# Patient Record
Sex: Female | Born: 1935 | Race: White | Hispanic: No | Smoking: Never smoker
Health system: Southern US, Community
[De-identification: ages and names within clinical notes are randomized; demographics above are authoritative.]

## PROBLEM LIST (undated history)

## (undated) DIAGNOSIS — I4892 Unspecified atrial flutter: Secondary | ICD-10-CM

## (undated) DIAGNOSIS — I10 Essential (primary) hypertension: Secondary | ICD-10-CM

## (undated) DIAGNOSIS — D649 Anemia, unspecified: Secondary | ICD-10-CM

## (undated) DIAGNOSIS — K219 Gastro-esophageal reflux disease without esophagitis: Secondary | ICD-10-CM

## (undated) DIAGNOSIS — E7800 Pure hypercholesterolemia, unspecified: Secondary | ICD-10-CM

## (undated) DIAGNOSIS — I4891 Unspecified atrial fibrillation: Secondary | ICD-10-CM

## (undated) DIAGNOSIS — N6019 Diffuse cystic mastopathy of unspecified breast: Secondary | ICD-10-CM

## (undated) DIAGNOSIS — Z8619 Personal history of other infectious and parasitic diseases: Secondary | ICD-10-CM

## (undated) HISTORY — DX: Anemia, unspecified: D64.9

## (undated) HISTORY — PX: DILATION AND CURETTAGE OF UTERUS: SHX78

## (undated) HISTORY — DX: Personal history of other infectious and parasitic diseases: Z86.19

## (undated) HISTORY — DX: Pure hypercholesterolemia, unspecified: E78.00

## (undated) HISTORY — PX: CATARACT EXTRACTION: SUR2

## (undated) HISTORY — DX: Essential (primary) hypertension: I10

## (undated) HISTORY — PX: BREAST LUMPECTOMY: SHX2

## (undated) HISTORY — DX: Unspecified atrial flutter: I48.92

## (undated) HISTORY — PX: OTHER SURGICAL HISTORY: SHX169

## (undated) HISTORY — DX: Diffuse cystic mastopathy of unspecified breast: N60.19

---

## 2004-08-06 ENCOUNTER — Ambulatory Visit: Payer: Self-pay | Admitting: Internal Medicine

## 2004-10-23 ENCOUNTER — Ambulatory Visit: Payer: Self-pay

## 2004-10-23 LAB — HM COLONOSCOPY

## 2005-08-24 ENCOUNTER — Ambulatory Visit: Payer: Self-pay | Admitting: Internal Medicine

## 2006-09-06 ENCOUNTER — Ambulatory Visit: Payer: Self-pay | Admitting: Internal Medicine

## 2007-06-01 ENCOUNTER — Ambulatory Visit: Payer: Self-pay | Admitting: Internal Medicine

## 2007-10-03 ENCOUNTER — Ambulatory Visit: Payer: Self-pay | Admitting: Internal Medicine

## 2008-10-15 ENCOUNTER — Ambulatory Visit: Payer: Self-pay | Admitting: Internal Medicine

## 2009-10-22 ENCOUNTER — Ambulatory Visit: Payer: Self-pay | Admitting: Internal Medicine

## 2010-11-09 ENCOUNTER — Ambulatory Visit: Payer: Self-pay | Admitting: Internal Medicine

## 2010-11-09 LAB — HM MAMMOGRAPHY

## 2011-10-20 LAB — HM PAP SMEAR: HM Pap smear: NEGATIVE

## 2011-12-01 ENCOUNTER — Ambulatory Visit: Payer: Self-pay | Admitting: Internal Medicine

## 2012-02-07 LAB — BASIC METABOLIC PANEL
BUN: 21 mg/dL (ref 4–21)
Creatinine: 1 mg/dL (ref <=1.1)
Glucose: 95 mg/dL
Potassium: 4.5 mmol/L (ref 3.4–5.3)

## 2012-02-07 LAB — CBC AND DIFFERENTIAL
HCT: 39 % (ref 36–46)
Hemoglobin: 13.2 g/dL (ref 12.0–16.0)
Platelets: 218 10*3/uL (ref 150–399)
WBC: 3.1 10^3/mL

## 2012-02-07 LAB — LIPID PANEL
Cholesterol: 194 mg/dL (ref 0–200)
HDL: 69 mg/dL (ref 35–70)
LDL Cholesterol: 109 mg/dL
Triglycerides: 83 mg/dL (ref 40–160)

## 2012-10-04 HISTORY — PX: BREAST BIOPSY: SHX20

## 2012-10-25 ENCOUNTER — Encounter: Payer: Self-pay | Admitting: *Deleted

## 2012-10-27 ENCOUNTER — Ambulatory Visit (INDEPENDENT_AMBULATORY_CARE_PROVIDER_SITE_OTHER): Payer: Self-pay | Admitting: Internal Medicine

## 2012-10-27 ENCOUNTER — Encounter: Payer: Self-pay | Admitting: Internal Medicine

## 2012-10-27 ENCOUNTER — Other Ambulatory Visit (HOSPITAL_COMMUNITY)
Admission: RE | Admit: 2012-10-27 | Discharge: 2012-10-27 | Disposition: A | Discharge status: Home / Self Care | Payer: Medicare Other | Source: Ambulatory Visit | Attending: Internal Medicine | Admitting: Internal Medicine

## 2012-10-27 VITALS — BP 142/80 | HR 73 | Temp 97.8°F | Ht 66.5 in | Wt 126.0 lb

## 2012-10-27 DIAGNOSIS — G609 Hereditary and idiopathic neuropathy, unspecified: Secondary | ICD-10-CM

## 2012-10-27 DIAGNOSIS — Z01419 Encounter for gynecological examination (general) (routine) without abnormal findings: Secondary | ICD-10-CM | POA: Insufficient documentation

## 2012-10-27 DIAGNOSIS — E7800 Pure hypercholesterolemia, unspecified: Secondary | ICD-10-CM

## 2012-10-27 DIAGNOSIS — Z0000 Encounter for general adult medical examination without abnormal findings: Secondary | ICD-10-CM

## 2012-10-27 DIAGNOSIS — I10 Essential (primary) hypertension: Secondary | ICD-10-CM

## 2012-10-27 DIAGNOSIS — R5383 Other fatigue: Secondary | ICD-10-CM

## 2012-10-27 DIAGNOSIS — Z1151 Encounter for screening for human papillomavirus (HPV): Secondary | ICD-10-CM | POA: Insufficient documentation

## 2012-10-27 DIAGNOSIS — D72819 Decreased white blood cell count, unspecified: Secondary | ICD-10-CM

## 2012-10-27 DIAGNOSIS — R5381 Other malaise: Secondary | ICD-10-CM

## 2012-10-27 DIAGNOSIS — G629 Polyneuropathy, unspecified: Secondary | ICD-10-CM

## 2012-10-27 DIAGNOSIS — Z139 Encounter for screening, unspecified: Secondary | ICD-10-CM

## 2012-10-27 DIAGNOSIS — Z23 Encounter for immunization: Secondary | ICD-10-CM

## 2012-10-27 MED ORDER — TETANUS-DIPHTH-ACELL PERTUSSIS 5-2.5-18.5 LF-MCG/0.5 IM SUSP
0.5000 mL | Freq: Once | INTRAMUSCULAR | Status: AC
  Administered 2012-10-27: 0.5 mL via INTRAMUSCULAR

## 2012-10-29 ENCOUNTER — Encounter: Payer: Self-pay | Admitting: Internal Medicine

## 2012-10-29 DIAGNOSIS — D72819 Decreased white blood cell count, unspecified: Secondary | ICD-10-CM | POA: Insufficient documentation

## 2012-10-29 DIAGNOSIS — I10 Essential (primary) hypertension: Secondary | ICD-10-CM | POA: Insufficient documentation

## 2012-10-29 DIAGNOSIS — E7800 Pure hypercholesterolemia, unspecified: Secondary | ICD-10-CM | POA: Insufficient documentation

## 2012-10-29 NOTE — Assessment & Plan Note (Signed)
Low cholesterol diet and exercise.  Check lipid panel.   

## 2012-10-29 NOTE — Progress Notes (Signed)
Subjective:    Patient ID: Anita Benjamin, female    DOB: [birth year], 79 y.o.   MRN: [id number]  HPI 79 year old female with past history of hypercholesterolemia, FCD and hypertension who comes in today to follow up on these issues as well as for a complete physical exam.  She state she has been doing relatively well.  No cardiac symptoms with increased activity or exertion.  Breathing stable.  States if she stands for any length of time, she will notice some tingling - right top/lateraly aspect of her foot and ankle.  As soon as she moves, the symptoms resolve.  No weakness.  Does not bother her at any other time.  No injury or trauma.  Stays active.  Bowels stable.   Past Medical History  Diagnosis Date  . Hypercholesterolemia   . Hypertension   . Anemia   . Fibrocystic breast disease   . History of chicken pox     Current Outpatient Prescriptions on File Prior to Visit  Medication Sig Dispense Refill  . aspirin 81 MG tablet Take 81 mg by mouth daily.      . Calcium-Magnesium-Vitamin D (CALCIUM 500 PO) Take by mouth daily.      . irbesartan (AVAPRO) 150 MG tablet Take 1/2 pill daily        Review of Systems Patient denies any headache, lightheadedness or dizziness.  No sinus or allergy symptoms.  No chest pain, tightness or palpitations.  No increased shortness of breath, cough or congestion.  No nausea or vomiting.  No acid reflux.  No abdominal pain or cramping.  No bowel change, such as diarrhea, constipation, BRBPR or melana.  No urine change.  No leg pain.  No back pain.  Describes the tingling in her foot as outlined.  Not constant.       Objective:   Physical Exam Filed Vitals:   10/27/12 1105  BP: 142/80  Pulse: 73  Temp: 97.8 F (76.42 C)   79 year old female in no acute distress.   HEENT:  Nares- clear.  Oropharynx - without lesions. NECK:  Supple.  Nontender.  No audible bruit.  HEART:  Appears to be regular. LUNGS:  No crackles or wheezing audible.  Respirations even  and unlabored.  RADIAL PULSE:  Equal bilaterally.    BREASTS:  No nipple discharge or nipple retraction present.  Could not appreciate any distinct nodules or axillary adenopathy.  ABDOMEN:  Soft, nontender.  Bowel sounds present and normal.  No audible abdominal bruit.  GU:  Normal external genitalia.  Vaginal vault without lesions.  Cervix identified.  Pap performed. Could not appreciate any adnexal masses or tenderness.   RECTAL:  Heme negative.   EXTREMITIES:  No increased edema present.  DP pulses palpable and equal bilaterally.      NEURO:  Minimal decreased sensation right top of foot.   MSK:  No muscle weakness.       Assessment & Plan:  FOOT TINGLING.  Exam and symptoms as outlined.  Check B12.  She desires no further w/up or evaluation.  Follow.  Will notify me if any worsening.   PREVIOUS WEIGHT LOSS.  Weight up a couple of pounds today.  Follow.  124 pounds last visit.   DOCUMENTED HISTORY OF ABDOMINAL BRUIT.  Previous aortic ultrasound revealed no significant abnormality.  No aneurysm.  Follow.    GI.  Colonoscopoy 10/31/04 - two polyps removed.  Hyperplastic.  Recommended follow up colonoscopy 10 years.  IFOB today.  FATIGUE.  Did report some fatigue.  Overall doing well.  Check cbc, met c and tsh.   HEALTH MAINTENANCE.  Physical today.  Mammogram 12/01/11 - BiRADS II.  Schedule follow up mammogram.  Colonoscopy as outlined.  Tdap today.

## 2012-10-29 NOTE — Assessment & Plan Note (Signed)
Blood pressure checks outside of here under good control.  Same med regimen.  Check metabolic panel.

## 2012-10-29 NOTE — Assessment & Plan Note (Signed)
Stable.  ?Check cbc.  ?

## 2012-11-08 ENCOUNTER — Telehealth: Payer: Self-pay | Admitting: Internal Medicine

## 2012-11-08 ENCOUNTER — Other Ambulatory Visit (INDEPENDENT_AMBULATORY_CARE_PROVIDER_SITE_OTHER): Payer: 59

## 2012-11-08 DIAGNOSIS — R5381 Other malaise: Secondary | ICD-10-CM

## 2012-11-08 DIAGNOSIS — G609 Hereditary and idiopathic neuropathy, unspecified: Secondary | ICD-10-CM

## 2012-11-08 DIAGNOSIS — R5383 Other fatigue: Secondary | ICD-10-CM

## 2012-11-08 DIAGNOSIS — D72819 Decreased white blood cell count, unspecified: Secondary | ICD-10-CM

## 2012-11-08 DIAGNOSIS — E7800 Pure hypercholesterolemia, unspecified: Secondary | ICD-10-CM

## 2012-11-08 DIAGNOSIS — G629 Polyneuropathy, unspecified: Secondary | ICD-10-CM

## 2012-11-08 LAB — COMPREHENSIVE METABOLIC PANEL
ALT: 16 U/L (ref 0–35)
AST: 22 U/L (ref 0–37)
Albumin: 3.9 g/dL (ref 3.5–5.2)
Alkaline Phosphatase: 46 U/L (ref 39–117)
BUN: 14 mg/dL (ref 6–23)
CO2: 30 mEq/L (ref 19–32)
Calcium: 9.6 mg/dL (ref 8.4–10.5)
Chloride: 106 mEq/L (ref 96–112)
Creatinine, Ser: 1 mg/dL (ref 0.4–1.2)
GFR: 59.24 mL/min — ABNORMAL LOW (ref >60.00)
Glucose, Bld: 97 mg/dL (ref 70–99)
Potassium: 4.8 mEq/L (ref 3.5–5.1)
Sodium: 142 mEq/L (ref 135–145)
Total Bilirubin: 0.8 mg/dL (ref 0.3–1.2)
Total Protein: 6.7 g/dL (ref 6.0–8.3)

## 2012-11-08 LAB — CBC WITH DIFFERENTIAL/PLATELET
Basophils Absolute: 0 10*3/uL (ref 0.0–0.1)
Basophils Relative: 0.6 % (ref 0.0–3.0)
Eosinophils Absolute: 0.2 10*3/uL (ref 0.0–0.7)
Eosinophils Relative: 4.9 % (ref 0.0–5.0)
HCT: 39.4 % (ref 36.0–46.0)
Hemoglobin: 13.4 g/dL (ref 12.0–15.0)
Lymphocytes Relative: 27.7 % (ref 12.0–46.0)
Lymphs Abs: 1 10*3/uL (ref 0.7–4.0)
MCHC: 34 g/dL (ref 30.0–36.0)
MCV: 92.7 fl (ref 78.0–100.0)
Monocytes Absolute: 0.3 10*3/uL (ref 0.1–1.0)
Monocytes Relative: 8.7 % (ref 3.0–12.0)
Neutro Abs: 2.1 10*3/uL (ref 1.4–7.7)
Neutrophils Relative %: 58.1 % (ref 43.0–77.0)
Platelets: 220 10*3/uL (ref 150.0–400.0)
RBC: 4.25 Mil/uL (ref 3.87–5.11)
RDW: 13.4 % (ref 11.5–14.6)
WBC: 3.6 10*3/uL — ABNORMAL LOW (ref 4.5–10.5)

## 2012-11-08 LAB — TSH: TSH: 2.6 u[IU]/mL (ref 0.35–5.50)

## 2012-11-08 LAB — LIPID PANEL
Cholesterol: 197 mg/dL (ref 0–200)
HDL: 75 mg/dL (ref >39.00)
LDL Cholesterol: 106 mg/dL — ABNORMAL HIGH (ref 0–99)
Total CHOL/HDL Ratio: 3
Triglycerides: 78 mg/dL (ref 0.0–149.0)
VLDL: 15.6 mg/dL (ref 0.0–40.0)

## 2012-11-08 LAB — VITAMIN B12: Vitamin B-12: 652 pg/mL (ref 211–911)

## 2012-11-08 NOTE — Telephone Encounter (Signed)
Notify pt that I can work her in tomorrow at 1:30.  She will need to give a urine sample prior to being seen.  May have to wait a few minutes - being worked in.  If unable to wait until tomorrow - then acute care tonight.

## 2012-11-08 NOTE — Telephone Encounter (Signed)
Patient stated she has been feeling urgency and frequency to void but when she does go, she only voids a small amount. Also burns and has blood in her urine, please call patient back to advise.

## 2012-11-08 NOTE — Telephone Encounter (Signed)
Please advise 

## 2012-11-08 NOTE — Telephone Encounter (Signed)
Patient notified

## 2012-11-09 NOTE — Telephone Encounter (Signed)
Pt called back and is saying that her problem has resolved and she no longer needs to come today at 1:30pm. She does thank you for working her in.

## 2012-11-09 NOTE — Telephone Encounter (Signed)
Patient requested copy of labs, mailed labs to patient home address on file.

## 2012-12-01 ENCOUNTER — Ambulatory Visit: Payer: Self-pay | Admitting: Internal Medicine

## 2012-12-06 ENCOUNTER — Ambulatory Visit: Payer: Self-pay | Admitting: Internal Medicine

## 2012-12-07 ENCOUNTER — Telehealth: Payer: Self-pay | Admitting: Internal Medicine

## 2012-12-07 DIAGNOSIS — R928 Other abnormal and inconclusive findings on diagnostic imaging of breast: Secondary | ICD-10-CM

## 2012-12-07 NOTE — Telephone Encounter (Signed)
Please call Kristi over at [place]. Pt has Category 5 mammo and Selena Batten is needing to speak with you. Best number for [employer] [phone]

## 2012-12-10 NOTE — Telephone Encounter (Signed)
Pt notified of mammo results and need for surgery evaluation.  She is agreeable to see surgery.  Will order referral to Dr Lemar Livings.  Pt comfortable with this plan,

## 2012-12-19 ENCOUNTER — Ambulatory Visit (INDEPENDENT_AMBULATORY_CARE_PROVIDER_SITE_OTHER): Payer: 59 | Admitting: General Surgery

## 2012-12-19 DIAGNOSIS — Z789 Other specified health status: Secondary | ICD-10-CM

## 2012-12-19 DIAGNOSIS — Z87898 Personal history of other specified conditions: Secondary | ICD-10-CM

## 2012-12-19 NOTE — Progress Notes (Signed)
Subjective:     Patient ID: Anita Benjamin, female   DOB: 12/20/1933, 79 y.o.   MRN: [id number]  HPI   Review of Systems     Objective:   Physical Exam     Assessment:  Patient here today for follow up breast biopsy.  Dressing removed, steristrip in place and aware it may come off in one week.  Minimal bruising noted.  Aware to use heating pad for comfort as needed.  Aware of pathology. Follow up as scheduled in April.    Plan:

## 2012-12-29 ENCOUNTER — Encounter: Payer: Self-pay | Admitting: Internal Medicine

## 2013-01-16 ENCOUNTER — Ambulatory Visit: Payer: Self-pay | Admitting: General Surgery

## 2013-01-17 ENCOUNTER — Encounter: Payer: Self-pay | Admitting: General Surgery

## 2013-01-18 ENCOUNTER — Encounter: Payer: Self-pay | Admitting: General Surgery

## 2013-01-18 ENCOUNTER — Ambulatory Visit (INDEPENDENT_AMBULATORY_CARE_PROVIDER_SITE_OTHER): Payer: 59 | Admitting: General Surgery

## 2013-01-18 VITALS — BP 144/78 | HR 74 | Resp 14 | Ht 67.0 in | Wt 127.0 lb

## 2013-01-18 DIAGNOSIS — R928 Other abnormal and inconclusive findings on diagnostic imaging of breast: Secondary | ICD-10-CM

## 2013-01-18 DIAGNOSIS — R920 Mammographic microcalcification found on diagnostic imaging of breast: Secondary | ICD-10-CM

## 2013-01-18 NOTE — Progress Notes (Signed)
Patient ID: Anita Benjamin, female   DOB: Oct 25, 1933, 79 y.o.   MRN: [id number]  Chief Complaint  Patient presents with  . Breast Problem    HPI Anita Benjamin is a 79 y.o. female.  Patient here today for follow up mammogram.  No new breast complaints. The patient underwent an ultrasound-guided biopsy in March 2014 having had a mammogram rated category 5 and a ultrasound suggesting common cords with the mammographic lesion. The area was difficult to identify on ultrasound, and a post biopsy mammogram was recently completed. This shows the biopsy clip approximately 1 cm posterior to the index mass, and the mass itself unchanged. As it was a category 5 lesion and benign result, there is discordance of the patient was contacted by phone regarding the advisability of a stereotactic biopsy. She was accompanied by her husband. HPI  Past Medical History  Diagnosis Date  . Hypercholesterolemia   . Hypertension   . Anemia   . Fibrocystic breast disease   . History of chicken pox     Past Surgical History  Procedure Laterality Date  . Laser repair of torn retina      Family History  Problem Relation Age of Onset  . Heart disease Mother 60    Heart attack  . Cancer Father 8    lung cancer  . Breast cancer Paternal Aunt     x3  . Colon cancer Neg Hx     Social History History  Substance Use Topics  . Smoking status: Never Smoker   . Smokeless tobacco: Never Used  . Alcohol Use: No    Allergies  Allergen Reactions  . Oruvail (Ketoprofen)   . Relafen (Nabumetone)   . Timentin (Ticarcillin-Pot Clavulanate)     Current Outpatient Prescriptions  Medication Sig Dispense Refill  . aspirin 81 MG tablet Take 81 mg by mouth daily.      . Calcium-Magnesium-Vitamin D (CALCIUM 500 PO) Take by mouth daily.      . fish oil-omega-3 fatty acids 1000 MG capsule Take 2 g by mouth daily.      . Flaxseed, Linseed, (FLAX SEEDS PO) Take by mouth 2 (two) times daily.      . irbesartan (AVAPRO) 150 MG  tablet Take 1/2 pill daily      . Multiple Vitamin (MULTIVITAMIN) tablet Take 1 tablet by mouth daily.       No current facility-administered medications for this visit.    Review of Systems Review of Systems  Respiratory: Negative.   Cardiovascular: Negative.     Blood pressure 144/78, pulse 74, resp. rate 14, height 5\' 7"  (1.702 m), weight 127 lb (57.607 kg), last menstrual period 10/27/1989.  Physical Exam Physical Exam  Constitutional: She is oriented to person, place, and time. She appears well-developed and well-nourished.  Cardiovascular: Normal rate and regular rhythm.   Pulmonary/Chest: Effort normal and breath sounds normal. Right breast exhibits no inverted nipple, no mass, no nipple discharge, no skin change and no tenderness. Left breast exhibits no inverted nipple, no mass, no nipple discharge, no skin change and no tenderness.  Lymphadenopathy:    She has no cervical adenopathy.    She has no axillary adenopathy.  Neurological: She is alert and oriented to person, place, and time.  Skin: Skin is dry.    Data Reviewed Recently completed right breast mammogram was reviewed showing the biopsy clip posterior to the area of concern.  Assessment    Abnormal right breast mammogram.     Plan  Indication for repeat biopsy was discussed with the patient and her husband. She is vital to proceed. Is not necessary for her to discontinue her aspirin prior to the procedure.     Patient has been scheduled for a right breast stereotactic biopsy at [facility] for 01-22-13 at 4 pm. She is aware of date, time, and instructions.    Earline Mayotte 01/20/2013, 8:41 AM

## 2013-01-18 NOTE — Patient Instructions (Addendum)
Continue to do self breast exams Call for new questions or concerns The stereotactic procedure was reviewed with the patient. The potential for bleeding, infection and pain was reviewed. At this time, the benefits outweigh the risk, and the patient is amenable to proceed.  Patient has been scheduled for a right breast stereotactic biopsy at [facility] for 01-22-13 at 4 pm. She is aware of date, time, and instructions.

## 2013-01-20 ENCOUNTER — Encounter: Payer: Self-pay | Admitting: General Surgery

## 2013-01-20 DIAGNOSIS — R928 Other abnormal and inconclusive findings on diagnostic imaging of breast: Secondary | ICD-10-CM | POA: Insufficient documentation

## 2013-01-22 ENCOUNTER — Encounter: Payer: Self-pay | Admitting: *Deleted

## 2013-01-22 ENCOUNTER — Ambulatory Visit: Payer: Self-pay | Admitting: General Surgery

## 2013-01-22 DIAGNOSIS — R920 Mammographic microcalcification found on diagnostic imaging of breast: Secondary | ICD-10-CM

## 2013-01-23 LAB — PATHOLOGY REPORT

## 2013-01-24 ENCOUNTER — Encounter: Payer: Self-pay | Admitting: General Surgery

## 2013-01-24 ENCOUNTER — Telehealth: Payer: Self-pay | Admitting: General Surgery

## 2013-01-24 ENCOUNTER — Ambulatory Visit: Payer: Self-pay | Admitting: General Surgery

## 2013-01-24 NOTE — Telephone Encounter (Signed)
The patient reported minimal discomfort since her January 22, 2013 stereotactic biopsy for right breast abnormality.  She was notified of the pathology was entirely benign.  She will return next week for wound evaluation with the nurse.  We'll plan for a followup right breast mammogramexamination 6 months.

## 2013-01-24 NOTE — Progress Notes (Signed)
Quick Note:  The patient has been notified that the repeat right breast biopsy completed January 22, 2013 showed benign, atrophic breast tissue with fibrocystic changes, dense stroma and focal fat necrosis. No evidence of malignancy or atypia. I believe that there is concordance between the mammographic abnormality, post biopsy films and the pathology report.  The patient did not recall any history of trauma, but in the area of the upper outer quadrant of the breast as possible this could have occurred and not been so severe as to warrant specific attention to the injury.  The post biopsy mammogram obtained at that time showed the area in question removed on my review the films.  The patient will follow up with the nurse next week as originally scheduled for wound evaluation and will make arrangements for a repeat right mammogram in 6 months to establish a new baseline. ______

## 2013-01-30 ENCOUNTER — Other Ambulatory Visit: Payer: Self-pay | Admitting: *Deleted

## 2013-01-30 DIAGNOSIS — R920 Mammographic microcalcification found on diagnostic imaging of breast: Secondary | ICD-10-CM

## 2013-01-30 NOTE — Progress Notes (Signed)
The patient has been asked to return to the office in six months for a unilateral right breast diagnostic mammogram.

## 2013-01-31 ENCOUNTER — Ambulatory Visit (INDEPENDENT_AMBULATORY_CARE_PROVIDER_SITE_OTHER): Payer: 59 | Admitting: *Deleted

## 2013-01-31 DIAGNOSIS — R920 Mammographic microcalcification found on diagnostic imaging of breast: Secondary | ICD-10-CM

## 2013-01-31 NOTE — Patient Instructions (Addendum)
The patient is aware that a heating pad may be used for comfort as needed.  Aware of pathology. Follow up as scheduled 6 months.

## 2013-01-31 NOTE — Progress Notes (Signed)
Patient here today for follow up post right breast stero biopsy.  Steristrip in place and aware it may come off in one week.  Moderate bruising noted.  The patient is aware that a heating pad may be used for comfort as needed.  Aware of pathology. Follow up as scheduled.

## 2013-02-13 ENCOUNTER — Other Ambulatory Visit: Payer: Self-pay | Admitting: *Deleted

## 2013-02-13 MED ORDER — IRBESARTAN 150 MG PO TABS: 75.0000 mg | ORAL_TABLET | Freq: Every day | ORAL | Status: DC

## 2013-02-14 ENCOUNTER — Encounter: Payer: Self-pay | Admitting: General Surgery

## 2013-04-24 ENCOUNTER — Encounter: Payer: Self-pay | Admitting: Internal Medicine

## 2013-04-24 ENCOUNTER — Ambulatory Visit (INDEPENDENT_AMBULATORY_CARE_PROVIDER_SITE_OTHER): Payer: 59 | Admitting: Internal Medicine

## 2013-04-24 VITALS — BP 120/70 | HR 68 | Temp 98.5°F | Ht 67.0 in | Wt 126.8 lb

## 2013-04-24 DIAGNOSIS — D72819 Decreased white blood cell count, unspecified: Secondary | ICD-10-CM

## 2013-04-24 DIAGNOSIS — R928 Other abnormal and inconclusive findings on diagnostic imaging of breast: Secondary | ICD-10-CM

## 2013-04-24 DIAGNOSIS — E7800 Pure hypercholesterolemia, unspecified: Secondary | ICD-10-CM

## 2013-04-24 DIAGNOSIS — I10 Essential (primary) hypertension: Secondary | ICD-10-CM

## 2013-04-24 LAB — CBC WITH DIFFERENTIAL/PLATELET
Basophils Absolute: 0 10*3/uL (ref 0.0–0.1)
Basophils Relative: 0.9 % (ref 0.0–3.0)
Eosinophils Absolute: 0.2 10*3/uL (ref 0.0–0.7)
Eosinophils Relative: 3.7 % (ref 0.0–5.0)
HCT: 40.8 % (ref 36.0–46.0)
Hemoglobin: 13.8 g/dL (ref 12.0–15.0)
Lymphocytes Relative: 30.3 % (ref 12.0–46.0)
Lymphs Abs: 1.4 10*3/uL (ref 0.7–4.0)
MCHC: 33.9 g/dL (ref 30.0–36.0)
MCV: 94.3 fl (ref 78.0–100.0)
Monocytes Absolute: 0.4 10*3/uL (ref 0.1–1.0)
Monocytes Relative: 9.6 % (ref 3.0–12.0)
Neutro Abs: 2.5 10*3/uL (ref 1.4–7.7)
Neutrophils Relative %: 55.5 % (ref 43.0–77.0)
Platelets: 237 10*3/uL (ref 150.0–400.0)
RBC: 4.33 Mil/uL (ref 3.87–5.11)
RDW: 13.5 % (ref 11.5–14.6)
WBC: 4.5 10*3/uL (ref 4.5–10.5)

## 2013-04-24 NOTE — Progress Notes (Signed)
  Subjective:    Patient ID: Anita Benjamin, female    DOB: 1933/12/27, 79 y.o.   MRN: [id number]  HPI 79 year old female with past history of hypercholesterolemia, FCD and hypertension who comes in today for a scheduled follow up.  She state she has been doing relatively well.  No cardiac symptoms with increased activity or exertion.  Breathing stable.  Stays active.  No cardiac symptoms with increased activity or exertion.  Bowels stable.   Past Medical History  Diagnosis Date  . Hypercholesterolemia   . Hypertension   . Anemia   . Fibrocystic breast disease   . History of chicken pox     Current Outpatient Prescriptions on File Prior to Visit  Medication Sig Dispense Refill  . aspirin 81 MG tablet Take 81 mg by mouth daily.      . Calcium-Magnesium-Vitamin D (CALCIUM 500 PO) Take by mouth daily.      . fish oil-omega-3 fatty acids 1000 MG capsule Take 2 g by mouth daily.      . Flaxseed, Linseed, (FLAX SEEDS PO) Take by mouth 2 (two) times daily.      . irbesartan (AVAPRO) 150 MG tablet Take 0.5 tablets (75 mg total) by mouth daily.  30 tablet  2  . Multiple Vitamin (MULTIVITAMIN) tablet Take 1 tablet by mouth daily.       No current facility-administered medications on file prior to visit.    Review of Systems Patient denies any headache, lightheadedness or dizziness.  No sinus or allergy symptoms.  No chest pain, tightness or palpitations.  No increased shortness of breath, cough or congestion.  No nausea or vomiting.  No acid reflux.  No abdominal pain or cramping.  No bowel change, such as diarrhea, constipation, BRBPR or melana.  No urine change.      Objective:   Physical Exam  Filed Vitals:   04/24/13 0903  BP: 120/70  Pulse: 68  Temp: 98.5 F (42.20 C)   79 year old female in no acute distress.   HEENT:  Nares- clear.  Oropharynx - without lesions. NECK:  Supple.  Nontender.  No audible bruit.  HEART:  Appears to be regular. LUNGS:  No crackles or wheezing audible.   Respirations even and unlabored.  RADIAL PULSE:  Equal bilaterally.  ABDOMEN:  Soft, nontender.  Bowel sounds present and normal.  No audible abdominal bruit.   EXTREMITIES:  No increased edema present.  DP pulses palpable and equal bilaterally.        Assessment & Plan:  FOOT TINGLING.  Previously reported.  Stable.  Desires no further testing or intervention.    PREVIOUS WEIGHT LOSS.  Weight stable.    DOCUMENTED HISTORY OF ABDOMINAL BRUIT.  Previous aortic ultrasound revealed no significant abnormality.  No aneurysm.  Follow.    GI.  Colonoscopoy 10/31/04 - two polyps removed.  Hyperplastic.  Recommended follow up colonoscopy 10 years.  HEALTH MAINTENANCE.  Physical 10/27/12.  Mammogram 12/01/12 recommended f/u views.  F/u mammogram 12/06/12 - Birads 5.  Referred to Dr Lemar Livings.  S/p biopsy.  Benign.  Recommended f/u in 10/14.  Colonoscopy as outlined.

## 2013-04-26 ENCOUNTER — Telehealth: Payer: Self-pay | Admitting: Internal Medicine

## 2013-04-26 ENCOUNTER — Encounter: Payer: Self-pay | Admitting: Internal Medicine

## 2013-04-26 MED ORDER — IRBESARTAN 150 MG PO TABS: 75.0000 mg | ORAL_TABLET | Freq: Every day | ORAL | Status: DC

## 2013-04-26 NOTE — Assessment & Plan Note (Signed)
Had a category 5 mammogram.  Saw Dr Lemar Livings.  S/p biopsy.  Benign.  Planning to follow up with Dr Lemar Livings in 10/14.

## 2013-04-26 NOTE — Telephone Encounter (Signed)
rx sent in for avapro #90 with one refill

## 2013-04-26 NOTE — Assessment & Plan Note (Signed)
Stable.  ?Check cbc.  ?

## 2013-04-26 NOTE — Assessment & Plan Note (Signed)
Low cholesterol diet and exercise.  Follow lipid panel.   

## 2013-04-26 NOTE — Assessment & Plan Note (Signed)
Blood pressure stable.  Same med regimen.  Follow metabolic panel.   

## 2013-05-07 ENCOUNTER — Ambulatory Visit: Payer: Self-pay | Admitting: Ophthalmology

## 2013-05-14 ENCOUNTER — Ambulatory Visit: Payer: Self-pay | Admitting: Ophthalmology

## 2013-05-18 ENCOUNTER — Encounter: Payer: Self-pay | Admitting: Internal Medicine

## 2013-06-25 ENCOUNTER — Ambulatory Visit: Payer: Self-pay | Admitting: Ophthalmology

## 2013-06-27 ENCOUNTER — Ambulatory Visit (INDEPENDENT_AMBULATORY_CARE_PROVIDER_SITE_OTHER): Payer: Medicare Other | Admitting: *Deleted

## 2013-06-27 DIAGNOSIS — Z23 Encounter for immunization: Secondary | ICD-10-CM

## 2013-07-12 ENCOUNTER — Encounter: Payer: Self-pay | Admitting: General Surgery

## 2013-07-12 ENCOUNTER — Ambulatory Visit: Payer: Self-pay | Admitting: General Surgery

## 2013-07-31 ENCOUNTER — Ambulatory Visit (INDEPENDENT_AMBULATORY_CARE_PROVIDER_SITE_OTHER): Payer: 59 | Admitting: General Surgery

## 2013-07-31 ENCOUNTER — Encounter: Payer: Self-pay | Admitting: General Surgery

## 2013-07-31 VITALS — BP 110/64 | HR 72 | Resp 12 | Ht 67.0 in | Wt 125.0 lb

## 2013-07-31 DIAGNOSIS — R928 Other abnormal and inconclusive findings on diagnostic imaging of breast: Secondary | ICD-10-CM

## 2013-07-31 NOTE — Progress Notes (Signed)
Patient ID: Anita Benjamin, female   DOB: Dec 20, 1933, 79 y.o.   MRN: [id number]  Chief Complaint  Patient presents with  . Follow-up    mammogram    HPI Anita Benjamin is a 79 y.o. female who presents for a breast evaluation. The most recent right breast mammogram was done on 07/12/13 cat1 .  Patient does perform regular self breast checks and gets regular mammograms done.    HPI  Past Medical History  Diagnosis Date  . Hypercholesterolemia   . Hypertension   . Anemia   . Fibrocystic breast disease   . History of chicken pox     Past Surgical History  Procedure Laterality Date  . Laser repair of torn retina      Family History  Problem Relation Age of Onset  . Heart disease Mother 46    Heart attack  . Cancer Father 39    lung cancer  . Breast cancer Paternal Aunt     x3  . Colon cancer Neg Hx     Social History History  Substance Use Topics  . Smoking status: Never Smoker   . Smokeless tobacco: Never Used  . Alcohol Use: No    Allergies  Allergen Reactions  . Oruvail [Ketoprofen]   . Relafen [Nabumetone]   . Timentin [Ticarcillin-Pot Clavulanate]     Current Outpatient Prescriptions  Medication Sig Dispense Refill  . aspirin 81 MG tablet Take 81 mg by mouth daily.      . Calcium-Magnesium-Vitamin D (CALCIUM 500 PO) Take by mouth daily.      . fish oil-omega-3 fatty acids 1000 MG capsule Take 2 g by mouth daily.      . Flaxseed, Linseed, (FLAX SEEDS PO) Take by mouth 2 (two) times daily.      . irbesartan (AVAPRO) 150 MG tablet Take 0.5 tablets (75 mg total) by mouth daily.  90 tablet  1  . Multiple Vitamin (MULTIVITAMIN) tablet Take 1 tablet by mouth daily.       No current facility-administered medications for this visit.    Review of Systems Review of Systems  Constitutional: Negative.   Respiratory: Negative.   Cardiovascular: Negative.     Blood pressure 110/64, pulse 72, resp. rate 12, height 5\' 7"  (1.702 m), weight 125 lb (56.7 kg), last menstrual  period 10/27/1989.  Physical Exam Physical Exam  Constitutional: She is oriented to person, place, and time. She appears well-developed and well-nourished.  Eyes: No scleral icterus.  Cardiovascular: Normal rate, regular rhythm and normal heart sounds.   Pulmonary/Chest: Breath sounds normal. Right breast exhibits no inverted nipple, no mass, no nipple discharge, no skin change and no tenderness. Left breast exhibits no inverted nipple, no mass, no nipple discharge, no skin change and no tenderness. Breasts are asymmetrical (left breast than right).  Lymphadenopathy:    She has no cervical adenopathy.    She has no axillary adenopathy.  Neurological: She is alert and oriented to person, place, and time.  Skin: Skin is warm and dry.    Data Reviewed Right breast mammogram dated 07/04/2013 showed biopsy clip in the area of mammographic concern from her March 2014 biopsy.  The right breast biopsy dated December 12, 2012 showed only fibroadipose tissue with benign atrophic epithelium and sclerosing adenosis. No evidence of atypia or malignancy.   Assessment    Benign breast exam.     Plan    The patient will resume annual screening mammograms in spring 2015 with Dale Rossmoyne, M.D.  Earline Mayotte 07/31/2013, 8:37 PM

## 2013-07-31 NOTE — Patient Instructions (Addendum)
Patient to return as needed. 

## 2013-08-01 ENCOUNTER — Telehealth: Payer: Self-pay | Admitting: *Deleted

## 2013-08-01 NOTE — Telephone Encounter (Signed)
Pt received a bill for the Tdap inj & another injection that was given in January. She states that [facility] Medicare said that it should have been billed under her Rx plan. I notified the patient that only her pharmacy can bill for injections under her Rx plan. I asked if she checked her insurance prior to getting the Tdap & she states that she did not. Just an [place]

## 2013-08-01 NOTE — Telephone Encounter (Signed)
Any suggestions to help this pt - Anita Benjamin and I were not sure what to do.

## 2013-08-23 ENCOUNTER — Telehealth: Payer: Self-pay | Admitting: Internal Medicine

## 2013-08-23 NOTE — Telephone Encounter (Signed)
I spoke with patient and she is aware that if she pays for this out of pocket to send her insurance company that covers her RX along with the EOB that they should reimburse payment.

## 2013-08-23 NOTE — Telephone Encounter (Signed)
This is in reference to phone note on 08/01/13 that was closed. I sent an email to our billing team and they state the patient needs to do the following: I will call and advise patient.  From: Melvenia Beam [[email or web address]]  Sent: Tuesday, August 07, 2013 2:14 PM To: Cydney Ok Subject: RE: bill  IN this case the patient has an RX plan to cover certain immunizations/ drugs.  Typically the patient is to pay out of pocket for these services, then they are to supply the RX coverage with the EOB and statement( or the proper material to the RX Coverage) and the RX coverage will reimburse the patient.

## 2013-10-30 ENCOUNTER — Ambulatory Visit (INDEPENDENT_AMBULATORY_CARE_PROVIDER_SITE_OTHER): Payer: 59 | Admitting: Internal Medicine

## 2013-10-30 ENCOUNTER — Encounter: Payer: Self-pay | Admitting: Internal Medicine

## 2013-10-30 VITALS — BP 122/80 | HR 69 | Temp 97.9°F | Ht 67.0 in | Wt 126.5 lb

## 2013-10-30 DIAGNOSIS — D72819 Decreased white blood cell count, unspecified: Secondary | ICD-10-CM

## 2013-10-30 DIAGNOSIS — Z1211 Encounter for screening for malignant neoplasm of colon: Secondary | ICD-10-CM

## 2013-10-30 DIAGNOSIS — I10 Essential (primary) hypertension: Secondary | ICD-10-CM

## 2013-10-30 DIAGNOSIS — E7800 Pure hypercholesterolemia, unspecified: Secondary | ICD-10-CM

## 2013-10-30 DIAGNOSIS — R928 Other abnormal and inconclusive findings on diagnostic imaging of breast: Secondary | ICD-10-CM

## 2013-10-30 DIAGNOSIS — Z1239 Encounter for other screening for malignant neoplasm of breast: Secondary | ICD-10-CM

## 2013-10-30 NOTE — Progress Notes (Signed)
Pre-visit discussion using our clinic review tool. No additional management support is needed unless otherwise documented below in the visit note.  

## 2013-10-31 ENCOUNTER — Encounter: Payer: Self-pay | Admitting: Emergency Medicine

## 2013-11-03 ENCOUNTER — Encounter: Payer: Self-pay | Admitting: Internal Medicine

## 2013-11-03 NOTE — Assessment & Plan Note (Signed)
Stable.  ?Check cbc.  ?

## 2013-11-03 NOTE — Assessment & Plan Note (Signed)
Blood pressure stable.  Same med regimen.  Follow metabolic panel.   

## 2013-11-03 NOTE — Assessment & Plan Note (Signed)
Low cholesterol diet and exercise.  Follow lipid panel.   

## 2013-11-03 NOTE — Progress Notes (Signed)
Subjective:    Patient ID: Anita Benjamin, female    DOB: 07/18/1934, 80 y.o.   MRN: [id number]  HPI 80 year old female with past history of hypercholesterolemia, FCD and hypertension who comes in today to follow up on these issues as well as for a complete physical exam.   She state she has been doing relatively well.  No cardiac symptoms with increased activity or exertion.  Breathing stable.  Stays active.  No cardiac symptoms with increased activity or exertion.  Bowels stable.  Overall feels good.     Past Medical History  Diagnosis Date  . Hypercholesterolemia   . Hypertension   . Anemia   . Fibrocystic breast disease   . History of chicken pox     Current Outpatient Prescriptions on File Prior to Visit  Medication Sig Dispense Refill  . aspirin 81 MG tablet Take 81 mg by mouth daily.      . Calcium-Magnesium-Vitamin D (CALCIUM 500 PO) Take by mouth daily.      . fish oil-omega-3 fatty acids 1000 MG capsule Take 2 g by mouth daily.      . Flaxseed, Linseed, (FLAX SEEDS PO) Take by mouth 2 (two) times daily.      . irbesartan (AVAPRO) 150 MG tablet Take 0.5 tablets (75 mg total) by mouth daily.  90 tablet  1  . Multiple Vitamin (MULTIVITAMIN) tablet Take 1 tablet by mouth daily.       No current facility-administered medications on file prior to visit.    Review of Systems Patient denies any headache, lightheadedness or dizziness.  No sinus or allergy symptoms.  No chest pain, tightness or palpitations.  No increased shortness of breath, cough or congestion.  No nausea or vomiting.  No acid reflux.  No abdominal pain or cramping.  No bowel change, such as diarrhea, constipation, BRBPR or melana.  No urine change.  Overall feels good.       Objective:   Physical Exam  Filed Vitals:   10/30/13 1055  BP: 122/80  Pulse: 69  Temp: 97.9 F (35.13 C)   80 year old female in no acute distress.   HEENT:  Nares- clear.  Oropharynx - without lesions. NECK:  Supple.  Nontender.  No  audible bruit.  HEART:  Appears to be regular. LUNGS:  No crackles or wheezing audible.  Respirations even and unlabored.  RADIAL PULSE:  Equal bilaterally.    BREASTS:  No nipple discharge or nipple retraction present.  Could not appreciate any distinct nodules or axillary adenopathy.  ABDOMEN:  Soft, nontender.  Bowel sounds present and normal.  No audible abdominal bruit.  GU:  Not performed.    EXTREMITIES:  No increased edema present.  DP pulses palpable and equal bilaterally.          Assessment & Plan:  PREVIOUS WEIGHT LOSS.  Weight stable.    DOCUMENTED HISTORY OF ABDOMINAL BRUIT.  Previous aortic ultrasound revealed no significant abnormality.  No aneurysm.  Follow.    GI.  Colonoscopoy 10/31/04 - two polyps removed.  Hyperplastic.  Recommended follow up colonoscopy 10 years.  HEALTH MAINTENANCE.  Physical today.  Pap 10/27/12 negative with negative HPV.  Mammogram 12/01/12 recommended f/u views.  F/u mammogram 12/06/12 - Birads 5.  Referred to Dr Bary Castilla.  S/p biopsy.  Benign.  Recommended f/u in 10/14.  Follow up right breast mammo - Birads I.  Recommended f/u mammogram 2/15.  Schedule. Colonoscopy as outlined.   IFOB given.

## 2013-11-03 NOTE — Assessment & Plan Note (Signed)
Had a category 5 mammogram.  Saw Dr Bary Castilla.  S/p biopsy.  Benign.  Had follow up with Dr Bary Castilla in 10/14.  Right mammogram - Birads I.  Schedule bilateral mammogram in 2/15.

## 2013-11-06 ENCOUNTER — Other Ambulatory Visit (INDEPENDENT_AMBULATORY_CARE_PROVIDER_SITE_OTHER): Payer: Medicare Other

## 2013-11-06 ENCOUNTER — Telehealth: Payer: Self-pay | Admitting: *Deleted

## 2013-11-06 DIAGNOSIS — I10 Essential (primary) hypertension: Secondary | ICD-10-CM

## 2013-11-06 DIAGNOSIS — E7800 Pure hypercholesterolemia, unspecified: Secondary | ICD-10-CM

## 2013-11-06 DIAGNOSIS — D72819 Decreased white blood cell count, unspecified: Secondary | ICD-10-CM

## 2013-11-06 LAB — CBC WITH DIFFERENTIAL/PLATELET
Basophils Absolute: 0 10*3/uL (ref 0.0–0.1)
Basophils Relative: 0.8 % (ref 0.0–3.0)
Eosinophils Absolute: 0.1 10*3/uL (ref 0.0–0.7)
Eosinophils Relative: 2.9 % (ref 0.0–5.0)
HCT: 41.1 % (ref 36.0–46.0)
Hemoglobin: 13.6 g/dL (ref 12.0–15.0)
Lymphocytes Relative: 26.6 % (ref 12.0–46.0)
Lymphs Abs: 1 10*3/uL (ref 0.7–4.0)
MCHC: 33 g/dL (ref 30.0–36.0)
MCV: 95.9 fl (ref 78.0–100.0)
Monocytes Absolute: 0.3 10*3/uL (ref 0.1–1.0)
Monocytes Relative: 7 % (ref 3.0–12.0)
Neutro Abs: 2.4 10*3/uL (ref 1.4–7.7)
Neutrophils Relative %: 62.7 % (ref 43.0–77.0)
Platelets: 230 10*3/uL (ref 150.0–400.0)
RBC: 4.28 Mil/uL (ref 3.87–5.11)
RDW: 13.8 % (ref 11.5–14.6)
WBC: 3.9 10*3/uL — ABNORMAL LOW (ref 4.5–10.5)

## 2013-11-06 LAB — COMPREHENSIVE METABOLIC PANEL
ALT: 18 U/L (ref 0–35)
AST: 20 U/L (ref 0–37)
Albumin: 3.9 g/dL (ref 3.5–5.2)
Alkaline Phosphatase: 52 U/L (ref 39–117)
BUN: 15 mg/dL (ref 6–23)
CO2: 27 mEq/L (ref 19–32)
Calcium: 9.4 mg/dL (ref 8.4–10.5)
Chloride: 107 mEq/L (ref 96–112)
Creatinine, Ser: 0.9 mg/dL (ref 0.4–1.2)
GFR: 67.89 mL/min (ref >60.00)
Glucose, Bld: 90 mg/dL (ref 70–99)
Potassium: 4.1 mEq/L (ref 3.5–5.1)
Sodium: 141 mEq/L (ref 135–145)
Total Bilirubin: 0.8 mg/dL (ref 0.3–1.2)
Total Protein: 6.4 g/dL (ref 6.0–8.3)

## 2013-11-06 LAB — LIPID PANEL
Cholesterol: 211 mg/dL — ABNORMAL HIGH (ref 0–200)
HDL: 70.8 mg/dL (ref >39.00)
Total CHOL/HDL Ratio: 3
Triglycerides: 92 mg/dL (ref 0.0–149.0)
VLDL: 18.4 mg/dL (ref 0.0–40.0)

## 2013-11-06 LAB — TSH: TSH: 2.44 u[IU]/mL (ref 0.35–5.50)

## 2013-11-06 LAB — LDL CHOLESTEROL, DIRECT: Direct LDL: 117.7 mg/dL

## 2013-11-06 NOTE — Telephone Encounter (Signed)
Patient would like a copy of her lab results when they come back.

## 2013-11-07 ENCOUNTER — Encounter: Payer: Self-pay | Admitting: *Deleted

## 2013-11-07 NOTE — Telephone Encounter (Signed)
Labs mailed

## 2013-12-04 ENCOUNTER — Ambulatory Visit: Payer: Self-pay | Admitting: Internal Medicine

## 2013-12-04 LAB — HM MAMMOGRAPHY

## 2013-12-05 ENCOUNTER — Encounter: Payer: Self-pay | Admitting: Internal Medicine

## 2013-12-11 ENCOUNTER — Telehealth: Payer: Self-pay | Admitting: General Surgery

## 2013-12-11 NOTE — Telephone Encounter (Signed)
Yes, she should.

## 2013-12-11 NOTE — Telephone Encounter (Signed)
PATIENT CALLED STATED SHE HAD HER MAMMO(ORDERED BY DR Mullica [place] SCOTT/ANNUAL)ON 12-04-13 @ [facility] .THEY HAVE SCH'D HER FOR ADDED VIEWS (RT) ON 12-18-13.SHE WOULD LIKE TO KNOW IF SHE SHOULD HAVE THE ADDED VIEWS?

## 2013-12-18 ENCOUNTER — Ambulatory Visit: Payer: Self-pay | Admitting: Internal Medicine

## 2013-12-18 LAB — HM MAMMOGRAPHY

## 2013-12-19 ENCOUNTER — Encounter: Payer: Self-pay | Admitting: Internal Medicine

## 2013-12-27 ENCOUNTER — Other Ambulatory Visit (INDEPENDENT_AMBULATORY_CARE_PROVIDER_SITE_OTHER): Payer: 59

## 2013-12-27 DIAGNOSIS — Z1211 Encounter for screening for malignant neoplasm of colon: Secondary | ICD-10-CM

## 2013-12-27 LAB — FECAL OCCULT BLOOD, IMMUNOCHEMICAL: Fecal Occult Bld: NEGATIVE

## 2013-12-28 ENCOUNTER — Encounter: Payer: Self-pay | Admitting: *Deleted

## 2014-01-07 ENCOUNTER — Encounter: Payer: Self-pay | Admitting: Internal Medicine

## 2014-01-07 DIAGNOSIS — R928 Other abnormal and inconclusive findings on diagnostic imaging of breast: Secondary | ICD-10-CM

## 2014-03-04 ENCOUNTER — Encounter: Payer: Self-pay | Admitting: *Deleted

## 2014-03-04 ENCOUNTER — Encounter: Payer: Self-pay | Admitting: Internal Medicine

## 2014-03-04 ENCOUNTER — Ambulatory Visit (INDEPENDENT_AMBULATORY_CARE_PROVIDER_SITE_OTHER): Payer: Medicare Other | Admitting: Internal Medicine

## 2014-03-04 VITALS — BP 110/70 | HR 59 | Temp 98.5°F | Ht 67.0 in | Wt 125.8 lb

## 2014-03-04 DIAGNOSIS — D72819 Decreased white blood cell count, unspecified: Secondary | ICD-10-CM

## 2014-03-04 DIAGNOSIS — R432 Parageusia: Secondary | ICD-10-CM

## 2014-03-04 DIAGNOSIS — I10 Essential (primary) hypertension: Secondary | ICD-10-CM

## 2014-03-04 DIAGNOSIS — R928 Other abnormal and inconclusive findings on diagnostic imaging of breast: Secondary | ICD-10-CM

## 2014-03-04 DIAGNOSIS — Z86010 Personal history of colonic polyps: Secondary | ICD-10-CM

## 2014-03-04 DIAGNOSIS — R439 Unspecified disturbances of smell and taste: Secondary | ICD-10-CM

## 2014-03-04 DIAGNOSIS — E7800 Pure hypercholesterolemia, unspecified: Secondary | ICD-10-CM

## 2014-03-04 LAB — BASIC METABOLIC PANEL
BUN: 18 mg/dL (ref 6–23)
CO2: 30 mEq/L (ref 19–32)
Calcium: 10.1 mg/dL (ref 8.4–10.5)
Chloride: 104 mEq/L (ref 96–112)
Creatinine, Ser: 0.9 mg/dL (ref 0.4–1.2)
GFR: 61.21 mL/min (ref >60.00)
Glucose, Bld: 91 mg/dL (ref 70–99)
Potassium: 4.8 mEq/L (ref 3.5–5.1)
Sodium: 140 mEq/L (ref 135–145)

## 2014-03-04 MED ORDER — IRBESARTAN 150 MG PO TABS: 75.0000 mg | ORAL_TABLET | Freq: Every day | ORAL | Status: DC

## 2014-03-04 NOTE — Progress Notes (Signed)
Pre visit review using our clinic review tool, if applicable. No additional management support is needed unless otherwise documented below in the visit note. 

## 2014-03-09 ENCOUNTER — Encounter: Payer: Self-pay | Admitting: Internal Medicine

## 2014-03-09 DIAGNOSIS — R432 Parageusia: Secondary | ICD-10-CM | POA: Insufficient documentation

## 2014-03-09 DIAGNOSIS — Z86010 Personal history of colonic polyps: Secondary | ICD-10-CM | POA: Insufficient documentation

## 2014-03-09 NOTE — Assessment & Plan Note (Signed)
No sinus issues.  Wants salty foods.  Declines any further testing, evaluation or ENT referral.

## 2014-03-09 NOTE — Progress Notes (Signed)
  Subjective:    Patient ID: Anita Benjamin, female    DOB: [birth year], 80 y.o.   MRN: [id number]  HPI 80 year old female with past history of hypercholesterolemia, FCD and hypertension who comes in today for a scheduled follow up.  She state she has been doing relatively well.  No cardiac symptoms with increased activity or exertion.  Breathing stable.  Stays active.   Bowels stable.  Overall feels good.  She has noticed some decreased taste sensation.  Wants more salty foods.  No sinus issues.  No headache.  No dizziness.  No reflux.     Past Medical History  Diagnosis Date  . Hypercholesterolemia   . Hypertension   . Anemia   . Fibrocystic breast disease   . History of chicken pox     Current Outpatient Prescriptions on File Prior to Visit  Medication Sig Dispense Refill  . aspirin 81 MG tablet Take 81 mg by mouth daily.      . Calcium-Magnesium-Vitamin D (CALCIUM 500 PO) Take by mouth daily.      . fish oil-omega-3 fatty acids 1000 MG capsule Take 2 g by mouth daily.      . Flaxseed, Linseed, (FLAX SEEDS PO) Take by mouth 2 (two) times daily.      . Multiple Vitamin (MULTIVITAMIN) tablet Take 1 tablet by mouth daily.       No current facility-administered medications on file prior to visit.    Review of Systems Patient denies any headache, lightheadedness or dizziness.  No sinus or allergy symptoms.  No chest pain, tightness or palpitations.  No increased shortness of breath, cough or congestion.  No nausea or vomiting.  No acid reflux.  No abdominal pain or cramping.  No bowel change, such as diarrhea, constipation, BRBPR or melana.  No urine change.  Overall feels good.  Some change in taste as outlined.       Objective:   Physical Exam  Filed Vitals:   03/04/14 0929  BP: 110/70  Pulse: 59  Temp: 98.5 F (36.9 C)   Blood pressure recheck:  66/32  80 year old female in no acute distress.   HEENT:  Nares- clear.  Oropharynx - without lesions. NECK:  Supple.  Nontender.   No audible bruit.  HEART:  Appears to be regular. LUNGS:  No crackles or wheezing audible.  Respirations even and unlabored.  RADIAL PULSE:  Equal bilaterally.   ABDOMEN:  Soft, nontender.  Bowel sounds present and normal.  No audible abdominal bruit.    EXTREMITIES:  No increased edema present.  DP pulses palpable and equal bilaterally.          Assessment & Plan:  PREVIOUS WEIGHT LOSS.  Weight stable.  Follow.    DOCUMENTED HISTORY OF ABDOMINAL BRUIT.  Previous aortic ultrasound revealed no significant abnormality.  No aneurysm.  Follow.    GI.  Colonoscopoy 10/31/04 - two polyps removed.  Hyperplastic.  Recommended follow up colonoscopy 10 years.  HEALTH MAINTENANCE.  Physical 10/30/13.  Pap 10/27/12 negative with negative HPV.  Mammogram 12/01/12 recommended f/u views.  F/u mammogram 12/06/12 - Birads 5.  Referred to Dr Bary Castilla.  S/p biopsy.  Benign.  Recommended f/u in 10/14.  Follow up right breast mammo - Birads I.  Recommended f/u mammogram 2/15.  Mammogram 12/04/13 recommended f/u views.   Mammogram 12/18/13 (f/u views) - Birads II.  Colonoscopy as outlined.   IFOB 12/27/13 - negative.

## 2014-03-09 NOTE — Assessment & Plan Note (Signed)
Mammogram results as outlined.

## 2014-03-09 NOTE — Assessment & Plan Note (Signed)
Stable.  Follow cbc.  

## 2014-03-09 NOTE — Assessment & Plan Note (Signed)
Low cholesterol diet and exercise.  Follow lipid panel.   

## 2014-03-09 NOTE — Assessment & Plan Note (Signed)
Blood pressure stable.  Same med regimen.  Follow metabolic panel.

## 2014-03-09 NOTE — Assessment & Plan Note (Signed)
Colonoscopy 1/06 - hyperplastic polyps.  Recommended f/u in 10 years.

## 2014-07-04 ENCOUNTER — Encounter: Payer: Self-pay | Admitting: Internal Medicine

## 2014-07-04 ENCOUNTER — Ambulatory Visit (INDEPENDENT_AMBULATORY_CARE_PROVIDER_SITE_OTHER): Payer: Medicare Other | Admitting: Internal Medicine

## 2014-07-04 VITALS — BP 120/70 | HR 72 | Temp 98.4°F | Ht 67.0 in | Wt 124.2 lb

## 2014-07-04 DIAGNOSIS — D72819 Decreased white blood cell count, unspecified: Secondary | ICD-10-CM

## 2014-07-04 DIAGNOSIS — I10 Essential (primary) hypertension: Secondary | ICD-10-CM

## 2014-07-04 DIAGNOSIS — E7800 Pure hypercholesterolemia, unspecified: Secondary | ICD-10-CM

## 2014-07-04 DIAGNOSIS — R928 Other abnormal and inconclusive findings on diagnostic imaging of breast: Secondary | ICD-10-CM

## 2014-07-04 DIAGNOSIS — Z86010 Personal history of colonic polyps: Secondary | ICD-10-CM

## 2014-07-04 DIAGNOSIS — Z23 Encounter for immunization: Secondary | ICD-10-CM

## 2014-07-04 LAB — COMPREHENSIVE METABOLIC PANEL
ALT: 18 U/L (ref 0–35)
AST: 20 U/L (ref 0–37)
Albumin: 4.3 g/dL (ref 3.5–5.2)
Alkaline Phosphatase: 55 U/L (ref 39–117)
BUN: 17 mg/dL (ref 6–23)
CO2: 31 mEq/L (ref 19–32)
Calcium: 10.2 mg/dL (ref 8.4–10.5)
Chloride: 103 mEq/L (ref 96–112)
Creatinine, Ser: 1 mg/dL (ref 0.4–1.2)
GFR: 59.69 mL/min — ABNORMAL LOW (ref >60.00)
Glucose, Bld: 83 mg/dL (ref 70–99)
Potassium: 5.2 mEq/L — ABNORMAL HIGH (ref 3.5–5.1)
Sodium: 140 mEq/L (ref 135–145)
Total Bilirubin: 0.6 mg/dL (ref 0.2–1.2)
Total Protein: 7.4 g/dL (ref 6.0–8.3)

## 2014-07-04 LAB — CBC WITH DIFFERENTIAL/PLATELET
Basophils Absolute: 0 10*3/uL (ref 0.0–0.1)
Basophils Relative: 0.7 % (ref 0.0–3.0)
Eosinophils Absolute: 0.1 10*3/uL (ref 0.0–0.7)
Eosinophils Relative: 2.1 % (ref 0.0–5.0)
HCT: 41.8 % (ref 36.0–46.0)
Hemoglobin: 14.2 g/dL (ref 12.0–15.0)
Lymphocytes Relative: 25.9 % (ref 12.0–46.0)
Lymphs Abs: 1.2 10*3/uL (ref 0.7–4.0)
MCHC: 34 g/dL (ref 30.0–36.0)
MCV: 94.1 fl (ref 78.0–100.0)
Monocytes Absolute: 0.5 10*3/uL (ref 0.1–1.0)
Monocytes Relative: 11.5 % (ref 3.0–12.0)
Neutro Abs: 2.7 10*3/uL (ref 1.4–7.7)
Neutrophils Relative %: 59.8 % (ref 43.0–77.0)
Platelets: 258 10*3/uL (ref 150.0–400.0)
RBC: 4.44 Mil/uL (ref 3.87–5.11)
RDW: 13.5 % (ref 11.5–15.5)
WBC: 4.6 10*3/uL (ref 4.0–10.5)

## 2014-07-04 NOTE — Progress Notes (Signed)
Subjective:    Patient ID: Anita Benjamin, female    DOB: 1934/05/21, 80 y.o.   MRN: [id number]  HPI 80 year old female with past history of hypercholesterolemia, FCD and hypertension who comes in today for a scheduled follow up.  She state she has been doing relatively well.  Has been under increased stress recently.  Her husband has been diagnosed with lung cancer.  He has mood changes and attitude changes.  Gets angry with her very easily.  There is a nurse coming out to their home.  This is helping.  Has good support from her children.  We discussed this at length today.  She does not feel she needs any further intervention at this time.  She is eating.  Sleeping varies with his issues.  No cardiac symptoms with increased activity or exertion.  Breathing stable.  Stays active.   Bowels stable.     Past Medical History  Diagnosis Date  . Hypercholesterolemia   . Hypertension   . Anemia   . Fibrocystic breast disease   . History of chicken pox     Current Outpatient Prescriptions on File Prior to Visit  Medication Sig Dispense Refill  . aspirin 81 MG tablet Take 81 mg by mouth daily.      . Calcium-Magnesium-Vitamin D (CALCIUM 500 PO) Take by mouth daily.      . fish oil-omega-3 fatty acids 1000 MG capsule Take 1 g by mouth daily.       . Flaxseed, Linseed, (FLAX SEEDS PO) Take by mouth daily.       . irbesartan (AVAPRO) 150 MG tablet Take 0.5 tablets (75 mg total) by mouth daily.  90 tablet  1  . Multiple Vitamin (MULTIVITAMIN) tablet Take 1 tablet by mouth daily.       No current facility-administered medications on file prior to visit.    Review of Systems Patient denies any headache, lightheadedness or dizziness.  No sinus or allergy symptoms.  No chest pain, tightness or palpitations.  No increased shortness of breath, cough or congestion.  No nausea or vomiting.  No acid reflux.  No abdominal pain or cramping.  No bowel change, such as diarrhea, constipation, BRBPR or melana.  No  urine change.  Overall feels good physically.  Increased stress as outlined.  Blood pressure averaging 110-120/70s.      Objective:   Physical Exam  Filed Vitals:   07/04/14 0901  BP: 120/70  Pulse: 72  Temp: 98.4 F (36.9 C)   Blood pressure recheck:  24/80  80 year old female in no acute distress.   HEENT:  Nares- clear.  Oropharynx - without lesions. NECK:  Supple.  Nontender.  No audible bruit.  HEART:  Appears to be regular. LUNGS:  No crackles or wheezing audible.  Respirations even and unlabored.  RADIAL PULSE:  Equal bilaterally.   ABDOMEN:  Soft, nontender.  Bowel sounds present and normal.  No audible abdominal bruit.    EXTREMITIES:  No increased edema present.  DP pulses palpable and equal bilaterally.          Assessment & Plan:  PREVIOUS WEIGHT LOSS.  Weight stable.  Follow.    DOCUMENTED HISTORY OF ABDOMINAL BRUIT.  Previous aortic ultrasound revealed no significant abnormality.  No aneurysm.  Follow.    GI.  Colonoscopoy 10/31/04 - two polyps removed.  Hyperplastic.  Recommended follow up colonoscopy 10 years.  HEALTH MAINTENANCE.  Physical 10/30/13.  Pap 10/27/12 negative with negative HPV.  Mammogram  12/01/12 recommended f/u views.  F/u mammogram 12/06/12 - Birads 5.  Referred to Dr Bary Castilla.  S/p biopsy.  Benign.  Recommended f/u in 10/14.  Follow up right breast mammo - Birads I.  Recommended f/u mammogram 2/15.  Mammogram 12/04/13 recommended f/u views.   Mammogram 12/18/13 (f/u views) - Birads II.  Recommended f/u mammogram in one year.  Colonoscopy as outlined.   IFOB 12/27/13 - negative.

## 2014-07-04 NOTE — Progress Notes (Signed)
Pre visit review using our clinic review tool, if applicable. No additional management support is needed unless otherwise documented below in the visit note. 

## 2014-07-05 ENCOUNTER — Other Ambulatory Visit: Payer: Self-pay | Admitting: Internal Medicine

## 2014-07-05 DIAGNOSIS — E875 Hyperkalemia: Secondary | ICD-10-CM

## 2014-07-05 NOTE — Progress Notes (Signed)
Order placed for f/u potassium check.  

## 2014-07-07 ENCOUNTER — Encounter: Payer: Self-pay | Admitting: Internal Medicine

## 2014-07-07 NOTE — Assessment & Plan Note (Signed)
Blood pressure stable.  Same med regimen.  Follow metabolic panel.

## 2014-07-07 NOTE — Assessment & Plan Note (Signed)
Mammogram results as outlined.  Due f/u mammogram 12/2014.

## 2014-07-07 NOTE — Assessment & Plan Note (Signed)
Low cholesterol diet and exercise.  Follow lipid panel.   

## 2014-07-07 NOTE — Assessment & Plan Note (Signed)
Stable.  Follow cbc.  

## 2014-07-07 NOTE — Assessment & Plan Note (Signed)
Colonoscopy 1/06 - hyperplastic polyps.  Recommended f/u in 10 years.

## 2014-07-12 ENCOUNTER — Other Ambulatory Visit (INDEPENDENT_AMBULATORY_CARE_PROVIDER_SITE_OTHER): Payer: Medicare Other

## 2014-07-12 ENCOUNTER — Telehealth: Payer: Self-pay | Admitting: *Deleted

## 2014-07-12 DIAGNOSIS — E875 Hyperkalemia: Secondary | ICD-10-CM

## 2014-07-12 LAB — POTASSIUM: Potassium: 5.3 mEq/L — ABNORMAL HIGH (ref 3.5–5.1)

## 2014-07-12 NOTE — Telephone Encounter (Signed)
The patient called back and has appointment information.

## 2014-07-12 NOTE — Telephone Encounter (Signed)
Please call pt and inform her that with the increased heart rate and low blood pressure, needs to be seen.  If they are over at the cancer center, I recommend acute care or ER - today.

## 2014-07-12 NOTE — Telephone Encounter (Signed)
Pt came in for labs and wanted you to know that yesterday morning pt felt light headed and heart was racing, she told her BP 87/65 and pulse was 153 and that the same thing happen after dinner BP 92/60 pulse 154, asking pt if she wanted an appt to be seen she no that she had to take her husband to the cancer center, but would like a call [phone](home) or [phone](cell)

## 2014-07-12 NOTE — Telephone Encounter (Signed)
Appt scheduled

## 2014-07-12 NOTE — Telephone Encounter (Signed)
Left message for pt to return my call, notified of appt time. Requested call back to confirm she can make that appt date/time

## 2014-07-12 NOTE — Telephone Encounter (Signed)
Pt declines to be seen today. States the episode is short and has only occurred once prior to yesterday, a couple weeks ago. States she feels fine today. Advised that she needs to be seen in ED or UC when the episode is occurring for evaluation. Notified pt I would make Dr. Nicki Reaper aware.

## 2014-07-12 NOTE — Telephone Encounter (Signed)
See if she can come in at 1:30 on 07/16/14 for me to see her.  Block 30 min spot

## 2014-07-16 ENCOUNTER — Encounter: Payer: Self-pay | Admitting: Internal Medicine

## 2014-07-16 ENCOUNTER — Ambulatory Visit: Payer: Self-pay | Admitting: Internal Medicine

## 2014-07-16 ENCOUNTER — Ambulatory Visit (INDEPENDENT_AMBULATORY_CARE_PROVIDER_SITE_OTHER): Payer: Medicare Other | Admitting: Internal Medicine

## 2014-07-16 VITALS — BP 110/70 | HR 71 | Temp 98.2°F | Ht 67.0 in | Wt 125.2 lb

## 2014-07-16 DIAGNOSIS — E875 Hyperkalemia: Secondary | ICD-10-CM

## 2014-07-16 DIAGNOSIS — I959 Hypotension, unspecified: Secondary | ICD-10-CM

## 2014-07-16 DIAGNOSIS — I10 Essential (primary) hypertension: Secondary | ICD-10-CM

## 2014-07-16 DIAGNOSIS — E7800 Pure hypercholesterolemia, unspecified: Secondary | ICD-10-CM

## 2014-07-16 DIAGNOSIS — R000 Tachycardia, unspecified: Secondary | ICD-10-CM

## 2014-07-16 LAB — POTASSIUM: Potassium: 3.9 mmol/L (ref 3.5–5.1)

## 2014-07-16 LAB — TSH: Thyroid Stimulating Horm: 2.5 u[IU]/mL

## 2014-07-16 NOTE — Progress Notes (Signed)
Pre visit review using our clinic review tool, if applicable. No additional management support is needed unless otherwise documented below in the visit note. 

## 2014-07-17 ENCOUNTER — Telehealth: Payer: Self-pay | Admitting: Internal Medicine

## 2014-07-17 ENCOUNTER — Encounter: Payer: Self-pay | Admitting: Internal Medicine

## 2014-07-17 DIAGNOSIS — E875 Hyperkalemia: Secondary | ICD-10-CM | POA: Insufficient documentation

## 2014-07-17 DIAGNOSIS — R000 Tachycardia, unspecified: Secondary | ICD-10-CM | POA: Insufficient documentation

## 2014-07-17 NOTE — Progress Notes (Signed)
Subjective:    Patient ID: Anita Benjamin, female    DOB: [birth year], 80 y.o.   MRN: [id number]  HPI 80 year old female with past history of hypercholesterolemia, FCD and hypertension who comes in today as a work in with concerns regarding increased heart rate and low blood pressure.  She has had previuos intermittent episodes of increased heart rate.  States that five days ago, she had been cleaning and leaning over/moving things.  States she noticed increased heart rate.  Blood pressure decreased with increased heart rate.  (blood pressure 75/57 pulse 158).  States she felt like she was going to pass out.  The episode lasted for 1.5 hours.  That evening had similar episode which lasted approximately two hours.  As heart rate improve, blood pressure returned to her baseline.  No episodes since last week.  States in September, she had a similar episode that lasted 20-30 minutes.  She stays active.  Does notice this occurs more when she is more active.  She state she has been doing relatively well otherwise.   Has been under increased stress recently.  Her husband has been diagnosed with lung cancer.  He has mood changes and attitude changes.  Gets angry with her very easily.  She is eating.  Breathing stable.  Stays active.   Bowels stable.     Past Medical History  Diagnosis Date  . Hypercholesterolemia   . Hypertension   . Anemia   . Fibrocystic breast disease   . History of chicken pox     Current Outpatient Prescriptions on File Prior to Visit  Medication Sig Dispense Refill  . aspirin 81 MG tablet Take 81 mg by mouth daily.      . Calcium-Magnesium-Vitamin D (CALCIUM 500 PO) Take by mouth daily.      . fish oil-omega-3 fatty acids 1000 MG capsule Take 1 g by mouth daily.       . Flaxseed, Linseed, (FLAX SEEDS PO) Take by mouth daily.       . irbesartan (AVAPRO) 150 MG tablet Take 0.5 tablets (75 mg total) by mouth daily.  90 tablet  1  . Multiple Vitamin (MULTIVITAMIN) tablet Take 1 tablet  by mouth daily.       No current facility-administered medications on file prior to visit.    Review of Systems Patient denies any headache, lightheadedness or dizziness. Did feel like she was gong to pass out with the above episodes.   No sinus or allergy symptoms.  No chest pain or tightness.  Increased heart rate as outlined.  Intermittent episodes.  No increased shortness of breath, cough or congestion.  No nausea or vomiting.  No acid reflux.  No abdominal pain or cramping.  No bowel change, such as diarrhea.  Overall feels good physically.  Increased stress as outlined.  Blood pressure normally averaging 110-120/70s.      Objective:   Physical Exam  Filed Vitals:   07/16/14 1332  BP: 110/70  Pulse: 71  Temp: 98.2 F (36.8 C)   Blood pressure recheck:  48/-3740  80 year old female in no acute distress.   HEENT:  Nares- clear.  Oropharynx - without lesions. NECK:  Supple.  Nontender.  No audible bruit.  HEART:  Appears to be regular. LUNGS:  No crackles or wheezing audible.  Respirations even and unlabored.  RADIAL PULSE:  Equal bilaterally.   ABDOMEN:  Soft, nontender.  Bowel sounds present and normal.  No audible abdominal bruit.  EXTREMITIES:  No increased edema present.  DP pulses palpable and equal bilaterally.          Assessment & Plan:  DOCUMENTED HISTORY OF ABDOMINAL BRUIT.  Previous aortic ultrasound revealed no significant abnormality.  No aneurysm.  Follow.    HEALTH MAINTENANCE.  Physical 10/30/13.  Pap 10/27/12 negative with negative HPV.  Mammogram 12/01/12 recommended f/u views.  F/u mammogram 12/06/12 - Birads 5.  Referred to Dr Bary Castilla.  S/p biopsy.  Benign.  Recommended f/u in 10/14.  Follow up right breast mammo - Birads I.  Recommended f/u mammogram 2/15.  Mammogram 12/04/13 recommended f/u views.   Mammogram 12/18/13 (f/u views) - Birads II.  Recommended f/u mammogram in one year.  Colonoscopy as outlined.   IFOB 12/27/13 - negative.      Problem List Items  Addressed This Visit   Hypercholesterolemia     Low cholesterol diet and exercise.  Follow lipid panel.      Hyperkalemia     Potassium slightly elevated.  Recheck today.  Gave her information on foods with increased potasium.      Hypertension     Blood pressure as outlined.  Has been under reasonable control.  Always higher in the office.  Continue avapro as she is doing.  Follow pressure.  Hold on medication changes until cardiology reviews.      Tachycardia - Primary     Recent episodes of tachycardia associated with hypotension.  EKG today revealed SR with TWI in aVR, aVL and v2.  No acute ischemic changes.  Discussed further cardiac w/up.  She was in agreement.  Will refer to cardiology for further evaluation and treatment.  Hold on medication changes.  May need echo, stress testing and possible holter monitor.  Await cardiology recommendations.      Relevant Orders      Ambulatory referral to Cardiology    Other Visit Diagnoses   Increased heart rate        Relevant Orders       EKG 12-Lead (Completed)    Hypotension, unspecified hypotension type        Relevant Orders       Ambulatory referral to Cardiology

## 2014-07-17 NOTE — Telephone Encounter (Signed)
Please call and notify pt her potassium is wnl.  She went to hospital to have drawn stat.

## 2014-07-17 NOTE — Assessment & Plan Note (Signed)
Blood pressure as outlined.  Has been under reasonable control.  Always higher in the office.  Continue avapro as she is doing.  Follow pressure.  Hold on medication changes until cardiology reviews.

## 2014-07-17 NOTE — Assessment & Plan Note (Signed)
Recent episodes of tachycardia associated with hypotension.  EKG today revealed SR with TWI in aVR, aVL and v2.  No acute ischemic changes.  Discussed further cardiac w/up.  She was in agreement.  Will refer to cardiology for further evaluation and treatment.  Hold on medication changes.  May need echo, stress testing and possible holter monitor.  Await cardiology recommendations.

## 2014-07-17 NOTE — Assessment & Plan Note (Signed)
Low cholesterol diet and exercise.  Follow lipid panel.   

## 2014-07-17 NOTE — Assessment & Plan Note (Addendum)
Potassium slightly elevated.  Recheck today.  Gave her information on foods with increased potasium.

## 2014-07-18 NOTE — Telephone Encounter (Signed)
Called and advised patient of results

## 2014-07-31 ENCOUNTER — Encounter: Payer: Self-pay | Admitting: Cardiovascular Disease

## 2014-07-31 ENCOUNTER — Ambulatory Visit (INDEPENDENT_AMBULATORY_CARE_PROVIDER_SITE_OTHER): Payer: Medicare Other | Admitting: Cardiovascular Disease

## 2014-07-31 ENCOUNTER — Encounter: Payer: Self-pay | Admitting: Internal Medicine

## 2014-07-31 VITALS — BP 120/70 | HR 78 | Ht 67.0 in | Wt 126.5 lb

## 2014-07-31 DIAGNOSIS — I10 Essential (primary) hypertension: Secondary | ICD-10-CM

## 2014-07-31 DIAGNOSIS — E7800 Pure hypercholesterolemia, unspecified: Secondary | ICD-10-CM

## 2014-07-31 DIAGNOSIS — R000 Tachycardia, unspecified: Secondary | ICD-10-CM

## 2014-07-31 MED ORDER — PROPRANOLOL HCL 20 MG PO TABS: 20.0000 mg | ORAL_TABLET | Freq: Three times a day (TID) | ORAL | Status: DC | PRN

## 2014-07-31 MED ORDER — DILTIAZEM HCL 30 MG PO TABS: 30.0000 mg | ORAL_TABLET | Freq: Three times a day (TID) | ORAL | Status: DC | PRN

## 2014-07-31 NOTE — Assessment & Plan Note (Signed)
Blood pressure is well controlled on today's visit. No changes made to the medications. 

## 2014-07-31 NOTE — Assessment & Plan Note (Signed)
Recommended diet, exercise, consider oatmeal, red yeast rice for her lipids

## 2014-07-31 NOTE — Patient Instructions (Addendum)
Your next appointment will be scheduled in our new office located at :  [place] [place] [facility]  [address], [place],  [address]   You are have a heart arrhythmia We will order a heart monitor for up to 30 days  Please take diltiazem and propranolol as needed for tachycardia  Look for Red Yeast Rice for cholesterol  Please call us if you have new issues that need to be addressed before your next appt.  Your physician wants you to follow-up in: 5 to 6 weeks

## 2014-07-31 NOTE — Assessment & Plan Note (Addendum)
Records were reviewed. Long discussion concerning her symptoms over the past several years . Symptoms concerning for paroxysmal arrhythmia. Unable to exclude atrial fibrillation versus SVT. Heart rate was very elevated, less likely atrial tachycardia. She has become more symptomatic this year. We will order a 30 day event monitor an effort to identify her arrhythmia. We have given her a prescription for propranolol 20 mg when necessary and diltiazem 30 mg when necessary for prolonged arrhythmia.  Further management based on the event monitor results. We will try to obtain her prior echocardiogram and stress test. EKG and clinical exam is essentially benign

## 2014-07-31 NOTE — Progress Notes (Signed)
Patient ID: Anita Benjamin, female    DOB: Feb 22, 1934, 80 y.o.   MRN: [id number]  HPI Comments: Anita Benjamin is a very pleasant 80 year old woman, patient of Dr. Nicki Reaper, who presents for evaluation of tachycardia, hypotension. She reports several year history, possibly even up to 10 years of tachyarrhythmia.  She reports that symptoms were previously very rare, now becoming more frequent. She describes acute onset of tachycardia, sometimes associated with dizziness. Heart rate up to 150 beats per minute or higher. Symptoms will terminate relatively acutely.  Heart rate monitored by blood pressure cuff and pulse oximetry at home. She has these as she is measuring her husband's numbers. She had an episode last night, 2 episodes last week. One episode lasted for 1-1/2 hours. She lay down until symptoms resolved. She denies having previous Holter or event monitor. She feels that she has had less than 10 episodes this year, probably less than 5 episodes on other years  She is concerned that stress may be contributing to her symptoms. She has broken sleep, husband has lung cancer, underlying COPD. She is taking care of him  In terms of her family history, father had lung cancer, possible heart issues, died at age 52. Mother had a stroke, died at 87  In terms of her social history, she is not a smoker with no smoking history, lives at home with her husband. Is retired  Total cholesterol 211  EKG shows normal sinus rhythm with rate 79 beats a minute, no significant ST or T wave changes   Outpatient Encounter Prescriptions as of 07/31/2014  Medication Sig  . Ascorbic Acid (VITAMIN C) 500 MG CAPS Take 500 mg by mouth daily.  Marland Kitchen aspirin 81 MG tablet Take 81 mg by mouth daily.  . Calcium-Magnesium-Vitamin D (CALCIUM 500 PO) Take by mouth daily.  . fish oil-omega-3 fatty acids 1000 MG capsule Take 1 g by mouth daily.   . Flaxseed, Linseed, (FLAX SEEDS PO) Take by mouth daily.   . irbesartan (AVAPRO) 150  MG tablet Take 0.5 tablets (75 mg total) by mouth daily.  . Multiple Vitamin (MULTIVITAMIN) tablet Take 1 tablet by mouth daily.  . Selenium (SELENICAPS-200 PO) Take 200 mg by mouth daily.  Marland Kitchen diltiazem (CARDIZEM) 30 MG tablet Take 1 tablet (30 mg total) by mouth 3 (three) times daily as needed.  . propranolol (INDERAL) 20 MG tablet Take 1 tablet (20 mg total) by mouth 3 (three) times daily as needed.     Review of Systems  Constitutional: Negative.   HENT: Negative.   Eyes: Negative.   Respiratory: Negative.   Cardiovascular: Negative.   Gastrointestinal: Negative.   Endocrine: Negative.   Musculoskeletal: Negative.   Skin: Negative.   Allergic/Immunologic: Negative.   Neurological: Negative.   Hematological: Negative.   Psychiatric/Behavioral: Negative.   All other systems reviewed and are negative.   BP 120/70  Pulse 78  Ht 5\' 7"  (1.702 m)  Wt 126 lb 8 oz (57.38 kg)  BMI 19.81 kg/m2  LMP 10/27/1989  Physical Exam  Nursing note and vitals reviewed. Constitutional: She is oriented to person, place, and time. She appears well-developed and well-nourished.  HENT:  Head: Normocephalic.  Nose: Nose normal.  Mouth/Throat: Oropharynx is clear and moist.  Eyes: Conjunctivae are normal. Pupils are equal, round, and reactive to light.  Neck: Normal range of motion. Neck supple. No JVD present.  Cardiovascular: Normal rate, regular rhythm, S1 normal, S2 normal, normal heart sounds and intact distal pulses.  Exam reveals  no gallop and no friction rub.   No murmur heard. Pulmonary/Chest: Effort normal and breath sounds normal. No respiratory distress. She has no wheezes. She has no rales. She exhibits no tenderness.  Abdominal: Soft. Bowel sounds are normal. She exhibits no distension. There is no tenderness.  Musculoskeletal: Normal range of motion. She exhibits no edema and no tenderness.  Lymphadenopathy:    She has no cervical adenopathy.  Neurological: She is alert and  oriented to person, place, and time. Coordination normal.  Skin: Skin is warm and dry. No rash noted. No erythema.  Psychiatric: She has a normal mood and affect. Her behavior is normal. Judgment and thought content normal.    Assessment and Plan

## 2014-09-04 ENCOUNTER — Ambulatory Visit: Payer: Medicare Other | Admitting: Cardiovascular Disease

## 2014-09-12 ENCOUNTER — Ambulatory Visit: Payer: Medicare Other | Admitting: Cardiovascular Disease

## 2014-09-12 ENCOUNTER — Telehealth: Payer: Self-pay | Admitting: Cardiovascular Disease

## 2014-09-12 NOTE — Telephone Encounter (Signed)
Recent 30 day monitor showing run of SVT lasting for several hours on 08/03/2014, rate up to 170 beats per minute. Also episode of sinus tachycardia with rate up to 130 bpm on 08/18/2014  As she has a long history of tachycardia, symptoms consistent with SVT, Recommend we hold her irbesartan Start diltiazem CD 120 g daily Needs follow-up in the clinic after she has been on the new medication for several weeks

## 2014-09-13 MED ORDER — DILTIAZEM HCL ER COATED BEADS 120 MG PO CP24: 120.0000 mg | ORAL_CAPSULE | Freq: Every day | ORAL | Status: DC

## 2014-09-13 NOTE — Telephone Encounter (Signed)
Spoke w/ pt.  Advised her of Dr. Donivan Scull recommendation. She verbalizes understanding and is agreeable to plan.  Pt reports recent stressors, as her husband was placed in Hospice care while she was wearing the monitor and he passed away this past 08-Jan-2023.  Pt is tearful but states that she has a strong support network and her family has been with her.  Asked her to call back if we can be of any assistance or if she has any questions.

## 2014-09-13 NOTE — Addendum Note (Signed)
Addended by: Dede Query R on: 09/13/2014 08:26 AM   Modules accepted: Orders, Medications

## 2014-09-19 ENCOUNTER — Other Ambulatory Visit: Payer: Self-pay

## 2014-09-19 ENCOUNTER — Encounter (INDEPENDENT_AMBULATORY_CARE_PROVIDER_SITE_OTHER): Payer: Medicare Other

## 2014-09-19 DIAGNOSIS — R000 Tachycardia, unspecified: Secondary | ICD-10-CM

## 2014-10-01 NOTE — Telephone Encounter (Signed)
This encounter was created in error - please disregard.

## 2014-10-08 ENCOUNTER — Ambulatory Visit: Payer: Medicare Other | Admitting: Cardiovascular Disease

## 2014-10-09 ENCOUNTER — Ambulatory Visit (INDEPENDENT_AMBULATORY_CARE_PROVIDER_SITE_OTHER): Payer: Medicare Other | Admitting: Nurse Practitioner

## 2014-10-09 ENCOUNTER — Encounter: Payer: Self-pay | Admitting: Nurse Practitioner

## 2014-10-09 VITALS — BP 155/70 | HR 70 | Temp 98.1°F | Resp 12 | Ht 67.0 in | Wt 124.8 lb

## 2014-10-09 DIAGNOSIS — N390 Urinary tract infection, site not specified: Secondary | ICD-10-CM

## 2014-10-09 DIAGNOSIS — R319 Hematuria, unspecified: Secondary | ICD-10-CM

## 2014-10-09 DIAGNOSIS — Z139 Encounter for screening, unspecified: Secondary | ICD-10-CM

## 2014-10-09 DIAGNOSIS — Z1389 Encounter for screening for other disorder: Secondary | ICD-10-CM

## 2014-10-09 LAB — POCT URINALYSIS DIPSTICK
Bilirubin, UA: NEGATIVE
Blood, UA: NEGATIVE
Glucose, UA: NEGATIVE
Ketones, UA: NEGATIVE
Leukocytes, UA: NEGATIVE
Nitrite, UA: NEGATIVE
Protein, UA: NEGATIVE
Spec Grav, UA: <=1.005
Urobilinogen, UA: 0.2
pH, UA: 5.5

## 2014-10-09 NOTE — Patient Instructions (Signed)
Follow up as needed.  We will contact you with the rest of your results.  Call us if you need [place] in the mean time and continue to follow up with Dr. Rockey Situ on Monday.

## 2014-10-09 NOTE — Progress Notes (Signed)
Subjective:    Patient ID: Anita Benjamin, female    DOB: Mar 26, 1934, 81 y.o.   MRN: [id number]  HPI  Anita Benjamin is a 81 yo female with a CC of hematuria x 1 day.   1) On 10/07/13 patient reports dysuria, frequency, hematuria on tissue and in toilet with low back pain equal on both sides. Patient reports she drank a lot of water and ate cranberries then had relief. She reports no symptoms or hematuria today. This is the 2nd episode of 1 day symptoms in a year. She lost her husband and there is a lot of stress in her life. She seems to feel this may be stress related, but states she does not drink enough fluids. She is not sleeping well and also complains of a "jittery stomach".   Review of Systems  Constitutional: Negative for fever, chills, diaphoresis, appetite change and fatigue.  Gastrointestinal: Negative for nausea, vomiting and diarrhea.  Genitourinary: Positive for dysuria, frequency, hematuria and flank pain.  Skin: Negative for rash.   Past Medical History  Diagnosis Date  . Hypercholesterolemia   . Hypertension   . Anemia   . Fibrocystic breast disease   . History of chicken pox     History   Social History  . Marital Status: Married    Spouse Name: N/A    Number of Children: 2  . Years of Education: N/A   Occupational History  . Not on file.   Social History Main Topics  . Smoking status: Never Smoker   . Smokeless tobacco: Never Used  . Alcohol Use: No  . Drug Use: No  . Sexual Activity: Not on file   Other Topics Concern  . Not on file   Social History Narrative    Past Surgical History  Procedure Laterality Date  . Laser repair of torn retina      Family History  Problem Relation Age of Onset  . Heart disease Mother 2    Heart attack  . Cancer Father 68    lung cancer  . Breast cancer Paternal Aunt     x3  . Colon cancer Neg Hx     Allergies  Allergen Reactions  . Oruvail [Ketoprofen]   . Relafen [Nabumetone]   . Timentin  [Ticarcillin-Pot Clavulanate]     Current Outpatient Prescriptions on File Prior to Visit  Medication Sig Dispense Refill  . Ascorbic Acid (VITAMIN C) 500 MG CAPS Take 500 mg by mouth daily.    Marland Kitchen aspirin 81 MG tablet Take 81 mg by mouth daily.    . Calcium-Magnesium-Vitamin D (CALCIUM 500 PO) Take by mouth daily.    Marland Kitchen diltiazem (CARDIZEM CD) 120 MG 24 hr capsule Take 1 capsule (120 mg total) by mouth daily. 30 capsule 6  . diltiazem (CARDIZEM) 30 MG tablet Take 1 tablet (30 mg total) by mouth 3 (three) times daily as needed. 90 tablet 3  . fish oil-omega-3 fatty acids 1000 MG capsule Take 1 g by mouth daily.     . Flaxseed, Linseed, (FLAX SEEDS PO) Take by mouth daily.     . Multiple Vitamin (MULTIVITAMIN) tablet Take 1 tablet by mouth daily.    . propranolol (INDERAL) 20 MG tablet Take 1 tablet (20 mg total) by mouth 3 (three) times daily as needed. 90 tablet 1  . Selenium (SELENICAPS-200 PO) Take 200 mg by mouth daily.     No current facility-administered medications on file prior to visit.  Objective:   Physical Exam  Constitutional: She is oriented to person, place, and time. She appears well-developed and well-nourished. No distress.  HENT:  Head: Normocephalic and atraumatic.  Cardiovascular: Normal rate and regular rhythm.   Pulmonary/Chest: Effort normal and breath sounds normal.  Abdominal: Soft. Bowel sounds are normal. She exhibits no distension and no mass. There is no tenderness. There is no rebound and no guarding.  Neurological: She is alert and oriented to person, place, and time.  Skin: Skin is warm and dry. No rash noted. She is not diaphoretic.  Psychiatric: She has a normal mood and affect. Her behavior is normal. Judgment and thought content normal.   BP 155/70 mmHg  Pulse 70  Temp(Src) 98.1 F (36.7 C) (Oral)  Resp 12  Ht 5\' 7"  (1.702 m)  Wt 124 lb 12.8 oz (56.609 kg)  BMI 19.54 kg/m2  SpO2 96%  LMP 10/27/1989     Assessment & Plan:

## 2014-10-09 NOTE — Progress Notes (Signed)
Pre visit review using our clinic review tool, if applicable. No additional management support is needed unless otherwise documented below in the visit note. 

## 2014-10-10 ENCOUNTER — Other Ambulatory Visit: Payer: Self-pay | Admitting: Nurse Practitioner

## 2014-10-10 LAB — URINALYSIS, ROUTINE W REFLEX MICROSCOPIC
Bilirubin Urine: NEGATIVE
Hgb urine dipstick: NEGATIVE
Ketones, ur: NEGATIVE
Nitrite: NEGATIVE
Specific Gravity, Urine: 1.01 (ref 1.000–1.030)
Total Protein, Urine: NEGATIVE
Urine Glucose: NEGATIVE
Urobilinogen, UA: 0.2 (ref 0.0–1.0)
pH: 6.5 (ref 5.0–8.0)

## 2014-10-10 MED ORDER — SULFAMETHOXAZOLE-TRIMETHOPRIM 800-160 MG PO TABS: 1.0000 | ORAL_TABLET | Freq: Two times a day (BID) | ORAL | Status: DC

## 2014-10-11 DIAGNOSIS — N390 Urinary tract infection, site not specified: Secondary | ICD-10-CM | POA: Insufficient documentation

## 2014-10-11 NOTE — Assessment & Plan Note (Signed)
Stable. Hematuria x 1 day. POCT urine ordered with negative results. Microscopy came back with elevated WBC and Leukocytes. Probable infection. Bactrim DS twice daily x 3 days called into pharmacy. Pt notified by LPN. Will FU prn worsening/failure to improve/ recurrent hematuria episode.

## 2014-10-14 ENCOUNTER — Encounter: Payer: Self-pay | Admitting: Cardiovascular Disease

## 2014-10-14 ENCOUNTER — Ambulatory Visit (INDEPENDENT_AMBULATORY_CARE_PROVIDER_SITE_OTHER): Payer: Medicare Other | Admitting: Cardiovascular Disease

## 2014-10-14 VITALS — BP 138/80 | HR 80 | Ht 67.0 in | Wt 124.2 lb

## 2014-10-14 DIAGNOSIS — F4329 Adjustment disorder with other symptoms: Secondary | ICD-10-CM

## 2014-10-14 DIAGNOSIS — I10 Essential (primary) hypertension: Secondary | ICD-10-CM

## 2014-10-14 DIAGNOSIS — E7800 Pure hypercholesterolemia, unspecified: Secondary | ICD-10-CM

## 2014-10-14 DIAGNOSIS — R000 Tachycardia, unspecified: Secondary | ICD-10-CM

## 2014-10-14 DIAGNOSIS — F4320 Adjustment disorder, unspecified: Secondary | ICD-10-CM | POA: Insufficient documentation

## 2014-10-14 DIAGNOSIS — N390 Urinary tract infection, site not specified: Secondary | ICD-10-CM

## 2014-10-14 MED ORDER — METOPROLOL SUCCINATE ER 50 MG PO TB24: 50.0000 mg | ORAL_TABLET | Freq: Every day | ORAL | Status: DC

## 2014-10-14 NOTE — Assessment & Plan Note (Signed)
Symptoms consistent with SVT with rate 150 bpm, unable to exclude atrial tachycardia Symptoms are better on diltiazem but continues to have breakthrough. We will add metolazone succinate 50 mg in the evening. Diltiazem in the morning.

## 2014-10-14 NOTE — Patient Instructions (Signed)
Please take the diltiazem 120 mg once a a day in the morning  Please start metoprolol one pill with dinner  Take the diltiazem 30 mg or the propranolol 20 mg as needed for heart rate more than 100 beats per minute  Please call us if you have new issues that need to be addressed before your next appt.  Your physician wants you to follow-up in: 4 months.  You will receive a reminder letter in the mail two months in advance. If you don't receive a letter, please call our office to schedule the follow-up appointment.

## 2014-10-14 NOTE — Assessment & Plan Note (Signed)
Suggested she continue on her diltiazem 120 mg every morning. We will add metoprolol succinate 50 mg daily for tachycardia

## 2014-10-14 NOTE — Progress Notes (Signed)
Patient ID: Anita Benjamin, female    DOB: 1934/07/13, 81 y.o.   MRN: [id number]  HPI Comments: Anita Benjamin is a very pleasant 81 year old woman, patient of Dr. Nicki Reaper, who presents for evaluation of tachycardia, SVT episodes, possible atrial tachycardia, hypotension. She reports several year history, possibly even up to 10 years of tachyarrhythmia.  In follow-up today, she reports that she lost her husband in December 2015. Having sleepless nights, jittery inside She is uncertain if she is taking her medications correctly. She continues to take diltiazem 120 mg every morning. She's not been taking diltiazem 30 when necessary or propranolol when necessary She is been tracking her blood pressure and heart rates on a regular basis. There has been a significant improvement in her tachycardia on the diltiazem but she continues to have breakthrough tachycardia. Recent urinary tract infection has caused more palpitations. She has finished a course of Bactrim  EKG shows normal sinus rhythm with rate 80 bpm, no significant ST or T-wave changes  In terms of her family history, father had lung cancer, possible heart issues, died at age 89. Mother had a stroke, died at 19  In terms of her social history, she is not a smoker with no smoking history, lives at home with her husband. Is retired Total cholesterol 211  Allergies  Allergen Reactions  . Oruvail [Ketoprofen]   . Relafen [Nabumetone]   . Timentin [Ticarcillin-Pot Clavulanate]     Outpatient Encounter Prescriptions as of 10/14/2014  Medication Sig  . Ascorbic Acid (VITAMIN C) 500 MG CAPS Take 500 mg by mouth daily.  Marland Kitchen aspirin 81 MG tablet Take 81 mg by mouth daily.  . Calcium-Magnesium-Vitamin D (CALCIUM 500 PO) Take by mouth daily.  Marland Kitchen diltiazem (CARDIZEM CD) 120 MG 24 hr capsule Take 1 capsule (120 mg total) by mouth daily.  Marland Kitchen diltiazem (CARDIZEM) 30 MG tablet Take 1 tablet (30 mg total) by mouth 3 (three) times daily as needed.  . fish  oil-omega-3 fatty acids 1000 MG capsule Take 1 g by mouth daily.   . Flaxseed, Linseed, (FLAX SEEDS PO) Take by mouth daily.   . Multiple Vitamin (MULTIVITAMIN) tablet Take 1 tablet by mouth daily.  . propranolol (INDERAL) 20 MG tablet Take 1 tablet (20 mg total) by mouth 3 (three) times daily as needed.  . Selenium (SELENICAPS-200 PO) Take 200 mg by mouth daily.  Marland Kitchen sulfamethoxazole-trimethoprim (BACTRIM DS,SEPTRA DS) 800-160 MG per tablet Take 1 tablet by mouth 2 (two) times daily.  . metoprolol succinate (TOPROL-XL) 50 MG 24 hr tablet Take 1 tablet (50 mg total) by mouth daily. Take with or immediately following a meal.    Past Medical History  Diagnosis Date  . Hypercholesterolemia   . Hypertension   . Anemia   . Fibrocystic breast disease   . History of chicken pox     Past Surgical History  Procedure Laterality Date  . Laser repair of torn retina      Social History  reports that she has never smoked. She has never used smokeless tobacco. She reports that she does not drink alcohol or use illicit drugs.  Family History family history includes Breast cancer in her paternal aunt; Cancer (age of onset: 43) in her father; Heart disease (age of onset: 14) in her mother. There is no history of Colon cancer.  Review of Systems  Constitutional: Negative.   Eyes: Negative.   Respiratory: Negative.   Cardiovascular: Positive for palpitations.       Tachycardia  Gastrointestinal: Negative.   Musculoskeletal: Negative.   Skin: Negative.   Neurological: Negative.   Hematological: Negative.   Psychiatric/Behavioral: Negative.   All other systems reviewed and are negative.   BP 138/80 mmHg  Pulse 80  Ht 5\' 7"  (1.702 m)  Wt 124 lb 4 oz (56.359 kg)  BMI 19.46 kg/m2  LMP 10/27/1989  Physical Exam

## 2014-10-14 NOTE — Assessment & Plan Note (Signed)
She is not sleeping well after the loss of her husband in December 2015. Recommended she start a regular exercise program, talk with Dr. Nicki Reaper if symptoms persist as she might need medications for sleep.

## 2014-10-14 NOTE — Assessment & Plan Note (Signed)
Symptoms better after being treated with Bactrim. UTI likely the cause of her recent tachycardia breakthrough

## 2014-10-14 NOTE — Assessment & Plan Note (Signed)
Recommended  red yeast rice for her lipids

## 2014-11-05 ENCOUNTER — Ambulatory Visit (INDEPENDENT_AMBULATORY_CARE_PROVIDER_SITE_OTHER): Payer: Medicare Other | Admitting: Internal Medicine

## 2014-11-05 ENCOUNTER — Encounter: Payer: Self-pay | Admitting: Internal Medicine

## 2014-11-05 VITALS — BP 140/80 | HR 80 | Temp 98.3°F | Ht 67.0 in | Wt 125.2 lb

## 2014-11-05 DIAGNOSIS — Z658 Other specified problems related to psychosocial circumstances: Secondary | ICD-10-CM

## 2014-11-05 DIAGNOSIS — F439 Reaction to severe stress, unspecified: Secondary | ICD-10-CM

## 2014-11-05 DIAGNOSIS — R928 Other abnormal and inconclusive findings on diagnostic imaging of breast: Secondary | ICD-10-CM

## 2014-11-05 DIAGNOSIS — D72819 Decreased white blood cell count, unspecified: Secondary | ICD-10-CM

## 2014-11-05 DIAGNOSIS — G479 Sleep disorder, unspecified: Secondary | ICD-10-CM

## 2014-11-05 DIAGNOSIS — I10 Essential (primary) hypertension: Secondary | ICD-10-CM

## 2014-11-05 DIAGNOSIS — Z1239 Encounter for other screening for malignant neoplasm of breast: Secondary | ICD-10-CM

## 2014-11-05 DIAGNOSIS — E7800 Pure hypercholesterolemia, unspecified: Secondary | ICD-10-CM

## 2014-11-05 DIAGNOSIS — Z860100 Personal history of colon polyps, unspecified: Secondary | ICD-10-CM

## 2014-11-05 DIAGNOSIS — Z0000 Encounter for general adult medical examination without abnormal findings: Secondary | ICD-10-CM

## 2014-11-05 DIAGNOSIS — R000 Tachycardia, unspecified: Secondary | ICD-10-CM

## 2014-11-05 MED ORDER — TRAZODONE HCL 50 MG PO TABS: 25.0000 mg | ORAL_TABLET | Freq: Every evening | ORAL | Status: DC | PRN

## 2014-11-05 NOTE — Progress Notes (Signed)
Pre visit review using our clinic review tool, if applicable. No additional management support is needed unless otherwise documented below in the visit note. 

## 2014-11-05 NOTE — Progress Notes (Signed)
Patient ID: Kaylene Dawn, female   DOB: 09/21/1934, 81 y.o.   MRN: [id number]   Subjective:    Patient ID: [name], female    DOB: [birth year], 81 y.o.   MRN: [id number]  HPI  Patient here for her physical exam.  Has a history of hpertension and hypercholesterolemia.  Her husband passed away in Oct 10, 2023.  Increased stress related to this.  Not sleeping well.  Needs to relax.  Discussed her sleep and treatment recommendations.  Saw Dr Rockey Situ recently.  Taking metoprolol and cardizem.  No increased heart rate now.  Previously on bactrim.  Had increased heart rate related to this.  Dr Rockey Situ discussed with her regarding starting red yeast rice.  Has not started yet.  Breathing stable.     Past Medical History  Diagnosis Date  . Hypercholesterolemia   . Hypertension   . Anemia   . Fibrocystic breast disease   . History of chicken pox     Current Outpatient Prescriptions on File Prior to Visit  Medication Sig Dispense Refill  . Ascorbic Acid (VITAMIN C) 500 MG CAPS Take 500 mg by mouth daily.    Marland Kitchen aspirin 81 MG tablet Take 81 mg by mouth daily.    . Calcium-Magnesium-Vitamin D (CALCIUM 500 PO) Take by mouth daily.    Marland Kitchen diltiazem (CARDIZEM CD) 120 MG 24 hr capsule Take 1 capsule (120 mg total) by mouth daily. 30 capsule 6  . fish oil-omega-3 fatty acids 1000 MG capsule Take 1 g by mouth daily.     . Flaxseed, Linseed, (FLAX SEEDS PO) Take by mouth daily.     . metoprolol succinate (TOPROL-XL) 50 MG 24 hr tablet Take 1 tablet (50 mg total) by mouth daily. Take with or immediately following a meal. 30 tablet 11  . Multiple Vitamin (MULTIVITAMIN) tablet Take 1 tablet by mouth daily.    . Selenium (SELENICAPS-200 PO) Take 200 mg by mouth daily.    Marland Kitchen diltiazem (CARDIZEM) 30 MG tablet Take 1 tablet (30 mg total) by mouth 3 (three) times daily as needed. (Patient not taking: Reported on 11/05/2014) 90 tablet 3  . propranolol (INDERAL) 20 MG tablet Take 1 tablet (20 mg total) by mouth 3 (three) times  daily as needed. (Patient not taking: Reported on 11/05/2014) 90 tablet 1   No current facility-administered medications on file prior to visit.    Review of Systems  Constitutional: Positive for fatigue (increased fatigue.  not sleeping well.  ). Negative for unexpected weight change.  HENT: Negative for congestion and sinus pressure.   Respiratory: Negative for cough, chest tightness and shortness of breath.   Cardiovascular: Negative for chest pain, palpitations and leg swelling.  Gastrointestinal: Negative for nausea, vomiting, abdominal pain, diarrhea and constipation.  Genitourinary: Negative for dysuria and frequency.  Skin: Negative for color change and rash.  Neurological: Negative for dizziness and light-headedness.  Psychiatric/Behavioral: Positive for sleep disturbance. The patient is nervous/anxious (increased stress and anxiety related to her husband's death.  ).        Objective:    Physical Exam  Constitutional: She is oriented to person, place, and time. She appears well-developed and well-nourished.  HENT:  Nose: Nose normal.  Mouth/Throat: Oropharynx is clear and moist.  Eyes: Right eye exhibits no discharge. Left eye exhibits no discharge. No scleral icterus.  Neck: Neck supple. No thyromegaly present.  Cardiovascular: Normal rate and regular rhythm.   Pulmonary/Chest: Breath sounds normal. No accessory muscle usage. No tachypnea. No respiratory distress.  She has no decreased breath sounds. She has no wheezes. She has no rhonchi. Right breast exhibits no inverted nipple, no mass, no nipple discharge and no tenderness (no axillary adenopathy). Left breast exhibits no inverted nipple, no mass, no nipple discharge and no tenderness (no axilarry adenopathy).  Abdominal: Soft. Bowel sounds are normal. There is no tenderness.  Musculoskeletal: She exhibits no edema or tenderness.  Lymphadenopathy:    She has no cervical adenopathy.  Neurological: She is alert and oriented  to person, place, and time.  Skin: Skin is warm. No rash noted.  Psychiatric: She has a normal mood and affect. Her behavior is normal.    BP 140/80 mmHg  Pulse 80  Temp(Src) 98.3 F (36.8 C) (Oral)  Ht 5\' 7"  (1.702 m)  Wt 125 lb 4 oz (56.813 kg)  BMI 19.61 kg/m2  SpO2 97%  LMP 10/27/1989 Wt Readings from Last 3 Encounters:  11/05/14 125 lb 4 oz (56.813 kg)  10/14/14 124 lb 4 oz (56.359 kg)  10/09/14 124 lb 12.8 oz (56.609 kg)     Lab Results  Component Value Date   WBC 4.6 07/04/2014   HGB 14.2 07/04/2014   HCT 41.8 07/04/2014   PLT 258.0 07/04/2014   GLUCOSE 83 07/04/2014   CHOL 211* 11/06/2013   TRIG 92.0 11/06/2013   HDL 70.80 11/06/2013   LDLDIRECT 117.7 11/06/2013   LDLCALC 106* 11/08/2012   ALT 18 07/04/2014   AST 20 07/04/2014   NA 140 07/04/2014   K 5.3* 07/12/2014   CL 103 07/04/2014   CREATININE 1.0 07/04/2014   BUN 17 07/04/2014   CO2 31 07/04/2014   TSH 2.44 11/06/2013       Assessment & Plan:   Problem List Items Addressed This Visit    Abnormal mammogram    Mammogram 12/04/13 recommended f/u views in right breast.  F/u right mammogram - birads II.  Recommended f/u mammogram in one year.  Schedule.        Difficulty sleeping    Discussed at length with her today.  Discussed treatment options.  Start trazodone as directed.  Follow closely.        Health care maintenance    Physical today.       History of colonic polyps    Last colonoscopy 1/06 - hyperplastic polyps.  Recommended f/u in 10 years.        Hypercholesterolemia    Low cholesterol diet and exercise.  Follow lipid panel.  Dr Rockey Situ discussed with her regarding starting red yeast rice.  Follow.       Relevant Orders   Lipid panel   Hypertension    Blood pressure has been doing well.  Borderline today.  Increased stress.  Same medication regimen.  Follow pressures.       Relevant Orders   Comprehensive metabolic panel   Leukopenia    Last cbc - white count wnl.        Stress - Primary    Increased stress with her husband's recent passing.  Treat her sleep issues with trazodone.  Follow closely.        Relevant Orders   TSH   Tachycardia    Saw Dr Rockey Situ.  On metoprolol and diltiazem.  Stable.  Better off bactrim.        Other Visit Diagnoses    Breast cancer screening        Relevant Orders    MM DIGITAL SCREENING BILATERAL      I  spent 25 minutes with the patient and more than 50% of the time was spent in consultation regarding the above.     Einar Pheasant, MD

## 2014-11-08 ENCOUNTER — Encounter: Payer: Self-pay | Admitting: Internal Medicine

## 2014-11-08 DIAGNOSIS — G479 Sleep disorder, unspecified: Secondary | ICD-10-CM | POA: Insufficient documentation

## 2014-11-08 DIAGNOSIS — Z0000 Encounter for general adult medical examination without abnormal findings: Secondary | ICD-10-CM | POA: Insufficient documentation

## 2014-11-08 DIAGNOSIS — F439 Reaction to severe stress, unspecified: Secondary | ICD-10-CM | POA: Insufficient documentation

## 2014-11-08 NOTE — Assessment & Plan Note (Signed)
Saw Dr Rockey Situ.  On metoprolol and diltiazem.  Stable.  Better off bactrim.

## 2014-11-08 NOTE — Assessment & Plan Note (Signed)
Last colonoscopy 1/06 - hyperplastic polyps.  Recommended f/u in 10 years.

## 2014-11-08 NOTE — Assessment & Plan Note (Signed)
Blood pressure has been doing well.  Borderline today.  Increased stress.  Same medication regimen.  Follow pressures.

## 2014-11-08 NOTE — Assessment & Plan Note (Signed)
Physical today.   

## 2014-11-08 NOTE — Assessment & Plan Note (Signed)
Increased stress with her husband's recent passing.  Treat her sleep issues with trazodone.  Follow closely.

## 2014-11-08 NOTE — Assessment & Plan Note (Signed)
Low cholesterol diet and exercise.  Follow lipid panel.  Dr Rockey Situ discussed with her regarding starting red yeast rice.  Follow.

## 2014-11-08 NOTE — Assessment & Plan Note (Signed)
Last cbc - white count wnl.

## 2014-11-08 NOTE — Assessment & Plan Note (Signed)
Mammogram 12/04/13 recommended f/u views in right breast.  F/u right mammogram - birads II.  Recommended f/u mammogram in one year.  Schedule.

## 2014-11-08 NOTE — Assessment & Plan Note (Signed)
Discussed at length with her today.  Discussed treatment options.  Start trazodone as directed.  Follow closely.

## 2014-11-15 ENCOUNTER — Telehealth: Payer: Self-pay | Admitting: *Deleted

## 2014-11-15 ENCOUNTER — Ambulatory Visit (INDEPENDENT_AMBULATORY_CARE_PROVIDER_SITE_OTHER): Payer: Medicare Other | Admitting: Cardiovascular Disease

## 2014-11-15 ENCOUNTER — Encounter: Payer: Self-pay | Admitting: Cardiovascular Disease

## 2014-11-15 VITALS — BP 122/84 | HR 134 | Ht 67.0 in | Wt 124.5 lb

## 2014-11-15 DIAGNOSIS — I4892 Unspecified atrial flutter: Secondary | ICD-10-CM | POA: Insufficient documentation

## 2014-11-15 DIAGNOSIS — I483 Typical atrial flutter: Secondary | ICD-10-CM

## 2014-11-15 DIAGNOSIS — R000 Tachycardia, unspecified: Secondary | ICD-10-CM

## 2014-11-15 DIAGNOSIS — I10 Essential (primary) hypertension: Secondary | ICD-10-CM

## 2014-11-15 MED ORDER — AMIODARONE HCL 200 MG PO TABS: 200.0000 mg | ORAL_TABLET | Freq: Two times a day (BID) | ORAL | Status: DC

## 2014-11-15 NOTE — Patient Instructions (Addendum)
Tonight Take amiodarone 2 pills   Tomorrow/Sunday/Monday Check your heart rate Take amiodarone 2 pills in the Am and PM Diltiazem 1 pill three times a day Eliquis 5 mg twice a day Metoprolol one pill  in the evening  Hold the avapro  New scripts: Amiodarone  Please call us if you have new issues that need to be addressed before your next appt.  Your physician wants you to follow-up in: 1 week  Please call on Monday to let us know how you are feeling

## 2014-11-15 NOTE — Telephone Encounter (Signed)
Seen in clinic today, medications adjusted Appears to be in atrial flutter

## 2014-11-15 NOTE — Telephone Encounter (Signed)
Spoke w/ pt.  She reports that her HR is jumping while we are on the phone, from 120 to 162 bpm. Advised pt to come to clinic for EKG, as she is most likely in afib. Asked her to bring all of her meds w/ her.  She is appreciative and will be here shortly.

## 2014-11-15 NOTE — Assessment & Plan Note (Signed)
Medication changes as above 

## 2014-11-15 NOTE — Telephone Encounter (Signed)
Pt called stating that Diltiazem HCl Coated Beads is giving pt really bad reactions.  She stopped taking it, last time was wendsday Talked to dr.scotts office and they told her it was okay to go back on Irbesartan.  Been taking about 2 days, and is feeling a lot better.  Now heart rate went up, 159/ did not right it down.   He gave her two medications as back up, to take as emergency three times daily as needed, told patient if heart rate goes more than 100 to take it. Can she take it with the change she did.   Please advise.

## 2014-11-15 NOTE — Telephone Encounter (Signed)
[place] Patient Name: Anita Benjamin DOB: 1934-09-23 Initial Comment Caller states her heart dr put her on a BP medicine and is having side effects and stopped taking it. Caller states she decided on her on to restart an old prescription and her heart rate and BP elevated. Caller states her heart dr gave her a prescription for a medicine in case the medicine that he prescribed raised her heart rate. Caller wants to know if it is safe to take with her old BP medicine. Caller states she also has a call in to her heart dr for the same question Nurse Assessment Nurse: Markus Daft, RN, Sherre Poot Date/Time Eilene Ghazi Time): 11/15/2014 1:03:37 PM Confirm and document reason for call. If symptomatic, describe symptoms. ---Caller states her cardiologist put her on Diltiazem ER 120 mg QD and is having side effects - shakiness, insides trembling - and stopped taking it 11/13/14. She took another script written by PCP, Irbesartan 150 mg 1/2 QD, taken on Thursday and Friday, and again today around 8 am. HR was up to 170 bpm this AM. At 12:24 BP 117/92, HR 156. She was advised cardiologist to take Propranolol 20 mg take one tab by mouth TID PRN HR over 100. She is still really shaky. She has not taken it yet because she doesn't know if safe to take with Irbesartan. At 1:15 pm BP 140/83, HR 151. RN advised that there is a significant reaction of " irbesartan + propranolol --- irbesartan and propranolol both increase serum potassium. Potential for interaction, monitor." (per [employer] Drug Interaction checker). RN advised would need to speak with cardiologist. Triaged. Has the patient traveled out of the country within the last 30 days? ---Not Applicable Does the patient require triage? ---Yes Related visit to physician within the last 2 weeks? ---Yes Does the PT have any chronic conditions? (i.e. diabetes, asthma,  etc.) ---Yes List chronic conditions. ---Tachycardia Guidelines Guideline Title Affirmed Question Affirmed Notes Heart Rate and Heartbeat Questions [1] Heart beating very rapidly (e.g., > 140 / minute) AND [2] present now (Exception: during exercise) Final Disposition User Go to ED Now Markus Daft, RN, Sherre Poot Comments Caller refused to go to ED immediatly, stating that she should hear back from cardiologist at any time. RN advised that she call the doctor again d/t elevated HR. Caller verb. understanding.

## 2014-11-15 NOTE — Telephone Encounter (Signed)
Copy also forwarded to Dr. Rockey Situ & [facility]

## 2014-11-15 NOTE — Assessment & Plan Note (Signed)
Symptoms started this morning. Likely long history of flutter. We have recommended she start eliquis 5 mill grams twice a day for stroke prevention. Recommended that she start diltiazem 30 mg 3 times a day. Perhaps she will feel better on a non-extended release medication Suggested she stay on her metoprolol succinate 50 mg in the evenings We'll start amiodarone 400 mg twice a day for 5 days then down to 200 mg twice a day We'll hold the Avapro Stop the aspirin

## 2014-11-15 NOTE — Assessment & Plan Note (Signed)
She did not seem to remember having prior episodes of tachycardia on today's visit. Some confusion. Possible medication confusion. Possibly having paroxysmal atrial flutter

## 2014-11-15 NOTE — Progress Notes (Signed)
Patient ID: Anita Benjamin, female    DOB: December 26, 1933, 81 y.o.   MRN: [id number]  HPI Comments: Anita Benjamin is a very pleasant 81 year old woman, patient of Dr. Nicki Reaper, with a history of tachycardia, SVT episodes, possible atrial tachycardia, hypotension. She reports several 81 year history, possibly even up to 10 years of tachyarrhythmia. She presents today for follow-up of her tachycardia  We received a phone call from her stating that her heart rate this morning increased up to 150 bpm. Blood pressure was stable. we suggested that she come into the office. On follow-up today, EKG suggested atrial flutter with ventricular rate 130 bpm She reports that earlier in the week, she stopped diltiazem 120 mg daily as this had side effects, made her feel jittery, quivering inside Unclear she has been taking her metoprolol, it would seem that she has not been doing this either but it is unclear. She restarted Avapro as she was trying to make up for her diltiazem that she had stopped. Heart rate yesterday was in the 70s, heart rate today 130 up to 150s. Today she does not feel well, yesterday felt well She reports that when she was taking diltiazem CD she did not have any further episodes of tachycardia  EKG on today's visit shows atrial flutter with ventricular rate 133 bpm, nonspecific ST abnormality  Other past medical history  husband in December 2015. Having sleepless nights, jittery inside urinary tract infection  caused more palpitations. She has finished a course of Bactrim   In terms of her family history, father had lung cancer, possible heart issues, died at age 71. Mother had a stroke, died at 23  In terms of her social history, she is not a smoker with no smoking history, lives at home with her husband. Is retired Total cholesterol 211  Allergies  Allergen Reactions  . Oruvail [Ketoprofen]   . Relafen [Nabumetone]   . Timentin [Ticarcillin-Pot Clavulanate]     Outpatient Encounter  Prescriptions as of 11/15/2014  Medication Sig  . amiodarone (PACERONE) 200 MG tablet Take 1 tablet (200 mg total) by mouth 2 (two) times daily.  Marland Kitchen apixaban (ELIQUIS) 5 MG TABS tablet Take 1 tablet (5 mg total) by mouth 2 (two) times daily.  . Ascorbic Acid (VITAMIN C) 500 MG CAPS Take 500 mg by mouth daily.  . Calcium-Magnesium-Vitamin D (CALCIUM 500 PO) Take by mouth daily.  Marland Kitchen diltiazem (CARDIZEM) 30 MG tablet Take 1 tablet (30 mg total) by mouth 3 (three) times daily.  . fish oil-omega-3 fatty acids 1000 MG capsule Take 1 g by mouth daily.   . Flaxseed, Linseed, (FLAX SEEDS PO) Take by mouth daily.   . metoprolol succinate (TOPROL-XL) 50 MG 24 hr tablet Take 1 tablet (50 mg total) by mouth daily. Take with or immediately following a meal.  . Multiple Vitamin (MULTIVITAMIN) tablet Take 1 tablet by mouth daily.  . Selenium (SELENICAPS-200 PO) Take 200 mg by mouth daily.  . traZODone (DESYREL) 50 MG tablet Take 0.5-1 tablets (25-50 mg total) by mouth at bedtime as needed for sleep.  . [DISCONTINUED] aspirin 81 MG tablet Take 81 mg by mouth daily.  . [DISCONTINUED] diltiazem (CARDIZEM CD) 120 MG 24 hr capsule Take 1 capsule (120 mg total) by mouth daily.  . [DISCONTINUED] diltiazem (CARDIZEM) 30 MG tablet Take 1 tablet (30 mg total) by mouth 3 (three) times daily as needed. (Patient not taking: Reported on 11/05/2014)  . [DISCONTINUED] propranolol (INDERAL) 20 MG tablet Take 1 tablet (20 mg  total) by mouth 3 (three) times daily as needed. (Patient not taking: Reported on 11/05/2014)    Past Medical History  Diagnosis Date  . Hypercholesterolemia   . Hypertension   . Anemia   . Fibrocystic breast disease   . History of chicken pox     Past Surgical History  Procedure Laterality Date  . Laser repair of torn retina      Social History  reports that she has never smoked. She has never used smokeless tobacco. She reports that she does not drink alcohol or use illicit drugs.  Family  History family history includes Breast cancer in her paternal aunt; Cancer (age of onset: 32) in her father; Heart disease (age of onset: 39) in her mother. There is no history of Colon cancer.  Review of Systems  Constitutional: Negative.   Respiratory: Negative.   Cardiovascular: Positive for palpitations.       Tachycardia  Gastrointestinal: Negative.   Musculoskeletal: Negative.   Skin: Negative.   Neurological: Negative.   Hematological: Negative.   Psychiatric/Behavioral: Negative.   All other systems reviewed and are negative.   BP 122/84 mmHg  Pulse 134  Ht 5\' 7"  (1.702 m)  Wt 124 lb 8 oz (56.473 kg)  BMI 19.49 kg/m2  LMP 10/27/1989  Physical Exam  Constitutional: She is oriented to person, place, and time. She appears well-developed and well-nourished.  HENT:  Head: Normocephalic.  Nose: Nose normal.  Mouth/Throat: Oropharynx is clear and moist.  Eyes: Conjunctivae are normal. Pupils are equal, round, and reactive to light.  Neck: Normal range of motion. Neck supple. No JVD present.  Cardiovascular: Regular rhythm, S1 normal, S2 normal, normal heart sounds and intact distal pulses.  Tachycardia present.  Exam reveals no gallop and no friction rub.   No murmur heard. Pulmonary/Chest: Effort normal and breath sounds normal. No respiratory distress. She has no wheezes. She has no rales. She exhibits no tenderness.  Abdominal: Soft. Bowel sounds are normal. She exhibits no distension. There is no tenderness.  Musculoskeletal: Normal range of motion. She exhibits no edema or tenderness.  Lymphadenopathy:    She has no cervical adenopathy.  Neurological: She is alert and oriented to person, place, and time. Coordination normal.  Skin: Skin is warm and dry. No rash noted. No erythema.  Psychiatric: She has a normal mood and affect. Her behavior is normal. Judgment and thought content normal.    Assessment and Plan  Nursing note and vitals reviewed.

## 2014-11-16 NOTE — Telephone Encounter (Signed)
Noted  

## 2014-11-19 ENCOUNTER — Telehealth: Payer: Self-pay | Admitting: *Deleted

## 2014-11-19 NOTE — Telephone Encounter (Signed)
Spoke w/ Dr. Rockey Situ who recommends that pt stop diltiazem, but continue amiodarone 200 mg BID and her metoprolol.  Spoke w/ pt and advised her of this.  Pt verbalizes understanding and will call back if she is not feeling better by tomorrow.

## 2014-11-19 NOTE — Telephone Encounter (Signed)
Pt called stating that bp is going up and that the new medication that she was put in, has given her some really "odd" reactions.  BP was taken early this morning, 173/101 hr 74  She has not completely stopped taken the meds but today she's waiting on taking them all.  Diltiazem 30 mg is the only one she's taken today.   The reactions she's having is nausea, shivers, and diarrhea.  Please advise.

## 2014-11-19 NOTE — Telephone Encounter (Signed)
Spoke w/ pt.  She reports a possible reaction to amiodarone that was started on Fri 2/12: nausea, diarrhea, nervousness, SOB and insomnia. Reports BP 173/90 HR 73 today. Pt asks for something to be done today, requests not be sent to ED and repeats "Please tell him to help me".

## 2014-11-21 ENCOUNTER — Telehealth: Payer: Self-pay | Admitting: Internal Medicine

## 2014-11-21 NOTE — Telephone Encounter (Signed)
Pt is request to have an appt to discuss heart medication. Pt states that she is very nervous, decreased appitete, nausea, and dirrhea. Pt stated that she has spoken with cardiologist and he was taken off of one of her heart medications. Pt also complains of blood pressure issues.  Please advise/msn

## 2014-11-21 NOTE — Telephone Encounter (Signed)
Let her know that I am going to be out of the office and will return next Thursday.  (I can see her on Thursday 11/27/14 - work her in through lunch).  Just have her come in at 12:15.  If acute symptoms, will need to be seen earlier.  Thanks

## 2014-11-21 NOTE — Telephone Encounter (Signed)
Spoke with pt, advised of MDs message and scheduled appoint.

## 2014-11-28 ENCOUNTER — Ambulatory Visit (INDEPENDENT_AMBULATORY_CARE_PROVIDER_SITE_OTHER): Payer: Medicare Other | Admitting: Internal Medicine

## 2014-11-28 ENCOUNTER — Encounter: Payer: Self-pay | Admitting: Internal Medicine

## 2014-11-28 ENCOUNTER — Other Ambulatory Visit: Payer: Self-pay | Admitting: Cardiovascular Disease

## 2014-11-28 VITALS — BP 181/79 | HR 58 | Temp 97.9°F | Ht 67.0 in | Wt 124.2 lb

## 2014-11-28 DIAGNOSIS — E7800 Pure hypercholesterolemia, unspecified: Secondary | ICD-10-CM

## 2014-11-28 DIAGNOSIS — I483 Typical atrial flutter: Secondary | ICD-10-CM

## 2014-11-28 DIAGNOSIS — G479 Sleep disorder, unspecified: Secondary | ICD-10-CM

## 2014-11-28 DIAGNOSIS — R0602 Shortness of breath: Secondary | ICD-10-CM

## 2014-11-28 DIAGNOSIS — R0789 Other chest pain: Secondary | ICD-10-CM

## 2014-11-28 DIAGNOSIS — R000 Tachycardia, unspecified: Secondary | ICD-10-CM

## 2014-11-28 DIAGNOSIS — F439 Reaction to severe stress, unspecified: Secondary | ICD-10-CM

## 2014-11-28 DIAGNOSIS — E875 Hyperkalemia: Secondary | ICD-10-CM

## 2014-11-28 DIAGNOSIS — R109 Unspecified abdominal pain: Secondary | ICD-10-CM

## 2014-11-28 DIAGNOSIS — I10 Essential (primary) hypertension: Secondary | ICD-10-CM

## 2014-11-28 DIAGNOSIS — Z658 Other specified problems related to psychosocial circumstances: Secondary | ICD-10-CM | POA: Diagnosis not present

## 2014-11-28 LAB — COMPREHENSIVE METABOLIC PANEL
ALT: 81 U/L — ABNORMAL HIGH (ref 0–35)
AST: 34 U/L (ref 0–37)
Albumin: 4.4 g/dL (ref 3.5–5.2)
Alkaline Phosphatase: 69 U/L (ref 39–117)
BUN: 19 mg/dL (ref 6–23)
CO2: 31 mEq/L (ref 19–32)
Calcium: 10.1 mg/dL (ref 8.4–10.5)
Chloride: 103 mEq/L (ref 96–112)
Creatinine, Ser: 1.03 mg/dL (ref 0.40–1.20)
GFR: 54.98 mL/min — ABNORMAL LOW (ref >60.00)
Glucose, Bld: 116 mg/dL — ABNORMAL HIGH (ref 70–99)
Potassium: 5 mEq/L (ref 3.5–5.1)
Sodium: 139 mEq/L (ref 135–145)
Total Bilirubin: 0.5 mg/dL (ref 0.2–1.2)
Total Protein: 6.9 g/dL (ref 6.0–8.3)

## 2014-11-28 LAB — TSH: TSH: 2.9 u[IU]/mL (ref 0.35–4.50)

## 2014-11-28 NOTE — Patient Instructions (Signed)
Decrease the amiodarone to 200mg  once a day  Decrease metoprolol to 1/2 tablet per day.   Restart avapro as directed.    Remain off the diltiazem.

## 2014-11-28 NOTE — Progress Notes (Signed)
Patient ID: Anita Benjamin, female   DOB: Aug 08, 1934, 81 y.o.   MRN: [id number]   Subjective:    Patient ID: Anita Benjamin, female    DOB: Feb 28, 1934, 81 y.o.   MRN: [id number]  HPI  Patient here as a work in with concerns regarding nausea, nervousness, decreased appetite and generally not feeling well.  Saw Dr Rockey Situ on 11/15/14 as a work in for increased heart rate.  Was found to be in aflutter with heart rate of 130s.  She was started on eloquis, diltiazem changed to 30mg  tid and amiodarone started.  She is also on metoprolol 50mg  in the evening.  States that after she takes the medication, she feels sick on her stomach.  Relates the symptoms to the amiodarone.  States after she takes, her stomach rolls.  Forces herself to eat.  Reports her heart rate is averaging 51-55 in the am.  Feels like she is on too much medication.  Blood pressure has been elevated.  Blood pressure averaging 160-170s/90-100.  She reports increased indigestion.  Some sob with exertion.  Not taking eloquis secondary to side effects that she read about on the medication pamphlet.  Feels fatigued.  Increased stress.  Still trying to cope with her husband's death.  Agrees some of her symptoms may be related to the increased anxiety and stress.     Past Medical History  Diagnosis Date  . Hypercholesterolemia   . Hypertension   . Anemia   . Fibrocystic breast disease   . History of chicken pox     Current Outpatient Prescriptions on File Prior to Visit  Medication Sig Dispense Refill  . amiodarone (PACERONE) 200 MG tablet Take 1 tablet (200 mg total) by mouth 2 (two) times daily. 70 tablet 3  . apixaban (ELIQUIS) 5 MG TABS tablet Take 1 tablet (5 mg total) by mouth 2 (two) times daily. 60 tablet   . Ascorbic Acid (VITAMIN C) 500 MG CAPS Take 500 mg by mouth daily.    . Calcium-Magnesium-Vitamin D (CALCIUM 500 PO) Take by mouth daily.    Marland Kitchen diltiazem (CARDIZEM) 30 MG tablet Take 1 tablet (30 mg total) by mouth 3 (three) times daily.  90 tablet 3  . fish oil-omega-3 fatty acids 1000 MG capsule Take 1 g by mouth daily.     . Flaxseed, Linseed, (FLAX SEEDS PO) Take by mouth daily.     . metoprolol succinate (TOPROL-XL) 50 MG 24 hr tablet Take 1 tablet (50 mg total) by mouth daily. Take with or immediately following a meal. 30 tablet 11  . Multiple Vitamin (MULTIVITAMIN) tablet Take 1 tablet by mouth daily.    . Selenium (SELENICAPS-200 PO) Take 200 mg by mouth daily.    . traZODone (DESYREL) 50 MG tablet Take 0.5-1 tablets (25-50 mg total) by mouth at bedtime as needed for sleep. 30 tablet 1   No current facility-administered medications on file prior to visit.    Review of Systems  Constitutional: Positive for appetite change (decreased appetite) and fatigue. Negative for unexpected weight change.  HENT: Negative for congestion and sinus pressure.   Respiratory: Positive for shortness of breath (reports increased sob with exertion.  ). Negative for cough and chest tightness.   Cardiovascular: Negative for chest pain and leg swelling.       Previous increased heart rate.  This has resolved since starting the amiodarone.  Discussed at length with her today.    Gastrointestinal: Positive for nausea and abdominal pain (some abdominal discomfort.  ).  Negative for vomiting and diarrhea.  Genitourinary: Negative for dysuria and difficulty urinating.  Neurological: Negative for dizziness, light-headedness and headaches.  Psychiatric/Behavioral:       Increased stress and anxiety.  Trying to cope with her husband's death.         Objective:     Blood pressure recheck:  168/92  Physical Exam  Constitutional: She appears well-developed and well-nourished. No distress.  HENT:  Nose: Nose normal.  Mouth/Throat: Oropharynx is clear and moist.  Neck: Neck supple. No thyromegaly present.  Cardiovascular: Normal rate and regular rhythm.   Pulmonary/Chest: Breath sounds normal. No respiratory distress. She has no wheezes.    Abdominal: Soft. Bowel sounds are normal. There is no tenderness.  Musculoskeletal: She exhibits no edema or tenderness.  Lymphadenopathy:    She has no cervical adenopathy.  Skin: No rash noted. No erythema.    BP 181/79 mmHg  Pulse 58  Temp(Src) 97.9 F (36.6 C) (Oral)  Ht 5\' 7"  (1.702 m)  Wt 124 lb 4 oz (56.359 kg)  BMI 19.46 kg/m2  SpO2 96%  LMP 10/27/1989 Wt Readings from Last 3 Encounters:  11/28/14 124 lb 4 oz (56.359 kg)  11/15/14 124 lb 8 oz (56.473 kg)  11/05/14 125 lb 4 oz (56.813 kg)     Lab Results  Component Value Date   WBC 4.6 07/04/2014   HGB 14.2 07/04/2014   HCT 41.8 07/04/2014   PLT 258.0 07/04/2014   GLUCOSE 116* 11/28/2014   CHOL 211* 11/06/2013   TRIG 92.0 11/06/2013   HDL 70.80 11/06/2013   LDLDIRECT 117.7 11/06/2013   LDLCALC 106* 11/08/2012   ALT 81* 11/28/2014   AST 34 11/28/2014   NA 139 11/28/2014   K 5.0 11/28/2014   CL 103 11/28/2014   CREATININE 1.03 11/28/2014   BUN 19 11/28/2014   CO2 31 11/28/2014   TSH 2.90 11/28/2014       Assessment & Plan:   Problem List Items Addressed This Visit    Abdominal discomfort    Indigestion and abdominal discomfort as outlined.  Some decreased appetite.  Is eating.  No weight loss.  Start zantac.  Check liver panel, etc.  Adjust medications as necessary.  Follow.  Further w/up pending results and response to the adjustments.        Atrial flutter - Primary    In SR now.  On amiodarone.  Heart rate low.  Decrease amiodarone to 1 tablet per day.  Decrease metoprolol to 1/2 tablet q day.  Remain off diltiazem.  D/w Dr Rockey Situ.  Will schedule earlier appt.  Check ECHO.  EKG today revealed SR with no acute ischemic changes.        Difficulty sleeping    See above.  Stop trazodone.  Start zoloft as directed.  Follow.        Hypercholesterolemia    Low cholesterol diet and exercise.        Hyperkalemia    Recheck metabolic panel today.        Hypertension    Blood pressure elevated.   Heart rate low now.  Does not feel good.  Will change amiodarone to one q day.  Decrease metoprolol to 1/2 tablet q day.  Restart avapro as directed.  Follow pressures and heart rate closely.  Adjust medications if needed.        SOB (shortness of breath)    Reports sob with exertion.  EKG obtained and revealed SR (ventricular rate 50).  Discussed with  Dr Rockey Situ.  Will schedule an earlier appt.  Check ECHO.  Adjust the amiodarone and metoprolol as directed.  The bradycardia may be contributing to the sob, but need further cardiac evaluation as outlined.        Stress    Increased stress.  Not sleeping well.  Trazodone does not seem to be helping.  She has not been taking regularly.  Will stop the trazodone.  Start zoloft 50mg  1/2 tablet q day.  Follow closely.       Tachycardia    On amidarone now.  Off diltiazem.  On metoprolol.  Will decrease amiodarone to one tablet q day and metoprolol 1/2 tablet q day.  Follow pressures and heart rate.  Check echo.         Other Visit Diagnoses    Other chest pain        Relevant Orders    EKG 12-Lead (Completed)      I spent 25 minutes with the patient and more than 50% of the time was spent in consultation regarding the above.     Einar Pheasant, MD

## 2014-11-28 NOTE — Progress Notes (Signed)
Pre visit review using our clinic review tool, if applicable. No additional management support is needed unless otherwise documented below in the visit note. 

## 2014-11-29 ENCOUNTER — Other Ambulatory Visit: Payer: Self-pay | Admitting: Internal Medicine

## 2014-11-29 MED ORDER — SERTRALINE HCL 50 MG PO TABS: ORAL_TABLET | ORAL | Status: DC

## 2014-11-29 NOTE — Progress Notes (Signed)
Sent in rx for zoloft.

## 2014-12-01 ENCOUNTER — Encounter: Payer: Self-pay | Admitting: Internal Medicine

## 2014-12-01 ENCOUNTER — Other Ambulatory Visit: Payer: Self-pay | Admitting: Internal Medicine

## 2014-12-01 DIAGNOSIS — R110 Nausea: Secondary | ICD-10-CM

## 2014-12-01 DIAGNOSIS — R109 Unspecified abdominal pain: Secondary | ICD-10-CM | POA: Insufficient documentation

## 2014-12-01 DIAGNOSIS — R0602 Shortness of breath: Secondary | ICD-10-CM | POA: Insufficient documentation

## 2014-12-01 DIAGNOSIS — R7989 Other specified abnormal findings of blood chemistry: Secondary | ICD-10-CM

## 2014-12-01 DIAGNOSIS — R945 Abnormal results of liver function studies: Secondary | ICD-10-CM

## 2014-12-01 NOTE — Assessment & Plan Note (Signed)
Recheck metabolic panel today.   

## 2014-12-01 NOTE — Assessment & Plan Note (Signed)
Indigestion and abdominal discomfort as outlined.  Some decreased appetite.  Is eating.  No weight loss.  Start zantac.  Check liver panel, etc.  Adjust medications as necessary.  Follow.  Further w/up pending results and response to the adjustments.

## 2014-12-01 NOTE — Assessment & Plan Note (Signed)
Blood pressure elevated.  Heart rate low now.  Does not feel good.  Will change amiodarone to one q day.  Decrease metoprolol to 1/2 tablet q day.  Restart avapro as directed.  Follow pressures and heart rate closely.  Adjust medications if needed.

## 2014-12-01 NOTE — Assessment & Plan Note (Signed)
On amidarone now.  Off diltiazem.  On metoprolol.  Will decrease amiodarone to one tablet q day and metoprolol 1/2 tablet q day.  Follow pressures and heart rate.  Check echo.

## 2014-12-01 NOTE — Assessment & Plan Note (Signed)
Increased stress.  Not sleeping well.  Trazodone does not seem to be helping.  She has not been taking regularly.  Will stop the trazodone.  Start zoloft 50mg  1/2 tablet q day.  Follow closely.

## 2014-12-01 NOTE — Assessment & Plan Note (Signed)
In SR now.  On amiodarone.  Heart rate low.  Decrease amiodarone to 1 tablet per day.  Decrease metoprolol to 1/2 tablet q day.  Remain off diltiazem.  D/w Dr Rockey Situ.  Will schedule earlier appt.  Check ECHO.  EKG today revealed SR with no acute ischemic changes.

## 2014-12-01 NOTE — Assessment & Plan Note (Signed)
Reports sob with exertion.  EKG obtained and revealed SR (ventricular rate 50).  Discussed with Dr Rockey Situ.  Will schedule an earlier appt.  Check ECHO.  Adjust the amiodarone and metoprolol as directed.  The bradycardia may be contributing to the sob, but need further cardiac evaluation as outlined.

## 2014-12-01 NOTE — Assessment & Plan Note (Signed)
Low cholesterol diet and exercise.

## 2014-12-01 NOTE — Assessment & Plan Note (Signed)
See above.  Stop trazodone.  Start zoloft as directed.  Follow.

## 2014-12-01 NOTE — Progress Notes (Signed)
Order placed for abdominal ultrasound.   

## 2014-12-02 ENCOUNTER — Other Ambulatory Visit: Payer: Medicare Other

## 2014-12-04 ENCOUNTER — Encounter: Payer: Self-pay | Admitting: Internal Medicine

## 2014-12-04 ENCOUNTER — Ambulatory Visit: Payer: Self-pay | Admitting: Internal Medicine

## 2014-12-04 ENCOUNTER — Ambulatory Visit (INDEPENDENT_AMBULATORY_CARE_PROVIDER_SITE_OTHER): Payer: Medicare Other | Admitting: Internal Medicine

## 2014-12-04 VITALS — BP 179/77 | HR 60 | Temp 98.1°F | Ht 67.0 in | Wt 122.0 lb

## 2014-12-04 DIAGNOSIS — G479 Sleep disorder, unspecified: Secondary | ICD-10-CM

## 2014-12-04 DIAGNOSIS — R945 Abnormal results of liver function studies: Secondary | ICD-10-CM

## 2014-12-04 DIAGNOSIS — F439 Reaction to severe stress, unspecified: Secondary | ICD-10-CM

## 2014-12-04 DIAGNOSIS — Z658 Other specified problems related to psychosocial circumstances: Secondary | ICD-10-CM

## 2014-12-04 DIAGNOSIS — I483 Typical atrial flutter: Secondary | ICD-10-CM

## 2014-12-04 DIAGNOSIS — I10 Essential (primary) hypertension: Secondary | ICD-10-CM

## 2014-12-04 DIAGNOSIS — R0602 Shortness of breath: Secondary | ICD-10-CM

## 2014-12-04 DIAGNOSIS — R7989 Other specified abnormal findings of blood chemistry: Secondary | ICD-10-CM

## 2014-12-04 LAB — HEPATIC FUNCTION PANEL
ALT: 30 U/L (ref 0–35)
AST: 20 U/L (ref 0–37)
Albumin: 4.5 g/dL (ref 3.5–5.2)
Alkaline Phosphatase: 58 U/L (ref 39–117)
Bilirubin, Direct: 0.1 mg/dL (ref 0.0–0.3)
Total Bilirubin: 0.8 mg/dL (ref 0.2–1.2)
Total Protein: 7.3 g/dL (ref 6.0–8.3)

## 2014-12-04 NOTE — Progress Notes (Signed)
Pre visit review using our clinic review tool, if applicable. No additional management support is needed unless otherwise documented below in the visit note. 

## 2014-12-04 NOTE — Progress Notes (Signed)
Patient ID: Anita Benjamin, female   DOB: [birth year], 81 y.o.   MRN: [id number]   Subjective:    Patient ID: Anita Benjamin, female    DOB: [birth year], 81 y.o.   MRN: [id number]  HPI  Patient here for a scheduled follow up.  I saw her last week.  She just did not feel well.  Was having sob and some chest discomfort.  See last note for details.  Was noted to be bradycardic.  Adjusted medications.  She does report her sob is better.  No chest tightness.  States she is not getting winded walking to the mailbox.  Still feels anxious.  Jittery feeling.  Still coping with the recent death of her husband.  Started on zoloft last week.  Stopped trazodone.  Sleeping has been better the last few nights.  Increased nausea.  Decreased appetite.  She is eating.  No vomiting.  Hard stool today.  No bowel movement yesterday.     Past Medical History  Diagnosis Date  . Hypercholesterolemia   . Hypertension   . Anemia   . Fibrocystic breast disease   . History of chicken pox     Current Outpatient Prescriptions on File Prior to Visit  Medication Sig Dispense Refill  . amiodarone (PACERONE) 200 MG tablet Take 1 tablet (200 mg total) by mouth 2 (two) times daily. 70 tablet 3  . Ascorbic Acid (VITAMIN C) 500 MG CAPS Take 500 mg by mouth daily.    . Calcium-Magnesium-Vitamin D (CALCIUM 500 PO) Take by mouth daily.    . fish oil-omega-3 fatty acids 1000 MG capsule Take 1 g by mouth daily.     . Flaxseed, Linseed, (FLAX SEEDS PO) Take by mouth daily.     . metoprolol succinate (TOPROL-XL) 50 MG 24 hr tablet Take 1 tablet (50 mg total) by mouth daily. Take with or immediately following a meal. 30 tablet 11  . Multiple Vitamin (MULTIVITAMIN) tablet Take 1 tablet by mouth daily.    . Selenium (SELENICAPS-200 PO) Take 200 mg by mouth daily.    . sertraline (ZOLOFT) 50 MG tablet 1/2 tablet q day x 1 week and then 1 tablet q day 30 tablet 2   No current facility-administered medications on file prior to visit.    Review  of Systems  Constitutional: Positive for appetite change (decreased appetite.  she is eating.  ). Negative for fever.  HENT: Negative for congestion and sinus pressure.   Respiratory: Negative for cough, chest tightness and shortness of breath (breathing better.  ).   Cardiovascular: Negative for chest pain, palpitations and leg swelling.  Gastrointestinal: Positive for nausea and constipation. Negative for vomiting and diarrhea.       Decreased appetite.  Recent abnormal liver function test.  Genitourinary: Negative for dysuria and difficulty urinating.  Musculoskeletal: Negative for back pain and joint swelling.  Skin: Negative for color change and rash.  Neurological: Negative for dizziness, light-headedness and headaches.  Psychiatric/Behavioral:       Increased anxiety.  Trying to cope with the recent death of her husband.         Objective:     Blood pressure recheck prior to leaving 158-160/78-82  Physical Exam  HENT:  Nose: Nose normal.  Mouth/Throat: Oropharynx is clear and moist.  Neck: Neck supple. No thyromegaly present.  Cardiovascular: Normal rate and regular rhythm.   Pulmonary/Chest: Breath sounds normal. No respiratory distress. She has no wheezes.  Abdominal: Soft. Bowel sounds are normal. There is no  tenderness.  Musculoskeletal: She exhibits no edema or tenderness.  Lymphadenopathy:    She has no cervical adenopathy.  Skin: No rash noted. No erythema.    BP 179/77 mmHg  Pulse 60  Temp(Src) 98.1 F (36.7 C) (Oral)  Ht 5\' 7"  (1.702 m)  Wt 122 lb (55.339 kg)  BMI 19.10 kg/m2  SpO2 98%  LMP 10/27/1989 Wt Readings from Last 3 Encounters:  12/04/14 122 lb (55.339 kg)  11/28/14 124 lb 4 oz (56.359 kg)  11/15/14 124 lb 8 oz (56.473 kg)     Lab Results  Component Value Date   WBC 4.6 07/04/2014   HGB 14.2 07/04/2014   HCT 41.8 07/04/2014   PLT 258.0 07/04/2014   GLUCOSE 116* 11/28/2014   CHOL 211* 11/06/2013   TRIG 92.0 11/06/2013   HDL 70.80  11/06/2013   LDLDIRECT 117.7 11/06/2013   LDLCALC 106* 11/08/2012   ALT 30 12/04/2014   AST 20 12/04/2014   NA 139 11/28/2014   K 5.0 11/28/2014   CL 103 11/28/2014   CREATININE 1.03 11/28/2014   BUN 19 11/28/2014   CO2 31 11/28/2014   TSH 2.90 11/28/2014       Assessment & Plan:   Problem List Items Addressed This Visit    Abnormal liver function test    Recheck liver panel today.  Had her abdominal ultrasound today.  Obtain results.  Continue eating and staying hydrated.  Follow.        Atrial flutter    Appears to be in SR.  No increased heart rate or palpitations now.  On amiodarone.  Taking one q day.  Heart rate better (68 today on my check).  Keep f/u appt with Dr Rockey Situ.  Keep appt for ECHO.      Relevant Medications   aspirin 81 MG tablet   Difficulty sleeping    Off trazodone.  Sleeping better.  Follow.        Hypertension    Blood pressure still elevated.  Increase avapro to one whole tablet q day.  Continue 1/2 of 50mg  metoprolol.  Follow pressures.  Has f/u planned with Dr Rockey Situ.       Relevant Medications   aspirin 81 MG tablet   SOB (shortness of breath)    Better since adjusting her medications.  Heart rate better today.  Will continue amiodarone daily and metoprolol 1/2 tablet q day.  Keep f/u with Dr Rockey Situ.        Stress    Increased stress as outlined.  I feel this is aggravating her ongoing symptoms.  Just started zololft.  Continue as planned.  Remain off trazodone.  Sleeping better.         Other Visit Diagnoses    Abnormal liver function tests    -  Primary    Relevant Orders    Hepatic function panel (Completed)      I spent 25 minutes with the patient and more than 50% of the time was spent in consultation regarding the above.     Einar Pheasant, MD

## 2014-12-04 NOTE — Patient Instructions (Signed)
Increase avapro to one whole tablet per day.    Continue the other medications as you are doing.

## 2014-12-05 ENCOUNTER — Encounter: Payer: Self-pay | Admitting: Internal Medicine

## 2014-12-05 DIAGNOSIS — R7989 Other specified abnormal findings of blood chemistry: Secondary | ICD-10-CM | POA: Insufficient documentation

## 2014-12-05 DIAGNOSIS — R945 Abnormal results of liver function studies: Secondary | ICD-10-CM | POA: Insufficient documentation

## 2014-12-05 NOTE — Assessment & Plan Note (Signed)
Off trazodone.  Sleeping better.  Follow.

## 2014-12-05 NOTE — Assessment & Plan Note (Signed)
Recheck liver panel today.  Had her abdominal ultrasound today.  Obtain results.  Continue eating and staying hydrated.  Follow.

## 2014-12-05 NOTE — Assessment & Plan Note (Signed)
Increased stress as outlined.  I feel this is aggravating her ongoing symptoms.  Just started zololft.  Continue as planned.  Remain off trazodone.  Sleeping better.

## 2014-12-05 NOTE — Assessment & Plan Note (Signed)
Better since adjusting her medications.  Heart rate better today.  Will continue amiodarone daily and metoprolol 1/2 tablet q day.  Keep f/u with Dr Rockey Situ.

## 2014-12-05 NOTE — Assessment & Plan Note (Signed)
Appears to be in SR.  No increased heart rate or palpitations now.  On amiodarone.  Taking one q day.  Heart rate better (68 today on my check).  Keep f/u appt with Dr Rockey Situ.  Keep appt for ECHO.

## 2014-12-05 NOTE — Assessment & Plan Note (Signed)
Blood pressure still elevated.  Increase avapro to one whole tablet q day.  Continue 1/2 of 50mg  metoprolol.  Follow pressures.  Has f/u planned with Dr Rockey Situ.

## 2014-12-09 ENCOUNTER — Ambulatory Visit (INDEPENDENT_AMBULATORY_CARE_PROVIDER_SITE_OTHER): Payer: Medicare Other | Admitting: Cardiovascular Disease

## 2014-12-09 ENCOUNTER — Encounter: Payer: Self-pay | Admitting: Cardiovascular Disease

## 2014-12-09 VITALS — BP 160/72 | HR 58 | Ht 67.0 in | Wt 122.5 lb

## 2014-12-09 DIAGNOSIS — Z658 Other specified problems related to psychosocial circumstances: Secondary | ICD-10-CM

## 2014-12-09 DIAGNOSIS — I4891 Unspecified atrial fibrillation: Secondary | ICD-10-CM

## 2014-12-09 DIAGNOSIS — F439 Reaction to severe stress, unspecified: Secondary | ICD-10-CM

## 2014-12-09 DIAGNOSIS — R110 Nausea: Secondary | ICD-10-CM

## 2014-12-09 DIAGNOSIS — I483 Typical atrial flutter: Secondary | ICD-10-CM

## 2014-12-09 DIAGNOSIS — I10 Essential (primary) hypertension: Secondary | ICD-10-CM

## 2014-12-09 NOTE — Assessment & Plan Note (Signed)
Blood pressure is elevated today. We did offer to increase her Avapro up to 300 mg daily. Suggested she monitor her blood pressure at home. We'll hold off on additional medications given her relative intolerance to many pills secondary to vague symptoms such as feeling jittery, nausea, general malaise.

## 2014-12-09 NOTE — Assessment & Plan Note (Signed)
Recently started on Zantac for symptoms over the past week. She is uncertain if this is helping. We have given her a prescription for Zofran given her chronic nausea.  we will decrease the amiodarone down to 100 mg daily though uncertain if this is contributing

## 2014-12-09 NOTE — Assessment & Plan Note (Signed)
No further atrial flutter but she does report some nausea which has been persistent. Etiology is unclear. Unable to exclude amiodarone as a cause of her symptoms though less likely. She could try amiodarone 100 mg daily. If she has recurrent atrial flutter, could go back to 200 mg daily

## 2014-12-09 NOTE — Progress Notes (Signed)
Patient ID: Anita Benjamin, female    DOB: 27-Jul-1934, 81 y.o.   MRN: [id number]  HPI Comments: Anita Benjamin is a very pleasant 81 year old woman, patient of Dr. Nicki Reaper, with a history of tachycardia, SVT episodes, atrial flutter, hypotension. She reports several year history, possibly even up to 10 years of tachyarrhythmia. She presents today for follow-up of her tachycardia  In follow-up, she has been taking metoprolol succinate 25 mg in the evening, amiodarone 200 mg daily with Avapro 150 mg daily. Blood pressure continues to run mildly high She is troubled by chronic nausea. Recently started on Zantac for the past week 150 mg daily. She is uncertain if this is helping her nausea. She wonders if going up on her sertraline from half pill to whole pill made her nausea worse. Symptoms of nausea are better with food but returned later  On prior office visit she had atrial flutter with ventricular rate 130 bpm. She is not had any recurrent symptoms as she feels very jittery when she is in atrial flutter  EKG on today's visit shows normal sinus rhythm with rate 58 bpm, no significant ST or T-wave changes  Other past medical history  husband in December 2015. Having sleepless nights, jittery inside urinary tract infection  caused more palpitations. She has finished a course of Bactrim  In terms of her family history, father had lung cancer, possible heart issues, died at age 68. Mother had a stroke, died at 35  In terms of her social history, she is not a smoker with no smoking history, lives at home with her husband. Is retired Total cholesterol 211  Allergies  Allergen Reactions  . Oruvail [Ketoprofen]   . Relafen [Nabumetone]   . Timentin [Ticarcillin-Pot Clavulanate]     Outpatient Encounter Prescriptions as of 12/09/2014  Medication Sig  . amiodarone (PACERONE) 200 MG tablet Take 1 tablet (200 mg total) by mouth 2 (two) times daily.  . Ascorbic Acid (VITAMIN C) 500 MG CAPS Take 500 mg by  mouth daily.  Marland Kitchen aspirin 81 MG tablet Take 81 mg by mouth daily.  . Calcium-Magnesium-Vitamin D (CALCIUM 500 PO) Take by mouth daily.  . fish oil-omega-3 fatty acids 1000 MG capsule Take 1 g by mouth daily.   . Flaxseed, Linseed, (FLAX SEEDS PO) Take by mouth daily.   . irbesartan (AVAPRO) 150 MG tablet Take 150 mg by mouth daily.  . metoprolol succinate (TOPROL-XL) 50 MG 24 hr tablet Take 1 tablet (50 mg total) by mouth daily. Take with or immediately following a meal. (Patient taking differently: Take 25 mg by mouth daily. Take with or immediately following a meal.)  . Multiple Vitamin (MULTIVITAMIN) tablet Take 1 tablet by mouth daily.  . ranitidine (ZANTAC) 150 MG capsule Take 150 mg by mouth 2 (two) times daily.  . Selenium (SELENICAPS-200 PO) Take 200 mg by mouth daily.  . sertraline (ZOLOFT) 50 MG tablet 1/2 tablet q day x 1 week and then 1 tablet q day    Past Medical History  Diagnosis Date  . Hypercholesterolemia   . Hypertension   . Anemia   . Fibrocystic breast disease   . History of chicken pox     Past Surgical History  Procedure Laterality Date  . Laser repair of torn retina      Social History  reports that she has never smoked. She has never used smokeless tobacco. She reports that she does not drink alcohol or use illicit drugs.  Family History family history  includes Breast cancer in her paternal aunt; Cancer (age of onset: 40) in her father; Heart disease (age of onset: 60) in her mother. There is no history of Colon cancer.  Review of Systems  Constitutional: Negative.   Respiratory: Negative.   Cardiovascular: Negative.   Gastrointestinal: Positive for nausea.  Musculoskeletal: Negative.   Skin: Negative.   Neurological: Negative.   Hematological: Negative.   Psychiatric/Behavioral: Negative.   All other systems reviewed and are negative.   BP 160/72 mmHg  Pulse 58  Ht 5\' 7"  (1.702 m)  Wt 122 lb 8 oz (55.566 kg)  BMI 19.18 kg/m2  LMP  10/27/1989  Physical Exam  Constitutional: She is oriented to person, place, and time. She appears well-developed and well-nourished.  HENT:  Head: Normocephalic.  Nose: Nose normal.  Mouth/Throat: Oropharynx is clear and moist.  Eyes: Conjunctivae are normal. Pupils are equal, round, and reactive to light.  Neck: Normal range of motion. Neck supple. No JVD present.  Cardiovascular: Regular rhythm, S1 normal, S2 normal, normal heart sounds and intact distal pulses.  Tachycardia present.  Exam reveals no gallop and no friction rub.   No murmur heard. Pulmonary/Chest: Effort normal and breath sounds normal. No respiratory distress. She has no wheezes. She has no rales. She exhibits no tenderness.  Abdominal: Soft. Bowel sounds are normal. She exhibits no distension. There is no tenderness.  Musculoskeletal: Normal range of motion. She exhibits no edema or tenderness.  Lymphadenopathy:    She has no cervical adenopathy.  Neurological: She is alert and oriented to person, place, and time. Coordination normal.  Skin: Skin is warm and dry. No rash noted. No erythema.  Psychiatric: She has a normal mood and affect. Her behavior is normal. Judgment and thought content normal.    Assessment and Plan  Nursing note and vitals reviewed.

## 2014-12-09 NOTE — Assessment & Plan Note (Signed)
She is taking sertraline. She wonders if nausea is now worse on the full pill compared to the half pill.

## 2014-12-09 NOTE — Patient Instructions (Signed)
You are doing well.  If nausea persists,  Consider cutting the amiodarone in 1/2 daily  For tachycardia, take an extra amiodarone  Please call us if you have new issues that need to be addressed before your next appt.  Your physician wants you to follow-up in: 1 month.

## 2014-12-16 ENCOUNTER — Other Ambulatory Visit (INDEPENDENT_AMBULATORY_CARE_PROVIDER_SITE_OTHER): Payer: Medicare Other

## 2014-12-16 ENCOUNTER — Other Ambulatory Visit: Payer: Self-pay

## 2014-12-16 DIAGNOSIS — R0602 Shortness of breath: Secondary | ICD-10-CM

## 2014-12-16 DIAGNOSIS — I483 Typical atrial flutter: Secondary | ICD-10-CM

## 2014-12-16 DIAGNOSIS — R000 Tachycardia, unspecified: Secondary | ICD-10-CM | POA: Diagnosis not present

## 2014-12-17 ENCOUNTER — Encounter: Payer: Self-pay | Admitting: *Deleted

## 2014-12-17 ENCOUNTER — Ambulatory Visit: Payer: Medicare Other | Admitting: Cardiovascular Disease

## 2014-12-24 ENCOUNTER — Other Ambulatory Visit: Payer: Self-pay

## 2014-12-24 MED ORDER — IRBESARTAN 150 MG PO TABS: 150.0000 mg | ORAL_TABLET | Freq: Every day | ORAL | Status: DC

## 2014-12-24 NOTE — Telephone Encounter (Signed)
Rx sent to pharmacy by escript  

## 2014-12-24 NOTE — Telephone Encounter (Signed)
The patient came in and stated she needs a refill of her generic Avapro 150mg   [facility]  Pt callback - [phone]

## 2014-12-27 ENCOUNTER — Ambulatory Visit: Payer: Self-pay | Admitting: Internal Medicine

## 2014-12-31 ENCOUNTER — Encounter: Payer: Self-pay | Admitting: Internal Medicine

## 2015-01-02 ENCOUNTER — Encounter: Payer: Self-pay | Admitting: Internal Medicine

## 2015-01-02 ENCOUNTER — Ambulatory Visit: Admit: 2015-01-02 | Disposition: A | Discharge status: Home / Self Care | Payer: Self-pay | Admitting: Internal Medicine

## 2015-01-02 LAB — HM MAMMOGRAPHY

## 2015-01-07 ENCOUNTER — Encounter: Payer: Self-pay | Admitting: Internal Medicine

## 2015-01-08 ENCOUNTER — Encounter: Payer: Self-pay | Admitting: Internal Medicine

## 2015-01-08 ENCOUNTER — Ambulatory Visit (INDEPENDENT_AMBULATORY_CARE_PROVIDER_SITE_OTHER): Payer: Medicare Other | Admitting: Internal Medicine

## 2015-01-08 VITALS — BP 137/71 | HR 71 | Temp 98.2°F | Ht 67.0 in | Wt 123.5 lb

## 2015-01-08 DIAGNOSIS — R928 Other abnormal and inconclusive findings on diagnostic imaging of breast: Secondary | ICD-10-CM

## 2015-01-08 DIAGNOSIS — R7989 Other specified abnormal findings of blood chemistry: Secondary | ICD-10-CM

## 2015-01-08 DIAGNOSIS — Z658 Other specified problems related to psychosocial circumstances: Secondary | ICD-10-CM

## 2015-01-08 DIAGNOSIS — I483 Typical atrial flutter: Secondary | ICD-10-CM

## 2015-01-08 DIAGNOSIS — G479 Sleep disorder, unspecified: Secondary | ICD-10-CM

## 2015-01-08 DIAGNOSIS — F439 Reaction to severe stress, unspecified: Secondary | ICD-10-CM

## 2015-01-08 DIAGNOSIS — R945 Abnormal results of liver function studies: Secondary | ICD-10-CM

## 2015-01-08 DIAGNOSIS — I10 Essential (primary) hypertension: Secondary | ICD-10-CM

## 2015-01-08 NOTE — Progress Notes (Signed)
Pre visit review using our clinic review tool, if applicable. No additional management support is needed unless otherwise documented below in the visit note. 

## 2015-01-08 NOTE — Progress Notes (Signed)
Patient ID: Anita Benjamin, female   DOB: Nov 25, 1933, 81 y.o.   MRN: [id number]   Subjective:    Patient ID: Anita Benjamin, female    DOB: Jun 03, 1934, 81 y.o.   MRN: [id number]  HPI  Patient here for a scheduled follow up.  States she is feeling better.  Not as anxious.  Tolerating the zoloft.  Blood pressure is better.  Taking avapro 150mg  q day.  Heart rate is better.  Breathing stable.  Eating better.  Appetite better.  No nausea or vomiting.  No bowel change.     Past Medical History  Diagnosis Date  . Hypercholesterolemia   . Hypertension   . Anemia   . Fibrocystic breast disease   . History of chicken pox     Current Outpatient Prescriptions on File Prior to Visit  Medication Sig Dispense Refill  . amiodarone (PACERONE) 200 MG tablet Take 1 tablet (200 mg total) by mouth 2 (two) times daily. 70 tablet 3  . Ascorbic Acid (VITAMIN C) 500 MG CAPS Take 500 mg by mouth daily.    Marland Kitchen aspirin 81 MG tablet Take 81 mg by mouth daily.    . Calcium-Magnesium-Vitamin D (CALCIUM 500 PO) Take by mouth daily.    . fish oil-omega-3 fatty acids 1000 MG capsule Take 1 g by mouth daily.     . Flaxseed, Linseed, (FLAX SEEDS PO) Take by mouth daily.     . irbesartan (AVAPRO) 150 MG tablet Take 1 tablet (150 mg total) by mouth daily. 30 tablet 5  . Multiple Vitamin (MULTIVITAMIN) tablet Take 1 tablet by mouth daily.    . ranitidine (ZANTAC) 150 MG capsule Take 150 mg by mouth 2 (two) times daily.    . Selenium (SELENICAPS-200 PO) Take 200 mg by mouth daily.    . sertraline (ZOLOFT) 50 MG tablet 1/2 tablet q day x 1 week and then 1 tablet q day 30 tablet 2  . metoprolol succinate (TOPROL-XL) 50 MG 24 hr tablet Take 1 tablet (50 mg total) by mouth daily. Take with or immediately following a meal. (Patient not taking: Reported on 01/08/2015) 30 tablet 11   No current facility-administered medications on file prior to visit.    Review of Systems  Constitutional: Positive for appetite change (increased appetite.   ). Negative for unexpected weight change.  HENT: Negative for congestion and sinus pressure.   Respiratory: Negative for cough, chest tightness and shortness of breath.   Cardiovascular: Negative for chest pain, palpitations and leg swelling.  Gastrointestinal: Negative for nausea, vomiting, abdominal pain and diarrhea.  Neurological: Negative for dizziness, light-headedness and headaches.  Psychiatric/Behavioral:       Doing better with her anxiety.  Sleeping better.  Taking zoloft.         Objective:    Physical Exam  Constitutional: No distress.  HENT:  Nose: Nose normal.  Mouth/Throat: Oropharynx is clear and moist.  Neck: Neck supple. No thyromegaly present.  Cardiovascular: Normal rate and regular rhythm.   Pulmonary/Chest: Breath sounds normal. No respiratory distress. She has no wheezes.  Abdominal: Soft. Bowel sounds are normal. There is no tenderness.  Musculoskeletal: She exhibits no edema or tenderness.  Lymphadenopathy:    She has no cervical adenopathy.  Skin: No rash noted. No erythema.  Psychiatric: She has a normal mood and affect. Her behavior is normal.    BP 137/71 mmHg  Pulse 71  Temp(Src) 98.2 F (36.8 C) (Oral)  Ht 5\' 7"  (1.702 m)  Wt 123 lb  8 oz (56.019 kg)  BMI 19.34 kg/m2  SpO2 96%  LMP 10/27/1989 Wt Readings from Last 3 Encounters:  01/08/15 123 lb 8 oz (56.019 kg)  12/09/14 122 lb 8 oz (55.566 kg)  12/04/14 122 lb (55.339 kg)     Lab Results  Component Value Date   WBC 4.6 07/04/2014   HGB 14.2 07/04/2014   HCT 41.8 07/04/2014   PLT 258.0 07/04/2014   GLUCOSE 116* 11/28/2014   CHOL 211* 11/06/2013   TRIG 92.0 11/06/2013   HDL 70.80 11/06/2013   LDLDIRECT 117.7 11/06/2013   LDLCALC 106* 11/08/2012   ALT 30 12/04/2014   AST 20 12/04/2014   NA 139 11/28/2014   K 5.0 11/28/2014   CL 103 11/28/2014   CREATININE 1.03 11/28/2014   BUN 19 11/28/2014   CO2 31 11/28/2014   TSH 2.90 11/28/2014       Assessment & Plan:   Problem  List Items Addressed This Visit    Abnormal liver function test    Recheck liver panel - wnl.        Relevant Orders   Hepatic function panel   Abnormal mammogram    Recent screening mammogram recommended f/u views.  F/u left breast mammogram 01/02/15 - [facility] I.  Recommended yearly mammogram.       Atrial flutter    Doing better.  Continue to f/u with cardiology.       Difficulty sleeping    Sleeping better.  Continue zoloft.  Follow.       Hypertension    Blood pressure doing better.  Continue current medication regimen.        Relevant Orders   Lipid panel   Basic metabolic panel   Stress - Primary    Doing better.  On zoloft.  Follow closely.            Anita Pheasant, MD

## 2015-01-12 ENCOUNTER — Encounter: Payer: Self-pay | Admitting: Internal Medicine

## 2015-01-12 NOTE — Assessment & Plan Note (Signed)
Blood pressure doing better.  Continue current medication regimen.

## 2015-01-12 NOTE — Assessment & Plan Note (Signed)
Recent screening mammogram recommended f/u views.  F/u left breast mammogram 01/02/15 - Birads I.  Recommended yearly mammogram.

## 2015-01-12 NOTE — Assessment & Plan Note (Signed)
Sleeping better.  Continue zoloft.  Follow.

## 2015-01-12 NOTE — Assessment & Plan Note (Signed)
Doing better.  On zoloft.  Follow closely.

## 2015-01-12 NOTE — Assessment & Plan Note (Signed)
Recheck liver panel - wnl.

## 2015-01-12 NOTE — Assessment & Plan Note (Signed)
Doing better.  Continue to f/u with cardiology.

## 2015-01-24 NOTE — Op Note (Signed)
PATIENT NAME:  Anita Benjamin, Anita Benjamin MR#:  [id number] DATE OF BIRTH:  Nov 09, 1933  DATE OF PROCEDURE:  05/14/2013  PREOPERATIVE DIAGNOSIS: Cataract, right eye.   POSTOPERATIVE DIAGNOSIS: Cataract, right eye.  PROCEDURE PERFORMED: Extracapsular cataract extraction using phacoemulsification with placement of Alcon [id number], 22.0-diopter posterior chamber lens, serial number [id number].   SURGEON: Loura Back. Yarnell Arvidson, M.D.   ANESTHESIA: 4% lidocaine and 0.75% Marcaine, a 50-50 mixture with 10 units/mL of HyoMax added, given as a peribulbar.   ANESTHESIOLOGIST: Dr. Boston Service.   COMPLICATIONS: None.   ESTIMATED BLOOD LOSS: Less than 1 mL.   DESCRIPTION OF PROCEDURE: The patient was brought to the operating room and given a peribulbar block.  The patient was then prepped and draped in the usual fashion.  The vertical rectus muscles were imbricated using 5-0 silk sutures.  These sutures were then clamped to the sterile drapes as bridle sutures.  A limbal peritomy was performed extending two clock hours and hemostasis was obtained with cautery.  A partial thickness scleral groove was made at the surgical limbus and dissected anteriorly in a lamellar dissection using an Alcon crescent knife.  The anterior chamber was entered superonasally with a Superblade and through the lamellar dissection with a 2.6 mm keratome.  DisCoVisc was used to replace the aqueous and a continuous tear capsulorrhexis was carried out.  Hydrodissection and hydrodelineation were carried out with balanced salt and a 27 gauge canula.  The nucleus was rotated to confirm the effectiveness of the hydrodissection.  Phacoemulsification was carried out using a divide-and-conquer technique.  Total ultrasound time was 1 minute and 5.7 seconds with an average power of 19.9%. CDE 24.62.  Irrigation/aspiration was used to remove the residual cortex.  DisCoVisc was used to inflate the capsule and the internal incision was enlarged to 3 mm with the  crescent knife.  The intraocular lens was folded and inserted into the capsular bag using the AcrySert delivery system.  Irrigation/aspiration was used to remove the residual DisCoVisc.  Miostat was injected into the anterior chamber through the paracentesis track to inflate the anterior chamber and induce miosis.  A tenth of a mL of cefuroxime containing 1 mg of the drug was injected via the paracentesis track. The wound was checked for leaks and none were found. The conjunctiva was closed with cautery and the bridle sutures were removed.  Two drops of 0.3% Vigamox were placed on the eye.   An eye shield was placed on the eye.  The patient was discharged to the recovery room in good condition.  ___________________________ Loura Back Asra Gambrel, MD sad:aw D: 05/14/2013 13:57:54 ET T: 05/14/2013 14:32:00 ET JOB#: [id number]  cc: Remo Lipps A. Dartanian Knaggs, MD, <Dictator> Martie Lee MD ELECTRONICALLY SIGNED 05/21/2013 13:52

## 2015-01-24 NOTE — Op Note (Signed)
PATIENT NAME:  Anita Benjamin, Anita Benjamin MR#:  [id number] DATE OF BIRTH:  [birth year]  DATE OF PROCEDURE:  06/25/2013  PREOPERATIVE DIAGNOSIS:  Cataract, left eye.    POSTOPERATIVE DIAGNOSIS:  Cataract, left eye.  PROCEDURE PERFORMED:  Extracapsular cataract extraction using phacoemulsification with placement of an Alcon SN6CWS 21.5-diopter posterior chamber lens, serial #[id number].  SURGEON:  Loura Back. Akia Desroches, MD  ASSISTANT:  None.  ANESTHESIA:  4% lidocaine and 0.75% Marcaine in a 50/50 mixture with 10 units/mL of Hylenex added, given as a peribulbar.   ANESTHESIOLOGIST:  Dr. Benjamine Mola.  COMPLICATIONS:  None.  ESTIMATED BLOOD LOSS:  Less than 1 ml.  DESCRIPTION OF PROCEDURE:  The patient was brought to the operating room and given a peribulbar block.  The patient was then prepped and draped in the usual fashion.  The vertical rectus muscles were imbricated using 5-0 silk sutures.  These sutures were then clamped to the sterile drapes as bridle sutures.  A limbal peritomy was performed extending two clock hours and hemostasis was obtained with cautery.  A partial thickness scleral groove was made at the surgical limbus and dissected anteriorly in a lamellar dissection using an Alcon crescent knife.  The anterior chamber was entered supero-temporally with a Superblade and through the lamellar dissection with a 2.6 mm keratome.  DisCoVisc was used to replace the aqueous and a continuous tear capsulorrhexis was carried out.  Hydrodissection and hydrodelineation were carried out with balanced salt and a 27 gauge canula.  The nucleus was rotated to confirm the effectiveness of the hydrodissection.  Phacoemulsification was carried out using a divide-and-conquer technique.  Total ultrasound time was 1 minute and 13 seconds with an average power of 18.4 percent and CDE of 25.08.  Irrigation/aspiration was used to remove the residual cortex.  DisCoVisc was used to inflate the capsule and the internal incision  was enlarged to 3 mm with the crescent knife.  The intraocular lens was folded and inserted into the capsular bag using the AcrySert delivery system.  Irrigation/aspiration was used to remove the residual DisCoVisc.  Miostat was injected into the anterior chamber through the paracentesis track to inflate the anterior chamber and induce miosis.  A tenth of a milliliter of cefuroxime was placed into the anterior chamber via the paracentesis tract maintaining 1 mg of the drug. The wound was checked for leaks and none were found. The conjunctiva was closed with cautery and the bridle sutures were removed.  Two drops of 0.3% Vigamox were placed on the eye.   An eye shield was placed on the eye.  The patient was discharged to the recovery room in good condition.  ____________________________ Loura Back Santiaga Butzin, MD sad:sb D: 06/25/2013 13:15:44 ET T: 06/25/2013 14:13:39 ET JOB#: [id number]  cc: Remo Lipps A. Yandel Zeiner, MD, <Dictator> Martie Lee MD ELECTRONICALLY SIGNED 07/02/2013 13:20

## 2015-01-27 ENCOUNTER — Other Ambulatory Visit: Payer: Self-pay

## 2015-01-27 MED ORDER — SERTRALINE HCL 50 MG PO TABS: ORAL_TABLET | ORAL | Status: DC

## 2015-02-07 ENCOUNTER — Encounter: Payer: Self-pay | Admitting: Cardiovascular Disease

## 2015-02-07 ENCOUNTER — Ambulatory Visit (INDEPENDENT_AMBULATORY_CARE_PROVIDER_SITE_OTHER): Payer: Medicare Other | Admitting: Cardiovascular Disease

## 2015-02-07 VITALS — BP 142/70 | HR 62 | Ht 68.0 in | Wt 124.0 lb

## 2015-02-07 DIAGNOSIS — I10 Essential (primary) hypertension: Secondary | ICD-10-CM | POA: Diagnosis not present

## 2015-02-07 DIAGNOSIS — E7800 Pure hypercholesterolemia, unspecified: Secondary | ICD-10-CM

## 2015-02-07 DIAGNOSIS — R110 Nausea: Secondary | ICD-10-CM

## 2015-02-07 DIAGNOSIS — I483 Typical atrial flutter: Secondary | ICD-10-CM | POA: Diagnosis not present

## 2015-02-07 DIAGNOSIS — I4891 Unspecified atrial fibrillation: Secondary | ICD-10-CM

## 2015-02-07 NOTE — Assessment & Plan Note (Signed)
Currently doing well off the amiodarone and metoprolol We have recommended if she has recurrent arrhythmia, she could take metoprolol and amiodarone as needed If she starts to have frequent episodes, may need to restart her medications

## 2015-02-07 NOTE — Assessment & Plan Note (Signed)
Blood pressure is well controlled on today's visit. No changes made to the medications. 

## 2015-02-07 NOTE — Assessment & Plan Note (Signed)
Nausea has resolved on today's visit. Unclear if this was by stopping the amiodarone and metoprolol. Will use the medications as needed

## 2015-02-07 NOTE — Patient Instructions (Signed)
You are doing well. No medication changes were made.  If you have severe tachycardia that does not resolve, Take one metoprolol pill and take 2 of the amiodarone pills Lay on the bed and wait for it to go back to normal OK to call the office  Please call us if you have new issues that need to be addressed before your next appt.  Your physician wants you to follow-up in: 6 months.  You will receive a reminder letter in the mail two months in advance. If you don't receive a letter, please call our office to schedule the follow-up appointment.

## 2015-02-07 NOTE — Progress Notes (Signed)
Patient ID: Anita Benjamin, female    DOB: [birth year], 81 y.o.   MRN: [id number]  HPI Comments: Anita Benjamin is a very pleasant 81 year old woman, patient of Dr. Nicki Reaper, with a history of tachycardia, SVT episodes, atrial flutter, hypotension. She reports several year history, possibly even up to 10 years of tachyarrhythmia. She presents today for follow-up of her tachycardia  In follow-up today, she reports that she is taking amiodarone and metoprolol only as needed She denies any significant tachycardia She had one episode last Sunday when she did not eat, was rushing, went to church and then had a funeral, ate only a cracker. She felt shaky, had brief period of tachycardia. ate food after the service Other than that she's had no issues Trying to eat more to put on weight She reports nausea has resolved  EKG on today's visit shows normal sinus rhythm with rate 62 bpm, APCs in a bigeminal pattern  Other past medical history On prior office visit she had atrial flutter with ventricular rate 130 bpm. She is not had any recurrent symptoms as she feels very jittery when she is in atrial flutter  In terms of her family history, father had lung cancer, possible heart issues, died at age 9. Mother had a stroke, died at 22  In terms of her social history, she is not a smoker with no smoking history, lives at home with her husband. Is retired Total cholesterol 211  Allergies  Allergen Reactions  . Oruvail [Ketoprofen]   . Relafen [Nabumetone]   . Timentin [Ticarcillin-Pot Clavulanate]     Outpatient Encounter Prescriptions as of 02/07/2015  Medication Sig  . amiodarone (PACERONE) 200 MG tablet Take 1 tablet (200 mg total) by mouth 2 (two) times daily. (Patient taking differently: Take 200 mg by mouth 2 (two) times daily as needed. )  . Ascorbic Acid (VITAMIN C) 500 MG CAPS Take 500 mg by mouth daily.  Marland Kitchen aspirin 81 MG tablet Take 81 mg by mouth daily.  . Calcium-Magnesium-Vitamin D (CALCIUM 500  PO) Take by mouth daily.  . fish oil-omega-3 fatty acids 1000 MG capsule Take 1 g by mouth daily.   . Flaxseed, Linseed, (FLAX SEEDS PO) Take by mouth daily.   . irbesartan (AVAPRO) 150 MG tablet Take 1 tablet (150 mg total) by mouth daily.  . metoprolol succinate (TOPROL-XL) 50 MG 24 hr tablet Take 1 tablet (50 mg total) by mouth daily. Take with or immediately following a meal. (Patient taking differently: Take 50 mg by mouth daily as needed. Take with or immediately following a meal.)  . Multiple Vitamin (MULTIVITAMIN) tablet Take 1 tablet by mouth daily.  . ranitidine (ZANTAC) 150 MG capsule Take 150 mg by mouth 2 (two) times daily.  . Selenium (SELENICAPS-200 PO) Take 200 mg by mouth daily.  . sertraline (ZOLOFT) 50 MG tablet 1/2 tablet q day x 1 week and then 1 tablet q day   No facility-administered encounter medications on file as of 02/07/2015.    Past Medical History  Diagnosis Date  . Hypercholesterolemia   . Hypertension   . Anemia   . Fibrocystic breast disease   . History of chicken pox     Past Surgical History  Procedure Laterality Date  . Laser repair of torn retina      Social History  reports that she has never smoked. She has never used smokeless tobacco. She reports that she does not drink alcohol or use illicit drugs.  Family History family history  includes Breast cancer in her paternal aunt; Cancer (age of onset: 26) in her father; Heart disease (age of onset: 49) in her mother. There is no history of Colon cancer.  Review of Systems  Constitutional: Negative.   Respiratory: Negative.   Cardiovascular: Positive for palpitations.  Gastrointestinal: Positive for nausea.  Musculoskeletal: Negative.   Skin: Negative.   Neurological: Negative.   Hematological: Negative.   Psychiatric/Behavioral: Negative.   All other systems reviewed and are negative.   BP 142/70 mmHg  Pulse 62  Ht 5\' 8"  (1.727 m)  Wt 124 lb (56.246 kg)  BMI 18.86 kg/m2  LMP  10/27/1989  Physical Exam  Constitutional: She is oriented to person, place, and time. She appears well-developed and well-nourished.  HENT:  Head: Normocephalic.  Nose: Nose normal.  Mouth/Throat: Oropharynx is clear and moist.  Eyes: Conjunctivae are normal. Pupils are equal, round, and reactive to light.  Neck: Normal range of motion. Neck supple. No JVD present.  Cardiovascular: Regular rhythm, S1 normal, S2 normal, normal heart sounds and intact distal pulses.  Tachycardia present.  Exam reveals no gallop and no friction rub.   No murmur heard. Pulmonary/Chest: Effort normal and breath sounds normal. No respiratory distress. She has no wheezes. She has no rales. She exhibits no tenderness.  Abdominal: Soft. Bowel sounds are normal. She exhibits no distension. There is no tenderness.  Musculoskeletal: Normal range of motion. She exhibits no edema or tenderness.  Lymphadenopathy:    She has no cervical adenopathy.  Neurological: She is alert and oriented to person, place, and time. Coordination normal.  Skin: Skin is warm and dry. No rash noted. No erythema.  Psychiatric: She has a normal mood and affect. Her behavior is normal. Judgment and thought content normal.    Assessment and Plan  Nursing note and vitals reviewed.

## 2015-02-07 NOTE — Assessment & Plan Note (Signed)
Currently not on a statin. No known coronary artery disease

## 2015-02-12 ENCOUNTER — Ambulatory Visit: Payer: Medicare Other | Admitting: Cardiovascular Disease

## 2015-02-27 ENCOUNTER — Other Ambulatory Visit: Payer: Self-pay | Admitting: *Deleted

## 2015-02-27 MED ORDER — SERTRALINE HCL 50 MG PO TABS: 50.0000 mg | ORAL_TABLET | Freq: Every day | ORAL | Status: DC

## 2015-03-17 ENCOUNTER — Other Ambulatory Visit (INDEPENDENT_AMBULATORY_CARE_PROVIDER_SITE_OTHER): Payer: Medicare Other

## 2015-03-17 DIAGNOSIS — I10 Essential (primary) hypertension: Secondary | ICD-10-CM

## 2015-03-17 DIAGNOSIS — R7989 Other specified abnormal findings of blood chemistry: Secondary | ICD-10-CM

## 2015-03-17 DIAGNOSIS — R945 Abnormal results of liver function studies: Secondary | ICD-10-CM

## 2015-03-17 LAB — LIPID PANEL
Cholesterol: 196 mg/dL (ref 0–200)
HDL: 69.5 mg/dL (ref >39.00)
LDL Cholesterol: 109 mg/dL — ABNORMAL HIGH (ref 0–99)
NonHDL: 126.5
Total CHOL/HDL Ratio: 3
Triglycerides: 86 mg/dL (ref 0.0–149.0)
VLDL: 17.2 mg/dL (ref 0.0–40.0)

## 2015-03-17 LAB — HEPATIC FUNCTION PANEL
ALT: 19 U/L (ref 0–35)
AST: 20 U/L (ref 0–37)
Albumin: 4.1 g/dL (ref 3.5–5.2)
Alkaline Phosphatase: 61 U/L (ref 39–117)
Bilirubin, Direct: 0.1 mg/dL (ref 0.0–0.3)
Total Bilirubin: 0.7 mg/dL (ref 0.2–1.2)
Total Protein: 6.5 g/dL (ref 6.0–8.3)

## 2015-03-17 LAB — BASIC METABOLIC PANEL
BUN: 17 mg/dL (ref 6–23)
CO2: 30 mEq/L (ref 19–32)
Calcium: 9.4 mg/dL (ref 8.4–10.5)
Chloride: 105 mEq/L (ref 96–112)
Creatinine, Ser: 0.95 mg/dL (ref 0.40–1.20)
GFR: 60.31 mL/min (ref >60.00)
Glucose, Bld: 93 mg/dL (ref 70–99)
Potassium: 4.4 mEq/L (ref 3.5–5.1)
Sodium: 139 mEq/L (ref 135–145)

## 2015-03-19 ENCOUNTER — Encounter: Payer: Self-pay | Admitting: Internal Medicine

## 2015-03-19 ENCOUNTER — Ambulatory Visit
Admission: RE | Admit: 2015-03-19 | Discharge: 2015-03-19 | Disposition: A | Discharge status: Home / Self Care | Payer: Medicare Other | Source: Ambulatory Visit | Attending: Internal Medicine | Admitting: Internal Medicine

## 2015-03-19 ENCOUNTER — Ambulatory Visit (INDEPENDENT_AMBULATORY_CARE_PROVIDER_SITE_OTHER): Payer: Medicare Other | Admitting: Internal Medicine

## 2015-03-19 VITALS — BP 160/76 | HR 59 | Temp 98.1°F | Resp 18 | Ht 68.0 in | Wt 123.2 lb

## 2015-03-19 DIAGNOSIS — S59202A Unspecified physeal fracture of lower end of radius, left arm, initial encounter for closed fracture: Secondary | ICD-10-CM | POA: Diagnosis not present

## 2015-03-19 DIAGNOSIS — F4329 Adjustment disorder with other symptoms: Secondary | ICD-10-CM

## 2015-03-19 DIAGNOSIS — Z658 Other specified problems related to psychosocial circumstances: Secondary | ICD-10-CM

## 2015-03-19 DIAGNOSIS — Z860100 Personal history of colon polyps, unspecified: Secondary | ICD-10-CM

## 2015-03-19 DIAGNOSIS — G479 Sleep disorder, unspecified: Secondary | ICD-10-CM

## 2015-03-19 DIAGNOSIS — F439 Reaction to severe stress, unspecified: Secondary | ICD-10-CM

## 2015-03-19 DIAGNOSIS — W19XXXA Unspecified fall, initial encounter: Secondary | ICD-10-CM | POA: Insufficient documentation

## 2015-03-19 DIAGNOSIS — R55 Syncope and collapse: Secondary | ICD-10-CM

## 2015-03-19 DIAGNOSIS — I10 Essential (primary) hypertension: Secondary | ICD-10-CM

## 2015-03-19 DIAGNOSIS — M25532 Pain in left wrist: Secondary | ICD-10-CM

## 2015-03-19 DIAGNOSIS — E7800 Pure hypercholesterolemia, unspecified: Secondary | ICD-10-CM

## 2015-03-19 DIAGNOSIS — R928 Other abnormal and inconclusive findings on diagnostic imaging of breast: Secondary | ICD-10-CM

## 2015-03-19 DIAGNOSIS — Z0000 Encounter for general adult medical examination without abnormal findings: Secondary | ICD-10-CM

## 2015-03-19 DIAGNOSIS — I483 Typical atrial flutter: Secondary | ICD-10-CM | POA: Diagnosis not present

## 2015-03-19 NOTE — Progress Notes (Signed)
Pre visit review using our clinic review tool, if applicable. No additional management support is needed unless otherwise documented below in the visit note. 

## 2015-03-19 NOTE — Progress Notes (Signed)
Patient ID: Anita Benjamin, female   DOB: 1934/05/01, 81 y.o.   MRN: [id number]   Subjective:    Patient ID: Anita Benjamin, female    DOB: 1934/09/10, 81 y.o.   MRN: [id number]  HPI  Patient here for a scheduled follow up.  She is seeing Dr Rockey Situ for her atrial flutter.  Off amiodarone and metoprolol.  Feels better off.  On mother's day, she went to a movie.  As she was walking out in the dark, she tripped and fell.  Fell forward.  Hit her left hand.  Had pain in her left wrist and left arm.  Some bruising.  Taking tylenol.  After she returned home, ate and went to the bathroom.  She found herself on the floor after this.  Not sure what happened.  No chest pain or tightness.  No increased sob.  Overall heart rate better.  No other syncope or near syncope.  Stays active.  No sob.  If she gets up too fast will notice some light headedness.  No headache.  Has been sleeping ok.     Past Medical History  Diagnosis Date  . Hypercholesterolemia   . Hypertension   . Anemia   . Fibrocystic breast disease   . History of chicken pox     Current Outpatient Prescriptions on File Prior to Visit  Medication Sig Dispense Refill  . amiodarone (PACERONE) 200 MG tablet Take 1 tablet (200 mg total) by mouth 2 (two) times daily. (Patient taking differently: Take 200 mg by mouth 2 (two) times daily as needed. ) 70 tablet 3  . Ascorbic Acid (VITAMIN C) 500 MG CAPS Take 500 mg by mouth daily.    Marland Kitchen aspirin 81 MG tablet Take 81 mg by mouth daily.    . Calcium-Magnesium-Vitamin D (CALCIUM 500 PO) Take by mouth daily.    . fish oil-omega-3 fatty acids 1000 MG capsule Take 1 g by mouth daily.     . Flaxseed, Linseed, (FLAX SEEDS PO) Take by mouth daily.     . irbesartan (AVAPRO) 150 MG tablet Take 1 tablet (150 mg total) by mouth daily. 30 tablet 5  . metoprolol succinate (TOPROL-XL) 50 MG 24 hr tablet Take 1 tablet (50 mg total) by mouth daily. Take with or immediately following a meal. (Patient taking differently: Take 50  mg by mouth daily as needed. Take with or immediately following a meal.) 30 tablet 11  . Multiple Vitamin (MULTIVITAMIN) tablet Take 1 tablet by mouth daily.    . ranitidine (ZANTAC) 150 MG capsule Take 150 mg by mouth 2 (two) times daily.    . Selenium (SELENICAPS-200 PO) Take 200 mg by mouth daily.    . sertraline (ZOLOFT) 50 MG tablet Take 1 tablet (50 mg total) by mouth daily. 30 tablet 2   No current facility-administered medications on file prior to visit.    Review of Systems  Constitutional: Negative for appetite change and unexpected weight change.  HENT: Negative for congestion and sinus pressure.   Respiratory: Negative for cough, chest tightness and shortness of breath.   Cardiovascular: Negative for chest pain and leg swelling.  Gastrointestinal: Negative for nausea, vomiting, abdominal pain and diarrhea.  Musculoskeletal:       Increased pain left wrist s/p fall.    Neurological: Positive for dizziness and light-headedness. Negative for headaches.  Psychiatric/Behavioral: Negative for dysphoric mood and agitation.       Objective:     Blood pressure recheck:  120/70  Physical Exam  Constitutional: She appears well-developed and well-nourished. No distress.  HENT:  Nose: Nose normal.  Mouth/Throat: Oropharynx is clear and moist.  Neck: Neck supple. No thyromegaly present.  Cardiovascular: Normal rate and regular rhythm.   Pulmonary/Chest: Breath sounds normal. No respiratory distress. She has no wheezes.  Abdominal: Soft. Bowel sounds are normal. There is no tenderness.  Musculoskeletal: She exhibits no edema.  Increased tenderness to palpation over the left wrist.  Increased pain with flexion of left wrist.    Lymphadenopathy:    She has no cervical adenopathy.  Skin: No rash noted. No erythema.  Psychiatric: She has a normal mood and affect. Her behavior is normal.    BP 160/76 mmHg  Pulse 59  Temp(Src) 98.1 F (36.7 C) (Oral)  Resp 18  Ht 5\' 8"  (1.727  m)  Wt 123 lb 4 oz (55.906 kg)  BMI 18.74 kg/m2  SpO2 99%  LMP 10/27/1989 Wt Readings from Last 3 Encounters:  03/19/15 123 lb 4 oz (55.906 kg)  02/07/15 124 lb (56.246 kg)  01/08/15 123 lb 8 oz (56.019 kg)     Lab Results  Component Value Date   WBC 4.6 07/04/2014   HGB 14.2 07/04/2014   HCT 41.8 07/04/2014   PLT 258.0 07/04/2014   GLUCOSE 93 03/17/2015   CHOL 196 03/17/2015   TRIG 86.0 03/17/2015   HDL 69.50 03/17/2015   LDLDIRECT 117.7 11/06/2013   LDLCALC 109* 03/17/2015   ALT 19 03/17/2015   AST 20 03/17/2015   NA 139 03/17/2015   K 4.4 03/17/2015   CL 105 03/17/2015   CREATININE 0.95 03/17/2015   BUN 17 03/17/2015   CO2 30 03/17/2015   TSH 2.90 11/28/2014       Assessment & Plan:   Problem List Items Addressed This Visit    Abnormal mammogram    Mammogram 01/02/15 - ok.        Adjustment disorder    On zoloft.        Atrial flutter    Followed by Dr Rockey Situ.   Of amiodarone and metoprolol.        Difficulty sleeping    Sleeping better.  Follow.        Health care maintenance    PAP 10/27/12 - negative with negative HPV.  Mammogram 01/02/15 - ok.        History of colonic polyps    Colonoscopy 10/2004.  Recommended f/u colonoscopy in 10 years.       Hypercholesterolemia    Low cholesterol diet.  Follow lipid panel.       Hypertension    Blood pressure on recheck improved.  Has been doing well.  Same medication regimen.        Left wrist pain - Primary    Pain s/p fall.  Check xray.  Ace bandage.        Relevant Orders   DG Wrist Complete Left (Completed)   Stress    Increased stress.  On zoloft.  Appears to be doing better.  Follow.        Syncope    She felt was related to the pain.  Discussed further w/up for etiology.  She declines.  Wants to monitor.  Will notify me or be reevaluated if any problems.          I spent 25 minutes with the patient and more than 50% of the time was spent in consultation regarding the above.      Einar Pheasant, MD

## 2015-03-20 ENCOUNTER — Other Ambulatory Visit: Payer: Self-pay | Admitting: Internal Medicine

## 2015-03-20 DIAGNOSIS — S62109A Fracture of unspecified carpal bone, unspecified wrist, initial encounter for closed fracture: Secondary | ICD-10-CM

## 2015-03-20 NOTE — Progress Notes (Signed)
Order placed for referral to ortho

## 2015-03-25 ENCOUNTER — Encounter: Payer: Self-pay | Admitting: Internal Medicine

## 2015-03-25 DIAGNOSIS — M25532 Pain in left wrist: Secondary | ICD-10-CM | POA: Insufficient documentation

## 2015-03-25 DIAGNOSIS — R55 Syncope and collapse: Secondary | ICD-10-CM | POA: Insufficient documentation

## 2015-03-25 NOTE — Assessment & Plan Note (Signed)
Mammogram 01/02/15 - ok.

## 2015-03-25 NOTE — Assessment & Plan Note (Signed)
Blood pressure on recheck improved.  Has been doing well.  Same medication regimen.

## 2015-03-25 NOTE — Assessment & Plan Note (Signed)
On zoloft

## 2015-03-25 NOTE — Assessment & Plan Note (Signed)
Low cholesterol diet.  Follow lipid panel.    

## 2015-03-25 NOTE — Assessment & Plan Note (Signed)
Sleeping better.  Follow.  

## 2015-03-25 NOTE — Assessment & Plan Note (Signed)
Colonoscopy 10/2004.  Recommended f/u colonoscopy in 10 years.

## 2015-03-25 NOTE — Assessment & Plan Note (Signed)
Followed by Dr Rockey Situ.   Of amiodarone and metoprolol.

## 2015-03-25 NOTE — Assessment & Plan Note (Signed)
PAP 10/27/12 - negative with negative HPV.  Mammogram 01/02/15 - ok.

## 2015-03-25 NOTE — Assessment & Plan Note (Signed)
Pain s/p fall.  Check xray.  Ace bandage.

## 2015-03-25 NOTE — Assessment & Plan Note (Signed)
Increased stress.  On zoloft.  Appears to be doing better.  Follow.

## 2015-03-25 NOTE — Assessment & Plan Note (Signed)
She felt was related to the pain.  Discussed further w/up for etiology.  She declines.  Wants to monitor.  Will notify me or be reevaluated if any problems.

## 2015-03-31 ENCOUNTER — Other Ambulatory Visit: Payer: Self-pay | Admitting: *Deleted

## 2015-05-22 ENCOUNTER — Other Ambulatory Visit: Payer: Self-pay

## 2015-05-22 ENCOUNTER — Other Ambulatory Visit: Payer: Self-pay | Admitting: Surgical

## 2015-05-22 MED ORDER — SERTRALINE HCL 50 MG PO TABS: 50.0000 mg | ORAL_TABLET | Freq: Every day | ORAL | Status: DC

## 2015-05-22 MED ORDER — IRBESARTAN 150 MG PO TABS: 150.0000 mg | ORAL_TABLET | Freq: Every day | ORAL | Status: DC

## 2015-05-22 NOTE — Telephone Encounter (Signed)
Patient walked into office for RX refill of Sertraline. I phoned in RX to [facility] in [place]

## 2015-06-05 ENCOUNTER — Ambulatory Visit
Admission: RE | Admit: 2015-06-05 | Discharge: 2015-06-05 | Disposition: A | Discharge status: Home / Self Care | Payer: Medicare Other | Source: Ambulatory Visit | Attending: Internal Medicine | Admitting: Internal Medicine

## 2015-06-05 ENCOUNTER — Encounter: Payer: Self-pay | Admitting: Internal Medicine

## 2015-06-05 ENCOUNTER — Ambulatory Visit (INDEPENDENT_AMBULATORY_CARE_PROVIDER_SITE_OTHER): Payer: Medicare Other | Admitting: Internal Medicine

## 2015-06-05 VITALS — BP 118/70 | HR 56 | Temp 98.2°F | Ht 68.0 in | Wt 125.1 lb

## 2015-06-05 DIAGNOSIS — Z658 Other specified problems related to psychosocial circumstances: Secondary | ICD-10-CM

## 2015-06-05 DIAGNOSIS — M47892 Other spondylosis, cervical region: Secondary | ICD-10-CM | POA: Insufficient documentation

## 2015-06-05 DIAGNOSIS — I483 Typical atrial flutter: Secondary | ICD-10-CM

## 2015-06-05 DIAGNOSIS — E7800 Pure hypercholesterolemia, unspecified: Secondary | ICD-10-CM

## 2015-06-05 DIAGNOSIS — I10 Essential (primary) hypertension: Secondary | ICD-10-CM | POA: Diagnosis not present

## 2015-06-05 DIAGNOSIS — F4329 Adjustment disorder with other symptoms: Secondary | ICD-10-CM | POA: Diagnosis not present

## 2015-06-05 DIAGNOSIS — G479 Sleep disorder, unspecified: Secondary | ICD-10-CM

## 2015-06-05 DIAGNOSIS — M4312 Spondylolisthesis, cervical region: Secondary | ICD-10-CM | POA: Insufficient documentation

## 2015-06-05 DIAGNOSIS — Z23 Encounter for immunization: Secondary | ICD-10-CM | POA: Diagnosis not present

## 2015-06-05 DIAGNOSIS — M542 Cervicalgia: Secondary | ICD-10-CM | POA: Insufficient documentation

## 2015-06-05 DIAGNOSIS — D72819 Decreased white blood cell count, unspecified: Secondary | ICD-10-CM

## 2015-06-05 DIAGNOSIS — F439 Reaction to severe stress, unspecified: Secondary | ICD-10-CM

## 2015-06-05 NOTE — Patient Instructions (Signed)
Decrease zoloft to 1/2 tablet per day 

## 2015-06-05 NOTE — Progress Notes (Signed)
Patient ID: Anita Benjamin, female   DOB: 07/14/1934, 81 y.o.   MRN: [id number]   Subjective:    Patient ID: Anita Benjamin, female    DOB: 27-Apr-1934, 81 y.o.   MRN: [id number]  HPI  Patient here for a scheduled follow up.  She has been having problems with increased pain in her neck and shoulders.  Head will feel numb at times.  Some decreased ambition.  Sleeps a lot.  Was questioning if the zoloft is contributing to these symptoms.  She feels better on the zoloft - more level.  Denies depression.  Tries to stay active.  No cardiac symptoms with increased activity or exertion.  No sob.  No acid reflux reported.  No abdominal pain or cramping.  Bowels stable.     Past Medical History  Diagnosis Date  . Hypercholesterolemia   . Hypertension   . Anemia   . Fibrocystic breast disease   . History of chicken pox    Past Surgical History  Procedure Laterality Date  . Laser repair of torn retina     Family History  Problem Relation Age of Onset  . Heart disease Mother 21    Heart attack  . Cancer Father 28    lung cancer  . Breast cancer Paternal Aunt     x3  . Colon cancer Neg Hx    Social History   Social History  . Marital Status: Widowed    Spouse Name: N/A  . Number of Children: 2  . Years of Education: N/A   Social History Main Topics  . Smoking status: Never Smoker   . Smokeless tobacco: Never Used  . Alcohol Use: No  . Drug Use: No  . Sexual Activity: Not Asked   Other Topics Concern  . None   Social History Narrative    Outpatient Encounter Prescriptions as of 06/05/2015  Medication Sig  . amiodarone (PACERONE) 200 MG tablet Take 1 tablet (200 mg total) by mouth 2 (two) times daily. (Patient taking differently: Take 200 mg by mouth 2 (two) times daily as needed. )  . Ascorbic Acid (VITAMIN C) 500 MG CAPS Take 500 mg by mouth daily.  Marland Kitchen aspirin 81 MG tablet Take 81 mg by mouth daily.  . Calcium-Magnesium-Vitamin D (CALCIUM 500 PO) Take by mouth daily.  . fish  oil-omega-3 fatty acids 1000 MG capsule Take 1 g by mouth daily.   . Flaxseed, Linseed, (FLAX SEEDS PO) Take by mouth daily.   . irbesartan (AVAPRO) 150 MG tablet Take 1 tablet (150 mg total) by mouth daily.  . metoprolol succinate (TOPROL-XL) 50 MG 24 hr tablet Take 1 tablet (50 mg total) by mouth daily. Take with or immediately following a meal. (Patient taking differently: Take 50 mg by mouth daily as needed. Take with or immediately following a meal.)  . Multiple Vitamin (MULTIVITAMIN) tablet Take 1 tablet by mouth daily.  . ranitidine (ZANTAC) 150 MG capsule Take 150 mg by mouth 2 (two) times daily.  . Selenium (SELENICAPS-200 PO) Take 200 mg by mouth daily.  . sertraline (ZOLOFT) 50 MG tablet Take 1 tablet (50 mg total) by mouth daily.   No facility-administered encounter medications on file as of 06/05/2015.    Review of Systems  Constitutional: Positive for fatigue. Negative for appetite change and unexpected weight change.  HENT: Negative for congestion and sinus pressure.   Respiratory: Negative for cough, chest tightness and shortness of breath.   Cardiovascular: Negative for chest pain, palpitations and leg  swelling.  Gastrointestinal: Negative for nausea, vomiting, abdominal pain and diarrhea.  Genitourinary: Negative for dysuria and difficulty urinating.  Musculoskeletal: Positive for neck pain. Negative for joint swelling.  Skin: Negative for color change and rash.  Neurological: Negative for dizziness, light-headedness and headaches.  Hematological: Negative for adenopathy. Does not bruise/bleed easily.  Psychiatric/Behavioral: Negative for dysphoric mood and agitation.       Objective:    Physical Exam  Constitutional: She appears well-developed and well-nourished. No distress.  HENT:  Nose: Nose normal.  Mouth/Throat: Oropharynx is clear and moist.  Eyes: Conjunctivae are normal. Right eye exhibits no discharge. Left eye exhibits no discharge.  Neck: Neck supple. No  thyromegaly present.  Cardiovascular: Normal rate and regular rhythm.   Pulmonary/Chest: Breath sounds normal. No respiratory distress. She has no wheezes.  Abdominal: Soft. Bowel sounds are normal. There is no tenderness.  Musculoskeletal: She exhibits no edema or tenderness.  Increased discomfort with rotation of her head - left to right.  Pulling sensation - posterior/lateral neck.    Lymphadenopathy:    She has no cervical adenopathy.  Skin: No rash noted. No erythema.  Psychiatric: She has a normal mood and affect. Her behavior is normal.    BP 118/70 mmHg  Pulse 56  Temp(Src) 98.2 F (36.8 C) (Oral)  Ht 5\' 8"  (1.727 m)  Wt 125 lb 2 oz (56.756 kg)  BMI 19.03 kg/m2  SpO2 97%  LMP 10/27/1989 Wt Readings from Last 3 Encounters:  06/05/15 125 lb 2 oz (56.756 kg)  03/19/15 123 lb 4 oz (55.906 kg)  02/07/15 124 lb (56.246 kg)     Lab Results  Component Value Date   WBC 4.6 07/04/2014   HGB 14.2 07/04/2014   HCT 41.8 07/04/2014   PLT 258.0 07/04/2014   GLUCOSE 93 03/17/2015   CHOL 196 03/17/2015   TRIG 86.0 03/17/2015   HDL 69.50 03/17/2015   LDLDIRECT 117.7 11/06/2013   LDLCALC 109* 03/17/2015   ALT 19 03/17/2015   AST 20 03/17/2015   NA 139 03/17/2015   K 4.4 03/17/2015   CL 105 03/17/2015   CREATININE 0.95 03/17/2015   BUN 17 03/17/2015   CO2 30 03/17/2015   TSH 2.90 11/28/2014       Assessment & Plan:   Problem List Items Addressed This Visit    Adjustment disorder    Has been on zoloft.  Does feel like things are more level.  She feels fatigued and sleeps a lot.  Decreased ambition.  Was asking if zoloft could be contributing.  Will decrease zoloft dose to 1/2 (50mg ) q day.  Follow.  Get her back in soon to reassess.        Atrial flutter    Followed by Dr Rockey Situ.  Off amiodarone and metoprolol.        Difficulty sleeping    Not an issue now.  Follow.       Hypercholesterolemia    Follow lipid panel and liver function tests.       Hypertension     Blood pressure under good control.  Continue same medication regimen.  Follow pressures.  Follow metabolic panel.         Leukopenia    Follow cbc.       Neck pain    Neck pain as outlined.  Check c-spine xray.  Further w/up pending results.        Relevant Orders   DG Cervical Spine Complete (Completed)   Stress  Increased stress previously.  Feels she is doing better.  On zoloft.  Will decrease the dose.  Follow.         Other Visit Diagnoses    Encounter for immunization    -  Primary        Einar Pheasant, MD

## 2015-06-05 NOTE — Progress Notes (Signed)
Pre-visit discussion using our clinic review tool. No additional management support is needed unless otherwise documented below in the visit note.  

## 2015-06-09 ENCOUNTER — Encounter: Payer: Self-pay | Admitting: Internal Medicine

## 2015-06-09 DIAGNOSIS — M542 Cervicalgia: Secondary | ICD-10-CM | POA: Insufficient documentation

## 2015-06-09 NOTE — Assessment & Plan Note (Signed)
Follow lipid panel and liver function tests.   

## 2015-06-09 NOTE — Assessment & Plan Note (Signed)
Has been on zoloft.  Does feel like things are more level.  She feels fatigued and sleeps a lot.  Decreased ambition.  Was asking if zoloft could be contributing.  Will decrease zoloft dose to 1/2 (50mg ) q day.  Follow.  Get her back in soon to reassess.

## 2015-06-09 NOTE — Assessment & Plan Note (Signed)
Blood pressure under good control.  Continue same medication regimen.  Follow pressures.  Follow metabolic panel.   

## 2015-06-09 NOTE — Assessment & Plan Note (Signed)
Not an issue now.  Follow.    

## 2015-06-09 NOTE — Assessment & Plan Note (Signed)
Increased stress previously.  Feels she is doing better.  On zoloft.  Will decrease the dose.  Follow.

## 2015-06-09 NOTE — Assessment & Plan Note (Signed)
Follow cbc.  

## 2015-06-09 NOTE — Assessment & Plan Note (Signed)
Neck pain as outlined.  Check c-spine xray.  Further w/up pending results.

## 2015-06-09 NOTE — Assessment & Plan Note (Signed)
Followed by Dr Rockey Situ.  Off amiodarone and metoprolol.

## 2015-07-21 ENCOUNTER — Encounter: Payer: Self-pay | Admitting: Internal Medicine

## 2015-07-21 ENCOUNTER — Ambulatory Visit (INDEPENDENT_AMBULATORY_CARE_PROVIDER_SITE_OTHER): Payer: Medicare Other | Admitting: Internal Medicine

## 2015-07-21 VITALS — BP 110/70 | HR 57 | Temp 98.0°F | Resp 18 | Ht 68.0 in | Wt 125.0 lb

## 2015-07-21 DIAGNOSIS — E7800 Pure hypercholesterolemia, unspecified: Secondary | ICD-10-CM

## 2015-07-21 DIAGNOSIS — I10 Essential (primary) hypertension: Secondary | ICD-10-CM

## 2015-07-21 DIAGNOSIS — R945 Abnormal results of liver function studies: Secondary | ICD-10-CM

## 2015-07-21 DIAGNOSIS — R7989 Other specified abnormal findings of blood chemistry: Secondary | ICD-10-CM | POA: Diagnosis not present

## 2015-07-21 DIAGNOSIS — I483 Typical atrial flutter: Secondary | ICD-10-CM | POA: Diagnosis not present

## 2015-07-21 DIAGNOSIS — R928 Other abnormal and inconclusive findings on diagnostic imaging of breast: Secondary | ICD-10-CM

## 2015-07-21 DIAGNOSIS — M25552 Pain in left hip: Secondary | ICD-10-CM | POA: Diagnosis not present

## 2015-07-21 DIAGNOSIS — F4329 Adjustment disorder with other symptoms: Secondary | ICD-10-CM

## 2015-07-21 DIAGNOSIS — D72819 Decreased white blood cell count, unspecified: Secondary | ICD-10-CM

## 2015-07-21 DIAGNOSIS — M542 Cervicalgia: Secondary | ICD-10-CM

## 2015-07-21 MED ORDER — SERTRALINE HCL 50 MG PO TABS: ORAL_TABLET | ORAL | Status: DC

## 2015-07-21 NOTE — Assessment & Plan Note (Signed)
c-spine xray as outlined.  Pain improved.  Desires no further intervention.  Follow.

## 2015-07-21 NOTE — Assessment & Plan Note (Signed)
Follow cbc.  

## 2015-07-21 NOTE — Assessment & Plan Note (Signed)
Per note, mammogram 01/02/15 - no evidence of malignancy suspected.  Obtain official copy of report.

## 2015-07-21 NOTE — Assessment & Plan Note (Signed)
Followed by Dr Rockey Situ.  Not taking the amiodarone or metoprolol regularly.  Has if needed.  Is doing well.

## 2015-07-21 NOTE — Assessment & Plan Note (Signed)
F/u liver panel wnl.  Recheck with next labs to confirm remaining normal.

## 2015-07-21 NOTE — Progress Notes (Signed)
Pre-visit discussion using our clinic review tool. No additional management support is needed unless otherwise documented below in the visit note.  

## 2015-07-21 NOTE — Progress Notes (Signed)
Patient ID: Anita Benjamin, female   DOB: 01-21-1934, 81 y.o.   MRN: [id number]   Subjective:    Patient ID: Anita Benjamin, female    DOB: [birth year], 81 y.o.   MRN: [id number]  HPI  Patient with past history of atrial flutter, hypertension, adjustment disorder and hypercholesterolemia who comes in today to follow up on these issues.  She is doing better.  Feels better.  The 1/2 (50mg ) zoloft is working better.  Not sluggish.  Eating better.  Appetite better.  No chest pain or tightness.  No sob.  No acid reflux.  Bowels stable.  She does report some persistent discomfort in her left hip.  Only bothers her when pressure is applied to this area.  Notices in bed when lies on her left side.  No pain with walking.  Some pain with palpation over the left lateral hip.  Neck is better.     Past Medical History  Diagnosis Date  . Hypercholesterolemia   . Hypertension   . Anemia   . Fibrocystic breast disease   . History of chicken pox    Past Surgical History  Procedure Laterality Date  . Laser repair of torn retina     Family History  Problem Relation Age of Onset  . Heart disease Mother 45    Heart attack  . Cancer Father 93    lung cancer  . Breast cancer Paternal Aunt     x3  . Colon cancer Neg Hx    Social History   Social History  . Marital Status: Widowed    Spouse Name: N/A  . Number of Children: 2  . Years of Education: N/A   Social History Main Topics  . Smoking status: Never Smoker   . Smokeless tobacco: Never Used  . Alcohol Use: No  . Drug Use: No  . Sexual Activity: Not Asked   Other Topics Concern  . None   Social History Narrative    Outpatient Encounter Prescriptions as of 07/21/2015  Medication Sig  . amiodarone (PACERONE) 200 MG tablet Take 1 tablet (200 mg total) by mouth 2 (two) times daily. (Patient taking differently: Take 200 mg by mouth 2 (two) times daily as needed. )  . Ascorbic Acid (VITAMIN C) 500 MG CAPS Take 500 mg by mouth daily.  Marland Kitchen aspirin 81  MG tablet Take 81 mg by mouth daily.  . Calcium-Magnesium-Vitamin D (CALCIUM 500 PO) Take by mouth daily.  . fish oil-omega-3 fatty acids 1000 MG capsule Take 1 g by mouth daily.   . Flaxseed, Linseed, (FLAX SEEDS PO) Take by mouth daily.   . irbesartan (AVAPRO) 150 MG tablet Take 1 tablet (150 mg total) by mouth daily.  . metoprolol succinate (TOPROL-XL) 50 MG 24 hr tablet Take 1 tablet (50 mg total) by mouth daily. Take with or immediately following a meal. (Patient taking differently: Take 50 mg by mouth daily as needed. Take with or immediately following a meal.)  . Multiple Vitamin (MULTIVITAMIN) tablet Take 1 tablet by mouth daily.  . ranitidine (ZANTAC) 150 MG capsule Take 150 mg by mouth 2 (two) times daily.  . Selenium (SELENICAPS-200 PO) Take 200 mg by mouth daily.  . sertraline (ZOLOFT) 50 MG tablet Take 1/2 tablet q day  . [DISCONTINUED] sertraline (ZOLOFT) 50 MG tablet Take 1 tablet (50 mg total) by mouth daily.   No facility-administered encounter medications on file as of 07/21/2015.    Review of Systems  Constitutional: Negative for unexpected weight change.  Appetite has improved.    HENT: Negative for congestion and sinus pressure.   Respiratory: Negative for cough, chest tightness and shortness of breath.   Cardiovascular: Negative for chest pain, palpitations and leg swelling.  Gastrointestinal: Negative for nausea, vomiting, abdominal pain and diarrhea.  Genitourinary: Negative for dysuria and difficulty urinating.  Musculoskeletal: Positive for neck pain (better.  desires no further intervention at this time. ).  Skin: Negative for color change and rash.  Neurological: Negative for dizziness, light-headedness and headaches.  Psychiatric/Behavioral: Negative for dysphoric mood and agitation.       Stress is better.        Objective:     Blood pressure rechecked by me prior to leaving:  134/78  Physical Exam  Constitutional: She appears well-developed and  well-nourished. No distress.  HENT:  Nose: Nose normal.  Mouth/Throat: Oropharynx is clear and moist.  Eyes: Conjunctivae are normal. Right eye exhibits no discharge. Left eye exhibits no discharge.  Neck: Neck supple. No thyromegaly present.  Cardiovascular: Normal rate and regular rhythm.   Pulmonary/Chest: Breath sounds normal. No respiratory distress. She has no wheezes.  Abdominal: Soft. Bowel sounds are normal. There is no tenderness.  Musculoskeletal: She exhibits no edema or tenderness.  Increased discomfort to palpation over the left lateral hip.  Indention in the skin in the is area with minimal tissue fullness to palpation.  No increased erythema.    Lymphadenopathy:    She has no cervical adenopathy.  Skin: No rash noted. No erythema.  Psychiatric: She has a normal mood and affect. Her behavior is normal.    BP 110/70 mmHg  Pulse 57  Temp(Src) 98 F (36.7 C) (Oral)  Resp 18  Ht 5\' 8"  (1.727 m)  Wt 125 lb (56.7 kg)  BMI 19.01 kg/m2  SpO2 98%  LMP 10/27/1989 Wt Readings from Last 3 Encounters:  07/21/15 125 lb (56.7 kg)  06/05/15 125 lb 2 oz (56.756 kg)  03/19/15 123 lb 4 oz (55.906 kg)     Lab Results  Component Value Date   WBC 4.6 07/04/2014   HGB 14.2 07/04/2014   HCT 41.8 07/04/2014   PLT 258.0 07/04/2014   GLUCOSE 93 03/17/2015   CHOL 196 03/17/2015   TRIG 86.0 03/17/2015   HDL 69.50 03/17/2015   LDLDIRECT 117.7 11/06/2013   LDLCALC 109* 03/17/2015   ALT 19 03/17/2015   AST 20 03/17/2015   NA 139 03/17/2015   K 4.4 03/17/2015   CL 105 03/17/2015   CREATININE 0.95 03/17/2015   BUN 17 03/17/2015   CO2 30 03/17/2015   TSH 2.90 11/28/2014    Dg Cervical Spine Complete  06/05/2015  CLINICAL DATA:  Chronic cervicalgia with radicular symptoms bilaterally EXAM: CERVICAL SPINE  4+ VIEWS COMPARISON:  None. FINDINGS: Frontal, lateral, open-mouth odontoid, and bilateral oblique views were obtained. There is no fracture. There is minimal anterolisthesis of  C4 on C5, felt to be due to underlying spondylosis. There is no other spondylolisthesis. Prevertebral soft tissues and predental space regions are normal. There is slight disc space narrowing at C4-5 and C6-7. There are anterior osteophytes at C4, C5, and C6. There is moderate exit foraminal narrowing at C4-5 bilaterally. There is milder exit foraminal narrowing at C5-6 bilaterally. No erosive change. IMPRESSION: Osteoarthritic change, most notably at C4-5. Slight spondylolisthesis at C4-5 is felt to be due to underlying spondylosis. No other spondylolisthesis. No fracture. Electronically Signed   By: Lowella Grip III M.D.   On: 06/05/2015 15:34  Assessment & Plan:   Problem List Items Addressed This Visit    Abnormal liver function test    F/u liver panel wnl.  Recheck with next labs to confirm remaining normal.       Relevant Orders   Hepatic function panel   Abnormal mammogram    Per note, mammogram 01/02/15 - no evidence of malignancy suspected.  Obtain official copy of report.        Adjustment disorder    Doing well on 1/2 (50mg ) zoloft.  Follow.  Continue same dose.        Atrial flutter ([place])    Followed by Dr Rockey Situ.  Not taking the amiodarone or metoprolol regularly.  Has if needed.  Is doing well.       Hypercholesterolemia    Low cholesterol diet and exercise.  Schedule fasting labs.       Relevant Orders   Lipid panel   Hypertension    Blood pressure under good control.  Continue same medication regimen.  Follow pressures.  Follow metabolic panel.        Relevant Orders   CBC with Differential/Platelet   Basic metabolic panel   Left hip pain - Primary    Discomfort as outlined.  Exam as outlined.  Indention on skin over the palpable area of tenderness with minimal soft tissue fullness noted on exam.  Discussed further w/up.  She agrees for ortho referral.  Wants to hold on xray until ortho evaluates.        Relevant Orders   Ambulatory referral to  Orthopedic Surgery   Leukopenia    Follow cbc.       Neck pain    c-spine xray as outlined.  Pain improved.  Desires no further intervention.  Follow.           Einar Pheasant, MD

## 2015-07-21 NOTE — Assessment & Plan Note (Signed)
Low cholesterol diet and exercise.  Schedule fasting labs.

## 2015-07-21 NOTE — Assessment & Plan Note (Signed)
Doing well on 1/2 (50mg ) zoloft.  Follow.  Continue same dose.

## 2015-07-21 NOTE — Assessment & Plan Note (Signed)
Discomfort as outlined.  Exam as outlined.  Indention on skin over the palpable area of tenderness with minimal soft tissue fullness noted on exam.  Discussed further w/up.  She agrees for ortho referral.  Wants to hold on xray until ortho evaluates.

## 2015-07-21 NOTE — Assessment & Plan Note (Signed)
Blood pressure under good control.  Continue same medication regimen.  Follow pressures.  Follow metabolic panel.   

## 2015-07-23 ENCOUNTER — Other Ambulatory Visit: Payer: Self-pay

## 2015-08-08 ENCOUNTER — Other Ambulatory Visit (INDEPENDENT_AMBULATORY_CARE_PROVIDER_SITE_OTHER): Payer: Medicare Other

## 2015-08-08 DIAGNOSIS — I10 Essential (primary) hypertension: Secondary | ICD-10-CM | POA: Diagnosis not present

## 2015-08-08 DIAGNOSIS — E7800 Pure hypercholesterolemia, unspecified: Secondary | ICD-10-CM

## 2015-08-08 DIAGNOSIS — R945 Abnormal results of liver function studies: Secondary | ICD-10-CM

## 2015-08-08 DIAGNOSIS — R7989 Other specified abnormal findings of blood chemistry: Secondary | ICD-10-CM | POA: Diagnosis not present

## 2015-08-08 LAB — LIPID PANEL
Cholesterol: 206 mg/dL — ABNORMAL HIGH (ref 0–200)
HDL: 67.6 mg/dL (ref >39.00)
LDL Cholesterol: 120 mg/dL — ABNORMAL HIGH (ref 0–99)
NonHDL: 138.15
Total CHOL/HDL Ratio: 3
Triglycerides: 89 mg/dL (ref 0.0–149.0)
VLDL: 17.8 mg/dL (ref 0.0–40.0)

## 2015-08-08 LAB — CBC WITH DIFFERENTIAL/PLATELET
Basophils Absolute: 0 10*3/uL (ref 0.0–0.1)
Basophils Relative: 0.9 % (ref 0.0–3.0)
Eosinophils Absolute: 0.1 10*3/uL (ref 0.0–0.7)
Eosinophils Relative: 3.4 % (ref 0.0–5.0)
HCT: 42 % (ref 36.0–46.0)
Hemoglobin: 14 g/dL (ref 12.0–15.0)
Lymphocytes Relative: 29.5 % (ref 12.0–46.0)
Lymphs Abs: 1.2 10*3/uL (ref 0.7–4.0)
MCHC: 33.3 g/dL (ref 30.0–36.0)
MCV: 94.6 fl (ref 78.0–100.0)
Monocytes Absolute: 0.3 10*3/uL (ref 0.1–1.0)
Monocytes Relative: 8.2 % (ref 3.0–12.0)
Neutro Abs: 2.3 10*3/uL (ref 1.4–7.7)
Neutrophils Relative %: 58 % (ref 43.0–77.0)
Platelets: 275 10*3/uL (ref 150.0–400.0)
RBC: 4.44 Mil/uL (ref 3.87–5.11)
RDW: 13.8 % (ref 11.5–15.5)
WBC: 4 10*3/uL (ref 4.0–10.5)

## 2015-08-08 LAB — BASIC METABOLIC PANEL
BUN: 22 mg/dL (ref 6–23)
CO2: 30 mEq/L (ref 19–32)
Calcium: 9.8 mg/dL (ref 8.4–10.5)
Chloride: 103 mEq/L (ref 96–112)
Creatinine, Ser: 0.97 mg/dL (ref 0.40–1.20)
GFR: 58.82 mL/min — ABNORMAL LOW (ref >60.00)
Glucose, Bld: 90 mg/dL (ref 70–99)
Potassium: 4.5 mEq/L (ref 3.5–5.1)
Sodium: 140 mEq/L (ref 135–145)

## 2015-08-08 LAB — HEPATIC FUNCTION PANEL
ALT: 18 U/L (ref 0–35)
AST: 22 U/L (ref 0–37)
Albumin: 4 g/dL (ref 3.5–5.2)
Alkaline Phosphatase: 59 U/L (ref 39–117)
Bilirubin, Direct: 0.1 mg/dL (ref 0.0–0.3)
Total Bilirubin: 0.6 mg/dL (ref 0.2–1.2)
Total Protein: 6.7 g/dL (ref 6.0–8.3)

## 2015-08-11 ENCOUNTER — Encounter: Payer: Self-pay | Admitting: *Deleted

## 2015-09-09 ENCOUNTER — Ambulatory Visit (INDEPENDENT_AMBULATORY_CARE_PROVIDER_SITE_OTHER): Payer: Medicare Other | Admitting: Nurse Practitioner

## 2015-09-09 ENCOUNTER — Encounter: Payer: Self-pay | Admitting: Nurse Practitioner

## 2015-09-09 VITALS — BP 120/60 | HR 68 | Wt 126.8 lb

## 2015-09-09 DIAGNOSIS — R300 Dysuria: Secondary | ICD-10-CM | POA: Diagnosis not present

## 2015-09-09 LAB — POCT URINALYSIS DIPSTICK
Bilirubin, UA: NEGATIVE
Glucose, UA: NEGATIVE
Ketones, UA: NEGATIVE
Nitrite, UA: NEGATIVE
Protein, UA: NEGATIVE
Spec Grav, UA: 1.01
Urobilinogen, UA: 0.2
pH, UA: 7

## 2015-09-09 MED ORDER — SULFAMETHOXAZOLE-TRIMETHOPRIM 800-160 MG PO TABS
1.0000 | ORAL_TABLET | Freq: Two times a day (BID) | ORAL | Status: DC
Start: 2015-09-09 — End: 2015-10-08

## 2015-09-09 NOTE — Progress Notes (Signed)
Patient ID: Anita Benjamin, female    DOB: [birth year]  Age: 81 y.o. MRN: [id number]  CC: Urinary Tract Infection   HPI Anita Benjamin presents for UTI symptoms x 5 days.   1) Hematuria x 3 days. This improved then worsened again  Intermittent  Urgency, frequcncy, lower abdominal pain  No treatment to date  History Anita Benjamin has a past medical history of Hypercholesterolemia; Hypertension; Anemia; Fibrocystic breast disease; and History of chicken pox.   She has past surgical history that includes Laser repair of torn retina.   Her family history includes Breast cancer in her paternal aunt; Cancer (age of onset: 90) in her father; Heart disease (age of onset: 64) in her mother. There is no history of Colon cancer.She reports that she has never smoked. She has never used smokeless tobacco. She reports that she does not drink alcohol or use illicit drugs.  Outpatient Prescriptions Prior to Visit  Medication Sig Dispense Refill  . amiodarone (PACERONE) 200 MG tablet Take 1 tablet (200 mg total) by mouth 2 (two) times daily. (Patient taking differently: Take 200 mg by mouth 2 (two) times daily as needed. ) 70 tablet 3  . Ascorbic Acid (VITAMIN C) 500 MG CAPS Take 500 mg by mouth daily.    Marland Kitchen aspirin 81 MG tablet Take 81 mg by mouth daily.    . Calcium-Magnesium-Vitamin D (CALCIUM 500 PO) Take by mouth daily.    . fish oil-omega-3 fatty acids 1000 MG capsule Take 1 g by mouth daily.     . Flaxseed, Linseed, (FLAX SEEDS PO) Take by mouth daily.     . irbesartan (AVAPRO) 150 MG tablet Take 1 tablet (150 mg total) by mouth daily. 30 tablet 5  . metoprolol succinate (TOPROL-XL) 50 MG 24 hr tablet Take 1 tablet (50 mg total) by mouth daily. Take with or immediately following a meal. (Patient taking differently: Take 50 mg by mouth daily as needed. Take with or immediately following a meal.) 30 tablet 11  . Multiple Vitamin (MULTIVITAMIN) tablet Take 1 tablet by mouth daily.    . ranitidine (ZANTAC) 150 MG  capsule Take 150 mg by mouth 2 (two) times daily.    . Selenium (SELENICAPS-200 PO) Take 200 mg by mouth daily.    . sertraline (ZOLOFT) 50 MG tablet Take 1/2 tablet q day 30 tablet 0   No facility-administered medications prior to visit.    ROS Review of Systems  Constitutional: Negative for fever, chills, diaphoresis and fatigue.  Gastrointestinal: Positive for abdominal pain.       Lower  Genitourinary: Positive for dysuria, urgency, frequency and hematuria. Negative for flank pain, decreased urine volume, difficulty urinating and pelvic pain.  Skin: Negative for rash.    Objective:  BP 120/60 mmHg  Pulse 68  Wt 126 lb 12.8 oz (57.516 kg)  SpO2 97%  LMP 10/27/1989  Physical Exam  Constitutional: She is oriented to person, place, and time. She appears well-developed and well-nourished. No distress.  HENT:  Head: Normocephalic and atraumatic.  Right Ear: External ear normal.  Left Ear: External ear normal.  Abdominal: There is no CVA tenderness.  Neurological: She is alert and oriented to person, place, and time.  Skin: Skin is warm and dry. No rash noted. She is not diaphoretic.  Psychiatric: She has a normal mood and affect. Her behavior is normal. Judgment and thought content normal.   Assessment & Plan:   Anita Benjamin was seen today for urinary tract infection.  Diagnoses and all  orders for this visit:  Dysuria -     POCT Urinalysis Dipstick -     Urine culture  Other orders -     sulfamethoxazole-trimethoprim (BACTRIM DS,SEPTRA DS) 800-160 MG tablet; Take 1 tablet by mouth 2 (two) times daily.   I am having Anita Benjamin start on sulfamethoxazole-trimethoprim. I am also having her maintain her Calcium-Magnesium-Vitamin D (CALCIUM 500 PO), fish oil-omega-3 fatty acids, (Flaxseed, Linseed, (FLAX SEEDS PO)), multivitamin, Vitamin C, Selenium (SELENICAPS-200 PO), metoprolol succinate, amiodarone, aspirin, ranitidine, irbesartan, and sertraline.  Meds ordered this encounter   Medications  . sulfamethoxazole-trimethoprim (BACTRIM DS,SEPTRA DS) 800-160 MG tablet    Sig: Take 1 tablet by mouth 2 (two) times daily.    Dispense:  10 tablet    Refill:  0    Order Specific Question:  Supervising Provider    Answer:  Crecencio Mc [[id number]]     Follow-up: Return if symptoms worsen or fail to improve.

## 2015-09-09 NOTE — Patient Instructions (Signed)
The antibiotic will be bactrim DS because Cirpo interacts with your heart medication  Please take a probiotic ( Align, Floraque or Culturelle) while you are on the antibiotic to prevent a serious antibiotic associated diarrhea  Called clostirudium dificile colitis and a vaginal yeast infection.

## 2015-09-10 ENCOUNTER — Other Ambulatory Visit: Payer: Self-pay

## 2015-09-10 ENCOUNTER — Telehealth: Payer: Self-pay

## 2015-09-10 ENCOUNTER — Telehealth: Payer: Self-pay | Admitting: Internal Medicine

## 2015-09-10 NOTE — Telephone Encounter (Signed)
See previous note.  Already addressed.

## 2015-09-10 NOTE — Telephone Encounter (Signed)
[name] to pt.  She is feeling better today.  Wants to wait for culture results.  Will call if problems.

## 2015-09-10 NOTE — Telephone Encounter (Signed)
Have tried to call pt multiple times to discuss changing medication and to clarify her medications.  Left message.

## 2015-09-10 NOTE — Telephone Encounter (Signed)
SORRY THE MEDICINE IS AVAPRO

## 2015-09-10 NOTE — Telephone Encounter (Signed)
[facility] called wanting to let you know Bactrim that was called in a interaction with Bactrim. Is this ok with you?

## 2015-09-10 NOTE — Telephone Encounter (Signed)
Pt returned Dr Nicki Reaper call. Pt states she is now home. Thank you!

## 2015-09-11 NOTE — Telephone Encounter (Signed)
Thank you :)

## 2015-09-12 ENCOUNTER — Other Ambulatory Visit: Payer: Self-pay | Admitting: *Deleted

## 2015-09-12 ENCOUNTER — Other Ambulatory Visit: Payer: Self-pay | Admitting: Internal Medicine

## 2015-09-12 LAB — URINE CULTURE: Colony Count: >=100000

## 2015-09-12 MED ORDER — NITROFURANTOIN MONOHYD MACRO 100 MG PO CAPS: 100.0000 mg | ORAL_CAPSULE | Freq: Two times a day (BID) | ORAL | Status: DC

## 2015-09-12 NOTE — Progress Notes (Signed)
Orders placed for macrobid.

## 2015-09-12 NOTE — Telephone Encounter (Signed)
Opened in error

## 2015-09-17 ENCOUNTER — Other Ambulatory Visit: Payer: Self-pay | Admitting: Internal Medicine

## 2015-09-17 DIAGNOSIS — R300 Dysuria: Secondary | ICD-10-CM | POA: Insufficient documentation

## 2015-09-17 NOTE — Telephone Encounter (Signed)
Please advise 

## 2015-09-17 NOTE — Assessment & Plan Note (Signed)
POCT urine with symptoms probable for UTI. Will treat with Bactrim DS and obtain urine culture. Advised probiotics and good water intake. Will follow after culture

## 2015-09-18 ENCOUNTER — Other Ambulatory Visit: Payer: Self-pay | Admitting: Internal Medicine

## 2015-09-18 MED ORDER — SERTRALINE HCL 50 MG PO TABS: ORAL_TABLET | ORAL | Status: DC

## 2015-09-18 NOTE — Telephone Encounter (Signed)
ok'd zoloft #30 with one refill.

## 2015-09-19 ENCOUNTER — Other Ambulatory Visit: Payer: Self-pay

## 2015-09-19 MED ORDER — SERTRALINE HCL 50 MG PO TABS: 50.0000 mg | ORAL_TABLET | Freq: Every day | ORAL | Status: DC

## 2015-09-24 ENCOUNTER — Other Ambulatory Visit: Payer: Self-pay | Admitting: Internal Medicine

## 2015-09-24 NOTE — Telephone Encounter (Signed)
Please advise refill? 

## 2015-09-25 NOTE — Telephone Encounter (Signed)
This is an antibiotic.  She should not need a refill.  If problems, will need to be reevaluated.

## 2015-10-08 ENCOUNTER — Encounter: Payer: Self-pay | Admitting: Cardiovascular Disease

## 2015-10-08 ENCOUNTER — Ambulatory Visit (INDEPENDENT_AMBULATORY_CARE_PROVIDER_SITE_OTHER): Payer: Medicare Other | Admitting: Cardiovascular Disease

## 2015-10-08 VITALS — BP 120/68 | HR 56 | Ht 67.0 in | Wt 128.5 lb

## 2015-10-08 DIAGNOSIS — I483 Typical atrial flutter: Secondary | ICD-10-CM | POA: Diagnosis not present

## 2015-10-08 DIAGNOSIS — E7800 Pure hypercholesterolemia, unspecified: Secondary | ICD-10-CM | POA: Diagnosis not present

## 2015-10-08 DIAGNOSIS — R55 Syncope and collapse: Secondary | ICD-10-CM

## 2015-10-08 DIAGNOSIS — I10 Essential (primary) hypertension: Secondary | ICD-10-CM | POA: Diagnosis not present

## 2015-10-08 NOTE — Assessment & Plan Note (Signed)
We did spend some time talking about her cholesterol. It is not particularly bad, in fact probably better than average. Suggested she research CT coronary calcium score This is a study she could do as a screening test if she would like

## 2015-10-08 NOTE — Progress Notes (Signed)
Patient ID: Anita Benjamin, female    DOB: [birth year], 82 y.o.   MRN: [id number]  HPI Comments: Anita Benjamin is a very pleasant 82 year old woman, patient of Dr. Nicki Reaper, with a history of tachycardia, SVT episodes, atrial flutter, hypotension. She reports several year history, possibly even up to 10 years of tachyarrhythmia. She presents today for follow-up of her tachycardia  In follow-up today, she denies any frequent episodes of arrhythmia, possibly one episode several months ago lasting for one hour She did not have to take any medications she reports that she is taking amiodarone and metoprolol only as needed  She reports her weight has been stable Denies any shortness of breath or chest pain on exertion On discussion concerning her family history Mother died at age 3, found down, etiology unclear  EKG on today's visit shows normal sinus rhythm with rate 56 bpm, no significant ST or T-wave changes  Other past medical history On prior office visit she had atrial flutter with ventricular rate 130 bpm. She is not had any recurrent symptoms as she feels very jittery when she is in atrial flutter  In terms of her family history, father had lung cancer, possible heart issues, died at age 48. Mother had a stroke, died at 58  In terms of her social history, she is not a smoker with no smoking history, lives at home with her husband. Is retired Total cholesterol 211  Allergies  Allergen Reactions  . Oruvail [Ketoprofen]   . Relafen [Nabumetone]   . Timentin [Ticarcillin-Pot Clavulanate]     Outpatient Encounter Prescriptions as of 10/08/2015  Medication Sig  . amiodarone (PACERONE) 200 MG tablet Take 1 tablet (200 mg total) by mouth 2 (two) times daily. (Patient taking differently: Take 200 mg by mouth 2 (two) times daily as needed. )  . Ascorbic Acid (VITAMIN C) 500 MG CAPS Take 500 mg by mouth daily.  Marland Kitchen aspirin 81 MG tablet Take 81 mg by mouth daily.  . Calcium-Magnesium-Vitamin D  (CALCIUM 500 PO) Take by mouth daily.  . fish oil-omega-3 fatty acids 1000 MG capsule Take 1 g by mouth daily.   . Flaxseed, Linseed, (FLAX SEEDS PO) Take by mouth daily.   . irbesartan (AVAPRO) 150 MG tablet Take 1 tablet (150 mg total) by mouth daily.  . metoprolol succinate (TOPROL-XL) 50 MG 24 hr tablet Take 1 tablet (50 mg total) by mouth daily. Take with or immediately following a meal. (Patient taking differently: Take 50 mg by mouth daily as needed. Take with or immediately following a meal.)  . Multiple Vitamin (MULTIVITAMIN) tablet Take 1 tablet by mouth daily.  . Probiotic Product (PROBIOTIC PO) Take by mouth daily.  . ranitidine (ZANTAC) 150 MG capsule Take 150 mg by mouth 2 (two) times daily.  . Selenium (SELENICAPS-200 PO) Take 200 mg by mouth daily.  . sertraline (ZOLOFT) 50 MG tablet Take 1 tablet (50 mg total) by mouth daily. (Patient taking differently: Take 25 mg by mouth daily. )  . [DISCONTINUED] nitrofurantoin, macrocrystal-monohydrate, (MACROBID) 100 MG capsule Take 1 capsule (100 mg total) by mouth 2 (two) times daily. (Patient not taking: Reported on 10/08/2015)  . [DISCONTINUED] sulfamethoxazole-trimethoprim (BACTRIM DS,SEPTRA DS) 800-160 MG tablet Take 1 tablet by mouth 2 (two) times daily. (Patient not taking: Reported on 10/08/2015)   No facility-administered encounter medications on file as of 10/08/2015.    Past Medical History  Diagnosis Date  . Hypercholesterolemia   . Hypertension   . Anemia   . Fibrocystic  breast disease   . History of chicken pox     Past Surgical History  Procedure Laterality Date  . Laser repair of torn retina      Social History  reports that she has never smoked. She has never used smokeless tobacco. She reports that she does not drink alcohol or use illicit drugs.  Family History family history includes Breast cancer in her paternal aunt; Cancer (age of onset: 96) in her father; Heart disease (age of onset: 32) in her mother. There  is no history of Colon cancer.  Review of Systems  Constitutional: Negative.   Respiratory: Negative.   Cardiovascular: Negative.   Musculoskeletal: Negative.   Skin: Negative.   Neurological: Negative.   Hematological: Negative.   Psychiatric/Behavioral: Negative.   All other systems reviewed and are negative.   BP 120/68 mmHg  Pulse 56  Ht 5\' 7"  (1.702 m)  Wt 128 lb 8 oz (58.287 kg)  BMI 20.12 kg/m2  LMP 10/27/1989  Physical Exam  Constitutional: She is oriented to person, place, and time. She appears well-developed and well-nourished.  HENT:  Head: Normocephalic.  Nose: Nose normal.  Mouth/Throat: Oropharynx is clear and moist.  Eyes: Conjunctivae are normal. Pupils are equal, round, and reactive to light.  Neck: Normal range of motion. Neck supple. No JVD present.  Cardiovascular: Regular rhythm, S1 normal, S2 normal, normal heart sounds and intact distal pulses.  Tachycardia present.  Exam reveals no gallop and no friction rub.   No murmur heard. Pulmonary/Chest: Effort normal and breath sounds normal. No respiratory distress. She has no wheezes. She has no rales. She exhibits no tenderness.  Abdominal: Soft. Bowel sounds are normal. She exhibits no distension. There is no tenderness.  Musculoskeletal: Normal range of motion. She exhibits no edema or tenderness.  Lymphadenopathy:    She has no cervical adenopathy.  Neurological: She is alert and oriented to person, place, and time. Coordination normal.  Skin: Skin is warm and dry. No rash noted. No erythema.  Psychiatric: She has a normal mood and affect. Her behavior is normal. Judgment and thought content normal.    Assessment and Plan  Nursing note and vitals reviewed.

## 2015-10-08 NOTE — Assessment & Plan Note (Signed)
No near syncope or syncope episodes. Essentially normal echocardiogram March 2016 Having very rare episodes of arrhythmia

## 2015-10-08 NOTE — Patient Instructions (Addendum)
You are doing well. No medication changes were made.  Research CT coronary calcium score, on google image This can be used as a screening test for coronary artery disease Cost is $150.  Please call us if you have new issues that need to be addressed before your next appt.  Your physician wants you to follow-up in: 12 months.  You will receive a reminder letter in the mail two months in advance. If you don't receive a letter, please call our office to schedule the follow-up appointment.

## 2015-10-08 NOTE — Assessment & Plan Note (Signed)
Very rare episodes of arrhythmia, seems to be almost one per year Last episode lasted 1 hour, resolved without medication

## 2015-10-08 NOTE — Assessment & Plan Note (Signed)
Blood pressure is well controlled on today's visit. No changes made to the medications. 

## 2015-10-21 ENCOUNTER — Ambulatory Visit (INDEPENDENT_AMBULATORY_CARE_PROVIDER_SITE_OTHER): Payer: Medicare Other | Admitting: Internal Medicine

## 2015-10-21 ENCOUNTER — Encounter: Payer: Self-pay | Admitting: Internal Medicine

## 2015-10-21 VITALS — BP 120/70 | HR 60 | Temp 98.2°F | Resp 18 | Ht 66.0 in | Wt 129.5 lb

## 2015-10-21 DIAGNOSIS — E7800 Pure hypercholesterolemia, unspecified: Secondary | ICD-10-CM

## 2015-10-21 DIAGNOSIS — I483 Typical atrial flutter: Secondary | ICD-10-CM

## 2015-10-21 DIAGNOSIS — I10 Essential (primary) hypertension: Secondary | ICD-10-CM | POA: Diagnosis not present

## 2015-10-21 DIAGNOSIS — D72819 Decreased white blood cell count, unspecified: Secondary | ICD-10-CM

## 2015-10-21 DIAGNOSIS — Z86010 Personal history of colonic polyps: Secondary | ICD-10-CM

## 2015-10-21 DIAGNOSIS — R928 Other abnormal and inconclusive findings on diagnostic imaging of breast: Secondary | ICD-10-CM

## 2015-10-21 DIAGNOSIS — Z658 Other specified problems related to psychosocial circumstances: Secondary | ICD-10-CM

## 2015-10-21 DIAGNOSIS — Z1239 Encounter for other screening for malignant neoplasm of breast: Secondary | ICD-10-CM | POA: Diagnosis not present

## 2015-10-21 DIAGNOSIS — Z0000 Encounter for general adult medical examination without abnormal findings: Secondary | ICD-10-CM

## 2015-10-21 DIAGNOSIS — R143 Flatulence: Secondary | ICD-10-CM

## 2015-10-21 DIAGNOSIS — IMO0001 Reserved for inherently not codable concepts without codable children: Secondary | ICD-10-CM

## 2015-10-21 DIAGNOSIS — Z23 Encounter for immunization: Secondary | ICD-10-CM

## 2015-10-21 DIAGNOSIS — F439 Reaction to severe stress, unspecified: Secondary | ICD-10-CM

## 2015-10-21 NOTE — Progress Notes (Signed)
Pre-visit discussion using our clinic review tool. No additional management support is needed unless otherwise documented below in the visit note.  

## 2015-10-21 NOTE — Patient Instructions (Signed)
benefiber daily

## 2015-10-21 NOTE — Progress Notes (Signed)
Patient ID: Anita Benjamin, female   DOB: Aug 16, 1934, 82 y.o.   MRN: [id number]   Subjective:    Patient ID: Anita Benjamin, female    DOB: 07/22/1934, 82 y.o.   MRN: [id number]  HPI  Patient with past history of hypercholesterolemia, atrial flutter and hypertension.  She comes in today to follow up on these issues as well as for a complete physical exam.  She reports increased gas.  Having regular bowel movements.  No chest pain or tightness.  No sob.  No acid reflux.  No increased heart rate or palpitations.  Feels she is handling stress well.     Past Medical History  Diagnosis Date  . Hypercholesterolemia   . Hypertension   . Anemia   . Fibrocystic breast disease   . History of chicken pox    Past Surgical History  Procedure Laterality Date  . Laser repair of torn retina     Family History  Problem Relation Age of Onset  . Heart disease Mother 48    Heart attack  . Cancer Father 17    lung cancer  . Breast cancer Paternal Aunt     x3  . Colon cancer Neg Hx    Social History   Social History  . Marital Status: Widowed    Spouse Name: N/A  . Number of Children: 2  . Years of Education: N/A   Social History Main Topics  . Smoking status: Never Smoker   . Smokeless tobacco: Never Used  . Alcohol Use: No  . Drug Use: No  . Sexual Activity: Not Asked   Other Topics Concern  . None   Social History Narrative    Outpatient Encounter Prescriptions as of 10/21/2015  Medication Sig  . amiodarone (PACERONE) 200 MG tablet Take 1 tablet (200 mg total) by mouth 2 (two) times daily. (Patient taking differently: Take 200 mg by mouth 2 (two) times daily as needed. )  . Ascorbic Acid (VITAMIN C) 500 MG CAPS Take 500 mg by mouth daily.  Marland Kitchen aspirin 81 MG tablet Take 81 mg by mouth daily.  . Calcium-Magnesium-Vitamin D (CALCIUM 500 PO) Take by mouth daily.  . fish oil-omega-3 fatty acids 1000 MG capsule Take 1 g by mouth daily.   . Flaxseed, Linseed, (FLAX SEEDS PO) Take by mouth daily.    . irbesartan (AVAPRO) 150 MG tablet Take 1 tablet (150 mg total) by mouth daily.  . metoprolol succinate (TOPROL-XL) 50 MG 24 hr tablet Take 1 tablet (50 mg total) by mouth daily. Take with or immediately following a meal. (Patient taking differently: Take 50 mg by mouth daily as needed. Take with or immediately following a meal.)  . Multiple Vitamin (MULTIVITAMIN) tablet Take 1 tablet by mouth daily.  . Probiotic Product (PROBIOTIC PO) Take by mouth daily.  . ranitidine (ZANTAC) 150 MG capsule Take 150 mg by mouth 2 (two) times daily.  . Selenium (SELENICAPS-200 PO) Take 200 mg by mouth daily.  . sertraline (ZOLOFT) 50 MG tablet Take 1 tablet (50 mg total) by mouth daily. (Patient taking differently: Take 25 mg by mouth daily. )   No facility-administered encounter medications on file as of 10/21/2015.    Review of Systems  Constitutional: Negative for appetite change and unexpected weight change.  HENT: Negative for congestion and sinus pressure.   Eyes: Negative for pain and visual disturbance.  Respiratory: Negative for cough, chest tightness and shortness of breath.   Cardiovascular: Negative for chest pain, palpitations and leg  swelling.  Gastrointestinal: Negative for nausea, vomiting, abdominal pain and diarrhea.  Genitourinary: Negative for dysuria and difficulty urinating.  Musculoskeletal: Negative for back pain and joint swelling.  Skin: Negative for color change and rash.  Neurological: Negative for dizziness, light-headedness and headaches.  Hematological: Negative for adenopathy. Does not bruise/bleed easily.  Psychiatric/Behavioral: Negative for dysphoric mood and agitation.       Objective:    Physical Exam  Constitutional: She is oriented to person, place, and time. She appears well-developed and well-nourished. No distress.  HENT:  Nose: Nose normal.  Mouth/Throat: Oropharynx is clear and moist.  Eyes: Right eye exhibits no discharge. Left eye exhibits no  discharge. No scleral icterus.  Neck: Neck supple. No thyromegaly present.  Cardiovascular: Normal rate and regular rhythm.   Pulmonary/Chest: Breath sounds normal. No accessory muscle usage. No tachypnea. No respiratory distress. She has no decreased breath sounds. She has no wheezes. She has no rhonchi. Right breast exhibits no inverted nipple, no mass, no nipple discharge and no tenderness (no axillary adenopathy). Left breast exhibits no inverted nipple, no mass, no nipple discharge and no tenderness (no axilarry adenopathy).  Abdominal: Soft. Bowel sounds are normal. There is no tenderness.  Musculoskeletal: She exhibits no edema or tenderness.  Lymphadenopathy:    She has no cervical adenopathy.  Neurological: She is alert and oriented to person, place, and time.  Skin: Skin is warm. No rash noted. No erythema.  Psychiatric: She has a normal mood and affect. Her behavior is normal.    BP 120/70 mmHg  Pulse 60  Temp(Src) 98.2 F (36.8 C) (Oral)  Resp 18  Ht 5\' 6"  (1.676 m)  Wt 129 lb 8 oz (58.741 kg)  BMI 20.91 kg/m2  SpO2 97%  LMP 10/27/1989 Wt Readings from Last 3 Encounters:  10/21/15 129 lb 8 oz (58.741 kg)  10/08/15 128 lb 8 oz (58.287 kg)  09/09/15 126 lb 12.8 oz (57.516 kg)     Lab Results  Component Value Date   WBC 4.0 08/08/2015   HGB 14.0 08/08/2015   HCT 42.0 08/08/2015   PLT 275.0 08/08/2015   GLUCOSE 90 08/08/2015   CHOL 206* 08/08/2015   TRIG 89.0 08/08/2015   HDL 67.60 08/08/2015   LDLDIRECT 117.7 11/06/2013   LDLCALC 120* 08/08/2015   ALT 18 08/08/2015   AST 22 08/08/2015   NA 140 08/08/2015   K 4.5 08/08/2015   CL 103 08/08/2015   CREATININE 0.97 08/08/2015   BUN 22 08/08/2015   CO2 30 08/08/2015   TSH 2.90 11/28/2014    Dg Cervical Spine Complete  06/05/2015  CLINICAL DATA:  Chronic cervicalgia with radicular symptoms bilaterally EXAM: CERVICAL SPINE  4+ VIEWS COMPARISON:  None. FINDINGS: Frontal, lateral, open-mouth odontoid, and bilateral  oblique views were obtained. There is no fracture. There is minimal anterolisthesis of C4 on C5, felt to be due to underlying spondylosis. There is no other spondylolisthesis. Prevertebral soft tissues and predental space regions are normal. There is slight disc space narrowing at C4-5 and C6-7. There are anterior osteophytes at C4, C5, and C6. There is moderate exit foraminal narrowing at C4-5 bilaterally. There is milder exit foraminal narrowing at C5-6 bilaterally. No erosive change. IMPRESSION: Osteoarthritic change, most notably at C4-5. Slight spondylolisthesis at C4-5 is felt to be due to underlying spondylosis. No other spondylolisthesis. No fracture. Electronically Signed   By: Lowella Grip III M.D.   On: 06/05/2015 15:34       Assessment & Plan:  Problem List Items Addressed This Visit    Abnormal mammogram    Per note, mammogram 01/02/15 - no evidence of malignancy suspected.        Atrial flutter [facility])    Sees cardiology.  Has metoprolol to take prn.  Doing well.  Follow.        Relevant Orders   TSH   Gas    Increased gas.  Probiotic.  Overdue colonoscopy.  Refer to GI.        Relevant Orders   Ambulatory referral to Gastroenterology   Health care maintenance    Physical today 10/21/15.  PAP 10/27/12 negative with negative HPV.  Mammogram scheduled.  Last documented 01/02/15 - ok.  Last colonoscopy 2006.  Due f/u colonoscopy.        History of colonic polyps    History of colonic polyps.  Last colonoscopy 2006.  Recommended f/u colonoscopy in 10 years.  Overdue.  Refer to GI.        Relevant Orders   Ambulatory referral to Gastroenterology   Hypercholesterolemia    Low cholesterol diet and exercise.  LDL 120.  Hold on medication.  Follow.       Relevant Orders   Lipid panel   Hepatic function panel   Hypertension - Primary    Blood pressure under good control.  Continue same medication regimen.  Follow pressures.  Follow metabolic panel.        Relevant  Orders   Basic metabolic panel   Leukopenia    Follow cbc.       Stress    Doing better.  On zoloft.  Follow.         Other Visit Diagnoses    Breast cancer screening        Relevant Orders    MM DIGITAL SCREENING BILATERAL    Need for prophylactic vaccination against Streptococcus pneumoniae (pneumococcus)        Relevant Orders    Pneumococcal conjugate vaccine 13-valent (Completed)        Einar Pheasant, MD

## 2015-10-27 ENCOUNTER — Encounter: Payer: Self-pay | Admitting: Internal Medicine

## 2015-10-27 DIAGNOSIS — IMO0001 Reserved for inherently not codable concepts without codable children: Secondary | ICD-10-CM | POA: Insufficient documentation

## 2015-10-27 NOTE — Assessment & Plan Note (Signed)
Increased gas.  Probiotic.  Overdue colonoscopy.  Refer to GI.

## 2015-10-27 NOTE — Assessment & Plan Note (Signed)
Low cholesterol diet and exercise.  LDL 120.  Hold on medication.  Follow.

## 2015-10-27 NOTE — Assessment & Plan Note (Signed)
Follow cbc.  

## 2015-10-27 NOTE — Assessment & Plan Note (Signed)
Sees cardiology.  Has metoprolol to take prn.  Doing well.  Follow.

## 2015-10-27 NOTE — Assessment & Plan Note (Signed)
Per note, mammogram 01/02/15 - no evidence of malignancy suspected.

## 2015-10-27 NOTE — Assessment & Plan Note (Signed)
Doing better.  On zoloft.  Follow.  

## 2015-10-27 NOTE — Assessment & Plan Note (Signed)
Physical today 10/21/15.  PAP 10/27/12 negative with negative HPV.  Mammogram scheduled.  Last documented 01/02/15 - ok.  Last colonoscopy 2006.  Due f/u colonoscopy.

## 2015-10-27 NOTE — Assessment & Plan Note (Signed)
Blood pressure under good control.  Continue same medication regimen.  Follow pressures.  Follow metabolic panel.   

## 2015-10-27 NOTE — Assessment & Plan Note (Signed)
History of colonic polyps.  Last colonoscopy 2006.  Recommended f/u colonoscopy in 10 years.  Overdue.  Refer to GI.

## 2015-11-19 ENCOUNTER — Other Ambulatory Visit: Payer: Self-pay | Admitting: Internal Medicine

## 2015-12-16 ENCOUNTER — Other Ambulatory Visit: Payer: Self-pay | Admitting: Internal Medicine

## 2016-01-05 ENCOUNTER — Other Ambulatory Visit: Payer: Self-pay | Admitting: Internal Medicine

## 2016-01-05 ENCOUNTER — Ambulatory Visit
Admission: RE | Admit: 2016-01-05 | Discharge: 2016-01-05 | Disposition: A | Discharge status: Home / Self Care | Payer: Medicare Other | Source: Ambulatory Visit | Attending: Internal Medicine | Admitting: Internal Medicine

## 2016-01-05 DIAGNOSIS — Z1239 Encounter for other screening for malignant neoplasm of breast: Secondary | ICD-10-CM

## 2016-01-05 DIAGNOSIS — Z1231 Encounter for screening mammogram for malignant neoplasm of breast: Secondary | ICD-10-CM | POA: Insufficient documentation

## 2016-01-13 ENCOUNTER — Other Ambulatory Visit (INDEPENDENT_AMBULATORY_CARE_PROVIDER_SITE_OTHER): Payer: Medicare Other

## 2016-01-13 DIAGNOSIS — I483 Typical atrial flutter: Secondary | ICD-10-CM | POA: Diagnosis not present

## 2016-01-13 DIAGNOSIS — E7800 Pure hypercholesterolemia, unspecified: Secondary | ICD-10-CM

## 2016-01-13 DIAGNOSIS — I10 Essential (primary) hypertension: Secondary | ICD-10-CM

## 2016-01-13 LAB — BASIC METABOLIC PANEL
BUN: 17 mg/dL (ref 6–23)
CO2: 29 mEq/L (ref 19–32)
Calcium: 9.6 mg/dL (ref 8.4–10.5)
Chloride: 105 mEq/L (ref 96–112)
Creatinine, Ser: 0.89 mg/dL (ref 0.40–1.20)
GFR: 64.89 mL/min (ref >60.00)
Glucose, Bld: 88 mg/dL (ref 70–99)
Potassium: 4.6 mEq/L (ref 3.5–5.1)
Sodium: 144 mEq/L (ref 135–145)

## 2016-01-13 LAB — TSH: TSH: 2.33 u[IU]/mL (ref 0.35–4.50)

## 2016-01-13 LAB — LIPID PANEL
Cholesterol: 192 mg/dL (ref 0–200)
HDL: 71.8 mg/dL (ref >39.00)
LDL Cholesterol: 102 mg/dL — ABNORMAL HIGH (ref 0–99)
NonHDL: 120.18
Total CHOL/HDL Ratio: 3
Triglycerides: 93 mg/dL (ref 0.0–149.0)
VLDL: 18.6 mg/dL (ref 0.0–40.0)

## 2016-01-13 LAB — HEPATIC FUNCTION PANEL
ALT: 15 U/L (ref 0–35)
AST: 19 U/L (ref 0–37)
Albumin: 4.2 g/dL (ref 3.5–5.2)
Alkaline Phosphatase: 63 U/L (ref 39–117)
Bilirubin, Direct: 0.1 mg/dL (ref 0.0–0.3)
Total Bilirubin: 0.6 mg/dL (ref 0.2–1.2)
Total Protein: 6.5 g/dL (ref 6.0–8.3)

## 2016-01-16 ENCOUNTER — Other Ambulatory Visit: Payer: Medicare Other

## 2016-01-20 ENCOUNTER — Ambulatory Visit (INDEPENDENT_AMBULATORY_CARE_PROVIDER_SITE_OTHER): Payer: Medicare Other | Admitting: Internal Medicine

## 2016-01-20 ENCOUNTER — Encounter: Payer: Self-pay | Admitting: Internal Medicine

## 2016-01-20 VITALS — BP 140/60 | HR 53 | Temp 98.0°F | Resp 17 | Ht 66.0 in | Wt 128.5 lb

## 2016-01-20 DIAGNOSIS — I10 Essential (primary) hypertension: Secondary | ICD-10-CM | POA: Diagnosis not present

## 2016-01-20 DIAGNOSIS — E7800 Pure hypercholesterolemia, unspecified: Secondary | ICD-10-CM | POA: Diagnosis not present

## 2016-01-20 DIAGNOSIS — F439 Reaction to severe stress, unspecified: Secondary | ICD-10-CM

## 2016-01-20 DIAGNOSIS — Z860100 Personal history of colon polyps, unspecified: Secondary | ICD-10-CM

## 2016-01-20 DIAGNOSIS — I483 Typical atrial flutter: Secondary | ICD-10-CM | POA: Diagnosis not present

## 2016-01-20 DIAGNOSIS — Z0000 Encounter for general adult medical examination without abnormal findings: Secondary | ICD-10-CM

## 2016-01-20 DIAGNOSIS — M542 Cervicalgia: Secondary | ICD-10-CM | POA: Diagnosis not present

## 2016-01-20 MED ORDER — TIZANIDINE HCL 2 MG PO CAPS: 2.0000 mg | ORAL_CAPSULE | Freq: Every evening | ORAL | Status: DC | PRN

## 2016-01-20 NOTE — Progress Notes (Signed)
Pre-visit discussion using our clinic review tool. No additional management support is needed unless otherwise documented below in the visit note.  

## 2016-01-20 NOTE — Progress Notes (Addendum)
Patient ID: Anita Benjamin, female   DOB: 1934/01/16, 82 y.o.   MRN: [id number]   Subjective:    Patient ID: Anita Benjamin, female    DOB: August 22, 1934, 82 y.o.   MRN: [id number]  HPI  Patient here for her physical exam.  She reports some increased neck pain.  Aggravated with carrying in groceries. When looks left and right - worsens the pain/pulling sensation.  Discussed xray.  Discussed mri.  She declines. No chest pain.  No sob.  Eating well.  No nausea or vomiting.  No abdominal pain or cramping.  Bowels stable.  Overall feels she is doing well.     Past Medical History  Diagnosis Date  . Hypercholesterolemia   . Hypertension   . Anemia   . Fibrocystic breast disease   . History of chicken pox    Past Surgical History  Procedure Laterality Date  . Laser repair of torn retina    . Breast biopsy Right 2014    NEG   Family History  Problem Relation Age of Onset  . Heart disease Mother 45    Heart attack  . Cancer Father 41    lung cancer  . Breast cancer Paternal Aunt     x3  . Colon cancer Neg Hx    Social History   Social History  . Marital Status: Widowed    Spouse Name: N/A  . Number of Children: 2  . Years of Education: N/A   Social History Main Topics  . Smoking status: Never Smoker   . Smokeless tobacco: Never Used  . Alcohol Use: No  . Drug Use: No  . Sexual Activity: Not Asked   Other Topics Concern  . None   Social History Narrative    Outpatient Encounter Prescriptions as of 01/20/2016  Medication Sig  . amiodarone (PACERONE) 200 MG tablet Take 1 tablet (200 mg total) by mouth 2 (two) times daily. (Patient taking differently: Take 200 mg by mouth 2 (two) times daily as needed. )  . Ascorbic Acid (VITAMIN C) 500 MG CAPS Take 500 mg by mouth daily.  Marland Kitchen aspirin 81 MG tablet Take 81 mg by mouth daily.  . Calcium-Magnesium-Vitamin D (CALCIUM 500 PO) Take by mouth daily.  . fish oil-omega-3 fatty acids 1000 MG capsule Take 1 g by mouth daily.   . Flaxseed,  Linseed, (FLAX SEEDS PO) Take by mouth daily.   . irbesartan (AVAPRO) 150 MG tablet TAKE 1 TABLET BY MOUTH ONCE DAILY.  . metoprolol succinate (TOPROL-XL) 50 MG 24 hr tablet Take 1 tablet (50 mg total) by mouth daily. Take with or immediately following a meal. (Patient taking differently: Take 50 mg by mouth daily as needed. Take with or immediately following a meal.)  . Multiple Vitamin (MULTIVITAMIN) tablet Take 1 tablet by mouth daily.  . Probiotic Product (PROBIOTIC PO) Take by mouth daily.  . ranitidine (ZANTAC) 150 MG capsule Take 150 mg by mouth 2 (two) times daily.  . Selenium (SELENICAPS-200 PO) Take 200 mg by mouth daily.  . sertraline (ZOLOFT) 50 MG tablet TAKE 1 TABLET BY MOUTH ONCE DAILY.  . tizanidine (ZANAFLEX) 2 MG capsule Take 1 capsule (2 mg total) by mouth at bedtime as needed for muscle spasms.   No facility-administered encounter medications on file as of 01/20/2016.    Review of Systems  Constitutional: Negative for appetite change and unexpected weight change.  HENT: Negative for congestion and sinus pressure.   Eyes: Negative for pain and visual disturbance.  Respiratory: Negative for cough, chest tightness and shortness of breath.   Cardiovascular: Negative for chest pain, palpitations and leg swelling.  Gastrointestinal: Negative for nausea, vomiting, abdominal pain and diarrhea.  Genitourinary: Negative for dysuria and difficulty urinating.  Musculoskeletal: Negative for joint swelling and neck pain.  Skin: Negative for color change and rash.  Neurological: Negative for dizziness, light-headedness and headaches.  Hematological: Negative for adenopathy. Does not bruise/bleed easily.  Psychiatric/Behavioral: Negative for dysphoric mood and agitation.       Objective:    Physical Exam  Constitutional: She is oriented to person, place, and time. She appears well-developed and well-nourished. No distress.  HENT:  Nose: Nose normal.  Mouth/Throat: Oropharynx is  clear and moist.  Eyes: Right eye exhibits no discharge. Left eye exhibits no discharge. No scleral icterus.  Neck: Neck supple. No thyromegaly present.  Cardiovascular: Normal rate and regular rhythm.   Pulmonary/Chest: Breath sounds normal. No accessory muscle usage. No tachypnea. No respiratory distress. She has no decreased breath sounds. She has no wheezes. She has no rhonchi. Right breast exhibits no inverted nipple, no mass, no nipple discharge and no tenderness (no axillary adenopathy). Left breast exhibits no inverted nipple, no mass, no nipple discharge and no tenderness (no axilarry adenopathy).  Abdominal: Soft. Bowel sounds are normal. There is no tenderness.  Musculoskeletal: She exhibits no edema or tenderness.  Increased discomfort with looking right to left - neck.    Lymphadenopathy:    She has no cervical adenopathy.  Neurological: She is alert and oriented to person, place, and time.  Skin: Skin is warm. No rash noted. No erythema.  Psychiatric: She has a normal mood and affect. Her behavior is normal.    BP 140/60 mmHg  Pulse 53  Temp(Src) 98 F (36.7 C) (Oral)  Resp 17  Ht 5\' 6"  (1.676 m)  Wt 128 lb 8 oz (58.287 kg)  BMI 20.75 kg/m2  SpO2 95%  LMP 10/27/1989 Wt Readings from Last 3 Encounters:  01/20/16 128 lb 8 oz (58.287 kg)  10/21/15 129 lb 8 oz (58.741 kg)  10/08/15 128 lb 8 oz (58.287 kg)     Lab Results  Component Value Date   WBC 4.0 08/08/2015   HGB 14.0 08/08/2015   HCT 42.0 08/08/2015   PLT 275.0 08/08/2015   GLUCOSE 88 01/13/2016   CHOL 192 01/13/2016   TRIG 93.0 01/13/2016   HDL 71.80 01/13/2016   LDLDIRECT 117.7 11/06/2013   LDLCALC 102* 01/13/2016   ALT 15 01/13/2016   AST 19 01/13/2016   NA 144 01/13/2016   K 4.6 01/13/2016   CL 105 01/13/2016   CREATININE 0.89 01/13/2016   BUN 17 01/13/2016   CO2 29 01/13/2016   TSH 2.33 01/13/2016    Mm Screening Breast Tomo Bilateral  01/05/2016  CLINICAL DATA:  Screening. EXAM: 2D DIGITAL  SCREENING BILATERAL MAMMOGRAM WITH CAD AND ADJUNCT TOMO COMPARISON:  Previous exam(s). ACR Breast Density Category b: There are scattered areas of fibroglandular density. FINDINGS: There are no findings suspicious for malignancy. Images were processed with CAD. IMPRESSION: No mammographic evidence of malignancy. A result letter of this screening mammogram will be mailed directly to the patient. RECOMMENDATION: Screening mammogram in one year. (Code:SM-B-01Y) BI-RADS CATEGORY  1: Negative. Electronically Signed   By: Lillia Mountain M.D.   On: 01/05/2016 12:39       Assessment & Plan:   Problem List Items Addressed This Visit    Atrial flutter [facility])    Sees cardiology.  No recent problems.  Follow.       Health care maintenance    Physical today.  PAP 10/27/12 negative with negative HPv.  Mammogram 01/05/16 - Birads I.  2006 colonoscopy.  See above.       History of colonic polyps    Last colonoscopy 2006.  Was referred to GI.  Needs colonoscopy.       Hypercholesterolemia    Low cholesterol diet and exercise.  Follow lipid panel.       Relevant Orders   Hepatic function panel   Lipid panel   Hypertension    Blood pressure as outlined.  Same medication regimen.  Follow pressures. Follow metabolic panel.       Relevant Orders   Basic metabolic panel   Neck pain    Had c-spine xray.  See report.  Exam and symptoms as outlined.  Discussed further w/up.  She declines.  Follow.  zanaflex as directed.  Follow.       Stress    On zoloft.  Doing better.  Follow.         Other Visit Diagnoses    Routine general medical examination at a health care facility    -  Primary       Einar Pheasant, MD

## 2016-01-26 ENCOUNTER — Encounter: Payer: Self-pay | Admitting: Internal Medicine

## 2016-01-26 NOTE — Assessment & Plan Note (Signed)
Last colonoscopy 2006.  Was referred to GI.  Needs colonoscopy.

## 2016-01-26 NOTE — Assessment & Plan Note (Signed)
On zoloft.  Doing better.  Follow.   

## 2016-01-26 NOTE — Assessment & Plan Note (Signed)
Low cholesterol diet and exercise.  Follow lipid panel.   

## 2016-01-26 NOTE — Assessment & Plan Note (Signed)
Physical today.  PAP 10/27/12 negative with negative HPv.  Mammogram 01/05/16 - Birads I.  2006 colonoscopy.  See above.

## 2016-01-26 NOTE — Assessment & Plan Note (Signed)
Had c-spine xray.  See report.  Exam and symptoms as outlined.  Discussed further w/up.  She declines.  Follow.  zanaflex as directed.  Follow.

## 2016-01-26 NOTE — Assessment & Plan Note (Signed)
Blood pressure as outlined.  Same medication regimen.  Follow pressures.  Follow metabolic panel.  

## 2016-01-26 NOTE — Assessment & Plan Note (Signed)
Sees cardiology.  No recent problems.  Follow.

## 2016-03-15 ENCOUNTER — Telehealth: Payer: Self-pay | Admitting: Internal Medicine

## 2016-03-15 NOTE — Telephone Encounter (Signed)
Pt lvm stating that she received a bill for $265 from [place]. She spoke to her insurance and they told her that she received two physicals this year so they will not pay. The first one was coded on 10-24-22 and the second one was on 4/18. She states that she spoke to someone in our office on 5/16 and was told that they would look into it to get it fixed but she received the bill again and it was not adjusted.

## 2016-04-14 NOTE — Addendum Note (Signed)
Addended by: Alisa Graff on: 04/14/2016 06:59 PM   Modules accepted: Miquel Dunn

## 2016-04-14 NOTE — Telephone Encounter (Signed)
The patient was seen for a follow up on 4.18.17. She had a physical on 1.17.17. Can you make the necessary changes to her record it is reading as a CPE.

## 2016-04-14 NOTE — Telephone Encounter (Signed)
I am not sure if you can help with this, but this pt is calling because she was charged for two physicals.  It appears she was scheduled for two.  I would like to change the charge on the 01/2016 to a f/u visit.  Can you help with this?

## 2016-05-15 ENCOUNTER — Other Ambulatory Visit: Payer: Self-pay | Admitting: Internal Medicine

## 2016-05-24 ENCOUNTER — Other Ambulatory Visit (INDEPENDENT_AMBULATORY_CARE_PROVIDER_SITE_OTHER): Payer: Medicare Other

## 2016-05-24 DIAGNOSIS — I10 Essential (primary) hypertension: Secondary | ICD-10-CM | POA: Diagnosis not present

## 2016-05-24 DIAGNOSIS — E7800 Pure hypercholesterolemia, unspecified: Secondary | ICD-10-CM

## 2016-05-24 LAB — LIPID PANEL
Cholesterol: 206 mg/dL — ABNORMAL HIGH (ref 0–200)
HDL: 73 mg/dL (ref >39.00)
LDL Cholesterol: 112 mg/dL — ABNORMAL HIGH (ref 0–99)
NonHDL: 133.28
Total CHOL/HDL Ratio: 3
Triglycerides: 105 mg/dL (ref 0.0–149.0)
VLDL: 21 mg/dL (ref 0.0–40.0)

## 2016-05-24 LAB — BASIC METABOLIC PANEL
BUN: 16 mg/dL (ref 6–23)
CO2: 29 mEq/L (ref 19–32)
Calcium: 9.1 mg/dL (ref 8.4–10.5)
Chloride: 104 mEq/L (ref 96–112)
Creatinine, Ser: 0.92 mg/dL (ref 0.40–1.20)
GFR: 62.39 mL/min (ref >60.00)
Glucose, Bld: 93 mg/dL (ref 70–99)
Potassium: 4.3 mEq/L (ref 3.5–5.1)
Sodium: 141 mEq/L (ref 135–145)

## 2016-05-24 LAB — HEPATIC FUNCTION PANEL
ALT: 15 U/L (ref 0–35)
AST: 20 U/L (ref 0–37)
Albumin: 4.2 g/dL (ref 3.5–5.2)
Alkaline Phosphatase: 60 U/L (ref 39–117)
Bilirubin, Direct: 0.1 mg/dL (ref 0.0–0.3)
Total Bilirubin: 0.6 mg/dL (ref 0.2–1.2)
Total Protein: 6.7 g/dL (ref 6.0–8.3)

## 2016-05-27 ENCOUNTER — Ambulatory Visit (INDEPENDENT_AMBULATORY_CARE_PROVIDER_SITE_OTHER): Payer: Medicare Other | Admitting: Internal Medicine

## 2016-05-27 ENCOUNTER — Encounter: Payer: Self-pay | Admitting: Internal Medicine

## 2016-05-27 VITALS — BP 110/70 | HR 68 | Temp 98.2°F | Resp 18 | Ht 66.0 in | Wt 130.4 lb

## 2016-05-27 DIAGNOSIS — Z658 Other specified problems related to psychosocial circumstances: Secondary | ICD-10-CM | POA: Diagnosis not present

## 2016-05-27 DIAGNOSIS — I10 Essential (primary) hypertension: Secondary | ICD-10-CM | POA: Diagnosis not present

## 2016-05-27 DIAGNOSIS — N898 Other specified noninflammatory disorders of vagina: Secondary | ICD-10-CM | POA: Diagnosis not present

## 2016-05-27 DIAGNOSIS — F439 Reaction to severe stress, unspecified: Secondary | ICD-10-CM

## 2016-05-27 DIAGNOSIS — E7800 Pure hypercholesterolemia, unspecified: Secondary | ICD-10-CM

## 2016-05-27 DIAGNOSIS — I483 Typical atrial flutter: Secondary | ICD-10-CM

## 2016-05-27 DIAGNOSIS — Z23 Encounter for immunization: Secondary | ICD-10-CM | POA: Diagnosis not present

## 2016-05-27 DIAGNOSIS — Z86010 Personal history of colonic polyps: Secondary | ICD-10-CM

## 2016-05-27 MED ORDER — NYSTATIN 100000 UNIT/GM EX CREA: 1.0000 "application " | TOPICAL_CREAM | Freq: Two times a day (BID) | CUTANEOUS | 0 refills | Status: DC

## 2016-05-27 NOTE — Assessment & Plan Note (Signed)
LDL improved some from previous check.  Follow lipid panel.  Hold on medication.

## 2016-05-27 NOTE — Assessment & Plan Note (Signed)
Followed by cardiology.  Stable.  Occasional "flip".  Follow.

## 2016-05-27 NOTE — Assessment & Plan Note (Signed)
Doing well.  Feels better.  Follow.  

## 2016-05-27 NOTE — Progress Notes (Signed)
Patient ID: Anita Benjamin, female   DOB: 06/30/1934, 82 y.o.   MRN: [id number]   Subjective:    Patient ID: Anita Benjamin, female    DOB: [birth year], 82 y.o.   MRN: [id number]  HPI  Patient here for a scheduled follow up.  States she feels better than she has in a long time.  Occasionally will notice a little "flip" in her heart, but overall feels better.  This does not happen often.  No chest pain.  Breathing stable.  No nausea or vomiting.  Bowels stable. She does report some peri rectal and peri vaginal burning and irritation.  No intravaginal symptoms.     Past Medical History:  Diagnosis Date  . Anemia   . Fibrocystic breast disease   . History of chicken pox   . Hypercholesterolemia   . Hypertension    Past Surgical History:  Procedure Laterality Date  . BREAST BIOPSY Right 2014   NEG  . Laser repair of torn retina     Family History  Problem Relation Age of Onset  . Heart disease Mother 59    Heart attack  . Cancer Father 67    lung cancer  . Breast cancer Paternal Aunt     x3  . Colon cancer Neg Hx    Social History   Social History  . Marital status: Widowed    Spouse name: N/A  . Number of children: 2  . Years of education: N/A   Social History Main Topics  . Smoking status: Never Smoker  . Smokeless tobacco: Never Used  . Alcohol use No  . Drug use: No  . Sexual activity: Not Asked   Other Topics Concern  . None   Social History Narrative  . None    Outpatient Encounter Prescriptions as of 05/27/2016  Medication Sig  . amiodarone (PACERONE) 200 MG tablet Take 1 tablet (200 mg total) by mouth 2 (two) times daily. (Patient taking differently: Take 200 mg by mouth 2 (two) times daily as needed. )  . Ascorbic Acid (VITAMIN C) 500 MG CAPS Take 500 mg by mouth daily.  Marland Kitchen aspirin 81 MG tablet Take 81 mg by mouth daily.  . Calcium-Magnesium-Vitamin D (CALCIUM 500 PO) Take by mouth daily.  . fish oil-omega-3 fatty acids 1000 MG capsule Take 1 g by mouth daily.     . Flaxseed, Linseed, (FLAX SEEDS PO) Take by mouth daily.   . irbesartan (AVAPRO) 150 MG tablet TAKE 1 TABLET BY MOUTH ONCE DAILY.  . metoprolol succinate (TOPROL-XL) 50 MG 24 hr tablet Take 1 tablet (50 mg total) by mouth daily. Take with or immediately following a meal. (Patient taking differently: Take 50 mg by mouth daily as needed. Take with or immediately following a meal.)  . Multiple Vitamin (MULTIVITAMIN) tablet Take 1 tablet by mouth daily.  . Probiotic Product (PROBIOTIC PO) Take by mouth daily.  . ranitidine (ZANTAC) 150 MG capsule Take 150 mg by mouth 2 (two) times daily.  . Selenium (SELENICAPS-200 PO) Take 200 mg by mouth daily.  . sertraline (ZOLOFT) 50 MG tablet TAKE 1 TABLET BY MOUTH ONCE DAILY.  . tizanidine (ZANAFLEX) 2 MG capsule Take 1 capsule (2 mg total) by mouth at bedtime as needed for muscle spasms.  Marland Kitchen nystatin cream (MYCOSTATIN) Apply 1 application topically 2 (two) times daily.   No facility-administered encounter medications on file as of 05/27/2016.     Review of Systems  Constitutional: Negative for appetite change and unexpected weight change.  HENT: Negative for congestion and sinus pressure.   Respiratory: Negative for cough, chest tightness and shortness of breath.   Cardiovascular: Negative for chest pain and leg swelling.       Occasional "flip" of her heart.   Gastrointestinal: Negative for abdominal pain, diarrhea, nausea and vomiting.  Genitourinary: Negative for difficulty urinating.       Peri vaginal and peri rectal irritation and burning.  States is better today.  Declines exam.   Musculoskeletal: Negative for back pain and joint swelling.  Skin: Negative for color change and rash.  Neurological: Negative for dizziness, light-headedness and headaches.  Psychiatric/Behavioral: Negative for agitation and dysphoric mood.       Objective:    Physical Exam  Constitutional: She appears well-developed and well-nourished. No distress.  HENT:   Nose: Nose normal.  Mouth/Throat: Oropharynx is clear and moist.  Neck: Neck supple. No thyromegaly present.  Cardiovascular: Normal rate and regular rhythm.   Pulmonary/Chest: Breath sounds normal. No respiratory distress. She has no wheezes.  Abdominal: Soft. Bowel sounds are normal. There is no tenderness.  Musculoskeletal: She exhibits no edema or tenderness.  Lymphadenopathy:    She has no cervical adenopathy.  Skin: No rash noted. No erythema.  Psychiatric: She has a normal mood and affect. Her behavior is normal.    BP 110/70   Pulse 68   Temp 98.2 F (36.8 C) (Oral)   Resp 18   Ht 5\' 6"  (1.676 m)   Wt 130 lb 6 oz (59.1 kg)   LMP 10/27/1989   SpO2 98%   BMI 21.04 kg/m  Wt Readings from Last 3 Encounters:  05/27/16 130 lb 6 oz (59.1 kg)  01/20/16 128 lb 8 oz (58.3 kg)  10/21/15 129 lb 8 oz (58.7 kg)     Lab Results  Component Value Date   WBC 4.0 08/08/2015   HGB 14.0 08/08/2015   HCT 42.0 08/08/2015   PLT 275.0 08/08/2015   GLUCOSE 93 05/24/2016   CHOL 206 (H) 05/24/2016   TRIG 105.0 05/24/2016   HDL 73.00 05/24/2016   LDLDIRECT 117.7 11/06/2013   LDLCALC 112 (H) 05/24/2016   ALT 15 05/24/2016   AST 20 05/24/2016   NA 141 05/24/2016   K 4.3 05/24/2016   CL 104 05/24/2016   CREATININE 0.92 05/24/2016   BUN 16 05/24/2016   CO2 29 05/24/2016   TSH 2.33 01/13/2016    Mm Screening Breast Tomo Bilateral  Result Date: 01/05/2016 CLINICAL DATA:  Screening. EXAM: 2D DIGITAL SCREENING BILATERAL MAMMOGRAM WITH CAD AND ADJUNCT TOMO COMPARISON:  Previous exam(s). ACR Breast Density Category b: There are scattered areas of fibroglandular density. FINDINGS: There are no findings suspicious for malignancy. Images were processed with CAD. IMPRESSION: No mammographic evidence of malignancy. A result letter of this screening mammogram will be mailed directly to the patient. RECOMMENDATION: Screening mammogram in one year. (Code:SM-B-01Y) BI-RADS CATEGORY  1: Negative.  Electronically Signed   By: Lillia Mountain M.D.   On: 01/05/2016 12:39       Assessment & Plan:   Problem List Items Addressed This Visit    Atrial flutter ([place])    Followed by cardiology.  Stable.  Occasional "flip".  Follow.        History of colonic polyps    Last colonoscopy 2006.  Saw GI.  See Dr Marton Redwood note.  Had cologuard.  Negative.  Follow.       Hypercholesterolemia    LDL improved some from previous check.  Follow lipid panel.  Hold on medication.        Hypertension    Her blood pressures have been averaging [id number] systolic readings.  Same medication regimen.  Follow.       Stress    Doing well.  Feels better.  Follow.        Other Visit Diagnoses    Vaginal irritation    -  Primary   peri vaginal and peri rectal irritation.  she declined exam.  better today.  nystatin cream if needed.  notify me if persistent.    Encounter for immunization       Relevant Orders   Flu Vaccine QUAD 36+ mos IM (Completed)       Einar Pheasant, MD

## 2016-05-27 NOTE — Assessment & Plan Note (Signed)
Last colonoscopy 2006.  Saw GI.  See Dr Marton Redwood note.  Had cologuard.  Negative.  Follow.

## 2016-05-27 NOTE — Assessment & Plan Note (Signed)
Her blood pressures have been averaging A999333 systolic readings.  Same medication regimen.  Follow.

## 2016-05-27 NOTE — Progress Notes (Signed)
Pre-visit discussion using our clinic review tool. No additional management support is needed unless otherwise documented below in the visit note.  

## 2016-08-13 ENCOUNTER — Other Ambulatory Visit: Payer: Self-pay | Admitting: Internal Medicine

## 2016-09-23 ENCOUNTER — Telehealth: Payer: Self-pay

## 2016-09-23 DIAGNOSIS — I10 Essential (primary) hypertension: Secondary | ICD-10-CM

## 2016-09-23 DIAGNOSIS — E7800 Pure hypercholesterolemia, unspecified: Secondary | ICD-10-CM

## 2016-09-23 DIAGNOSIS — D72819 Decreased white blood cell count, unspecified: Secondary | ICD-10-CM

## 2016-09-23 NOTE — Telephone Encounter (Signed)
Order placed for labs.

## 2016-09-23 NOTE — Telephone Encounter (Signed)
Pt coming for fasting labs 09/24/16. Please place future orders. Thank you.

## 2016-09-24 ENCOUNTER — Other Ambulatory Visit (INDEPENDENT_AMBULATORY_CARE_PROVIDER_SITE_OTHER): Payer: Medicare Other

## 2016-09-24 DIAGNOSIS — D72819 Decreased white blood cell count, unspecified: Secondary | ICD-10-CM | POA: Diagnosis not present

## 2016-09-24 DIAGNOSIS — E7800 Pure hypercholesterolemia, unspecified: Secondary | ICD-10-CM

## 2016-09-24 DIAGNOSIS — I10 Essential (primary) hypertension: Secondary | ICD-10-CM | POA: Diagnosis not present

## 2016-09-24 LAB — CBC WITH DIFFERENTIAL/PLATELET
Basophils Absolute: 0 10*3/uL (ref 0.0–0.1)
Basophils Relative: 0.9 % (ref 0.0–3.0)
Eosinophils Absolute: 0.2 10*3/uL (ref 0.0–0.7)
Eosinophils Relative: 5.2 % — ABNORMAL HIGH (ref 0.0–5.0)
HCT: 37.9 % (ref 36.0–46.0)
Hemoglobin: 13 g/dL (ref 12.0–15.0)
Lymphocytes Relative: 25.9 % (ref 12.0–46.0)
Lymphs Abs: 0.9 10*3/uL (ref 0.7–4.0)
MCHC: 34.4 g/dL (ref 30.0–36.0)
MCV: 92.3 fl (ref 78.0–100.0)
Monocytes Absolute: 0.3 10*3/uL (ref 0.1–1.0)
Monocytes Relative: 9.2 % (ref 3.0–12.0)
Neutro Abs: 2.1 10*3/uL (ref 1.4–7.7)
Neutrophils Relative %: 58.8 % (ref 43.0–77.0)
Platelets: 246 10*3/uL (ref 150.0–400.0)
RBC: 4.11 Mil/uL (ref 3.87–5.11)
RDW: 13.5 % (ref 11.5–15.5)
WBC: 3.6 10*3/uL — ABNORMAL LOW (ref 4.0–10.5)

## 2016-09-24 LAB — COMPREHENSIVE METABOLIC PANEL
ALT: 15 U/L (ref 0–35)
AST: 17 U/L (ref 0–37)
Albumin: 3.9 g/dL (ref 3.5–5.2)
Alkaline Phosphatase: 58 U/L (ref 39–117)
BUN: 18 mg/dL (ref 6–23)
CO2: 32 mEq/L (ref 19–32)
Calcium: 9.4 mg/dL (ref 8.4–10.5)
Chloride: 107 mEq/L (ref 96–112)
Creatinine, Ser: 0.87 mg/dL (ref 0.40–1.20)
GFR: 66.49 mL/min (ref >60.00)
Glucose, Bld: 97 mg/dL (ref 70–99)
Potassium: 4.9 mEq/L (ref 3.5–5.1)
Sodium: 142 mEq/L (ref 135–145)
Total Bilirubin: 0.5 mg/dL (ref 0.2–1.2)
Total Protein: 6.5 g/dL (ref 6.0–8.3)

## 2016-09-24 LAB — LIPID PANEL
Cholesterol: 173 mg/dL (ref 0–200)
HDL: 61.4 mg/dL (ref >39.00)
LDL Cholesterol: 94 mg/dL (ref 0–99)
NonHDL: 111.36
Total CHOL/HDL Ratio: 3
Triglycerides: 88 mg/dL (ref 0.0–149.0)
VLDL: 17.6 mg/dL (ref 0.0–40.0)

## 2016-10-01 ENCOUNTER — Ambulatory Visit (INDEPENDENT_AMBULATORY_CARE_PROVIDER_SITE_OTHER): Payer: Medicare Other | Admitting: Internal Medicine

## 2016-10-01 ENCOUNTER — Encounter: Payer: Self-pay | Admitting: Internal Medicine

## 2016-10-01 ENCOUNTER — Ambulatory Visit (INDEPENDENT_AMBULATORY_CARE_PROVIDER_SITE_OTHER): Payer: Medicare Other

## 2016-10-01 ENCOUNTER — Other Ambulatory Visit: Payer: Self-pay | Admitting: Cardiovascular Disease

## 2016-10-01 VITALS — BP 116/74 | HR 117 | Temp 97.8°F | Ht 66.0 in | Wt 132.0 lb

## 2016-10-01 DIAGNOSIS — R0602 Shortness of breath: Secondary | ICD-10-CM | POA: Diagnosis not present

## 2016-10-01 DIAGNOSIS — R05 Cough: Secondary | ICD-10-CM

## 2016-10-01 DIAGNOSIS — F439 Reaction to severe stress, unspecified: Secondary | ICD-10-CM

## 2016-10-01 DIAGNOSIS — D72819 Decreased white blood cell count, unspecified: Secondary | ICD-10-CM

## 2016-10-01 DIAGNOSIS — R000 Tachycardia, unspecified: Secondary | ICD-10-CM

## 2016-10-01 DIAGNOSIS — I483 Typical atrial flutter: Secondary | ICD-10-CM

## 2016-10-01 DIAGNOSIS — I10 Essential (primary) hypertension: Secondary | ICD-10-CM

## 2016-10-01 DIAGNOSIS — R059 Cough, unspecified: Secondary | ICD-10-CM

## 2016-10-01 DIAGNOSIS — E7800 Pure hypercholesterolemia, unspecified: Secondary | ICD-10-CM

## 2016-10-01 NOTE — Progress Notes (Signed)
Pre visit review using our clinic review tool, if applicable. No additional management support is needed unless otherwise documented below in the visit note. 

## 2016-10-01 NOTE — Patient Instructions (Addendum)
Restart amiodarone and take it twice a day  Restart toprol xl 50mg  - take one per day.    Start the blood thinner - eliquis 5mg  - twice a day  Stop irbesartan (avapro).    Follow up appointment with Dr Rockey Situ - 10/06/15 - 10:20.

## 2016-10-01 NOTE — Progress Notes (Signed)
Patient ID: Anita Benjamin, female   DOB: 1934/03/03, 82 y.o.   MRN: [id number]   Subjective:    Patient ID: Anita Benjamin, female    DOB: 11/13/1933, 82 y.o.   MRN: [id number]  HPI  Patient here for a scheduled follow up.  She has a history of tachycardia, SVT and atrial flutter.  Has seen cardiology previously.  Not taking amiodarone or toprol regularly.  Has been doing relatively well.  Has noticed recently some worsening cough.  Described as a dry cough - tickle.  Worsened over the last month.  Some increased heart rate.  Episode this am.  Did not take toprol.  No significant increased sob.  Some occasional acid reflux.  No abdominal pain or cramping.  Bowels stable.  Some worsening sob with exertion.  Noticed more recently.  Eating and drinking.  Bowels stable.     Past Medical History:  Diagnosis Date  . Anemia   . Fibrocystic breast disease   . History of chicken pox   . Hypercholesterolemia   . Hypertension    Past Surgical History:  Procedure Laterality Date  . BREAST BIOPSY Right 2014   NEG  . Laser repair of torn retina     Family History  Problem Relation Age of Onset  . Heart disease Mother 37    Heart attack  . Cancer Father 2    lung cancer  . Breast cancer Paternal Aunt     x3  . Colon cancer Neg Hx    Social History   Social History  . Marital status: Widowed    Spouse name: N/A  . Number of children: 2  . Years of education: N/A   Social History Main Topics  . Smoking status: Never Smoker  . Smokeless tobacco: Never Used  . Alcohol use No  . Drug use: No  . Sexual activity: Not Asked   Other Topics Concern  . None   Social History Narrative  . None    Outpatient Encounter Prescriptions as of 10/01/2016  Medication Sig  . Ascorbic Acid (VITAMIN C) 500 MG CAPS Take 500 mg by mouth daily.  Marland Kitchen aspirin 81 MG tablet Take 81 mg by mouth daily.  . Calcium-Magnesium-Vitamin D (CALCIUM 500 PO) Take by mouth daily.  . fish oil-omega-3 fatty acids 1000 MG  capsule Take 1 g by mouth daily.   . Flaxseed, Linseed, (FLAX SEEDS PO) Take by mouth daily.   . irbesartan (AVAPRO) 150 MG tablet TAKE 1 TABLET BY MOUTH ONCE DAILY  . Multiple Vitamin (MULTIVITAMIN) tablet Take 1 tablet by mouth daily.  Marland Kitchen nystatin cream (MYCOSTATIN) Apply 1 application topically 2 (two) times daily.  . Probiotic Product (PROBIOTIC PO) Take by mouth daily.  . ranitidine (ZANTAC) 150 MG capsule Take 150 mg by mouth 2 (two) times daily.  . Selenium (SELENICAPS-200 PO) Take 200 mg by mouth daily.  . sertraline (ZOLOFT) 50 MG tablet TAKE 1 TABLET BY MOUTH ONCE DAILY.  . [DISCONTINUED] amiodarone (PACERONE) 200 MG tablet Take 1 tablet (200 mg total) by mouth 2 (two) times daily. (Patient taking differently: Take 200 mg by mouth 2 (two) times daily as needed. )  . [DISCONTINUED] metoprolol succinate (TOPROL-XL) 50 MG 24 hr tablet Take 1 tablet (50 mg total) by mouth daily. Take with or immediately following a meal. (Patient taking differently: Take 50 mg by mouth daily as needed. Take with or immediately following a meal.)  . [DISCONTINUED] tizanidine (ZANAFLEX) 2 MG capsule Take 1 capsule (2  mg total) by mouth at bedtime as needed for muscle spasms.   No facility-administered encounter medications on file as of 10/01/2016.     Review of Systems  Constitutional: Negative for appetite change and unexpected weight change.  HENT: Negative for congestion and sinus pressure.   Respiratory: Positive for cough and shortness of breath. Negative for chest tightness.        Some sob with exertion.   Cardiovascular: Negative for chest pain and palpitations.  Gastrointestinal: Negative for abdominal pain, diarrhea, nausea and vomiting.  Genitourinary: Negative for difficulty urinating and dysuria.  Musculoskeletal: Negative for back pain and joint swelling.  Skin: Negative for color change and rash.  Neurological: Negative for dizziness, light-headedness and headaches.    Psychiatric/Behavioral: Negative for agitation and dysphoric mood.       Objective:    Physical Exam  Constitutional: She appears well-developed and well-nourished. No distress.  HENT:  Nose: Nose normal.  Mouth/Throat: Oropharynx is clear and moist.  Neck: Neck supple. No thyromegaly present.  Cardiovascular: Regular rhythm.   Rate - 120-130.    Pulmonary/Chest: Breath sounds normal. No respiratory distress. She has no wheezes.  Abdominal: Soft. Bowel sounds are normal. There is no tenderness.  Musculoskeletal: She exhibits no edema or tenderness.  Lymphadenopathy:    She has no cervical adenopathy.  Skin: No rash noted. No erythema.  Psychiatric: She has a normal mood and affect. Her behavior is normal.    BP 116/74   Pulse (!) 117   Temp 97.8 F (36.6 C) (Oral)   Ht 5\' 6"  (1.676 m)   Wt 132 lb (59.9 kg)   LMP 10/27/1989   SpO2 97%   BMI 21.31 kg/m  Wt Readings from Last 3 Encounters:  10/01/16 132 lb (59.9 kg)  05/27/16 130 lb 6 oz (59.1 kg)  01/20/16 128 lb 8 oz (58.3 kg)     Lab Results  Component Value Date   WBC 3.6 (L) 09/24/2016   HGB 13.0 09/24/2016   HCT 37.9 09/24/2016   PLT 246.0 09/24/2016   GLUCOSE 97 09/24/2016   CHOL 173 09/24/2016   TRIG 88.0 09/24/2016   HDL 61.40 09/24/2016   LDLDIRECT 117.7 11/06/2013   LDLCALC 94 09/24/2016   ALT 15 09/24/2016   AST 17 09/24/2016   NA 142 09/24/2016   K 4.9 09/24/2016   CL 107 09/24/2016   CREATININE 0.87 09/24/2016   BUN 18 09/24/2016   CO2 32 09/24/2016   TSH 2.33 01/13/2016    Mm Screening Breast Tomo Bilateral  Result Date: 01/05/2016 CLINICAL DATA:  Screening. EXAM: 2D DIGITAL SCREENING BILATERAL MAMMOGRAM WITH CAD AND ADJUNCT TOMO COMPARISON:  Previous exam(s). ACR Breast Density Category b: There are scattered areas of fibroglandular density. FINDINGS: There are no findings suspicious for malignancy. Images were processed with CAD. IMPRESSION: No mammographic evidence of malignancy. A  result letter of this screening mammogram will be mailed directly to the patient. RECOMMENDATION: Screening mammogram in one year. (Code:SM-B-01Y) BI-RADS CATEGORY  1: Negative. Electronically Signed   By: Lillia Mountain M.D.   On: 01/05/2016 12:39       Assessment & Plan:   Problem List Items Addressed This Visit    Atrial flutter ([place])    Followed by cardiology.  Increased heart rate this am.  EKG as outlined.  Restart amiodarone and toprol as outlined.  F/u with cardiology 10/05/16.        Cough    Persistent.  Increased recently.  Question if aggravated  by underlying heart issues.  Check cxr.  Increase zantac to bid.  Follow.        Relevant Orders   DG Chest 2 View (Completed)   Hypercholesterolemia    Follow lipid panell.  Follow.   Lab Results  Component Value Date   CHOL 173 09/24/2016   HDL 61.40 09/24/2016   LDLCALC 94 09/24/2016   LDLDIRECT 117.7 11/06/2013   TRIG 88.0 09/24/2016   CHOLHDL 3 09/24/2016        Hypertension    Has been under good control.  Since adding toprol, will hold avapro for now.  May need to add back.  Follow.        Leukopenia    Follow cbc.  Has been relatively stable.        SOB (shortness of breath)    Some sob with exertion.  Treat her atrial flutter as outlined.  Planned f/u with cardiology as outlined.  With the persistent cough, will also check cxr.        Stress    Has been doing relatively well.  Follow.        Tachycardia - Primary    Increased heart rate as outlined.  Noted on exam as well.  Some increased sob with exertion.  EKG - question of atrial flutter with ventricular rate approximately 130.  Discussed with cardiology.  Restart amiodarone bid.  Also start toprol daily.  Have her hold avapro.  Start eliquis.  Samples to be provided by cardiology.  F/u with cardiology 10/05/16.  Any change or problems over the weekend, she is to be evaluated immediately.        Relevant Orders   EKG 12-Lead (Completed)       Einar Pheasant, MD

## 2016-10-04 ENCOUNTER — Encounter: Payer: Self-pay | Admitting: Internal Medicine

## 2016-10-04 NOTE — Assessment & Plan Note (Signed)
Has been under good control.  Since adding toprol, will hold avapro for now.  May need to add back.  Follow.

## 2016-10-04 NOTE — Assessment & Plan Note (Signed)
Persistent.  Increased recently.  Question if aggravated by underlying heart issues.  Check cxr.  Increase zantac to bid.  Follow.

## 2016-10-04 NOTE — Assessment & Plan Note (Addendum)
Follow lipid panell.  Follow.   Lab Results  Component Value Date   CHOL 173 09/24/2016   HDL 61.40 09/24/2016   LDLCALC 94 09/24/2016   LDLDIRECT 117.7 11/06/2013   TRIG 88.0 09/24/2016   CHOLHDL 3 09/24/2016

## 2016-10-04 NOTE — Assessment & Plan Note (Signed)
Followed by cardiology.  Increased heart rate this am.  EKG as outlined.  Restart amiodarone and toprol as outlined.  F/u with cardiology 10/05/16.

## 2016-10-04 NOTE — Assessment & Plan Note (Signed)
Follow cbc.  Has been relatively stable.

## 2016-10-04 NOTE — Assessment & Plan Note (Signed)
Increased heart rate as outlined.  Noted on exam as well.  Some increased sob with exertion.  EKG - question of atrial flutter with ventricular rate approximately 130.  Discussed with cardiology.  Restart amiodarone bid.  Also start toprol daily.  Have her hold avapro.  Start eliquis.  Samples to be provided by cardiology.  F/u with cardiology 10/05/16.  Any change or problems over the weekend, she is to be evaluated immediately.

## 2016-10-04 NOTE — Assessment & Plan Note (Signed)
Has been doing relatively well.  Follow.

## 2016-10-04 NOTE — Assessment & Plan Note (Signed)
Some sob with exertion.  Treat her atrial flutter as outlined.  Planned f/u with cardiology as outlined.  With the persistent cough, will also check cxr.

## 2016-10-05 ENCOUNTER — Encounter: Payer: Self-pay | Admitting: Cardiovascular Disease

## 2016-10-05 ENCOUNTER — Ambulatory Visit (INDEPENDENT_AMBULATORY_CARE_PROVIDER_SITE_OTHER): Payer: Medicare Other | Admitting: Cardiovascular Disease

## 2016-10-05 VITALS — BP 136/70 | HR 49 | Ht 66.0 in | Wt 134.5 lb

## 2016-10-05 DIAGNOSIS — E7800 Pure hypercholesterolemia, unspecified: Secondary | ICD-10-CM | POA: Diagnosis not present

## 2016-10-05 DIAGNOSIS — I10 Essential (primary) hypertension: Secondary | ICD-10-CM

## 2016-10-05 DIAGNOSIS — Z7189 Other specified counseling: Secondary | ICD-10-CM | POA: Insufficient documentation

## 2016-10-05 DIAGNOSIS — R0602 Shortness of breath: Secondary | ICD-10-CM

## 2016-10-05 DIAGNOSIS — I483 Typical atrial flutter: Secondary | ICD-10-CM | POA: Diagnosis not present

## 2016-10-05 MED ORDER — APIXABAN 5 MG PO TABS: 5.0000 mg | ORAL_TABLET | Freq: Two times a day (BID) | ORAL | 6 refills | Status: DC

## 2016-10-05 MED ORDER — METOPROLOL SUCCINATE ER 25 MG PO TB24: 25.0000 mg | ORAL_TABLET | Freq: Every day | ORAL | 3 refills | Status: DC

## 2016-10-05 MED ORDER — AMIODARONE HCL 200 MG PO TABS: 200.0000 mg | ORAL_TABLET | Freq: Every day | ORAL | 3 refills | Status: DC

## 2016-10-05 NOTE — Progress Notes (Signed)
Cardiology Office Note  Date:  10/05/2016   ID:  Anita Benjamin, DOB Oct 10, 1933, MRN [id number]  PCP:  Anita Pheasant, MD   Chief Complaint  Patient presents with  . other    1 yr f/u no complaints today. Meds reviewed verbally with pt.    HPI:  Anita Benjamin is a very pleasant 83 year old woman, patient of Dr. Nicki Benjamin, with a history of tachycardia, SVT episodes, atrial flutter, hypotension. She reports several year history, possibly even up to 10 years of tachyarrhythmia. She presents today for follow-up of her tachycardia  We received a phone call several days ago from primary care, Dr. Nicki Benjamin that her heart rate was elevated greater than 100. Clear change compared to prior EKGs consistent with atrial flutter Symptoms had started that day, on a Friday We recommended she start amiodarone, metoprolol, eliquis.  She reports converting back to normal rhythm that evening, felt less jittery  She presents today on the amiodarone 200 mg twice a day, metoprolol 50 mg sustained release, eliquis 5mg  twice a day Reports feeling well other than some fatigue Previously was only taking metoprolol and amiodarone as needed She reports her weight has been stable Denies any shortness of breath or chest pain on exertion  Mother died at age 90, found down, etiology unclear  EKG on today's visit shows normal sinus rhythm with rate 49 bpm, no significant ST or T-wave changes  Other past medical history On prior office visit she had atrial flutter with ventricular rate 130 bpm. She is not had any recurrent symptoms as she feels very jittery when she is in atrial flutter  In terms of her family history, father had lung cancer, possible heart issues, died at age 72. Mother had a stroke, died at 74  In terms of her social history, she is not a smoker with no smoking history, lives at home with her husband. Is retired Total cholesterol 211  PMH:   has a past medical history of Anemia; Fibrocystic breast  disease; History of chicken pox; Hypercholesterolemia; and Hypertension.  PSH:    Past Surgical History:  Procedure Laterality Date  . BREAST BIOPSY Right 2014   NEG  . Laser repair of torn retina      Current Outpatient Prescriptions  Medication Sig Dispense Refill  . amiodarone (PACERONE) 200 MG tablet Take 1 tablet (200 mg total) by mouth daily. 90 tablet 3  . Ascorbic Acid (VITAMIN C) 500 MG CAPS Take 500 mg by mouth daily.    . Calcium-Magnesium-Vitamin D (CALCIUM 500 PO) Take by mouth daily.    . fish oil-omega-3 fatty acids 1000 MG capsule Take 1 g by mouth daily.     . Flaxseed, Linseed, (FLAX SEEDS PO) Take by mouth daily.     . irbesartan (AVAPRO) 150 MG tablet TAKE 1 TABLET BY MOUTH ONCE DAILY 30 tablet 3  . metoprolol succinate (TOPROL-XL) 25 MG 24 hr tablet Take 1 tablet (25 mg total) by mouth daily. Take with or immediately following a meal. 90 tablet 3  . Multiple Vitamin (MULTIVITAMIN) tablet Take 1 tablet by mouth daily.    Marland Kitchen nystatin cream (MYCOSTATIN) Apply 1 application topically 2 (two) times daily. 30 g 0  . ranitidine (ZANTAC) 150 MG capsule Take 150 mg by mouth 2 (two) times daily.    . Selenium (SELENICAPS-200 PO) Take 200 mg by mouth daily.    . sertraline (ZOLOFT) 50 MG tablet TAKE 1 TABLET BY MOUTH ONCE DAILY. 30 tablet 2  . apixaban (  ELIQUIS) 5 MG TABS tablet Take 1 tablet (5 mg total) by mouth 2 (two) times daily. 60 tablet 6   No current facility-administered medications for this visit.      Allergies:   Oruvail [ketoprofen]; Relafen [nabumetone]; and Timentin [ticarcillin-pot clavulanate]   Social History:  The patient  reports that she has never smoked. She has never used smokeless tobacco. She reports that she does not drink alcohol or use drugs.   Family History:   family history includes Breast cancer in her paternal aunt; Cancer (age of onset: 17) in her father; Heart disease (age of onset: 58) in her mother.    Review of Systems: Review of  Systems  Constitutional: Positive for malaise/fatigue.  Respiratory: Negative.   Cardiovascular: Negative.   Gastrointestinal: Negative.   Musculoskeletal: Negative.   Neurological: Negative.   Psychiatric/Behavioral: Negative.   All other systems reviewed and are negative.    PHYSICAL EXAM: VS:  BP 136/70 (BP Location: Left Arm, Patient Position: Sitting, Cuff Size: Normal)   Pulse (!) 49   Ht 5\' 6"  (1.676 m)   Wt 134 lb 8 oz (61 kg)   LMP 10/27/1989   BMI 21.71 kg/m  , BMI Body mass index is 21.71 kg/m. GEN: Well nourished, well developed, in no acute distress  HEENT: normal  Neck: no JVD, carotid bruits, or masses Cardiac: RRR; no murmurs, rubs, or gallops,no edema  Respiratory:  clear to auscultation bilaterally, normal work of breathing GI: soft, nontender, nondistended, + BS MS: no deformity or atrophy  Skin: warm and dry, no rash Neuro:  Strength and sensation are intact Psych: euthymic mood, full affect  Recent Labs: 01/13/2016: TSH 2.33 09/24/2016: ALT 15; BUN 18; Creatinine, Ser 0.87; Hemoglobin 13.0; Platelets 246.0; Potassium 4.9; Sodium 142    Lipid Panel Lab Results  Component Value Date   CHOL 173 09/24/2016   HDL 61.40 09/24/2016   LDLCALC 94 09/24/2016   TRIG 88.0 09/24/2016      Wt Readings from Last 3 Encounters:  10/05/16 134 lb 8 oz (61 kg)  10/01/16 132 lb (59.9 kg)  05/27/16 130 lb 6 oz (59.1 kg)       ASSESSMENT AND PLAN:  Typical atrial flutter ([facility]) - Plan: EKG 12-Lead She has converted back to normal sinus rhythm likely on Friday evening Now with bradycardia in the setting of taking amiodarone, metoprolol Recommended she decrease amiodarone down to 200 mg daily, decrease metoprolol succinate down to 25 mg daily Suggested she monitor her heart rate We did talk about ablation if she has recurrent symptoms  Essential hypertension We'll decrease metoprolol as above, Avapro on hold for now May need to restart half dose Avapro if  blood pressure runs high She has follow-up with Dr. Nicki Benjamin in several weeks' time  Hypercholesterolemia Currently not on a statin Cholesterol not particularly elevated  SOB (shortness of breath) Sometimes has shortness of breath, jittery feeling when she is in atrial flutter  Encounter for anticoagulation She will stay on anticoagulation given paroxysmal arrhythmia, CHADS VASC at least 4   Total encounter time more than 25 minutes  Greater than 50% was spent in counseling and coordination of care with the patient   Disposition:   F/U  6 months   Orders Placed This Encounter  Procedures  . EKG 12-Lead     Signed, Esmond Plants, M.D., Ph.D. 10/05/2016  Nellie, Laura

## 2016-10-05 NOTE — Patient Instructions (Addendum)
Medication Instructions:   Please take a 1/2 pill of the metoprolol daily (25 mg) Decrease the amiodarone down to one a day Continue on eliquis twice a day  Labwork:  No new labs needed  Testing/Procedures:  No further testing at this time   I recommend watching educational videos on topics of interest to you at:       [email or web address]  Enter code: HEARTCARE    Follow-Up: It was a pleasure seeing you in the office today. Please call us if you have new issues that need to be addressed before your next appt.  [phone]  Your physician wants you to follow-up in: 2 months.   If you need a refill on your cardiac medications before your next appointment, please call your pharmacy.

## 2016-10-07 ENCOUNTER — Ambulatory Visit: Payer: Medicare Other | Admitting: Cardiovascular Disease

## 2016-11-03 ENCOUNTER — Encounter: Payer: Self-pay | Admitting: Internal Medicine

## 2016-11-03 ENCOUNTER — Ambulatory Visit (INDEPENDENT_AMBULATORY_CARE_PROVIDER_SITE_OTHER): Payer: Medicare Other | Admitting: Internal Medicine

## 2016-11-03 DIAGNOSIS — I10 Essential (primary) hypertension: Secondary | ICD-10-CM

## 2016-11-03 DIAGNOSIS — E7800 Pure hypercholesterolemia, unspecified: Secondary | ICD-10-CM

## 2016-11-03 DIAGNOSIS — R7989 Other specified abnormal findings of blood chemistry: Secondary | ICD-10-CM

## 2016-11-03 DIAGNOSIS — R945 Abnormal results of liver function studies: Secondary | ICD-10-CM

## 2016-11-03 DIAGNOSIS — I483 Typical atrial flutter: Secondary | ICD-10-CM

## 2016-11-03 MED ORDER — METOPROLOL SUCCINATE ER 25 MG PO TB24: ORAL_TABLET | ORAL | 2 refills | Status: DC

## 2016-11-03 NOTE — Progress Notes (Signed)
Patient ID: Anita Benjamin, female   DOB: November 15, 1933, 83 y.o.   MRN: [id number]   Subjective:    Patient ID: Anita Benjamin, female    DOB: 1933-12-29, 83 y.o.   MRN: [id number]  HPI  Patient here for a scheduled follow up.  Recently had problems with atrial flutter.  She is on amiodarone and metoprolol.  Her metoprolol was decreased to 25mg  q day when she saw Dr Rockey Situ.  States her heart rate is still in the 40s.  Some fatigue.  No chest pain.  Back in SR. On eliquis.  Eating.  No nausea or vomiting.  Bowels stable.  No sob.     Past Medical History:  Diagnosis Date  . Anemia   . Fibrocystic breast disease   . History of chicken pox   . Hypercholesterolemia   . Hypertension    Past Surgical History:  Procedure Laterality Date  . BREAST BIOPSY Right 2014   NEG  . Laser repair of torn retina     Family History  Problem Relation Age of Onset  . Heart disease Mother 55    Heart attack  . Cancer Father 11    lung cancer  . Breast cancer Paternal Aunt     x3  . Colon cancer Neg Hx    Social History   Social History  . Marital status: Widowed    Spouse name: N/A  . Number of children: 2  . Years of education: N/A   Social History Main Topics  . Smoking status: Never Smoker  . Smokeless tobacco: Never Used  . Alcohol use No  . Drug use: No  . Sexual activity: Not Asked   Other Topics Concern  . None   Social History Narrative  . None    Outpatient Encounter Prescriptions as of 11/03/2016  Medication Sig  . amiodarone (PACERONE) 200 MG tablet Take 1 tablet (200 mg total) by mouth daily.  Marland Kitchen apixaban (ELIQUIS) 5 MG TABS tablet Take 1 tablet (5 mg total) by mouth 2 (two) times daily.  . Ascorbic Acid (VITAMIN C) 500 MG CAPS Take 500 mg by mouth daily.  . Calcium-Magnesium-Vitamin D (CALCIUM 500 PO) Take by mouth daily.  . fish oil-omega-3 fatty acids 1000 MG capsule Take 1 g by mouth daily.   . Flaxseed, Linseed, (FLAX SEEDS PO) Take by mouth daily.   . irbesartan (AVAPRO)  150 MG tablet TAKE 1 TABLET BY MOUTH ONCE DAILY  . Multiple Vitamin (MULTIVITAMIN) tablet Take 1 tablet by mouth daily.  Marland Kitchen nystatin cream (MYCOSTATIN) Apply 1 application topically 2 (two) times daily.  . ranitidine (ZANTAC) 150 MG capsule Take 150 mg by mouth 2 (two) times daily.  . Selenium (SELENICAPS-200 PO) Take 200 mg by mouth daily.  . sertraline (ZOLOFT) 50 MG tablet TAKE 1 TABLET BY MOUTH ONCE DAILY.  . [DISCONTINUED] metoprolol succinate (TOPROL-XL) 25 MG 24 hr tablet Take 1 tablet (25 mg total) by mouth daily. Take with or immediately following a meal. (Patient taking differently: Take 25 mg by mouth daily. Take with or immediately following a meal.)  . metoprolol succinate (TOPROL-XL) 25 MG 24 hr tablet 1/2 tablet per day   No facility-administered encounter medications on file as of 11/03/2016.     Review of Systems  Constitutional: Negative for appetite change and unexpected weight change.  HENT: Negative for congestion and sinus pressure.   Respiratory: Negative for cough, chest tightness and shortness of breath.   Cardiovascular: Negative for chest pain, palpitations and  leg swelling.  Gastrointestinal: Negative for abdominal pain, diarrhea, nausea and vomiting.  Genitourinary: Negative for difficulty urinating and dysuria.  Musculoskeletal: Negative for back pain and joint swelling.  Skin: Negative for color change and rash.  Neurological: Negative for dizziness, light-headedness and headaches.  Psychiatric/Behavioral: Negative for agitation and dysphoric mood.       Objective:    Physical Exam  Constitutional: She appears well-developed and well-nourished. No distress.  HENT:  Nose: Nose normal.  Mouth/Throat: Oropharynx is clear and moist.  Neck: Neck supple. No thyromegaly present.  Cardiovascular: Normal rate and regular rhythm.   Pulmonary/Chest: Breath sounds normal. No respiratory distress. She has no wheezes.  Abdominal: Soft. Bowel sounds are normal. There  is no tenderness.  Musculoskeletal: She exhibits no edema or tenderness.  Lymphadenopathy:    She has no cervical adenopathy.  Skin: No rash noted. No erythema.  Psychiatric: She has a normal mood and affect. Her behavior is normal.    BP (!) 150/70 (BP Location: Left Arm, Patient Position: Sitting, Cuff Size: Normal)   Pulse (!) 50   Temp 98.3 F (36.8 C) (Oral)   Wt 131 lb 4 oz (59.5 kg)   LMP 10/27/1989   SpO2 98%   BMI 21.18 kg/m  Wt Readings from Last 3 Encounters:  11/03/16 131 lb 4 oz (59.5 kg)  10/05/16 134 lb 8 oz (61 kg)  10/01/16 132 lb (59.9 kg)     Lab Results  Component Value Date   WBC 3.6 (L) 09/24/2016   HGB 13.0 09/24/2016   HCT 37.9 09/24/2016   PLT 246.0 09/24/2016   GLUCOSE 97 09/24/2016   CHOL 173 09/24/2016   TRIG 88.0 09/24/2016   HDL 61.40 09/24/2016   LDLDIRECT 117.7 11/06/2013   LDLCALC 94 09/24/2016   ALT 15 09/24/2016   AST 17 09/24/2016   NA 142 09/24/2016   K 4.9 09/24/2016   CL 107 09/24/2016   CREATININE 0.87 09/24/2016   BUN 18 09/24/2016   CO2 32 09/24/2016   TSH 2.33 01/13/2016    Mm Screening Breast Tomo Bilateral  Result Date: 01/05/2016 CLINICAL DATA:  Screening. EXAM: 2D DIGITAL SCREENING BILATERAL MAMMOGRAM WITH CAD AND ADJUNCT TOMO COMPARISON:  Previous exam(s). ACR Breast Density Category b: There are scattered areas of fibroglandular density. FINDINGS: There are no findings suspicious for malignancy. Images were processed with CAD. IMPRESSION: No mammographic evidence of malignancy. A result letter of this screening mammogram will be mailed directly to the patient. RECOMMENDATION: Screening mammogram in one year. (Code:SM-B-01Y) BI-RADS CATEGORY  1: Negative. Electronically Signed   By: Lillia Mountain M.D.   On: 01/05/2016 12:39       Assessment & Plan:   Problem List Items Addressed This Visit    Abnormal liver function test    Last liver function tests are wnl.        Atrial flutter ([place])    In SR now.  Heart rate  in the 40s.  Decrease metoprolol to 25mg  1/2 tablet q day.  Follow heart rate.  Continue eliquis.  On amiodarone.  Continue f/u with cardiology.        Relevant Medications   metoprolol succinate (TOPROL-XL) 25 MG 24 hr tablet   Hypercholesterolemia    Follow lipid panel.        Relevant Medications   metoprolol succinate (TOPROL-XL) 25 MG 24 hr tablet   Hypertension    Blood pressure on recheck improved.  Her checks 110/70s.  Will decrease metoprolol to 25mg   1/2 tablet q day and allow pulse rate to increase.  Follow.        Relevant Medications   metoprolol succinate (TOPROL-XL) 25 MG 24 hr tablet       Einar Pheasant, MD

## 2016-11-03 NOTE — Progress Notes (Signed)
Pre visit review using our clinic review tool, if applicable. No additional management support is needed unless otherwise documented below in the visit note. 

## 2016-11-07 ENCOUNTER — Encounter: Payer: Self-pay | Admitting: Internal Medicine

## 2016-11-07 NOTE — Assessment & Plan Note (Signed)
Blood pressure on recheck improved.  Her checks 110/70s.  Will decrease metoprolol to 25mg  1/2 tablet q day and allow pulse rate to increase.  Follow.

## 2016-11-07 NOTE — Assessment & Plan Note (Signed)
In SR now.  Heart rate in the 40s.  Decrease metoprolol to 25mg  1/2 tablet q day.  Follow heart rate.  Continue eliquis.  On amiodarone.  Continue f/u with cardiology.

## 2016-11-07 NOTE — Assessment & Plan Note (Signed)
Follow lipid panel.   

## 2016-11-07 NOTE — Assessment & Plan Note (Signed)
Last liver function tests are wnl.

## 2016-12-03 ENCOUNTER — Ambulatory Visit (INDEPENDENT_AMBULATORY_CARE_PROVIDER_SITE_OTHER): Payer: Medicare Other | Admitting: Cardiovascular Disease

## 2016-12-03 ENCOUNTER — Encounter: Payer: Self-pay | Admitting: Cardiovascular Disease

## 2016-12-03 VITALS — BP 150/90 | HR 59 | Ht 67.0 in | Wt 132.5 lb

## 2016-12-03 DIAGNOSIS — E7800 Pure hypercholesterolemia, unspecified: Secondary | ICD-10-CM

## 2016-12-03 DIAGNOSIS — I10 Essential (primary) hypertension: Secondary | ICD-10-CM

## 2016-12-03 DIAGNOSIS — I483 Typical atrial flutter: Secondary | ICD-10-CM

## 2016-12-03 DIAGNOSIS — R5382 Chronic fatigue, unspecified: Secondary | ICD-10-CM | POA: Diagnosis not present

## 2016-12-03 DIAGNOSIS — Z7189 Other specified counseling: Secondary | ICD-10-CM

## 2016-12-03 NOTE — Progress Notes (Signed)
Cardiology Office Note  Date:  12/03/2016   ID:  Anita Benjamin, DOB 1934-08-03, MRN [id number]  PCP:  Einar Pheasant, MD   Chief Complaint  Patient presents with  . other    95mo f/u. Pt c/o shaking of extremities. Reviewed meds with pt verbally.    HPI:  Anita Benjamin is a very pleasant 83 year old woman, patient of Dr. Nicki Reaper, with a history of tachycardia, SVT episodes, atrial flutter, hypotension. She reports several year history, possibly even up to 10 years of tachyarrhythmia. She presents today for follow-up of her tachycardia  When she was seen by Dr. Nicki Reaper 10/01/2016 she had atrial flutter rate 120 bpm  started on amiodarone, metoprolol, eliquis.  She converted back to normal sinus rhythm, felt less jittery  On her last clinic visit we decreased the dose of metoprolol down to 12.5 mg daily, amiodarone 200 mg daily and she stayed on eliquis 5mg  twice a day  In follow-up today she has continued fatigue all day long always taking naps morning afternoon evening Attributes her symptoms to the medication Would like to cut back some of her pills if possible Denies any shortness of breath or chest pain on exertion  apart from fatigue feels fine She reports that she already takes her pills in the evening  Mother died at age 68, found down, etiology unclear  EKG on today's visit shows normal sinus rhythm with rate 59 bpm, no significant ST or T-wave changes  Other past medical history On prior office visit she had atrial flutter with ventricular rate 130 bpm. She is not had any recurrent symptoms as she feels very jittery when she is in atrial flutter  In terms of her family history, father had lung cancer, possible heart issues, died at age 43. Mother had a stroke, died at 28  In terms of her social history, she is not a smoker with no smoking history, lives at home with her husband. Is retired Total cholesterol 211  PMH:   has a past medical history of Anemia; Fibrocystic  breast disease; History of chicken pox; Hypercholesterolemia; and Hypertension.  PSH:    Past Surgical History:  Procedure Laterality Date  . BREAST BIOPSY Right 2014   NEG  . Laser repair of torn retina      Current Outpatient Prescriptions  Medication Sig Dispense Refill  . amiodarone (PACERONE) 200 MG tablet Take 1 tablet (200 mg total) by mouth daily. 90 tablet 3  . apixaban (ELIQUIS) 5 MG TABS tablet Take 1 tablet (5 mg total) by mouth 2 (two) times daily. 60 tablet 6  . Ascorbic Acid (VITAMIN C) 500 MG CAPS Take 500 mg by mouth daily.    . Calcium-Magnesium-Vitamin D (CALCIUM 500 PO) Take by mouth daily.    . fish oil-omega-3 fatty acids 1000 MG capsule Take 1 g by mouth daily.     . Flaxseed, Linseed, (FLAX SEEDS PO) Take by mouth daily.     . metoprolol succinate (TOPROL-XL) 25 MG 24 hr tablet 1/2 tablet per day 30 tablet 2  . Multiple Vitamin (MULTIVITAMIN) tablet Take 1 tablet by mouth daily.    Marland Kitchen nystatin cream (MYCOSTATIN) Apply 1 application topically 2 (two) times daily. 30 g 0  . ranitidine (ZANTAC) 150 MG capsule Take 150 mg by mouth 2 (two) times daily.    . Selenium (SELENICAPS-200 PO) Take 200 mg by mouth daily.    . sertraline (ZOLOFT) 50 MG tablet Take 25 mg by mouth daily.  No current facility-administered medications for this visit.      Allergies:   Oruvail [ketoprofen]; Relafen [nabumetone]; and Timentin [ticarcillin-pot clavulanate]   Social History:  The patient  reports that she has never smoked. She has never used smokeless tobacco. She reports that she does not drink alcohol or use drugs.   Family History:   family history includes Breast cancer in her paternal aunt; Cancer (age of onset: 27) in her father; Heart disease (age of onset: 31) in her mother.    Review of Systems: Review of Systems  Constitutional: Positive for malaise/fatigue.  Respiratory: Negative.   Cardiovascular: Negative.   Gastrointestinal: Negative.   Musculoskeletal:  Negative.   Neurological: Positive for weakness.  Psychiatric/Behavioral: Negative.   All other systems reviewed and are negative.    PHYSICAL EXAM: VS:  BP (!) 150/90 (BP Location: Left Arm, Patient Position: Sitting, Cuff Size: Normal)   Pulse (!) 59   Ht 5\' 7"  (1.702 m)   Wt 132 lb 8 oz (60.1 kg)   LMP 10/27/1989   BMI 20.75 kg/m  , BMI Body mass index is 20.75 kg/m. GEN: Well nourished, well developed, in no acute distress  HEENT: normal  Neck: no JVD, carotid bruits, or masses Cardiac: RRR; no murmurs, rubs, or gallops,no edema  Respiratory:  clear to auscultation bilaterally, normal work of breathing GI: soft, nontender, nondistended, + BS MS: no deformity or atrophy  Skin: warm and dry, no rash Neuro:  Strength and sensation are intact Psych: euthymic mood, full affect    Recent Labs: 01/13/2016: TSH 2.33 09/24/2016: ALT 15; BUN 18; Creatinine, Ser 0.87; Hemoglobin 13.0; Platelets 246.0; Potassium 4.9; Sodium 142    Lipid Panel Lab Results  Component Value Date   CHOL 173 09/24/2016   HDL 61.40 09/24/2016   LDLCALC 94 09/24/2016   TRIG 88.0 09/24/2016      Wt Readings from Last 3 Encounters:  12/03/16 132 lb 8 oz (60.1 kg)  11/03/16 131 lb 4 oz (59.5 kg)  10/05/16 134 lb 8 oz (61 kg)       ASSESSMENT AND PLAN:  Essential hypertension - Plan: EKG 12-Lead Blood pressure is well controlled on today's visit.  We may hold the metoprolol for fatigue  Typical atrial flutter ([place]) - Plan: EKG 12-Lead Long discussion concerning her flutter, discussed anticoagulation, medications in detail. Having problems with fatigue Suspect this is less likely her medications but recommended she try to hold the metoprolol If this does not help her symptoms, would go back on metoprolol and cut the amiodarone in half Potentially could try alternate antiarrhythmic such as flecainide We did discuss even ablation if she is unable to tolerate medications  Chronic fatigue -  Plan: EKG 12-Lead  Etiology unclear, also concerning for depression We will try to hold metoprolol if this does not help we will try to wean down on the amiodarone Recommend she start a regular exercise program  Encounter for anticoagulation discussion and counseling Long discussion concerning the cost of eliquis, Reports she is unable to afford it at this time. Long time spent discussing the to deductible , co-pays, donut hole We did suggest warfarin if unable to afford the medication   Total encounter time more than 25 minutes  Greater than 50% was spent in counseling and coordination of care with the patient   Disposition:   F/U  6 months   Orders Placed This Encounter  Procedures  . EKG 12-Lead     Signed, Esmond Plants, M.D.,  Ph.D. 12/03/2016  [place], [place]

## 2016-12-03 NOTE — Patient Instructions (Addendum)
Please monitor heart rate at home If it runs fast, call the office   Medication Instructions:   Ok to play with the dosing of the amiodarone  1/2 pill?  Labwork:  No new labs needed  Testing/Procedures:  No further testing at this time   I recommend watching educational videos on topics of interest to you at:       [email or web address]  Enter code: HEARTCARE    Follow-Up: It was a pleasure seeing you in the office today. Please call us if you have new issues that need to be addressed before your next appt.  [phone]  Your physician wants you to follow-up in: 6 months.  You will receive a reminder letter in the mail two months in advance. If you don't receive a letter, please call our office to schedule the follow-up appointment.  If you need a refill on your cardiac medications before your next appointment, please call your pharmacy.

## 2017-01-06 ENCOUNTER — Other Ambulatory Visit: Payer: Self-pay | Admitting: Internal Medicine

## 2017-01-07 NOTE — Telephone Encounter (Signed)
I only see a Rx for Zoloft from another provider. Did you want to refill Zoloft?

## 2017-01-08 NOTE — Telephone Encounter (Signed)
rx ok'd for zoloft #30 with one refill.

## 2017-01-31 ENCOUNTER — Ambulatory Visit: Payer: Medicare Other | Admitting: Internal Medicine

## 2017-02-03 ENCOUNTER — Ambulatory Visit: Payer: Medicare Other | Admitting: Internal Medicine

## 2017-02-07 ENCOUNTER — Other Ambulatory Visit: Payer: Self-pay | Admitting: Internal Medicine

## 2017-02-07 DIAGNOSIS — Z1231 Encounter for screening mammogram for malignant neoplasm of breast: Secondary | ICD-10-CM

## 2017-02-10 ENCOUNTER — Encounter: Payer: Self-pay | Admitting: Internal Medicine

## 2017-02-10 ENCOUNTER — Ambulatory Visit (INDEPENDENT_AMBULATORY_CARE_PROVIDER_SITE_OTHER): Payer: Medicare Other | Admitting: Internal Medicine

## 2017-02-10 VITALS — BP 142/76 | HR 68 | Temp 98.4°F | Resp 12 | Ht 67.0 in | Wt 130.4 lb

## 2017-02-10 DIAGNOSIS — R079 Chest pain, unspecified: Secondary | ICD-10-CM

## 2017-02-10 DIAGNOSIS — I10 Essential (primary) hypertension: Secondary | ICD-10-CM | POA: Diagnosis not present

## 2017-02-10 DIAGNOSIS — I483 Typical atrial flutter: Secondary | ICD-10-CM | POA: Diagnosis not present

## 2017-02-10 DIAGNOSIS — E2839 Other primary ovarian failure: Secondary | ICD-10-CM

## 2017-02-10 DIAGNOSIS — Z0000 Encounter for general adult medical examination without abnormal findings: Secondary | ICD-10-CM

## 2017-02-10 DIAGNOSIS — E7800 Pure hypercholesterolemia, unspecified: Secondary | ICD-10-CM

## 2017-02-10 DIAGNOSIS — D72819 Decreased white blood cell count, unspecified: Secondary | ICD-10-CM

## 2017-02-10 LAB — CBC WITH DIFFERENTIAL/PLATELET
Basophils Absolute: 0.1 10*3/uL (ref 0.0–0.1)
Basophils Relative: 1.9 % (ref 0.0–3.0)
Eosinophils Absolute: 0.1 10*3/uL (ref 0.0–0.7)
Eosinophils Relative: 2.1 % (ref 0.0–5.0)
HCT: 42.8 % (ref 36.0–46.0)
Hemoglobin: 14.3 g/dL (ref 12.0–15.0)
Lymphocytes Relative: 25.5 % (ref 12.0–46.0)
Lymphs Abs: 1.3 10*3/uL (ref 0.7–4.0)
MCHC: 33.5 g/dL (ref 30.0–36.0)
MCV: 94.7 fl (ref 78.0–100.0)
Monocytes Absolute: 0.4 10*3/uL (ref 0.1–1.0)
Monocytes Relative: 6.8 % (ref 3.0–12.0)
Neutro Abs: 3.3 10*3/uL (ref 1.4–7.7)
Neutrophils Relative %: 63.7 % (ref 43.0–77.0)
Platelets: 260 10*3/uL (ref 150.0–400.0)
RBC: 4.52 Mil/uL (ref 3.87–5.11)
RDW: 13.6 % (ref 11.5–15.5)
WBC: 5.2 10*3/uL (ref 4.0–10.5)

## 2017-02-10 LAB — BASIC METABOLIC PANEL
BUN: 15 mg/dL (ref 6–23)
CO2: 30 mEq/L (ref 19–32)
Calcium: 9.9 mg/dL (ref 8.4–10.5)
Chloride: 105 mEq/L (ref 96–112)
Creatinine, Ser: 1.01 mg/dL (ref 0.40–1.20)
GFR: 55.92 mL/min — ABNORMAL LOW (ref >60.00)
Glucose, Bld: 96 mg/dL (ref 70–99)
Potassium: 4.6 mEq/L (ref 3.5–5.1)
Sodium: 142 mEq/L (ref 135–145)

## 2017-02-10 LAB — TSH: TSH: 4.07 u[IU]/mL (ref 0.35–4.50)

## 2017-02-10 NOTE — Patient Instructions (Addendum)
Stop amiodarone.     These are the goals we discussed: Goals    . Increase physical activity          Stay active and walk for exercise, increase as tolerated       This is a list of the screening recommended for you and due dates:  Health Maintenance  Topic Date Due  . DEXA scan (bone density measurement)  03/06/2001  . Pneumonia vaccines (2 of 2 - PPSV23) 10/20/2016  . Mammogram  01/04/2017  . Flu Shot  05/04/2017  . Tetanus Vaccine  10/27/2022

## 2017-02-10 NOTE — Progress Notes (Addendum)
Subjective:   Kamaiya Antilla is a 83 y.o. female who presents for an Initial Medicare Annual Wellness Visit.  Review of Systems    No ROS.  Medicare Wellness Visit.  Cardiac Risk Factors include: advanced age (>56men, >66 women);hypertension     Objective:    Today's Vitals   02/10/17 1016 02/10/17 1102  BP: (!) 155/80 (!) 142/76  Pulse: 68   Resp: 12   Temp: 98.4 F (36.9 C)   TempSrc: Oral   SpO2: 98%   Weight: 130 lb 6.4 oz (59.1 kg)   Height: 5\' 7"  (1.702 m)    Body mass index is 20.42 kg/m.   Current Medications (verified) Outpatient Encounter Prescriptions as of 02/10/2017  Medication Sig  . amiodarone (PACERONE) 200 MG tablet Take 1 tablet (200 mg total) by mouth daily. (Patient taking differently: Take 200 mg by mouth daily. )  . apixaban (ELIQUIS) 5 MG TABS tablet Take 1 tablet (5 mg total) by mouth 2 (two) times daily.  . Ascorbic Acid (VITAMIN C) 500 MG CAPS Take 500 mg by mouth daily.  . Calcium-Magnesium-Vitamin D (CALCIUM 500 PO) Take by mouth daily.  . fish oil-omega-3 fatty acids 1000 MG capsule Take 1 g by mouth daily.   . Flaxseed, Linseed, (FLAX SEEDS PO) Take by mouth daily.   . Multiple Vitamin (MULTIVITAMIN) tablet Take 1 tablet by mouth daily.  Marland Kitchen nystatin cream (MYCOSTATIN) Apply 1 application topically 2 (two) times daily.  . ranitidine (ZANTAC) 150 MG capsule Take 150 mg by mouth 2 (two) times daily.  . Selenium (SELENICAPS-200 PO) Take 200 mg by mouth daily.  . sertraline (ZOLOFT) 50 MG tablet TAKE 1 TABLET BY MOUTH ONCE DAILY  . [DISCONTINUED] sertraline (ZOLOFT) 50 MG tablet Take 25 mg by mouth daily.  . metoprolol succinate (TOPROL-XL) 25 MG 24 hr tablet 1/2 tablet per day (Patient not taking: Reported on 02/10/2017)   No facility-administered encounter medications on file as of 02/10/2017.     Allergies (verified) Oruvail [ketoprofen]; Relafen [nabumetone]; and Timentin [ticarcillin-pot clavulanate]   History: Past Medical History:    Diagnosis Date  . Anemia   . Fibrocystic breast disease   . History of chicken pox   . Hypercholesterolemia   . Hypertension    Past Surgical History:  Procedure Laterality Date  . BREAST BIOPSY Right 2014   NEG  . Laser repair of torn retina     Family History  Problem Relation Age of Onset  . Heart disease Mother 43       Heart attack  . Cancer Father 29       lung cancer  . Breast cancer Paternal Aunt        x3  . Colon cancer Neg Hx    Social History   Occupational History  . Not on file.   Social History Main Topics  . Smoking status: Never Smoker  . Smokeless tobacco: Never Used  . Alcohol use No  . Drug use: No  . Sexual activity: Not on file    Tobacco Counseling Counseling given: Not Answered   Activities of Daily Living In your present state of health, do you have any difficulty performing the following activities: 02/10/2017  Hearing? N  Vision? N  Difficulty concentrating or making decisions? N  Walking or climbing stairs? N  Dressing or bathing? N  Doing errands, shopping? N  Preparing Food and eating ? N  Using the Toilet? N  In the past six months, have you  accidently leaked urine? N  Do you have problems with loss of bowel control? N  Managing your Medications? N  Managing your Finances? N  Housekeeping or managing your Housekeeping? N  Some recent data might be hidden    Immunizations and Health Maintenance Immunization History  Administered Date(s) Administered  . Influenza,inj,Quad PF,36+ Mos 06/27/2013, 07/04/2014, 06/05/2015, 05/27/2016  . Pneumococcal Conjugate-13 10/21/2015  . Tdap 10/27/2012   Health Maintenance Due  Topic Date Due  . DEXA SCAN  03/06/2001  . PNA vac Low Risk Adult (2 of 2 - PPSV23) 10/20/2016  . MAMMOGRAM  01/04/2017    Patient Care Team: Einar Pheasant, MD as PCP - General (Internal Medicine) Bary Castilla Forest Gleason, MD as Consulting Physician (General Surgery) Minna Merritts, MD as Consulting  Physician (Cardiology)  Indicate any recent Medical Services you may have received from other than [facility] providers in the past year (date may be approximate).     Assessment:   This is a routine wellness examination for Irine. The goal of the wellness visit is to assist the patient how to close the gaps in care and create a preventative care plan for the patient.   Taking calcium VIT D as appropriate/Osteoporosis risk reviewed.  Medications reviewed; taking without issues or barriers.  Safety issues reviewed; smoke detectors in the home. No firearms in the home.  Wears seatbelts when driving or riding with others. Patient does wear sunscreen or protective clothing when in direct sunlight. No violence in the home.  Depression- PHQ 2 &9 complete.  No signs/symptoms or verbal communication regarding little pleasure in doing things, feeling down, depressed or hopeless. No changes in sleeping, energy, eating, concentrating.  No thoughts of self harm or harm towards others.  Time spent on this topic is 8 minutes.   Patient is alert, normal appearance, oriented to person/place/and time. Correctly identified the president of the [place], recall of 3/3 words, and performing simple calculations.  Patient displays appropriate judgement and can read correct time from watch face.  No new identified risk were noted.  No failures at ADL's or IADL's.   BMI- discussed the importance of a healthy diet, water intake and exercise. Educational material provided.   24 hour diet recall: Breakfast: Oatmeal, fruit Lunch: Half deli sandwich, fruit, nabs Dinner: Green vegetable, bread Daily fluid intake:  cups of caffeine, 3 cups of water, 1 cup of juice  HTN- followed by PCP.  Dental- every 12 months.  Dr. Juleen China.  Sleep patterns- Sleeps 7-8 hours at night.  Wakes feeling rested.  Mammogram scheduled 03/01/17.  Dexa Scan discussed.    Pneumovax 23 vaccine discussed.  Patient Concerns: None at this  time. Follow up with PCP as needed.  Hearing/Vision screen Hearing Screening Comments: Patient is able to hear conversational tones without difficulty.  No issues reported.   Vision Screening Comments: Followed by [facility] (Dr. Jeni Salles) Wears corrective lenses Last OV 06/2016 Cataract extraction, bilateral Visual acuity not assessed per patient preference since they have regular follow up with the ophthalmologist  Dietary issues and exercise activities discussed: Current Exercise Habits: The patient does not participate in regular exercise at present  Goals    . Increase physical activity          Stay active and walk for exercise, increase as tolerated      Depression Screen PHQ 2/9 Scores 02/10/2017 02/10/2017 01/20/2016 10/21/2015 03/19/2015 11/05/2014 10/30/2013  PHQ - 2 Score 0 0 0 0 0 5 0  PHQ- 9  Score 0 0 - - - 8 -    Fall Risk Fall Risk  02/10/2017 01/20/2016 10/21/2015 03/19/2015 11/05/2014  Falls in the past year? No No No Yes No  Number falls in past yr: - - - 2 or more -  Injury with Fall? - - - Yes -    Cognitive Function: MMSE - Mini Mental State Exam 02/10/2017  Orientation to time 5  Orientation to Place 5  Registration 3  Attention/ Calculation 5  Recall 3  Language- name 2 objects 2  Language- repeat 1  Language- follow 3 step command 3  Language- read & follow direction 1  Write a sentence 1  Copy design 1  Total score 30        Screening Tests Health Maintenance  Topic Date Due  . DEXA SCAN  03/06/2001  . PNA vac Low Risk Adult (2 of 2 - PPSV23) 10/20/2016  . MAMMOGRAM  01/04/2017  . INFLUENZA VACCINE  05/04/2017  . TETANUS/TDAP  10/27/2022      Plan:   End of life planning; Advanced aging; Advanced directives discussed.  No HCPOA/Living Will.  Additional information declined at this time.  I have personally reviewed and noted the following in the patient's chart:   . Medical and social history . Use of alcohol, tobacco or  illicit drugs  . Current medications and supplements . Functional ability and status . Nutritional status . Physical activity . Advanced directives . List of other physicians . Hospitalizations, surgeries, and ER visits in previous 12 months . Vitals . Screenings to include cognitive, depression, and falls . Referrals and appointments  In addition, I have reviewed and discussed with patient certain preventive protocols, quality metrics, and best practice recommendations. A written personalized care plan for preventive services as well as general preventive health recommendations were provided to patient.     Varney Biles, LPN   2/[id number]/6811    Reviewed above information.  Agree with plan.  Dr Nicki Reaper

## 2017-02-10 NOTE — Progress Notes (Signed)
Patient ID: Marga Gramajo, female   DOB: Sep 10, 1934, 83 y.o.   MRN: [id number]   Subjective:    Patient ID: [name], female    DOB: Apr 16, 1934, 83 y.o.   MRN: [id number]  HPI  Patient here for a scheduled follow up.  She has been seeing Dr Rockey Situ for tachycardia/atrial flutter.  Was on metoprolol.  Stopped after last visit in hopes of improving her fatigue.  She is on amiodarone.  Feels a lot of her symptoms are related to the amiodarone.  She is taking 1/2 tablet daily now.  States once she went off metoprolol, her blood pressure increased.  She started herself back on avapro 1/2 tablet per day.  States blood pressure remaining elevated.  Her checks averaging 134-150s/70s.  States was 166/80 this am.  Still with increased fatigue.  No increased heart rate or palpitations.  No sob.  No abdominal pain.  Bowel stable.  Eating.     Past Medical History:  Diagnosis Date  . Anemia   . Fibrocystic breast disease   . History of chicken pox   . Hypercholesterolemia   . Hypertension    Past Surgical History:  Procedure Laterality Date  . BREAST BIOPSY Right 2014   NEG  . Laser repair of torn retina     Family History  Problem Relation Age of Onset  . Heart disease Mother 76       Heart attack  . Cancer Father 52       lung cancer  . Breast cancer Paternal Aunt        x3  . Colon cancer Neg Hx    Social History   Social History  . Marital status: Widowed    Spouse name: N/A  . Number of children: 2  . Years of education: N/A   Social History Main Topics  . Smoking status: Never Smoker  . Smokeless tobacco: Never Used  . Alcohol use No  . Drug use: No  . Sexual activity: Not Asked   Other Topics Concern  . None   Social History Narrative  . None    Outpatient Encounter Prescriptions as of 02/10/2017  Medication Sig  . apixaban (ELIQUIS) 5 MG TABS tablet Take 1 tablet (5 mg total) by mouth 2 (two) times daily.  . Ascorbic Acid (VITAMIN C) 500 MG CAPS Take 500 mg by mouth  daily.  . Calcium-Magnesium-Vitamin D (CALCIUM 500 PO) Take by mouth daily.  . fish oil-omega-3 fatty acids 1000 MG capsule Take 1 g by mouth daily.   . Flaxseed, Linseed, (FLAX SEEDS PO) Take by mouth daily.   . Multiple Vitamin (MULTIVITAMIN) tablet Take 1 tablet by mouth daily.  Marland Kitchen nystatin cream (MYCOSTATIN) Apply 1 application topically 2 (two) times daily.  . ranitidine (ZANTAC) 150 MG capsule Take 150 mg by mouth 2 (two) times daily.  . Selenium (SELENICAPS-200 PO) Take 200 mg by mouth daily.  . sertraline (ZOLOFT) 50 MG tablet TAKE 1 TABLET BY MOUTH ONCE DAILY  . [DISCONTINUED] amiodarone (PACERONE) 200 MG tablet Take 1 tablet (200 mg total) by mouth daily. (Patient taking differently: Take 200 mg by mouth daily. )  . [DISCONTINUED] sertraline (ZOLOFT) 50 MG tablet Take 25 mg by mouth daily.  . [DISCONTINUED] metoprolol succinate (TOPROL-XL) 25 MG 24 hr tablet 1/2 tablet per day (Patient not taking: Reported on 02/10/2017)   No facility-administered encounter medications on file as of 02/10/2017.     Review of Systems  Constitutional: Positive for fatigue. Negative  for appetite change and unexpected weight change.  HENT: Negative for congestion and sinus pressure.   Respiratory: Negative for cough, chest tightness and shortness of breath.   Cardiovascular: Negative for chest pain, palpitations and leg swelling.  Gastrointestinal: Negative for abdominal pain, diarrhea, nausea and vomiting.  Genitourinary: Negative for difficulty urinating and dysuria.  Musculoskeletal: Negative for back pain and joint swelling.  Skin: Negative for color change and rash.  Neurological: Negative for dizziness, light-headedness and headaches.  Psychiatric/Behavioral: Negative for agitation and dysphoric mood.       Objective:    Physical Exam  Constitutional: She appears well-developed and well-nourished. No distress.  HENT:  Nose: Nose normal.  Mouth/Throat: Oropharynx is clear and moist.    Neck: Neck supple. No thyromegaly present.  Cardiovascular: Normal rate and regular rhythm.   Pulmonary/Chest: Breath sounds normal. No respiratory distress. She has no wheezes.  Abdominal: Soft. Bowel sounds are normal. There is no tenderness.  Musculoskeletal: She exhibits no edema or tenderness.  Lymphadenopathy:    She has no cervical adenopathy.  Skin: No rash noted. No erythema.  Psychiatric: She has a normal mood and affect. Her behavior is normal.    BP (!) 142/76 (BP Location: Left Arm, Cuff Size: Normal)   Pulse 68   Temp 98.4 F (36.9 C) (Oral)   Resp 12   Ht 5\' 7"  (1.702 m)   Wt 130 lb 6.4 oz (59.1 kg)   LMP 10/27/1989   SpO2 98%   BMI 20.42 kg/m  Wt Readings from Last 3 Encounters:  02/10/17 130 lb 6.4 oz (59.1 kg)  12/03/16 132 lb 8 oz (60.1 kg)  11/03/16 131 lb 4 oz (59.5 kg)     Lab Results  Component Value Date   WBC 5.2 02/10/2017   HGB 14.3 02/10/2017   HCT 42.8 02/10/2017   PLT 260.0 02/10/2017   GLUCOSE 96 02/10/2017   CHOL 173 09/24/2016   TRIG 88.0 09/24/2016   HDL 61.40 09/24/2016   LDLDIRECT 117.7 11/06/2013   LDLCALC 94 09/24/2016   ALT 15 09/24/2016   AST 17 09/24/2016   NA 142 02/10/2017   K 4.6 02/10/2017   CL 105 02/10/2017   CREATININE 1.01 02/10/2017   BUN 15 02/10/2017   CO2 30 02/10/2017   TSH 4.07 02/10/2017    Mm Screening Breast Tomo Bilateral  Result Date: 01/05/2016 CLINICAL DATA:  Screening. EXAM: 2D DIGITAL SCREENING BILATERAL MAMMOGRAM WITH CAD AND ADJUNCT TOMO COMPARISON:  Previous exam(s). ACR Breast Density Category b: There are scattered areas of fibroglandular density. FINDINGS: There are no findings suspicious for malignancy. Images were processed with CAD. IMPRESSION: No mammographic evidence of malignancy. A result letter of this screening mammogram will be mailed directly to the patient. RECOMMENDATION: Screening mammogram in one year. (Code:SM-B-01Y) BI-RADS CATEGORY  1: Negative. Electronically Signed   By:  Lillia Mountain M.D.   On: 01/05/2016 12:39       Assessment & Plan:   Problem List Items Addressed This Visit    Atrial flutter ([place])    In SR now.  On amiodarone.  Increased fatigue.  She feels is related to amiodarone.  Discussed with Dr Rockey Situ.  Will stop amiodarone.  . Follow symptoms.  F/u with cardiology.  Discussed possible ablation.        Relevant Orders   TSH (Completed)   CBC with Differential/Platelet (Completed)   Basic metabolic panel (Completed)   Chest pain    At the end of the visit, she  mentioned that she had an episode of chest pain.  This occurred several weeks ago.  Resolved on its on.  Has not reoccurred.  EKG - SR with no acute ischemic changes.  Will have cardiology evaluate.        Relevant Orders   EKG 12-Lead (Completed)   Hypercholesterolemia    Follow lipid panel.        Hypertension    Blood pressure elevated.  Recheck today 170-180/70-80s.  Increase avapro to one whole tablet per day.  Follow pressures.  Follow metabolic panel.        Leukopenia    Follow cbc.         Other Visit Diagnoses    Encounter for Medicare annual wellness exam    -  Primary   Estrogen deficiency       Relevant Orders   DG Bone Density      I spent 40 minutes with the patient and more than 50% of the time was spent in consultation regarding the above.  Time spent discussing her current symptoms and issues.  Time also spent discussing her concerns regarding her medications.  We also discussed medication changes and plans for f/u with cardiology.     Einar Pheasant, MD

## 2017-02-12 ENCOUNTER — Encounter: Payer: Self-pay | Admitting: Internal Medicine

## 2017-02-12 DIAGNOSIS — R079 Chest pain, unspecified: Secondary | ICD-10-CM | POA: Insufficient documentation

## 2017-02-12 NOTE — Assessment & Plan Note (Signed)
At the end of the visit, she mentioned that she had an episode of chest pain.  This occurred several weeks ago.  Resolved on its on.  Has not reoccurred.  EKG - SR with no acute ischemic changes.  Will have cardiology evaluate.

## 2017-02-12 NOTE — Assessment & Plan Note (Signed)
Follow lipid panel.   

## 2017-02-12 NOTE — Assessment & Plan Note (Signed)
Blood pressure elevated.  Recheck today 170-180/70-80s.  Increase avapro to one whole tablet per day.  Follow pressures.  Follow metabolic panel.

## 2017-02-12 NOTE — Assessment & Plan Note (Signed)
Follow cbc.  

## 2017-02-12 NOTE — Assessment & Plan Note (Signed)
In SR now.  On amiodarone.  Increased fatigue.  She feels is related to amiodarone.  Discussed with Dr Rockey Situ.  Will stop amiodarone.  . Follow symptoms.  F/u with cardiology.  Discussed possible ablation.

## 2017-02-19 DIAGNOSIS — R5383 Other fatigue: Secondary | ICD-10-CM | POA: Insufficient documentation

## 2017-02-19 NOTE — Progress Notes (Signed)
Cardiology Office Note  Date:  02/21/2017   ID:  Anita Benjamin, DOB 1934/02/14, MRN [id number]  PCP:  Einar Pheasant, MD   Chief Complaint  Patient presents with  . other    Pt. c/o 2 weeks ago having a spell of chest pain that radiated up her jaw and to her ear. Meds reviewed by the pt. verbally.      HPI:  Anita Benjamin is a very pleasant 83 year old woman, patient of Dr. Nicki Reaper, with a history of tachycardia, SVT episodes,  atrial flutter, hypotension.  several year history, possibly even up to 10 years of tachyarrhythmia. She presents today for follow-up of her tachycardia and chest pain  In follow-up today she reports having one episode of chest painOne month ago, end of April 2018 Chest pain, to throat, dizzy while driving, symptoms lasted 15 min She does not think it was arrhythmia Went away on its own,  No further episodes since that time Denies any tachycardia concerning for arrhythmia Bradycardia on today's visit , no sx More energy off the meds, metoprolol amiodarone  EKG on today's visit shows normal sinus rhythm with rate 57bpm, no significant ST or T-wave changes  Other past medical history reviewed 10/01/2016 she had atrial flutter rate 120 bpm  started on amiodarone, metoprolol, eliquis.  She converted back to normal sinus rhythm  Mother died at age 14, found down, etiology unclear  On prior office visit she had atrial flutter with ventricular rate 130 bpm. She is not had any recurrent symptoms as she feels very jittery when she is in atrial flutter  In terms of her family history, father had lung cancer, possible heart issues, died at age 4. Mother had a stroke, died at 41  In terms of her social history, she is not a smoker with no smoking history, lives at home with her husband. Is retired Total cholesterol 211  PMH:   has a past medical history of Anemia; Fibrocystic breast disease; History of chicken pox; Hypercholesterolemia; and Hypertension.  PSH:     Past Surgical History:  Procedure Laterality Date  . BREAST BIOPSY Right 2014   NEG  . Laser repair of torn retina      Current Outpatient Prescriptions  Medication Sig Dispense Refill  . apixaban (ELIQUIS) 5 MG TABS tablet Take 1 tablet (5 mg total) by mouth 2 (two) times daily. 60 tablet 6  . Ascorbic Acid (VITAMIN C) 500 MG CAPS Take 500 mg by mouth daily.    . Calcium-Magnesium-Vitamin D (CALCIUM 500 PO) Take by mouth daily.    . fish oil-omega-3 fatty acids 1000 MG capsule Take 1 g by mouth daily.     . Flaxseed, Linseed, (FLAX SEEDS PO) Take by mouth daily.     . Multiple Vitamin (MULTIVITAMIN) tablet Take 1 tablet by mouth daily.    . ranitidine (ZANTAC) 150 MG capsule Take 150 mg by mouth 2 (two) times daily.    . Selenium (SELENICAPS-200 PO) Take 200 mg by mouth daily.    . sertraline (ZOLOFT) 50 MG tablet TAKE 1 TABLET BY MOUTH ONCE DAILY (Patient taking differently: TAKE 1/2 TABLET BY MOUTH ONCE DAILY) 30 tablet 1   No current facility-administered medications for this visit.      Allergies:   Oruvail [ketoprofen]; Relafen [nabumetone]; and Timentin [ticarcillin-pot clavulanate]   Social History:  The patient  reports that she has never smoked. She has never used smokeless tobacco. She reports that she does not drink alcohol or use drugs.  Family History:   family history includes Breast cancer in her paternal aunt; Cancer (age of onset: 58) in her father; Heart disease (age of onset: 47) in her mother.    Review of Systems: Review of Systems  Constitutional: Negative.   Respiratory: Negative.   Cardiovascular: Positive for chest pain.  Gastrointestinal: Negative.   Musculoskeletal: Negative.   Psychiatric/Behavioral: Negative.   All other systems reviewed and are negative.    PHYSICAL EXAM: VS:  BP 140/72 (BP Location: Left Arm, Patient Position: Sitting, Cuff Size: Normal)   Pulse (!) 56   Ht 5\' 7"  (1.702 m)   Wt 132 lb 4 oz (60 kg)   LMP 10/27/1989    BMI 20.71 kg/m  , BMI Body mass index is 20.71 kg/m. GEN: Thin, well developed, in no acute distress  HEENT: normal  Neck: no JVD, carotid bruits, or masses Cardiac: RRR; no murmurs, rubs, or gallops,no edema  Respiratory:  clear to auscultation bilaterally, normal work of breathing GI: soft, nontender, nondistended, + BS MS: no deformity or atrophy  Skin: warm and dry, no rash Neuro:  Strength and sensation are intact Psych: euthymic mood, full affect    Recent Labs: 09/24/2016: ALT 15 02/10/2017: BUN 15; Creatinine, Ser 1.01; Hemoglobin 14.3; Platelets 260.0; Potassium 4.6; Sodium 142; TSH 4.07    Lipid Panel Lab Results  Component Value Date   CHOL 173 09/24/2016   HDL 61.40 09/24/2016   LDLCALC 94 09/24/2016   TRIG 88.0 09/24/2016      Wt Readings from Last 3 Encounters:  02/21/17 132 lb 4 oz (60 kg)  02/10/17 130 lb 6.4 oz (59.1 kg)  12/03/16 132 lb 8 oz (60.1 kg)       ASSESSMENT AND PLAN:  Essential hypertension - Plan: EKG 12-Lead Blood pressure is well controlled on today's visit.  Will monitor for now, no medication changes made  Typical atrial flutter ([facility]) - Plan: EKG 12-Lead She stopped metoprolol and amiodarone for fatigue Denies any further symptoms of arrhythmia, tachycardia  Chronic fatigue - Plan: EKG 12-Lead Symptoms improved, more active Recommended exercise program  Chest pain Etiology unclear, atypical in nature Presenting at rest, not with exertion No events/symptoms in the past month even with exertion Long discussion with her concerning various treatment options She prefers further monitoring at this time without testing Declined stress testing Recommended she call our office if she has additional episodes  Encounter for anticoagulation discussion and counseling Tolerating anticoagulation   Total encounter time more than 25 minutes  Greater than 50% was spent in counseling and coordination of care with the  patient   Disposition:   F/U  6 months   Orders Placed This Encounter  Procedures  . EKG 12-Lead     Signed, Esmond Plants, M.D., Ph.D. 02/21/2017  [place], [place]

## 2017-02-21 ENCOUNTER — Ambulatory Visit (INDEPENDENT_AMBULATORY_CARE_PROVIDER_SITE_OTHER): Payer: Medicare Other | Admitting: Cardiovascular Disease

## 2017-02-21 ENCOUNTER — Encounter: Payer: Self-pay | Admitting: Cardiovascular Disease

## 2017-02-21 VITALS — BP 140/72 | HR 56 | Ht 67.0 in | Wt 132.2 lb

## 2017-02-21 DIAGNOSIS — I10 Essential (primary) hypertension: Secondary | ICD-10-CM

## 2017-02-21 DIAGNOSIS — Z7189 Other specified counseling: Secondary | ICD-10-CM

## 2017-02-21 DIAGNOSIS — I483 Typical atrial flutter: Secondary | ICD-10-CM | POA: Diagnosis not present

## 2017-02-21 DIAGNOSIS — R079 Chest pain, unspecified: Secondary | ICD-10-CM | POA: Diagnosis not present

## 2017-02-21 DIAGNOSIS — E7800 Pure hypercholesterolemia, unspecified: Secondary | ICD-10-CM | POA: Diagnosis not present

## 2017-02-21 DIAGNOSIS — R5382 Chronic fatigue, unspecified: Secondary | ICD-10-CM

## 2017-02-21 NOTE — Patient Instructions (Signed)

## 2017-03-23 ENCOUNTER — Other Ambulatory Visit: Payer: Self-pay | Admitting: Internal Medicine

## 2017-04-04 ENCOUNTER — Ambulatory Visit
Admission: RE | Admit: 2017-04-04 | Discharge: 2017-04-04 | Disposition: A | Discharge status: Home / Self Care | Payer: Medicare Other | Source: Ambulatory Visit | Attending: Internal Medicine | Admitting: Internal Medicine

## 2017-04-04 ENCOUNTER — Encounter: Payer: Self-pay | Admitting: Internal Medicine

## 2017-04-04 DIAGNOSIS — M85851 Other specified disorders of bone density and structure, right thigh: Secondary | ICD-10-CM | POA: Insufficient documentation

## 2017-04-04 DIAGNOSIS — E2839 Other primary ovarian failure: Secondary | ICD-10-CM | POA: Insufficient documentation

## 2017-04-04 DIAGNOSIS — Z780 Asymptomatic menopausal state: Secondary | ICD-10-CM | POA: Diagnosis not present

## 2017-04-04 DIAGNOSIS — Z13820 Encounter for screening for osteoporosis: Secondary | ICD-10-CM | POA: Insufficient documentation

## 2017-04-04 DIAGNOSIS — Z1231 Encounter for screening mammogram for malignant neoplasm of breast: Secondary | ICD-10-CM | POA: Diagnosis not present

## 2017-04-04 LAB — HM MAMMOGRAPHY

## 2017-04-07 ENCOUNTER — Ambulatory Visit (INDEPENDENT_AMBULATORY_CARE_PROVIDER_SITE_OTHER): Payer: Medicare Other | Admitting: Internal Medicine

## 2017-04-07 ENCOUNTER — Encounter: Payer: Self-pay | Admitting: Internal Medicine

## 2017-04-07 VITALS — BP 136/70 | HR 62 | Temp 98.6°F | Resp 12 | Ht 67.0 in | Wt 132.2 lb

## 2017-04-07 DIAGNOSIS — I10 Essential (primary) hypertension: Secondary | ICD-10-CM

## 2017-04-07 DIAGNOSIS — E7800 Pure hypercholesterolemia, unspecified: Secondary | ICD-10-CM | POA: Diagnosis not present

## 2017-04-07 DIAGNOSIS — R5383 Other fatigue: Secondary | ICD-10-CM | POA: Diagnosis not present

## 2017-04-07 DIAGNOSIS — D72819 Decreased white blood cell count, unspecified: Secondary | ICD-10-CM | POA: Diagnosis not present

## 2017-04-07 DIAGNOSIS — I483 Typical atrial flutter: Secondary | ICD-10-CM

## 2017-04-07 DIAGNOSIS — L299 Pruritus, unspecified: Secondary | ICD-10-CM | POA: Diagnosis not present

## 2017-04-07 DIAGNOSIS — F439 Reaction to severe stress, unspecified: Secondary | ICD-10-CM | POA: Diagnosis not present

## 2017-04-07 NOTE — Progress Notes (Signed)
Patient ID: Avagail Whittlesey, female   DOB: 08-17-1934, 83 y.o.   MRN: [id number]   Subjective:    Patient ID: [name], female    DOB: 1934-05-07, 83 y.o.   MRN: [id number]  HPI  Patient here for a scheduled follow up.  She reports her energy is better since being off amiodarone and metoprolol.  She reports she feels some fatigue during the day.  Is sleeping at night.  Does take a nap.  Handling stress.  Discussed if zoloft was contributing.  On low dose.  No chest pain.  No increased heart rate or palpitations.  No sob.  No acid reflux.  No abdominal pain.  Bowels moving.  Bone density - osteopenia.  Mammogram - Birads I.     Past Medical History:  Diagnosis Date  . Anemia   . Fibrocystic breast disease   . History of chicken pox   . Hypercholesterolemia   . Hypertension    Past Surgical History:  Procedure Laterality Date  . BREAST BIOPSY Right 2014   NEG  . Laser repair of torn retina     Family History  Problem Relation Age of Onset  . Heart disease Mother 52       Heart attack  . Cancer Father 58       lung cancer  . Breast cancer Paternal Aunt        x3  . Colon cancer Neg Hx    Social History   Social History  . Marital status: Widowed    Spouse name: N/A  . Number of children: 2  . Years of education: N/A   Social History Main Topics  . Smoking status: Never Smoker  . Smokeless tobacco: Never Used  . Alcohol use No  . Drug use: No  . Sexual activity: Not Asked   Other Topics Concern  . None   Social History Narrative  . None    Outpatient Encounter Prescriptions as of 04/07/2017  Medication Sig  . apixaban (ELIQUIS) 5 MG TABS tablet Take 1 tablet (5 mg total) by mouth 2 (two) times daily.  . Ascorbic Acid (VITAMIN C) 500 MG CAPS Take 500 mg by mouth daily.  . Calcium-Magnesium-Vitamin D (CALCIUM 500 PO) Take by mouth daily.  . fish oil-omega-3 fatty acids 1000 MG capsule Take 1 g by mouth daily.   . Flaxseed, Linseed, (FLAX SEEDS PO) Take by mouth daily.    . irbesartan (AVAPRO) 150 MG tablet   . Multiple Vitamin (MULTIVITAMIN) tablet Take 1 tablet by mouth daily.  . ranitidine (ZANTAC) 150 MG capsule Take 150 mg by mouth 2 (two) times daily.  . Selenium (SELENICAPS-200 PO) Take 200 mg by mouth daily.  . sertraline (ZOLOFT) 50 MG tablet TAKE 1 TABLET BY MOUTH ONCE DAILY (Patient taking differently: TAKE 1/2 TABLET BY MOUTH ONCE DAILY)   No facility-administered encounter medications on file as of 04/07/2017.     Review of Systems  Constitutional: Positive for fatigue. Negative for appetite change and unexpected weight change.  HENT: Negative for congestion and sinus pressure.   Respiratory: Negative for cough, chest tightness and shortness of breath.   Cardiovascular: Negative for chest pain, palpitations and leg swelling.  Gastrointestinal: Negative for abdominal pain, diarrhea, nausea and vomiting.  Genitourinary: Negative for difficulty urinating and dysuria.  Musculoskeletal: Negative for back pain and joint swelling.  Skin: Negative for color change and rash.  Neurological: Negative for dizziness, light-headedness and headaches.  Psychiatric/Behavioral: Negative for agitation and dysphoric mood.  Objective:    Physical Exam  Constitutional: She appears well-developed and well-nourished. No distress.  HENT:  Nose: Nose normal.  Mouth/Throat: Oropharynx is clear and moist.  Neck: Neck supple. No thyromegaly present.  Cardiovascular: Normal rate and regular rhythm.   Pulmonary/Chest: Breath sounds normal. No respiratory distress. She has no wheezes.  Abdominal: Soft. Bowel sounds are normal. There is no tenderness.  Musculoskeletal: She exhibits no edema or tenderness.  Lymphadenopathy:    She has no cervical adenopathy.  Skin: No rash noted. No erythema.  Psychiatric: She has a normal mood and affect. Her behavior is normal.    BP 136/70 (BP Location: Left Arm, Patient Position: Sitting, Cuff Size: Normal)   Pulse 62    Temp 98.6 F (37 C) (Oral)   Resp 12   Ht 5\' 7"  (1.702 m)   Wt 132 lb 3.2 oz (60 kg)   LMP 10/27/1989   SpO2 96%   BMI 20.71 kg/m  Wt Readings from Last 3 Encounters:  04/07/17 132 lb 3.2 oz (60 kg)  02/21/17 132 lb 4 oz (60 kg)  02/10/17 130 lb 6.4 oz (59.1 kg)     Lab Results  Component Value Date   WBC 5.2 02/10/2017   HGB 14.3 02/10/2017   HCT 42.8 02/10/2017   PLT 260.0 02/10/2017   GLUCOSE 96 02/10/2017   CHOL 173 09/24/2016   TRIG 88.0 09/24/2016   HDL 61.40 09/24/2016   LDLDIRECT 117.7 11/06/2013   LDLCALC 94 09/24/2016   ALT 15 09/24/2016   AST 17 09/24/2016   NA 142 02/10/2017   K 4.6 02/10/2017   CL 105 02/10/2017   CREATININE 1.01 02/10/2017   BUN 15 02/10/2017   CO2 30 02/10/2017   TSH 4.07 02/10/2017    Dg Bone Density  Result Date: 04/04/2017 EXAM: DUAL X-RAY ABSORPTIOMETRY (DXA) FOR BONE MINERAL DENSITY IMPRESSION: Dear Dr. Einar Pheasant, Your patient Dailynn Nancarrow completed a FRAX assessment on 04/04/2017 using the [place] (analysis version: 14.10) manufactured by [employer]. The following summarizes the results of our evaluation. PATIENT BIOGRAPHICAL: Name: Autum, Benfer Patient ID: [id number] Birth Date: 1934-05-02 Height:    66.5 in. Gender:     Female    [age]       Weight:    132.3 lbs. Ethnicity:  White                            Exam Date: 04/04/2017 FRAX* RESULTS:  (version: 3.5) 10-year Probability of Fracture1 Major Osteoporotic Fracture2 Hip Fracture 17.0% 4.0% Population: [place] (Caucasian) Risk Factors: History of Fracture (Adult) Based on Femur (Right) Neck BMD 1 -The 10-year probability of fracture may be lower than reported if the patient has received treatment. 2 -Major Osteoporotic Fracture: Clinical Spine, Forearm, Hip or Shoulder *FRAX is a Materials engineer of the [employer] for Metabolic Bone Disease, a [place] (WHO) [facility]. ASSESSMENT: The probability of  a major osteoporotic fracture is 17.0% within the next ten years. The probability of a hip fracture is 4.0% within the next ten years. . Dear Dr. Einar Pheasant, Your patient Missi Mcmackin completed a BMD test on 04/04/2017 using the [place] (analysis version: 14.10) manufactured by [employer]. The following summarizes the results of our evaluation. PATIENT BIOGRAPHICAL: Name: Teiara, Baria Patient ID: [id number] Birth Date: [birth year] Height: 66.5 in. Gender: Female Exam Date:  04/04/2017 Weight: 132.3 lbs. Indications: Advanced Age, Caucasian, History of Fracture (Adult), Postmenopausal Fractures: Left wrist Treatments: Calcium, Vitamin D ASSESSMENT: The BMD measured at Femur Total Right is 0.805 g/cm2 with a T-score of -1.6. This patient is considered osteopenic according to [facility]) criteria. L3-L4 were excluded due to degenerative changes. Site Region Measured Measured WHO Young Adult BMD Date       Age      Classification T-score AP Spine L1-L2 04/04/2017 [age] Normal 0.8 1.276 g/cm2 DualFemur Total Right 04/04/2017 [age] Osteopenia -1.6 0.805 g/cm2 World Health Organization [facility]) criteria for post-menopausal, Caucasian Women: Normal:       T-score at or above -1 SD Osteopenia:   T-score between -1 and -2.5 SD Osteoporosis: T-score at or below -2.5 SD RECOMMENDATIONS: [place] recommends that FDA-approved medical therapies be considered in postmenopausal women and men age 34 or older with a: 1. Hip or vertebral (clinical or morphometric) fracture. 2. T-score of < -2.5 at the spine or hip. 3. Ten-year fracture probability by FRAX of 3% or greater for hip fracture or 20% or greater for major osteoporotic fracture. All treatment decisions require clinical judgment and consideration of individual patient factors, including patient preferences, co-morbidities, previous drug use, risk factors not captured in the FRAX model (e.g. falls, vitamin D deficiency,  increased bone turnover, interval significant decline in bone density) and possible under - or over-estimation of fracture risk by FRAX. All patients should ensure an adequate intake of dietary calcium (1200 mg/d) and vitamin D (800 IU daily) unless contraindicated. FOLLOW-UP: People with diagnosed cases of osteoporosis or at high risk for fracture should have regular bone mineral density tests. For patients eligible for Medicare, routine testing is allowed once every 2 years. The testing frequency can be increased to one year for patients who have rapidly progressing disease, those who are receiving or discontinuing medical therapy to restore bone mass, or have additional risk factors. I have reviewed this report, and agree with the above findings. [facility] Radiology Electronically Signed   By: Lowella Grip III M.D.   On: 04/04/2017 11:24   Mm Screening Breast Tomo Bilateral  Result Date: 04/04/2017 CLINICAL DATA:  Screening. EXAM: 2D DIGITAL SCREENING BILATERAL MAMMOGRAM WITH CAD AND ADJUNCT TOMO COMPARISON:  Previous exam(s). ACR Breast Density Category c: The breast tissue is heterogeneously dense, which may obscure small masses. FINDINGS: There are no findings suspicious for malignancy. Images were processed with CAD. IMPRESSION: No mammographic evidence of malignancy. A result letter of this screening mammogram will be mailed directly to the patient. RECOMMENDATION: Screening mammogram in one year. (Code:SM-B-01Y) BI-RADS CATEGORY  1: Negative. Electronically Signed   By: Pamelia Hoit M.D.   On: 04/04/2017 18:40       Assessment & Plan:   Problem List Items Addressed This Visit    Atrial flutter ([place])    Appears to be in SR.  Off amiodarone and metoprolol.  Seeing cardiology.  Remains on eliquis.        Relevant Medications   irbesartan (AVAPRO) 150 MG tablet   Essential hypertension    Blood pressure on outside checks under good control.  Same medication regimen.  Follow pressures.   Follow metabolic panel.        Relevant Medications   irbesartan (AVAPRO) 150 MG tablet   Fatigue    Off amiodarone and metoprolol.  Energy is better.  Still with fatigue.  Naps.  Will decrease zoloft to 25mg  qod.  Follow.  Hypercholesterolemia    Follow lipid panel.        Relevant Medications   irbesartan (AVAPRO) 150 MG tablet   Leukopenia    Most recent check wnl.        Stress    Doing well.  Will decrease zoloft to 25mg  qod.  See if helps with fatigue.  Follow.         Other Visit Diagnoses    Itchy scalp    -  Primary   Have her use selsun shampoo.  notify me if persistent.         Einar Pheasant, MD

## 2017-04-07 NOTE — Progress Notes (Signed)
Pre-visit discussion using our clinic review tool. No additional management support is needed unless otherwise documented below in the visit note.  

## 2017-04-09 ENCOUNTER — Encounter: Payer: Self-pay | Admitting: Internal Medicine

## 2017-04-09 NOTE — Assessment & Plan Note (Signed)
Doing well.  Will decrease zoloft to 25mg  qod.  See if helps with fatigue.  Follow.

## 2017-04-09 NOTE — Assessment & Plan Note (Signed)
Appears to be in SR.  Off amiodarone and metoprolol.  Seeing cardiology.  Remains on eliquis.

## 2017-04-09 NOTE — Assessment & Plan Note (Signed)
Follow lipid panel.   

## 2017-04-09 NOTE — Assessment & Plan Note (Signed)
Off amiodarone and metoprolol.  Energy is better.  Still with fatigue.  Naps.  Will decrease zoloft to 25mg  qod.  Follow.

## 2017-04-09 NOTE — Assessment & Plan Note (Signed)
Blood pressure on outside checks under good control.  Same medication regimen.  Follow pressures.  Follow metabolic panel.

## 2017-04-09 NOTE — Assessment & Plan Note (Signed)
Most recent check wnl.

## 2017-04-19 ENCOUNTER — Other Ambulatory Visit: Payer: Self-pay | Admitting: Internal Medicine

## 2017-06-08 ENCOUNTER — Other Ambulatory Visit: Payer: Self-pay | Admitting: Cardiovascular Disease

## 2017-06-10 ENCOUNTER — Encounter: Payer: Self-pay | Admitting: Internal Medicine

## 2017-06-10 ENCOUNTER — Ambulatory Visit (INDEPENDENT_AMBULATORY_CARE_PROVIDER_SITE_OTHER): Payer: Medicare Other | Admitting: Internal Medicine

## 2017-06-10 VITALS — BP 136/68 | HR 65 | Temp 98.0°F | Resp 14 | Ht 67.0 in | Wt 133.4 lb

## 2017-06-10 DIAGNOSIS — I483 Typical atrial flutter: Secondary | ICD-10-CM

## 2017-06-10 DIAGNOSIS — I10 Essential (primary) hypertension: Secondary | ICD-10-CM | POA: Diagnosis not present

## 2017-06-10 DIAGNOSIS — E7800 Pure hypercholesterolemia, unspecified: Secondary | ICD-10-CM | POA: Diagnosis not present

## 2017-06-10 DIAGNOSIS — R945 Abnormal results of liver function studies: Secondary | ICD-10-CM

## 2017-06-10 DIAGNOSIS — R059 Cough, unspecified: Secondary | ICD-10-CM

## 2017-06-10 DIAGNOSIS — Z23 Encounter for immunization: Secondary | ICD-10-CM | POA: Diagnosis not present

## 2017-06-10 DIAGNOSIS — L659 Nonscarring hair loss, unspecified: Secondary | ICD-10-CM

## 2017-06-10 DIAGNOSIS — F439 Reaction to severe stress, unspecified: Secondary | ICD-10-CM

## 2017-06-10 DIAGNOSIS — R05 Cough: Secondary | ICD-10-CM

## 2017-06-10 DIAGNOSIS — R7989 Other specified abnormal findings of blood chemistry: Secondary | ICD-10-CM

## 2017-06-10 LAB — COMPREHENSIVE METABOLIC PANEL
ALT: 14 U/L (ref 0–35)
AST: 18 U/L (ref 0–37)
Albumin: 4.2 g/dL (ref 3.5–5.2)
Alkaline Phosphatase: 58 U/L (ref 39–117)
BUN: 15 mg/dL (ref 6–23)
CO2: 31 mEq/L (ref 19–32)
Calcium: 10.4 mg/dL (ref 8.4–10.5)
Chloride: 103 mEq/L (ref 96–112)
Creatinine, Ser: 0.96 mg/dL (ref 0.40–1.20)
GFR: 59.25 mL/min — ABNORMAL LOW (ref >60.00)
Glucose, Bld: 96 mg/dL (ref 70–99)
Potassium: 4.7 mEq/L (ref 3.5–5.1)
Sodium: 141 mEq/L (ref 135–145)
Total Bilirubin: 0.7 mg/dL (ref 0.2–1.2)
Total Protein: 6.9 g/dL (ref 6.0–8.3)

## 2017-06-10 LAB — LIPID PANEL
Cholesterol: 210 mg/dL — ABNORMAL HIGH (ref 0–200)
HDL: 72.8 mg/dL (ref >39.00)
LDL Cholesterol: 102 mg/dL — ABNORMAL HIGH (ref 0–99)
NonHDL: 136.87
Total CHOL/HDL Ratio: 3
Triglycerides: 173 mg/dL — ABNORMAL HIGH (ref 0.0–149.0)
VLDL: 34.6 mg/dL (ref 0.0–40.0)

## 2017-06-10 LAB — TSH: TSH: 4.16 u[IU]/mL (ref 0.35–4.50)

## 2017-06-10 MED ORDER — FAMOTIDINE 20 MG PO TABS: 20.0000 mg | ORAL_TABLET | Freq: Every day | ORAL | 2 refills | Status: DC

## 2017-06-10 NOTE — Progress Notes (Signed)
Patient ID: Anita Benjamin, female   DOB: 1933-10-15, 83 y.o.   MRN: [id number]   Subjective:    Patient ID: Anita Benjamin, female    DOB: [birth year], 83 y.o.   MRN: [id number]  HPI  Patient here for a scheduled follow up.  States she is doing relatively well.  Energy is better.  No headache.   No sore throat.  Does report she stopped zantac secondary to problems swallowing the pills.  States she only has problems with this pill, so she stopped the medication.  She does report noticing when she eats, she coughs.  She reports having itchy scalp and hair loss.  Persistent problems.  Discussed referral to dermatology.  She also request a skin check as well.  No chest pain.  No sob.  No increased heart rate or palpitations.  No abdominal pain.  Bowels moving.  States blood pressure is averaging 117-120 and 134/70s.     Past Medical History:  Diagnosis Date  . Anemia   . Fibrocystic breast disease   . History of chicken pox   . Hypercholesterolemia   . Hypertension    Past Surgical History:  Procedure Laterality Date  . BREAST BIOPSY Right 2014   NEG  . Laser repair of torn retina     Family History  Problem Relation Age of Onset  . Heart disease Mother 75       Heart attack  . Cancer Father 69       lung cancer  . Breast cancer Paternal Aunt        x3  . Colon cancer Neg Hx    Social History   Social History  . Marital status: Widowed    Spouse name: N/A  . Number of children: 2  . Years of education: N/A   Social History Main Topics  . Smoking status: Never Smoker  . Smokeless tobacco: Never Used  . Alcohol use No  . Drug use: No  . Sexual activity: Not Asked   Other Topics Concern  . None   Social History Narrative  . None    Outpatient Encounter Prescriptions as of 06/10/2017  Medication Sig  . Ascorbic Acid (VITAMIN C) 500 MG CAPS Take 500 mg by mouth daily.  . Calcium-Magnesium-Vitamin D (CALCIUM 500 PO) Take by mouth daily.  Mar[place] Kitchen ELIQUIS 5 MG TABS tablet TAKE 1 TABLET  BY MOUTH TWICE DAILY  . fish oil-omega-3 fatty acids 1000 MG capsule Take 1 g by mouth daily.   . Flaxseed, Linseed, (FLAX SEEDS PO) Take by mouth daily.   . irbesartan (AVAPRO) 150 MG tablet   . irbesartan (AVAPRO) 150 MG tablet TAKE 1 TABLET BY MOUTH ONCE DAILY  . Multiple Vitamin (MULTIVITAMIN) tablet Take 1 tablet by mouth daily.  . Selenium (SELENICAPS-200 PO) Take 200 mg by mouth daily.  . sertraline (ZOLOFT) 50 MG tablet TAKE 1 TABLET BY MOUTH ONCE DAILY (Patient taking differently: TAKE 1/2 TABLET BY MOUTH ONCE DAILY)  . [DISCONTINUED] ranitidine (ZANTAC) 150 MG capsule Take 150 mg by mouth 2 (two) times daily.  . famotidine (PEPCID) 20 MG tablet Take 1 tablet (20 mg total) by mouth daily.   No facility-administered encounter medications on file as of 06/10/2017.     Review of Systems  Constitutional: Negative for appetite change and unexpected weight change.  HENT: Negative for congestion and sinus pressure.   Respiratory: Negative for chest tightness and shortness of breath.        When she eats, she  notices some cough.    Cardiovascular: Negative for chest pain, palpitations and leg swelling.  Gastrointestinal: Negative for abdominal pain, diarrhea, nausea and vomiting.  Genitourinary: Negative for difficulty urinating and dysuria.  Musculoskeletal: Negative for back pain and joint swelling.  Skin: Negative for color change and rash.  Neurological: Negative for dizziness, light-headedness and headaches.  Psychiatric/Behavioral: Negative for agitation and dysphoric mood.       Objective:      Physical Exam  Constitutional: She appears well-developed and well-nourished. No distress.  HENT:  Nose: Nose normal.  Mouth/Throat: Oropharynx is clear and moist.  Neck: Neck supple. No thyromegaly present.  Cardiovascular: Normal rate and regular rhythm.   Pulmonary/Chest: Breath sounds normal. No respiratory distress. She has no wheezes.  Abdominal: Soft. Bowel sounds are  normal. There is no tenderness.  Musculoskeletal: She exhibits no edema or tenderness.  Lymphadenopathy:    She has no cervical adenopathy.  Skin: No rash noted. No erythema.  Psychiatric: She has a normal mood and affect. Her behavior is normal.    BP 136/68 (BP Location: Left Arm, Patient Position: Sitting, Cuff Size: Normal)   Pulse 65   Temp 98 F (36.7 C) (Oral)   Resp 14   Ht 5\' 7"  (1.702 m)   Wt 133 lb 6.4 oz (60.5 kg)   LMP 10/27/1989   SpO2 97%   BMI 20.89 kg/m  Wt Readings from Last 3 Encounters:  06/10/17 133 lb 6.4 oz (60.5 kg)  04/07/17 132 lb 3.2 oz (60 kg)  02/21/17 132 lb 4 oz (60 kg)     Lab Results  Component Value Date   WBC 5.2 02/10/2017   HGB 14.3 02/10/2017   HCT 42.8 02/10/2017   PLT 260.0 02/10/2017   GLUCOSE 96 06/10/2017   CHOL 210 (H) 06/10/2017   TRIG 173.0 (H) 06/10/2017   HDL 72.80 06/10/2017   LDLDIRECT 117.7 11/06/2013   LDLCALC 102 (H) 06/10/2017   ALT 14 06/10/2017   AST 18 06/10/2017   NA 141 06/10/2017   K 4.7 06/10/2017   CL 103 06/10/2017   CREATININE 0.96 06/10/2017   BUN 15 06/10/2017   CO2 31 06/10/2017   TSH 4.16 06/10/2017    Dg Bone Density  Result Date: 04/04/2017 EXAM: DUAL X-RAY ABSORPTIOMETRY (DXA) FOR BONE MINERAL DENSITY IMPRESSION: Dear Dr. Einar Benjamin, Your patient Anita Benjamin completed a FRAX assessment on 04/04/2017 using the [place] (analysis version: 14.10) manufactured by [employer]. The following summarizes the results of our evaluation. PATIENT BIOGRAPHICAL: Name: Anita Benjamin Patient ID: [id number] Birth Date: 04/18/1934 Height:    66.5 in. Gender:     Female    [age]       Weight:    132.3 lbs. Ethnicity:  White                            Exam Date: 04/04/2017 FRAX* RESULTS:  (version: 3.5) 10-year Probability of Fracture1 Major Osteoporotic Fracture2 Hip Fracture 17.0% 4.0% Population: [place] (Caucasian) Risk Factors: History of Fracture (Adult) Based on Femur (Right) Neck BMD 1  -The 10-year probability of fracture may be lower than reported if the patient has received treatment. 2 -Major Osteoporotic Fracture: Clinical Spine, Forearm, Hip or Shoulder *FRAX is a Materials engineer of the [employer] for Metabolic Bone Disease, a [place] (WHO) [facility]. ASSESSMENT: The probability of a  major osteoporotic fracture is 17.0% within the next ten years. The probability of a hip fracture is 4.0% within the next ten years. . Dear Dr. Einar Benjamin, Your patient Channa Hazelett completed a BMD test on 04/04/2017 using the [place] (analysis version: 14.10) manufactured by [employer]. The following summarizes the results of our evaluation. PATIENT BIOGRAPHICAL: Name: Roseana, Rhine Patient ID: [id number] Birth Date: 09-08-1934 Height: 66.5 in. Gender: Female Exam Date: 04/04/2017 Weight: 132.3 lbs. Indications: Advanced Age, Caucasian, History of Fracture (Adult), Postmenopausal Fractures: Left wrist Treatments: Calcium, Vitamin D ASSESSMENT: The BMD measured at Femur Total Right is 0.805 g/cm2 with a T-score of -1.6. This patient is considered osteopenic according to [facility]) criteria. L3-L4 were excluded due to degenerative changes. Site Region Measured Measured WHO Young Adult BMD Date       Age      Classification T-score AP Spine L1-L2 04/04/2017 83.0 Normal 0.8 1.276 g/cm2 DualFemur Total Right 04/04/2017 83.0 Osteopenia -1.6 0.805 g/cm2 World Health Organization [facility]) criteria for post-menopausal, Caucasian Women: Normal:       T-score at or above -1 SD Osteopenia:   T-score between -1 and -2.5 SD Osteoporosis: T-score at or below -2.5 SD RECOMMENDATIONS: [place] recommends that FDA-approved medical therapies be considered in postmenopausal women and men age 60 or older with a: 1. Hip or vertebral (clinical or morphometric) fracture. 2. T-score of < -2.5 at the spine or hip. 3.  Ten-year fracture probability by FRAX of 3% or greater for hip fracture or 20% or greater for major osteoporotic fracture. All treatment decisions require clinical judgment and consideration of individual patient factors, including patient preferences, co-morbidities, previous drug use, risk factors not captured in the FRAX model (e.g. falls, vitamin D deficiency, increased bone turnover, interval significant decline in bone density) and possible under - or over-estimation of fracture risk by FRAX. All patients should ensure an adequate intake of dietary calcium (1200 mg/d) and vitamin D (800 IU daily) unless contraindicated. FOLLOW-UP: People with diagnosed cases of osteoporosis or at high risk for fracture should have regular bone mineral density tests. For patients eligible for Medicare, routine testing is allowed once every 2 years. The testing frequency can be increased to one year for patients who have rapidly progressing disease, those who are receiving or discontinuing medical therapy to restore bone mass, or have additional risk factors. I have reviewed this report, and agree with the above findings. [facility] Radiology Electronically Signed   By: Lowella Grip III M.D.   On: 04/04/2017 11:24   Mm Screening Breast Tomo Bilateral  Result Date: 04/04/2017 CLINICAL DATA:  Screening. EXAM: 2D DIGITAL SCREENING BILATERAL MAMMOGRAM WITH CAD AND ADJUNCT TOMO COMPARISON:  Previous exam(s). ACR Breast Density Category c: The breast tissue is heterogeneously dense, which may obscure small masses. FINDINGS: There are no findings suspicious for malignancy. Images were processed with CAD. IMPRESSION: No mammographic evidence of malignancy. A result letter of this screening mammogram will be mailed directly to the patient. RECOMMENDATION: Screening mammogram in one year. (Code:SM-B-01Y) BI-RADS CATEGORY  1: Negative. Electronically Signed   By: Pamelia Hoit M.D.   On: 04/04/2017 18:40       Assessment & Plan:     Problem List Items Addressed This Visit    Abnormal liver function test    Follow liver function tests.        Atrial flutter ([facility])    In SR.  Off amiodarone and metoprolol.  Followed by  cardiology.  Remains on eliquis.  Was concerned eliquis contributing to her itchy scalp.  Continue eliquis.  Will have dermatology evaluate.        Cough    Notices some cough when eats.  Off zantac as outlined.  Start pepcid.  Follow.        Essential hypertension    Blood pressure as outlined.  Have her to continue to spot check her pressure.  Same medication regimen.  Follow metabolic panel.        Relevant Orders   Comprehensive metabolic panel (Completed)   Hair loss    Describes concern regarding persistent hair loss and itchy scalp.  Will have dermatology evaluated.  Did not respond to selsun shampoo.        Relevant Orders   TSH (Completed)   Ambulatory referral to Dermatology   Hypercholesterolemia - Primary    Follow lipid panel.        Relevant Orders   Lipid panel (Completed)   Stress    On zoloft.  Doing well.  Follow.         Other Visit Diagnoses    Encounter for immunization       Relevant Medications   famotidine (PEPCID) 20 MG tablet   Other Relevant Orders   Flu vaccine HIGH DOSE PF (Completed)   Lipid panel (Completed)   Comprehensive metabolic panel (Completed)   TSH (Completed)       Anita Pheasant, MD

## 2017-06-12 ENCOUNTER — Encounter: Payer: Self-pay | Admitting: Internal Medicine

## 2017-06-12 NOTE — Assessment & Plan Note (Signed)
On zoloft.  Doing well.  Follow.  

## 2017-06-12 NOTE — Assessment & Plan Note (Signed)
Follow lipid panel.   

## 2017-06-12 NOTE — Assessment & Plan Note (Signed)
Follow liver function tests.   

## 2017-06-12 NOTE — Assessment & Plan Note (Signed)
Describes concern regarding persistent hair loss and itchy scalp.  Will have dermatology evaluated.  Did not respond to selsun shampoo.

## 2017-06-12 NOTE — Assessment & Plan Note (Signed)
In SR.  Off amiodarone and metoprolol.  Followed by cardiology.  Remains on eliquis.  Was concerned eliquis contributing to her itchy scalp.  Continue eliquis.  Will have dermatology evaluate.

## 2017-06-12 NOTE — Assessment & Plan Note (Signed)
Notices some cough when eats.  Off zantac as outlined.  Start pepcid.  Follow.

## 2017-06-12 NOTE — Assessment & Plan Note (Signed)
Blood pressure as outlined.  Have her to continue to spot check her pressure.  Same medication regimen.  Follow metabolic panel.

## 2017-07-08 ENCOUNTER — Other Ambulatory Visit: Payer: Self-pay | Admitting: Internal Medicine

## 2017-07-13 ENCOUNTER — Other Ambulatory Visit: Payer: Self-pay | Admitting: Internal Medicine

## 2017-07-20 ENCOUNTER — Other Ambulatory Visit: Payer: Self-pay | Admitting: Internal Medicine

## 2017-08-13 ENCOUNTER — Other Ambulatory Visit: Payer: Self-pay | Admitting: Internal Medicine

## 2017-10-06 ENCOUNTER — Ambulatory Visit: Payer: Medicare Other | Admitting: Internal Medicine

## 2017-10-06 ENCOUNTER — Encounter: Payer: Self-pay | Admitting: Internal Medicine

## 2017-10-06 DIAGNOSIS — I483 Typical atrial flutter: Secondary | ICD-10-CM

## 2017-10-06 DIAGNOSIS — F439 Reaction to severe stress, unspecified: Secondary | ICD-10-CM | POA: Diagnosis not present

## 2017-10-06 DIAGNOSIS — Z860100 Personal history of colon polyps, unspecified: Secondary | ICD-10-CM

## 2017-10-06 DIAGNOSIS — E7800 Pure hypercholesterolemia, unspecified: Secondary | ICD-10-CM

## 2017-10-06 DIAGNOSIS — I10 Essential (primary) hypertension: Secondary | ICD-10-CM | POA: Diagnosis not present

## 2017-10-06 DIAGNOSIS — D72819 Decreased white blood cell count, unspecified: Secondary | ICD-10-CM

## 2017-10-06 DIAGNOSIS — Z23 Encounter for immunization: Secondary | ICD-10-CM | POA: Diagnosis not present

## 2017-10-06 DIAGNOSIS — Z0000 Encounter for general adult medical examination without abnormal findings: Secondary | ICD-10-CM

## 2017-10-06 NOTE — Progress Notes (Signed)
Pre visit review using our clinic review tool, if applicable. No additional management support is needed unless otherwise documented below in the visit note. 

## 2017-10-06 NOTE — Progress Notes (Signed)
Patient ID: Anita Benjamin, female   DOB: [birth year], 84 y.o.   MRN: [id number]   Subjective:    Patient ID: Anita Benjamin, female    DOB: 1933-12-18, 84 y.o.   MRN: [id number]  HPI  Patient here for a scheduled follow up.  She reports she is doing relatively well.  Handling stress.  Has noticed some increased heart rate - a little more frequent.  May occur 2-3x/month.  May last several hours.  Takes metoprolol prn.  Resolves with metoprolol.  No chest pain.  No sob.  No acid reflux.  No abdominal pain.  Bowels moving.  Has noticed -  Some unsteady gait.  States is minimal, just notices a change.  Discussed w/up and evaluation.  Discussed physical therapy.  She declines.  Wants to monitor.  Blood pressures at home under good control.  Blood pressures averaging 130/80.     Past Medical History:  Diagnosis Date  . Anemia   . Fibrocystic breast disease   . History of chicken pox   . Hypercholesterolemia   . Hypertension    Past Surgical History:  Procedure Laterality Date  . BREAST BIOPSY Right 2014   NEG  . Laser repair of torn retina     Family History  Problem Relation Age of Onset  . Heart disease Mother 24       Heart attack  . Cancer Father 37       lung cancer  . Breast cancer Paternal Aunt        x3  . Colon cancer Neg Hx    Social History   Socioeconomic History  . Marital status: Widowed    Spouse name: None  . Number of children: 2  . Years of education: None  . Highest education level: None  Social Needs  . Financial resource strain: None  . Food insecurity - worry: None  . Food insecurity - inability: None  . Transportation needs - medical: None  . Transportation needs - non-medical: None  Occupational History  . None  Tobacco Use  . Smoking status: Never Smoker  . Smokeless tobacco: Never Used  Substance and Sexual Activity  . Alcohol use: No    Alcohol/week: 0.0 oz  . Drug use: No  . Sexual activity: None  Other Topics Concern  . None  Social History  Narrative  . None    Outpatient Encounter Medications as of 10/06/2017  Medication Sig  . Ascorbic Acid (VITAMIN C) 500 MG CAPS Take 500 mg by mouth daily.  . Calcium-Magnesium-Vitamin D (CALCIUM 500 PO) Take by mouth daily.  Marland Kitchen ELIQUIS 5 MG TABS tablet TAKE 1 TABLET BY MOUTH TWICE DAILY  . famotidine (PEPCID) 20 MG tablet TAKE 1 TABLET BY MOUTH ONCE EVERY MORNING 30 MIN. BEFORE BREAKFAST  . fish oil-omega-3 fatty acids 1000 MG capsule Take 1 g by mouth daily.   . Flaxseed, Linseed, (FLAX SEEDS PO) Take by mouth daily.   . irbesartan (AVAPRO) 150 MG tablet   . irbesartan (AVAPRO) 150 MG tablet TAKE 1 TABLET BY MOUTH ONCE DAILY  . Multiple Vitamin (MULTIVITAMIN) tablet Take 1 tablet by mouth daily.  . Selenium (SELENICAPS-200 PO) Take 200 mg by mouth daily.  . sertraline (ZOLOFT) 50 MG tablet TAKE 1 TABLET BY MOUTH ONCE DAILY  . sertraline (ZOLOFT) 50 MG tablet TAKE 1 TABLET BY MOUTH ONCE DAILY  . metoprolol succinate (TOPROL-XL) 25 MG 24 hr tablet Take 1 tablet (25 mg total) by mouth daily as needed.  No facility-administered encounter medications on file as of 10/06/2017.     Review of Systems  Constitutional: Negative for appetite change and unexpected weight change.  HENT: Negative for congestion and sinus pressure.   Eyes: Negative for pain and visual disturbance.  Respiratory: Negative for cough, chest tightness and shortness of breath.   Cardiovascular: Negative for chest pain and leg swelling.       Increased heart rate as outlined.  Intermittent episodes.    Gastrointestinal: Negative for abdominal pain, diarrhea, nausea and vomiting.  Genitourinary: Negative for difficulty urinating and dysuria.  Musculoskeletal: Negative for joint swelling and myalgias.  Skin: Negative for color change and rash.  Neurological: Negative for dizziness, light-headedness and headaches.  Hematological: Negative for adenopathy. Does not bruise/bleed easily.  Psychiatric/Behavioral: Negative for  agitation and dysphoric mood.       Objective:    Physical Exam  Constitutional: She is oriented to person, place, and time. She appears well-developed and well-nourished. No distress.  HENT:  Nose: Nose normal.  Mouth/Throat: Oropharynx is clear and moist.  Eyes: Right eye exhibits no discharge. Left eye exhibits no discharge. No scleral icterus.  Neck: Neck supple. No thyromegaly present.  Cardiovascular: Normal rate and regular rhythm.  Pulmonary/Chest: Breath sounds normal. No accessory muscle usage. No tachypnea. No respiratory distress. She has no decreased breath sounds. She has no wheezes. She has no rhonchi. Right breast exhibits no inverted nipple, no mass, no nipple discharge and no tenderness (no axillary adenopathy). Left breast exhibits no inverted nipple, no mass, no nipple discharge and no tenderness (no axilarry adenopathy).  Abdominal: Soft. Bowel sounds are normal. There is no tenderness.  Musculoskeletal: She exhibits no edema or tenderness.  Lymphadenopathy:    She has no cervical adenopathy.  Neurological: She is alert and oriented to person, place, and time.  Skin: Skin is warm. No rash noted. No erythema.  Psychiatric: She has a normal mood and affect. Her behavior is normal.    BP (!) 160/70   Pulse (!) 59   Temp 98.2 F (36.8 C) (Oral)   Ht 5\' 7"  (1.702 m)   Wt 133 lb 6.4 oz (60.5 kg)   LMP 10/27/1989   SpO2 96%   BMI 20.89 kg/m  Wt Readings from Last 3 Encounters:  10/06/17 133 lb 6.4 oz (60.5 kg)  06/10/17 133 lb 6.4 oz (60.5 kg)  04/07/17 132 lb 3.2 oz (60 kg)     Lab Results  Component Value Date   WBC 5.2 02/10/2017   HGB 14.3 02/10/2017   HCT 42.8 02/10/2017   PLT 260.0 02/10/2017   GLUCOSE 96 06/10/2017   CHOL 210 (H) 06/10/2017   TRIG 173.0 (H) 06/10/2017   HDL 72.80 06/10/2017   LDLDIRECT 117.7 11/06/2013   LDLCALC 102 (H) 06/10/2017   ALT 14 06/10/2017   AST 18 06/10/2017   NA 141 06/10/2017   K 4.7 06/10/2017   CL 103  06/10/2017   CREATININE 0.96 06/10/2017   BUN 15 06/10/2017   CO2 31 06/10/2017   TSH 4.16 06/10/2017    Dg Bone Density  Result Date: 04/04/2017 EXAM: DUAL X-RAY ABSORPTIOMETRY (DXA) FOR BONE MINERAL DENSITY IMPRESSION: Dear Dr. Einar Pheasant, Your patient Sky Primo completed a FRAX assessment on 04/04/2017 using the [place] (analysis version: 14.10) manufactured by [employer]. The following summarizes the results of our evaluation. PATIENT BIOGRAPHICAL: Name: Farra, Nikolic Patient ID: [id number] Birth Date: [birth year] Height:    66.5 in. Gender:  Female    [age]       Weight:    132.3 lbs. Ethnicity:  White                            Exam Date: 04/04/2017 FRAX* RESULTS:  (version: 3.5) 10-year Probability of Fracture1 Major Osteoporotic Fracture2 Hip Fracture 17.0% 4.0% Population: [place] (Caucasian) Risk Factors: History of Fracture (Adult) Based on Femur (Right) Neck BMD 1 -The 10-year probability of fracture may be lower than reported if the patient has received treatment. 2 -Major Osteoporotic Fracture: Clinical Spine, Forearm, Hip or Shoulder *FRAX is a Materials engineer of the [employer] for Metabolic Bone Disease, a [place] (WHO) [facility]. ASSESSMENT: The probability of a major osteoporotic fracture is 17.0% within the next ten years. The probability of a hip fracture is 4.0% within the next ten years. . Dear Dr. Einar Pheasant, Your patient Ravina Milner completed a BMD test on 04/04/2017 using the [place] (analysis version: 14.10) manufactured by [employer]. The following summarizes the results of our evaluation. PATIENT BIOGRAPHICAL: Name: Mylinh, Cragg Patient ID: [id number] Birth Date: 1933/11/19 Height: 66.5 in. Gender: Female Exam Date: 04/04/2017 Weight: 132.3 lbs. Indications: Advanced Age, Caucasian, History of Fracture (Adult), Postmenopausal Fractures: Left wrist Treatments: Calcium,  Vitamin D ASSESSMENT: The BMD measured at Femur Total Right is 0.805 g/cm2 with a T-score of -1.6. This patient is considered osteopenic according to [facility]) criteria. L3-L4 were excluded due to degenerative changes. Site Region Measured Measured WHO Young Adult BMD Date       Age      Classification T-score AP Spine L1-L2 04/04/2017 81.0 Normal 0.8 1.276 g/cm2 DualFemur Total Right 04/04/2017 81.0 Osteopenia -1.6 0.805 g/cm2 World Health Organization [facility]) criteria for post-menopausal, Caucasian Women: Normal:       T-score at or above -1 SD Osteopenia:   T-score between -1 and -2.5 SD Osteoporosis: T-score at or below -2.5 SD RECOMMENDATIONS: [employer] recommends that FDA-approved medical therapies be considered in postmenopausal women and men age 33 or older with a: 1. Hip or vertebral (clinical or morphometric) fracture. 2. T-score of < -2.5 at the spine or hip. 3. Ten-year fracture probability by FRAX of 3% or greater for hip fracture or 20% or greater for major osteoporotic fracture. All treatment decisions require clinical judgment and consideration of individual patient factors, including patient preferences, co-morbidities, previous drug use, risk factors not captured in the FRAX model (e.g. falls, vitamin D deficiency, increased bone turnover, interval significant decline in bone density) and possible under - or over-estimation of fracture risk by FRAX. All patients should ensure an adequate intake of dietary calcium (1200 mg/d) and vitamin D (800 IU daily) unless contraindicated. FOLLOW-UP: People with diagnosed cases of osteoporosis or at high risk for fracture should have regular bone mineral density tests. For patients eligible for Medicare, routine testing is allowed once every 2 years. The testing frequency can be increased to one year for patients who have rapidly progressing disease, those who are receiving or discontinuing medical therapy to restore  bone mass, or have additional risk factors. I have reviewed this report, and agree with the above findings. [facility] Electronically Signed   By: Lowella Grip III M.D.   On: 04/04/2017 11:24   Mm Screening Breast Tomo Bilateral  Result Date: 04/04/2017 CLINICAL DATA:  Screening. EXAM:  2D DIGITAL SCREENING BILATERAL MAMMOGRAM WITH CAD AND ADJUNCT TOMO COMPARISON:  Previous exam(s). ACR Breast Density Category c: The breast tissue is heterogeneously dense, which may obscure small masses. FINDINGS: There are no findings suspicious for malignancy. Images were processed with CAD. IMPRESSION: No mammographic evidence of malignancy. A result letter of this screening mammogram will be mailed directly to the patient. RECOMMENDATION: Screening mammogram in one year. (Code:SM-B-01Y) BI-RADS CATEGORY  1: Negative. Electronically Signed   By: Pamelia Hoit M.D.   On: 04/04/2017 18:40       Assessment & Plan:   Problem List Items Addressed This Visit    Atrial flutter ([place])    In SR.  Off amiodarone.  Only takes metoprolol prn.  Followed by cardiology.  Noticing some increased heart rate approximately 2-3x/month.  Discussed with her today.  Hesitant to take more medication.  Uses metoprolol prn.  Due f/u with cardiology.  Schedule.        Relevant Medications   metoprolol succinate (TOPROL-XL) 25 MG 24 hr tablet   Other Relevant Orders   Ambulatory referral to Cardiology   Essential hypertension    Blood pressure on recheck improved, but still elevated above goal.  Will continue same medication regimen.  Her outside checks are in an acceptable range.  Follow pressures.  F/u with cardiology as outlined.        Relevant Medications   metoprolol succinate (TOPROL-XL) 25 MG 24 hr tablet   Other Relevant Orders   Comprehensive metabolic panel   Health care maintenance    Had breast exam today.  PAP 10/2012 negative with negative HPV.  Colonoscopy 2006.  Saw GI.  cologuard negative.  Mammogram  04/04/17 - Birads I.        History of colonic polyps    Last colonoscopy 2006.  Saw GI.  cologuard negative.        Hypercholesterolemia    Follow lipid panel.       Relevant Medications   metoprolol succinate (TOPROL-XL) 25 MG 24 hr tablet   Other Relevant Orders   Comprehensive metabolic panel   Lipid panel   Leukopenia    Most recent cbc wnl.  Follow.       Relevant Orders   CBC with Differential/Platelet   Stress    On zoloft and doing well.  Follow.            Einar Pheasant, MD

## 2017-10-09 ENCOUNTER — Encounter: Payer: Self-pay | Admitting: Internal Medicine

## 2017-10-09 MED ORDER — METOPROLOL SUCCINATE ER 25 MG PO TB24: 25.0000 mg | ORAL_TABLET | Freq: Every day | ORAL | 1 refills | Status: DC | PRN

## 2017-10-09 NOTE — Assessment & Plan Note (Signed)
Most recent cbc wnl.  Follow.

## 2017-10-09 NOTE — Assessment & Plan Note (Signed)
Follow lipid panel.   

## 2017-10-09 NOTE — Assessment & Plan Note (Signed)
Last colonoscopy 2006.  Saw GI.  cologuard negative.

## 2017-10-09 NOTE — Assessment & Plan Note (Signed)
In SR.  Off amiodarone.  Only takes metoprolol prn.  Followed by cardiology.  Noticing some increased heart rate approximately 2-3x/month.  Discussed with her today.  Hesitant to take more medication.  Uses metoprolol prn.  Due f/u with cardiology.  Schedule.

## 2017-10-09 NOTE — Assessment & Plan Note (Signed)
Had breast exam today.  PAP 10/2012 negative with negative HPV.  Colonoscopy 2006.  Saw GI.  cologuard negative.  Mammogram 04/04/17 - Birads I.

## 2017-10-09 NOTE — Assessment & Plan Note (Signed)
On zoloft and doing well.  Follow.   

## 2017-10-09 NOTE — Assessment & Plan Note (Signed)
Blood pressure on recheck improved, but still elevated above goal.  Will continue same medication regimen.  Her outside checks are in an acceptable range.  Follow pressures.  F/u with cardiology as outlined.

## 2017-10-21 ENCOUNTER — Other Ambulatory Visit (INDEPENDENT_AMBULATORY_CARE_PROVIDER_SITE_OTHER): Payer: Medicare Other

## 2017-10-21 DIAGNOSIS — D72819 Decreased white blood cell count, unspecified: Secondary | ICD-10-CM

## 2017-10-21 DIAGNOSIS — I10 Essential (primary) hypertension: Secondary | ICD-10-CM | POA: Diagnosis not present

## 2017-10-21 DIAGNOSIS — E7800 Pure hypercholesterolemia, unspecified: Secondary | ICD-10-CM | POA: Diagnosis not present

## 2017-10-21 LAB — CBC WITH DIFFERENTIAL/PLATELET
Basophils Absolute: 0.1 10*3/uL (ref 0.0–0.1)
Basophils Relative: 1.1 % (ref 0.0–3.0)
Eosinophils Absolute: 0.3 10*3/uL (ref 0.0–0.7)
Eosinophils Relative: 5.3 % — ABNORMAL HIGH (ref 0.0–5.0)
HCT: 41.2 % (ref 36.0–46.0)
Hemoglobin: 14 g/dL (ref 12.0–15.0)
Lymphocytes Relative: 23.8 % (ref 12.0–46.0)
Lymphs Abs: 1.2 10*3/uL (ref 0.7–4.0)
MCHC: 34 g/dL (ref 30.0–36.0)
MCV: 93.8 fl (ref 78.0–100.0)
Monocytes Absolute: 0.4 10*3/uL (ref 0.1–1.0)
Monocytes Relative: 8.1 % (ref 3.0–12.0)
Neutro Abs: 3.1 10*3/uL (ref 1.4–7.7)
Neutrophils Relative %: 61.7 % (ref 43.0–77.0)
Platelets: 284 10*3/uL (ref 150.0–400.0)
RBC: 4.39 Mil/uL (ref 3.87–5.11)
RDW: 13.5 % (ref 11.5–15.5)
WBC: 5 10*3/uL (ref 4.0–10.5)

## 2017-10-21 LAB — COMPREHENSIVE METABOLIC PANEL
ALT: 12 U/L (ref 0–35)
AST: 16 U/L (ref 0–37)
Albumin: 4.2 g/dL (ref 3.5–5.2)
Alkaline Phosphatase: 55 U/L (ref 39–117)
BUN: 19 mg/dL (ref 6–23)
CO2: 32 mEq/L (ref 19–32)
Calcium: 9.6 mg/dL (ref 8.4–10.5)
Chloride: 103 mEq/L (ref 96–112)
Creatinine, Ser: 0.93 mg/dL (ref 0.40–1.20)
GFR: 61.4 mL/min (ref >60.00)
Glucose, Bld: 101 mg/dL — ABNORMAL HIGH (ref 70–99)
Potassium: 4.9 mEq/L (ref 3.5–5.1)
Sodium: 141 mEq/L (ref 135–145)
Total Bilirubin: 0.6 mg/dL (ref 0.2–1.2)
Total Protein: 6.9 g/dL (ref 6.0–8.3)

## 2017-10-21 LAB — LIPID PANEL
Cholesterol: 207 mg/dL — ABNORMAL HIGH (ref 0–200)
HDL: 67.1 mg/dL (ref >39.00)
LDL Cholesterol: 118 mg/dL — ABNORMAL HIGH (ref 0–99)
NonHDL: 139.66
Total CHOL/HDL Ratio: 3
Triglycerides: 108 mg/dL (ref 0.0–149.0)
VLDL: 21.6 mg/dL (ref 0.0–40.0)

## 2017-10-31 ENCOUNTER — Other Ambulatory Visit: Payer: Self-pay

## 2017-10-31 MED ORDER — ROSUVASTATIN CALCIUM 10 MG PO TABS: 10.0000 mg | ORAL_TABLET | Freq: Every day | ORAL | 0 refills | Status: DC

## 2017-11-12 ENCOUNTER — Other Ambulatory Visit: Payer: Self-pay | Admitting: Internal Medicine

## 2017-11-12 ENCOUNTER — Other Ambulatory Visit: Payer: Self-pay | Admitting: Cardiovascular Disease

## 2017-11-14 NOTE — Telephone Encounter (Signed)
Please review for refill, Thanks !  

## 2017-11-18 ENCOUNTER — Other Ambulatory Visit: Payer: Self-pay | Admitting: Internal Medicine

## 2017-11-21 ENCOUNTER — Ambulatory Visit: Payer: Medicare Other | Admitting: Cardiovascular Disease

## 2017-11-22 NOTE — Progress Notes (Signed)
Cardiology Office Note  Date:  11/24/2017   ID:  Anita Benjamin, DOB [birth year], MRN [id number]  PCP:  Anita Pheasant, MD   Chief Complaint  Patient presents with  . OTHER    Flutter ; Pt would like to discuss several medications and side effects. Meds reviewed verbally with pt.    HPI:  Anita Benjamin is a very pleasant 84 year old woman, with a history of  tachycardia, SVT episodes,  atrial flutter, hypotension. several year history, possibly even up to 10 years of tachyarrhythmia. She presents today for follow-up of her tachycardia and chest pain  In follow-up today she reports that she was having worsening tachycardia and palpitations Thinks it was the pepcid, stop the medication Changed back to zantac, symptoms have resolved  Previously stopped amiodarone and metoprolol She does have metoprolol on her list to take as needed but she has not been taking this  Active, no complaints of shortness of breath or chest pain Reports blood pressure well controlled at home  For some reason is on aspirin and fish oils in addition to her Eliquis  No further chest pain symptoms  episode of chest painOne month ago, end of April 2018 Chest pain, to throat, dizzy while driving, symptoms lasted 15 min Went away on its own,  EKG on today's visit shows normal sinus rhythm with rate 58 bpm, no significant ST or T-wave changes  Other past medical history reviewed 10/01/2016 she had atrial flutter rate 120 bpm  started on amiodarone, metoprolol, eliquis.  She converted back to normal sinus rhythm  Mother died at age 7, found down, etiology unclear  On prior office visit she had atrial flutter with ventricular rate 130 bpm. She is not had any recurrent symptoms as she feels very jittery when she is in atrial flutter  In terms of her family history, father had lung cancer, possible heart issues, died at age 55. Mother had a stroke, died at 40  In terms of her social history, she is not a  smoker with no smoking history, lives at home with her husband. Is retired Total cholesterol 211  PMH:   has a past medical history of Anemia, Fibrocystic breast disease, History of chicken pox, Hypercholesterolemia, and Hypertension.  PSH:    Past Surgical History:  Procedure Laterality Date  . BREAST BIOPSY Right 2014   NEG  . Laser repair of torn retina      Current Outpatient Medications  Medication Sig Dispense Refill  . Ascorbic Acid (VITAMIN C) 250 MG CHEW Chew by mouth daily.    Marland Kitchen aspirin 81 MG chewable tablet Chew by mouth daily.    Marland Kitchen ELIQUIS 5 MG TABS tablet TAKE 1 TABLET BY MOUTH TWICE DAILY 60 tablet 3  . famotidine (PEPCID) 20 MG tablet TAKE 1 TABLET BY MOUTH ONCE EVERY MORNING 30 MINUTES BEFORE BREAKFAST 30 tablet 2  . Flaxseed, Linseed, (FLAX SEEDS PO) Take by mouth daily.     . irbesartan (AVAPRO) 150 MG tablet TAKE 1 TABLET BY MOUTH ONCE DAILY 30 tablet 3  . metoprolol succinate (TOPROL-XL) 25 MG 24 hr tablet Take 1 tablet (25 mg total) by mouth daily as needed. 30 tablet 1  . Multiple Vitamin (MULTIVITAMIN) tablet Take 1 tablet by mouth daily.    . NON FORMULARY ViaActiv Takes 1 tablet by mouth daily.    . Omega-3 Fatty Acids (FISH OIL) 1200 MG CAPS Take by mouth daily.    . ranitidine (ZANTAC) 150 MG tablet Take 150 mg by  mouth at bedtime.    . Selenium (SELENICAPS-200 PO) Take 200 mg by mouth daily.    . sertraline (ZOLOFT) 50 MG tablet TAKE 1 TABLET BY MOUTH ONCE DAILY (Patient taking differently: TAKE 1/2 TABLET BY MOUTH ONCE DAILY) 30 tablet x   No current facility-administered medications for this visit.      Allergies:   Oruvail [ketoprofen]; Relafen [nabumetone]; and Timentin [ticarcillin-pot clavulanate]   Social History:  The patient  reports that  has never smoked. she has never used smokeless tobacco. She reports that she does not drink alcohol or use drugs.   Family History:   family history includes Breast cancer in her paternal aunt; Cancer (age  of onset: 12) in her father; Heart disease (age of onset: 71) in her mother.    Review of Systems: Review of Systems  Constitutional: Negative.   Respiratory: Negative.   Cardiovascular: Positive for palpitations.  Gastrointestinal: Negative.   Musculoskeletal: Negative.   Psychiatric/Behavioral: Negative.   All other systems reviewed and are negative.    PHYSICAL EXAM: VS:  BP 130/70 (BP Location: Left Arm, Patient Position: Sitting, Cuff Size: Normal)   Pulse (!) 58   Ht 5\' 7"  (1.702 m)   Wt 131 lb 12 oz (59.8 kg)   LMP 10/27/1989   BMI 20.63 kg/m  , BMI Body mass index is 20.63 kg/m. Constitutional:  oriented to person, place, and time. No distress.  HENT:  Head: Normocephalic and atraumatic.  Eyes:  no discharge. No scleral icterus.  Neck: Normal range of motion. Neck supple. No JVD present.  Cardiovascular: Normal rate, regular rhythm, normal heart sounds and intact distal pulses. Exam reveals no gallop and no friction rub. No edema No murmur heard. Pulmonary/Chest: Effort normal and breath sounds normal. No stridor. No respiratory distress.  no wheezes.  no rales.  no tenderness.  Abdominal: Soft.  no distension.  no tenderness.  Musculoskeletal: Normal range of motion.  no  tenderness or deformity.  Neurological:  normal muscle tone. Coordination normal. No atrophy Skin: Skin is warm and dry. No rash noted. not diaphoretic.  Psychiatric:  normal mood and affect. behavior is normal. Thought content normal.   Recent Labs: 06/10/2017: TSH 4.16 10/21/2017: ALT 12; BUN 19; Creatinine, Ser 0.93; Hemoglobin 14.0; Platelets 284.0; Potassium 4.9; Sodium 141    Lipid Panel Lab Results  Component Value Date   CHOL 207 (H) 10/21/2017   HDL 67.10 10/21/2017   LDLCALC 118 (H) 10/21/2017   TRIG 108.0 10/21/2017      Wt Readings from Last 3 Encounters:  11/24/17 131 lb 12 oz (59.8 kg)  10/06/17 133 lb 6.4 oz (60.5 kg)  06/10/17 133 lb 6.4 oz (60.5 kg)       ASSESSMENT  AND PLAN:  Essential hypertension - Plan: EKG 12-Lead Blood pressure is well controlled on today's visit. No changes made to the medications.  Typical atrial flutter ([place]) - Plan: EKG 12-Lead She previously stopped metoprolol and amiodarone for fatigue Recent symptoms of tachycardia and palpitations which she attributes to Pepcid Now back on Zantac, reports that she feels fine Recommend she take metoprolol as needed for breakthrough palpitations Call our office for worsening symptoms  Chronic fatigue - Plan: EKG 12-Lead Active, no complaints  Chest pain Previous symptoms of chest pain, none recently No ischemic workup ordered We will continue to monitor  Encounter for anticoagulation discussion and counseling Tolerating anticoagulation, Eliquis We will hold the aspirin and fish oils   Total encounter time more than  25 minutes  Greater than 50% was spent in counseling and coordination of care with the patient   Disposition:   F/U  12 months   Orders Placed This Encounter  Procedures  . EKG 12-Lead     Signed, Esmond Plants, M.D., Ph.D. 11/24/2017  [place], [place]

## 2017-11-24 ENCOUNTER — Encounter: Payer: Self-pay | Admitting: Cardiovascular Disease

## 2017-11-24 ENCOUNTER — Ambulatory Visit (INDEPENDENT_AMBULATORY_CARE_PROVIDER_SITE_OTHER): Payer: Medicare Other | Admitting: Cardiovascular Disease

## 2017-11-24 VITALS — BP 130/70 | HR 58 | Ht 67.0 in | Wt 131.8 lb

## 2017-11-24 DIAGNOSIS — I483 Typical atrial flutter: Secondary | ICD-10-CM

## 2017-11-24 DIAGNOSIS — I10 Essential (primary) hypertension: Secondary | ICD-10-CM | POA: Diagnosis not present

## 2017-11-24 DIAGNOSIS — R0602 Shortness of breath: Secondary | ICD-10-CM

## 2017-11-24 DIAGNOSIS — Z7189 Other specified counseling: Secondary | ICD-10-CM | POA: Diagnosis not present

## 2017-11-24 DIAGNOSIS — R079 Chest pain, unspecified: Secondary | ICD-10-CM | POA: Diagnosis not present

## 2017-11-24 DIAGNOSIS — E7800 Pure hypercholesterolemia, unspecified: Secondary | ICD-10-CM | POA: Diagnosis not present

## 2017-11-24 DIAGNOSIS — R5382 Chronic fatigue, unspecified: Secondary | ICD-10-CM | POA: Diagnosis not present

## 2017-11-24 NOTE — Patient Instructions (Addendum)
Medication Instructions:   Please hold the aspirin and omega 3 fish oil  Labwork:  No new labs needed  Testing/Procedures:  No further testing at this time  Follow-Up: It was a pleasure seeing you in the office today. Please call us if you have new issues that need to be addressed before your next appt.  [phone]  Your physician wants you to follow-up in: 12 months.  You will receive a reminder letter in the mail two months in advance. If you don't receive a letter, please call our office to schedule the follow-up appointment.  If you need a refill on your cardiac medications before your next appointment, please call your pharmacy.  For educational health videos Log in to : [email or web address] Or : [email or web address], password : [email or web address]

## 2017-12-12 ENCOUNTER — Other Ambulatory Visit: Payer: Medicare Other

## 2018-01-05 ENCOUNTER — Encounter: Payer: Self-pay | Admitting: Internal Medicine

## 2018-01-05 ENCOUNTER — Ambulatory Visit: Payer: Medicare Other | Admitting: Internal Medicine

## 2018-01-05 VITALS — BP 130/70 | HR 57 | Temp 98.6°F | Resp 18 | Wt 131.4 lb

## 2018-01-05 DIAGNOSIS — I10 Essential (primary) hypertension: Secondary | ICD-10-CM | POA: Diagnosis not present

## 2018-01-05 DIAGNOSIS — I483 Typical atrial flutter: Secondary | ICD-10-CM

## 2018-01-05 DIAGNOSIS — F439 Reaction to severe stress, unspecified: Secondary | ICD-10-CM | POA: Diagnosis not present

## 2018-01-05 DIAGNOSIS — D72819 Decreased white blood cell count, unspecified: Secondary | ICD-10-CM

## 2018-01-05 DIAGNOSIS — Z1231 Encounter for screening mammogram for malignant neoplasm of breast: Secondary | ICD-10-CM | POA: Diagnosis not present

## 2018-01-05 DIAGNOSIS — R945 Abnormal results of liver function studies: Secondary | ICD-10-CM | POA: Diagnosis not present

## 2018-01-05 DIAGNOSIS — E7800 Pure hypercholesterolemia, unspecified: Secondary | ICD-10-CM

## 2018-01-05 DIAGNOSIS — Z1239 Encounter for other screening for malignant neoplasm of breast: Secondary | ICD-10-CM

## 2018-01-05 DIAGNOSIS — R7989 Other specified abnormal findings of blood chemistry: Secondary | ICD-10-CM

## 2018-01-05 NOTE — Progress Notes (Signed)
Patient ID: Anita Benjamin, female   DOB: January 19, 1934, 84 y.o.   MRN: [id number]   Subjective:    Patient ID: Anita Benjamin, female    DOB: Jul 19, 1934, 84 y.o.   MRN: [id number]  HPI  Patient here for a scheduled follow up.  She reports she is doing relatively well.  Recently saw Dr Rockey Situ 11/24/17 for f/u atrial flutter.  Off amiodarone.  Takes metoprolol prn.  Was having increased tachycardia and palpitations which she attributed to Pepcid.  Since stopping the medication, her symptoms have improved/resolved.  No chest pain.  No significant increased heart rate or palpitations.  No acid reflux.  No abdominal pain.  No bowel change.  Overall she feels she is doing well.  Discussed tapering sertraline.  She is not sure if needs.  Is off crestor.     Past Medical History:  Diagnosis Date  . Anemia   . Fibrocystic breast disease   . History of chicken pox   . Hypercholesterolemia   . Hypertension    Past Surgical History:  Procedure Laterality Date  . BREAST BIOPSY Right 2014   NEG  . Laser repair of torn retina     Family History  Problem Relation Age of Onset  . Heart disease Mother 36       Heart attack  . Cancer Father 67       lung cancer  . Breast cancer Paternal Aunt        x3  . Colon cancer Neg Hx    Social History   Socioeconomic History  . Marital status: Widowed    Spouse name: Not on file  . Number of children: 2  . Years of education: Not on file  . Highest education level: Not on file  Occupational History  . Not on file  Social Needs  . Financial resource strain: Not on file  . Food insecurity:    Worry: Not on file    Inability: Not on file  . Transportation needs:    Medical: Not on file    Non-medical: Not on file  Tobacco Use  . Smoking status: Never Smoker  . Smokeless tobacco: Never Used  Substance and Sexual Activity  . Alcohol use: No    Alcohol/week: 0.0 oz  . Drug use: No  . Sexual activity: Not on file  Lifestyle  . Physical activity:    Days  per week: Not on file    Minutes per session: Not on file  . Stress: Not on file  Relationships  . Social connections:    Talks on phone: Not on file    Gets together: Not on file    Attends religious service: Not on file    Active member of club or organization: Not on file    Attends meetings of clubs or organizations: Not on file    Relationship status: Not on file  Other Topics Concern  . Not on file  Social History Narrative  . Not on file    Outpatient Encounter Medications as of 01/05/2018  Medication Sig  . Ascorbic Acid (VITAMIN C) 250 MG CHEW Chew by mouth daily.  Marland Kitchen ELIQUIS 5 MG TABS tablet TAKE 1 TABLET BY MOUTH TWICE DAILY  . Flaxseed, Linseed, (FLAX SEEDS PO) Take by mouth daily.   . irbesartan (AVAPRO) 150 MG tablet TAKE 1 TABLET BY MOUTH ONCE DAILY  . metoprolol succinate (TOPROL-XL) 25 MG 24 hr tablet Take 1 tablet (25 mg total) by mouth daily as needed.  Marland Kitchen  Multiple Vitamin (MULTIVITAMIN) tablet Take 1 tablet by mouth daily.  . NON FORMULARY ViaActiv Takes 1 tablet by mouth daily.  . Selenium (SELENICAPS-200 PO) Take 200 mg by mouth daily.  . sertraline (ZOLOFT) 50 MG tablet TAKE 1 TABLET BY MOUTH ONCE DAILY (Patient taking differently: TAKE 1/2 TABLET BY MOUTH ONCE DAILY)  . [DISCONTINUED] ranitidine (ZANTAC) 150 MG tablet Take 150 mg by mouth at bedtime.  . [DISCONTINUED] famotidine (PEPCID) 20 MG tablet TAKE 1 TABLET BY MOUTH ONCE EVERY MORNING 30 MINUTES BEFORE BREAKFAST (Patient not taking: Reported on 01/05/2018)   No facility-administered encounter medications on file as of 01/05/2018.     Review of Systems  Constitutional: Negative for appetite change and unexpected weight change.  HENT: Negative for congestion and sinus pressure.   Respiratory: Negative for cough, chest tightness and shortness of breath.   Cardiovascular: Negative for chest pain, palpitations and leg swelling.  Gastrointestinal: Negative for abdominal pain, diarrhea, nausea and vomiting.    Genitourinary: Negative for difficulty urinating and dysuria.  Musculoskeletal: Negative for joint swelling and myalgias.  Skin: Negative for color change and rash.  Neurological: Negative for dizziness, light-headedness and headaches.  Psychiatric/Behavioral: Negative for agitation and dysphoric mood.       Objective:     Blood pressure rechecked by me:  140/68  Physical Exam  Constitutional: She appears well-developed and well-nourished. No distress.  HENT:  Nose: Nose normal.  Mouth/Throat: Oropharynx is clear and moist.  Neck: Neck supple. No thyromegaly present.  Cardiovascular: Normal rate and regular rhythm.  Pulmonary/Chest: Breath sounds normal. No respiratory distress. She has no wheezes.  Abdominal: Soft. Bowel sounds are normal. There is no tenderness.  Musculoskeletal: She exhibits no edema or tenderness.  Lymphadenopathy:    She has no cervical adenopathy.  Skin: No rash noted. No erythema.  Psychiatric: She has a normal mood and affect. Her behavior is normal.    BP 130/70 (BP Location: Left Arm, Patient Position: Sitting, Cuff Size: Normal)   Pulse (!) 57   Temp 98.6 F (37 C) (Oral)   Resp 18   Wt 131 lb 6.4 oz (59.6 kg)   LMP 10/27/1989   SpO2 98%   BMI 20.58 kg/m  Wt Readings from Last 3 Encounters:  01/05/18 131 lb 6.4 oz (59.6 kg)  11/24/17 131 lb 12 oz (59.8 kg)  10/06/17 133 lb 6.4 oz (60.5 kg)     Lab Results  Component Value Date   WBC 5.0 10/21/2017   HGB 14.0 10/21/2017   HCT 41.2 10/21/2017   PLT 284.0 10/21/2017   GLUCOSE 101 (H) 10/21/2017   CHOL 207 (H) 10/21/2017   TRIG 108.0 10/21/2017   HDL 67.10 10/21/2017   LDLDIRECT 117.7 11/06/2013   LDLCALC 118 (H) 10/21/2017   ALT 12 10/21/2017   AST 16 10/21/2017   NA 141 10/21/2017   K 4.9 10/21/2017   CL 103 10/21/2017   CREATININE 0.93 10/21/2017   BUN 19 10/21/2017   CO2 32 10/21/2017   TSH 4.16 06/10/2017    Dg Bone Density  Result Date: 04/04/2017 EXAM: DUAL X-RAY  ABSORPTIOMETRY (DXA) FOR BONE MINERAL DENSITY IMPRESSION: Dear Dr. Einar Pheasant, Your patient Lela Murfin completed a FRAX assessment on 04/04/2017 using the [place] (analysis version: 14.10) manufactured by [employer]. The following summarizes the results of our evaluation. PATIENT BIOGRAPHICAL: Name: Rafael, Quesada Patient ID: [id number] Birth Date: September 08, 1934 Height:    66.5 in. Gender:     Female  [age]       Weight:    132.3 lbs. Ethnicity:  White                            Exam Date: 04/04/2017 FRAX* RESULTS:  (version: 3.5) 10-year Probability of Fracture1 Major Osteoporotic Fracture2 Hip Fracture 17.0% 4.0% Population: [place] (Caucasian) Risk Factors: History of Fracture (Adult) Based on Femur (Right) Neck BMD 1 -The 10-year probability of fracture may be lower than reported if the patient has received treatment. 2 -Major Osteoporotic Fracture: Clinical Spine, Forearm, Hip or Shoulder *FRAX is a Materials engineer of the [employer] for Metabolic Bone Disease, a [place] (WHO) [facility]. ASSESSMENT: The probability of a major osteoporotic fracture is 17.0% within the next ten years. The probability of a hip fracture is 4.0% within the next ten years. . Dear Dr. Einar Pheasant, Your patient Jamilette Suchocki completed a BMD test on 04/04/2017 using the [place] (analysis version: 14.10) manufactured by [employer]. The following summarizes the results of our evaluation. PATIENT BIOGRAPHICAL: Name: Jelissa, Espiritu Patient ID: [id number] Birth Date: 1934/03/20 Height: 66.5 in. Gender: Female Exam Date: 04/04/2017 Weight: 132.3 lbs. Indications: Advanced Age, Caucasian, History of Fracture (Adult), Postmenopausal Fractures: Left wrist Treatments: Calcium, Vitamin D ASSESSMENT: The BMD measured at Femur Total Right is 0.805 g/cm2 with a T-score of -1.6. This patient is considered osteopenic according to [place]  [facility]) criteria. L3-L4 were excluded due to degenerative changes. Site Region Measured Measured WHO Young Adult BMD Date       Age      Classification T-score AP Spine L1-L2 04/04/2017 [age] Normal 0.8 1.276 g/cm2 DualFemur Total Right 04/04/2017 83.0 Osteopenia -1.6 0.805 g/cm2 World Health Organization [facility]) criteria for post-menopausal, Caucasian Women: Normal:       T-score at or above -1 SD Osteopenia:   T-score between -1 and -2.5 SD Osteoporosis: T-score at or below -2.5 SD RECOMMENDATIONS: [place] recommends that FDA-approved medical therapies be considered in postmenopausal women and men age 74 or older with a: 1. Hip or vertebral (clinical or morphometric) fracture. 2. T-score of < -2.5 at the spine or hip. 3. Ten-year fracture probability by FRAX of 3% or greater for hip fracture or 20% or greater for major osteoporotic fracture. All treatment decisions require clinical judgment and consideration of individual patient factors, including patient preferences, co-morbidities, previous drug use, risk factors not captured in the FRAX model (e.g. falls, vitamin D deficiency, increased bone turnover, interval significant decline in bone density) and possible under - or over-estimation of fracture risk by FRAX. All patients should ensure an adequate intake of dietary calcium (1200 mg/d) and vitamin D (800 IU daily) unless contraindicated. FOLLOW-UP: People with diagnosed cases of osteoporosis or at high risk for fracture should have regular bone mineral density tests. For patients eligible for Medicare, routine testing is allowed once every 2 years. The testing frequency can be increased to one year for patients who have rapidly progressing disease, those who are receiving or discontinuing medical therapy to restore bone mass, or have additional risk factors. I have reviewed this report, and agree with the above findings. [facility] Electronically Signed   By: Lowella Grip  III M.D.   On: 04/04/2017 11:24   Mm Screening Breast Tomo Bilateral  Result Date: 04/04/2017 CLINICAL DATA:  Screening. EXAM: 2D DIGITAL SCREENING BILATERAL  MAMMOGRAM WITH CAD AND ADJUNCT TOMO COMPARISON:  Previous exam(s). ACR Breast Density Category c: The breast tissue is heterogeneously dense, which may obscure small masses. FINDINGS: There are no findings suspicious for malignancy. Images were processed with CAD. IMPRESSION: No mammographic evidence of malignancy. A result letter of this screening mammogram will be mailed directly to the patient. RECOMMENDATION: Screening mammogram in one year. (Code:SM-B-01Y) BI-RADS CATEGORY  1: Negative. Electronically Signed   By: Pamelia Hoit M.D.   On: 04/04/2017 18:40       Assessment & Plan:   Problem List Items Addressed This Visit    Abnormal liver function test    Follow liver panel.       Atrial flutter (HCC)    In SR.  Off amidarone.  Only takes metoprolol prn.  Followed by cardiology.  Doing better.  Follow.        Essential hypertension    Blood pressure as outlined.  Same medication regimen.  Follow pressures.  Follow metabolic panel.        Hypercholesterolemia    Off crestor.  Follow lipid panel.        Relevant Orders   Comprehensive metabolic panel   Lipid panel   Leukopenia    Follow cbc.       Stress    Has been on zoloft and doing well.  Will taper.  Follow.         Other Visit Diagnoses    Breast cancer screening    -  Primary   Relevant Orders   MM DIGITAL SCREENING BILATERAL       Einar Pheasant, MD

## 2018-01-08 ENCOUNTER — Encounter: Payer: Self-pay | Admitting: Internal Medicine

## 2018-01-08 NOTE — Assessment & Plan Note (Signed)
Has been on zoloft and doing well.  Will taper.  Follow.

## 2018-01-08 NOTE — Assessment & Plan Note (Signed)
Off crestor.  Follow lipid panel.   

## 2018-01-08 NOTE — Assessment & Plan Note (Signed)
Blood pressure as outlined.  Same medication regimen.  Follow pressures.  Follow metabolic panel.  

## 2018-01-08 NOTE — Assessment & Plan Note (Signed)
Follow cbc.  

## 2018-01-08 NOTE — Assessment & Plan Note (Signed)
Follow liver panel.  

## 2018-01-08 NOTE — Assessment & Plan Note (Signed)
In SR.  Off amidarone.  Only takes metoprolol prn.  Followed by cardiology.  Doing better.  Follow.

## 2018-01-12 ENCOUNTER — Telehealth: Payer: Self-pay | Admitting: Internal Medicine

## 2018-01-12 NOTE — Telephone Encounter (Signed)
What would you like for me to send in ? 

## 2018-01-12 NOTE — Telephone Encounter (Signed)
Copied from [place] [phone]. Topic: Quick Communication - Rx Refill/Question >> Jan 12, 2018  4:21 PM Margot Ables wrote: Medication: irbesartan (AVAPRO) 150 MG tablet - on manufacturer back order for all strengths. Requesting RX for change of medication. Please advise.  Preferred Pharmacy (with phone number or street name): [facility], [place] - [place]. [phone] (Phone) [phone] (Fax)

## 2018-01-13 MED ORDER — LOSARTAN POTASSIUM 50 MG PO TABS: 50.0000 mg | ORAL_TABLET | Freq: Every day | ORAL | 0 refills | Status: DC

## 2018-01-13 NOTE — Telephone Encounter (Signed)
Rx sent in. Avapro d/c'd. Patient aware.

## 2018-01-13 NOTE — Telephone Encounter (Signed)
Notify pt that she can try losartan 50mg  q day.  Will need to send in new rx and take avapro off med list.  Let her know that this is the same type of medication.  Also, she will need to spot check her pressure and record.  Send in over the next few weeks.  May need to adjust dose, depending on how she responds.

## 2018-02-09 ENCOUNTER — Other Ambulatory Visit: Payer: Self-pay | Admitting: Internal Medicine

## 2018-02-13 ENCOUNTER — Ambulatory Visit (INDEPENDENT_AMBULATORY_CARE_PROVIDER_SITE_OTHER): Payer: Medicare Other

## 2018-02-13 VITALS — BP 136/68 | HR 57 | Temp 98.1°F | Resp 14 | Ht 67.0 in | Wt 131.8 lb

## 2018-02-13 DIAGNOSIS — Z0000 Encounter for general adult medical examination without abnormal findings: Secondary | ICD-10-CM

## 2018-02-13 NOTE — Progress Notes (Addendum)
Subjective:   Anita Benjamin is a 84 y.o. female who presents for Medicare Annual (Subsequent) preventive examination.  Review of Systems:  No ROS.  Medicare Wellness Visit. Additional risk factors are reflected in the social history.  Cardiac Risk Factors include: advanced age (>49men, >53 women);hypertension     Objective:     Vitals: BP 136/68 (BP Location: Left Arm, Patient Position: Sitting, Cuff Size: Normal)   Pulse (!) 57   Temp 98.1 F (36.7 C) (Oral)   Resp 14   Ht 5\' 7"  (1.702 m)   Wt 131 lb 12.8 oz (59.8 kg)   LMP 10/27/1989   SpO2 98%   BMI 20.64 kg/m   Body mass index is 20.64 kg/m.  Advanced Directives 02/13/2018 02/10/2017  Does Patient Have a Medical Advance Directive? Yes No  Type of Paramedic of [place];Living will -  Does patient want to make changes to medical advance directive? No - Patient declined -  Copy of [place] in Chart? No - copy requested -  Would patient like information on creating a medical advance directive? - No - Patient declined    Tobacco Social History   Tobacco Use  Smoking Status Never Smoker  Smokeless Tobacco Never Used     Counseling given: Not Answered   Clinical Intake:  Pre-visit preparation completed: Yes  Pain : No/denies pain     Nutritional Status: BMI of 19-24  Normal Diabetes: No  How often do you need to have someone help you when you read instructions, pamphlets, or other written materials from your doctor or pharmacy?: 1 - Never  Interpreter Needed?: No     Past Medical History:  Diagnosis Date  . Anemia   . Fibrocystic breast disease   . History of chicken pox   . Hypercholesterolemia   . Hypertension    Past Surgical History:  Procedure Laterality Date  . BREAST BIOPSY Right 2014   NEG  . Laser repair of torn retina     Family History  Problem Relation Age of Onset  . Heart disease Mother 12       Heart attack  . Cancer Father 61    lung cancer  . Breast cancer Paternal Aunt        x3  . Colon cancer Neg Hx    Social History   Socioeconomic History  . Marital status: Widowed    Spouse name: Not on file  . Number of children: 2  . Years of education: Not on file  . Highest education level: Not on file  Occupational History  . Not on file  Social Needs  . Financial resource strain: Not hard at all  . Food insecurity:    Worry: Never true    Inability: Never true  . Transportation needs:    Medical: No    Non-medical: No  Tobacco Use  . Smoking status: Never Smoker  . Smokeless tobacco: Never Used  Substance and Sexual Activity  . Alcohol use: No    Alcohol/week: 0.0 oz  . Drug use: No  . Sexual activity: Not on file  Lifestyle  . Physical activity:    Days per week: 0 days    Minutes per session: Not on file  . Stress: Not at all  Relationships  . Social connections:    Talks on phone: Not on file    Gets together: Not on file    Attends religious service: Not on file  Active member of club or organization: Not on file    Attends meetings of clubs or organizations: Not on file    Relationship status: Not on file  Other Topics Concern  . Not on file  Social History Narrative  . Not on file    Outpatient Encounter Medications as of 02/13/2018  Medication Sig  . Ascorbic Acid (VITAMIN C) 250 MG CHEW Chew by mouth daily.  . calcium citrate-vitamin D 500-500 MG-UNIT chewable tablet Chew 1 tablet by mouth daily.  Marland Kitchen ELIQUIS 5 MG TABS tablet TAKE 1 TABLET BY MOUTH TWICE DAILY  . Flaxseed, Linseed, (FLAX SEEDS PO) Take by mouth daily.   Marland Kitchen losartan (COZAAR) 50 MG tablet TAKE 1 TABLET BY MOUTH ONCE DAILY.  . metoprolol succinate (TOPROL-XL) 25 MG 24 hr tablet Take 1 tablet (25 mg total) by mouth daily as needed.  . Multiple Vitamin (MULTIVITAMIN) tablet Take 1 tablet by mouth daily.  . NON FORMULARY ViaActiv Takes 1 tablet by mouth daily.  . Selenium (SELENICAPS-200 PO) Take 200 mg by mouth  daily.  . [DISCONTINUED] sertraline (ZOLOFT) 50 MG tablet TAKE 1 TABLET BY MOUTH ONCE DAILY (Patient taking differently: TAKE 1/2 TABLET BY MOUTH ONCE DAILY)   No facility-administered encounter medications on file as of 02/13/2018.     Activities of Daily Living In your present state of health, do you have any difficulty performing the following activities: 02/13/2018  Hearing? N  Vision? N  Difficulty concentrating or making decisions? N  Walking or climbing stairs? N  Dressing or bathing? N  Doing errands, shopping? N  Preparing Food and eating ? N  Using the Toilet? N  In the past six months, have you accidently leaked urine? N  Do you have problems with loss of bowel control? N  Managing your Medications? N  Managing your Finances? N  Housekeeping or managing your Housekeeping? N  Some recent data might be hidden    Patient Care Team: Einar Pheasant, MD as PCP - General (Internal Medicine) Bary Castilla, Forest Gleason, MD as Consulting Physician (General Surgery) Minna Merritts, MD as Consulting Physician (Cardiology)    Assessment:   This is a routine wellness examination for Karren.  The goal of the wellness visit is to assist the patient how to close the gaps in care and create a preventative care plan for the patient.   The roster of all physicians providing medical care to patient is listed in the Snapshot section of the chart.  Taking calcium VIT D as appropriate/Osteoporosis risk reviewed.    Safety issues reviewed; lives with daughter.  Smoke and carbon monoxide detectors in the home. No firearms in the home. Wears seatbelts when driving or riding with others. No violence in the home.  They do not have excessive sun exposure.  Discussed the need for sun protection: hats, long sleeves and the use of sunscreen if there is significant sun exposure.  Patient is alert, normal appearance, oriented to person/place/and time.  Correctly identified the president of the [place] and  recalls of 3/3 words. Performs simple calculations and can read correct time from watch face. Displays appropriate judgement.  No new identified risk were noted.  No failures at ADL's or IADL's.    BMI- discussed the importance of a healthy diet, water intake and the benefits of aerobic exercise. Educational material provided.   24 hour diet recall: Regular diet  Dental- every 12 months.   Eye- Visual acuity not assessed per patient preference since they have  regular follow up with the ophthalmologist.  Wears corrective lenses.  Sleep patterns- Sleeps through the night without issues.   Health maintenance gaps- closed.  Patient Concerns: None at this time. Follow up with PCP as needed.  Exercise Activities and Dietary recommendations Current Exercise Habits: The patient does not participate in regular exercise at present  Goals    . Increase physical activity     Walk for exercise    . Low cholesterol diet       Fall Risk Fall Risk  02/13/2018 10/06/2017 02/10/2017 01/20/2016 10/21/2015  Falls in the past year? No No No No No  Number falls in past yr: - - - - -  Injury with Fall? - - - - -  Comment - - - - -   Depression Screen PHQ 2/9 Scores 02/13/2018 10/06/2017 02/10/2017 02/10/2017  PHQ - 2 Score 0 0 0 0  PHQ- 9 Score - - 0 0     Cognitive Function MMSE - Mini Mental State Exam 02/10/2017  Orientation to time 5  Orientation to Place 5  Registration 3  Attention/ Calculation 5  Recall 3  Language- name 2 objects 2  Language- repeat 1  Language- follow 3 step command 3  Language- read & follow direction 1  Write a sentence 1  Copy design 1  Total score 30     6CIT Screen 02/13/2018  What Year? 0 points  What month? 0 points  What time? 0 points  Count back from 20 0 points  Months in reverse 0 points  Repeat phrase 0 points  Total Score 0    Immunization History  Administered Date(s) Administered  . Influenza, High Dose Seasonal PF 06/10/2017  .  Influenza,inj,Quad PF,6+ Mos 06/27/2013, 07/04/2014, 06/05/2015, 05/27/2016  . Pneumococcal Conjugate-13 10/21/2015  . Pneumococcal Polysaccharide-23 10/06/2017  . Tdap 10/27/2012   Screening Tests Health Maintenance  Topic Date Due  . MAMMOGRAM  04/04/2018  . INFLUENZA VACCINE  05/04/2018  . TETANUS/TDAP  10/27/2022  . DEXA SCAN  Completed  . PNA vac Low Risk Adult  Completed      Plan:    End of life planning; Advance aging; Advanced directives discussed. Copy of current HCPOA/Living Will requested.    I have personally reviewed and noted the following in the patient's chart:   . Medical and social history . Use of alcohol, tobacco or illicit drugs  . Current medications and supplements . Functional ability and status . Nutritional status . Physical activity . Advanced directives . List of other physicians . Hospitalizations, surgeries, and ER visits in previous 12 months . Vitals . Screenings to include cognitive, depression, and falls . Referrals and appointments  In addition, I have reviewed and discussed with patient certain preventive protocols, quality metrics, and best practice recommendations. A written personalized care plan for preventive services as well as general preventive health recommendations were provided to patient.     Varney Biles, LPN  3/82/5053   Reviewed above information.  Agree with assessment and plan.    Dr Nicki Reaper

## 2018-02-13 NOTE — Patient Instructions (Addendum)
  Anita Benjamin , Thank you for taking time to come for your Medicare Wellness Visit. I appreciate your ongoing commitment to your health goals. Please review the following plan we discussed and let me know if I can assist you in the future.   Follow up as needed.    Bring a copy of your [place] and/or Living Will to be scanned into chart.  Have a great day!  These are the goals we discussed: Goals    . Increase physical activity     Walk for exercise    . Low cholesterol diet       This is a list of the screening recommended for you and due dates:  Health Maintenance  Topic Date Due  . Mammogram  04/04/2018  . Flu Shot  05/04/2018  . Tetanus Vaccine  10/27/2022  . DEXA scan (bone density measurement)  Completed  . Pneumonia vaccines  Completed

## 2018-03-13 ENCOUNTER — Other Ambulatory Visit: Payer: Self-pay | Admitting: Cardiovascular Disease

## 2018-03-13 ENCOUNTER — Other Ambulatory Visit: Payer: Self-pay | Admitting: Internal Medicine

## 2018-03-13 NOTE — Telephone Encounter (Signed)
Please review for refill, thanks ! 

## 2018-04-07 ENCOUNTER — Ambulatory Visit
Admission: RE | Admit: 2018-04-07 | Discharge: 2018-04-07 | Disposition: A | Discharge status: Home / Self Care | Payer: Medicare Other | Source: Ambulatory Visit | Attending: Internal Medicine | Admitting: Internal Medicine

## 2018-04-07 DIAGNOSIS — Z1231 Encounter for screening mammogram for malignant neoplasm of breast: Secondary | ICD-10-CM | POA: Diagnosis present

## 2018-04-07 DIAGNOSIS — Z1239 Encounter for other screening for malignant neoplasm of breast: Secondary | ICD-10-CM

## 2018-04-21 ENCOUNTER — Other Ambulatory Visit (INDEPENDENT_AMBULATORY_CARE_PROVIDER_SITE_OTHER): Payer: Medicare Other

## 2018-04-21 DIAGNOSIS — E7800 Pure hypercholesterolemia, unspecified: Secondary | ICD-10-CM | POA: Diagnosis not present

## 2018-04-21 LAB — COMPREHENSIVE METABOLIC PANEL
ALT: 13 U/L (ref 0–35)
AST: 14 U/L (ref 0–37)
Albumin: 4.3 g/dL (ref 3.5–5.2)
Alkaline Phosphatase: 58 U/L (ref 39–117)
BUN: 17 mg/dL (ref 6–23)
CO2: 31 mEq/L (ref 19–32)
Calcium: 9.4 mg/dL (ref 8.4–10.5)
Chloride: 104 mEq/L (ref 96–112)
Creatinine, Ser: 0.98 mg/dL (ref 0.40–1.20)
GFR: 57.73 mL/min — ABNORMAL LOW (ref >60.00)
Glucose, Bld: 102 mg/dL — ABNORMAL HIGH (ref 70–99)
Potassium: 4.5 mEq/L (ref 3.5–5.1)
Sodium: 141 mEq/L (ref 135–145)
Total Bilirubin: 0.6 mg/dL (ref 0.2–1.2)
Total Protein: 7 g/dL (ref 6.0–8.3)

## 2018-04-21 LAB — LIPID PANEL
Cholesterol: 200 mg/dL (ref 0–200)
HDL: 73.3 mg/dL (ref >39.00)
LDL Cholesterol: 105 mg/dL — ABNORMAL HIGH (ref 0–99)
NonHDL: 126.93
Total CHOL/HDL Ratio: 3
Triglycerides: 110 mg/dL (ref 0.0–149.0)
VLDL: 22 mg/dL (ref 0.0–40.0)

## 2018-04-25 ENCOUNTER — Ambulatory Visit: Payer: Medicare Other | Admitting: Internal Medicine

## 2018-04-25 ENCOUNTER — Encounter: Payer: Self-pay | Admitting: Internal Medicine

## 2018-04-25 DIAGNOSIS — R945 Abnormal results of liver function studies: Secondary | ICD-10-CM | POA: Diagnosis not present

## 2018-04-25 DIAGNOSIS — D72819 Decreased white blood cell count, unspecified: Secondary | ICD-10-CM

## 2018-04-25 DIAGNOSIS — F4329 Adjustment disorder with other symptoms: Secondary | ICD-10-CM | POA: Diagnosis not present

## 2018-04-25 DIAGNOSIS — R7989 Other specified abnormal findings of blood chemistry: Secondary | ICD-10-CM

## 2018-04-25 DIAGNOSIS — E7800 Pure hypercholesterolemia, unspecified: Secondary | ICD-10-CM

## 2018-04-25 DIAGNOSIS — I483 Typical atrial flutter: Secondary | ICD-10-CM | POA: Diagnosis not present

## 2018-04-25 DIAGNOSIS — I10 Essential (primary) hypertension: Secondary | ICD-10-CM

## 2018-04-25 DIAGNOSIS — F439 Reaction to severe stress, unspecified: Secondary | ICD-10-CM

## 2018-04-25 MED ORDER — SERTRALINE HCL 50 MG PO TABS: 25.0000 mg | ORAL_TABLET | Freq: Every day | ORAL | 2 refills | Status: DC

## 2018-04-25 NOTE — Progress Notes (Signed)
Patient ID: Anita Benjamin, female   DOB: 05-Nov-1933, 84 y.o.   MRN: [id number]   Subjective:    Patient ID: Anita Benjamin, female    DOB: 12/27/1933, 84 y.o.   MRN: [id number]  HPI  Patient here for a scheduled follow up.  She reports she is doing relatively well.  Feels good.  Trying to stay active.  No chest pain.  Breathing stable.  No acid reflux.  No abdominal pain.  Bowels moving.  Eating.  No nausea or vomiting.  States heart - doing well.  No significant increased heart rate or palpitations.  Discussed lab results.  Declines cholesterol medication.  Reports going back on zoloft four weeks ago.  Taking 1/2 tablet per day.  Doing better.  Sleeping better.     Past Medical History:  Diagnosis Date  . Anemia   . Fibrocystic breast disease   . History of chicken pox   . Hypercholesterolemia   . Hypertension    Past Surgical History:  Procedure Laterality Date  . BREAST BIOPSY Right 2014   NEG  . Laser repair of torn retina     Family History  Problem Relation Age of Onset  . Heart disease Mother 67       Heart attack  . Cancer Father 37       lung cancer  . Breast cancer Paternal Aunt        x3  . Colon cancer Neg Hx    Social History   Socioeconomic History  . Marital status: Widowed    Spouse name: Not on file  . Number of children: 2  . Years of education: Not on file  . Highest education level: Not on file  Occupational History  . Not on file  Social Needs  . Financial resource strain: Not hard at all  . Food insecurity:    Worry: Never true    Inability: Never true  . Transportation needs:    Medical: No    Non-medical: No  Tobacco Use  . Smoking status: Never Smoker  . Smokeless tobacco: Never Used  Substance and Sexual Activity  . Alcohol use: No    Alcohol/week: 0.0 oz  . Drug use: No  . Sexual activity: Not on file  Lifestyle  . Physical activity:    Days per week: 0 days    Minutes per session: Not on file  . Stress: Not at all  Relationships  .  Social connections:    Talks on phone: Not on file    Gets together: Not on file    Attends religious service: Not on file    Active member of club or organization: Not on file    Attends meetings of clubs or organizations: Not on file    Relationship status: Not on file  Other Topics Concern  . Not on file  Social History Narrative  . Not on file    Outpatient Encounter Medications as of 04/25/2018  Medication Sig  . Ascorbic Acid (VITAMIN C) 250 MG CHEW Chew by mouth daily.  . calcium citrate-vitamin D 500-500 MG-UNIT chewable tablet Chew 1 tablet by mouth daily.  Marland Kitchen ELIQUIS 5 MG TABS tablet TAKE 1 TABLET BY MOUTH TWICE DAILY.  Marland Kitchen losartan (COZAAR) 50 MG tablet TAKE 1 TABLET BY MOUTH ONCE DAILY  . metoprolol succinate (TOPROL-XL) 25 MG 24 hr tablet Take 1 tablet (25 mg total) by mouth daily as needed.  . Multiple Vitamin (MULTIVITAMIN) tablet Take 1 tablet by mouth daily.  Marland Kitchen  NON FORMULARY ViaActiv Takes 1 tablet by mouth daily.  . Selenium (SELENICAPS-200 PO) Take 200 mg by mouth daily.  . sertraline (ZOLOFT) 50 MG tablet Take 0.5 tablets (25 mg total) by mouth at bedtime.  . [DISCONTINUED] sertraline (ZOLOFT) 50 MG tablet Take 25 mg by mouth at bedtime.  . [DISCONTINUED] Flaxseed, Linseed, (FLAX SEEDS PO) Take by mouth daily.    No facility-administered encounter medications on file as of 04/25/2018.     Review of Systems  Constitutional: Negative for appetite change and unexpected weight change.  HENT: Negative for congestion and sinus pressure.   Respiratory: Negative for cough, chest tightness and shortness of breath.   Cardiovascular: Negative for chest pain, palpitations and leg swelling.  Gastrointestinal: Negative for abdominal pain, diarrhea, nausea and vomiting.  Genitourinary: Negative for difficulty urinating and dysuria.  Musculoskeletal: Negative for joint swelling and myalgias.  Skin: Negative for color change and rash.  Neurological: Negative for dizziness,  light-headedness and headaches.  Psychiatric/Behavioral: Negative for agitation and dysphoric mood.       Objective:    Physical Exam  Constitutional: She appears well-developed and well-nourished. No distress.  HENT:  Nose: Nose normal.  Mouth/Throat: Oropharynx is clear and moist.  Neck: Neck supple. No thyromegaly present.  Cardiovascular: Normal rate and regular rhythm.  Pulmonary/Chest: Breath sounds normal. No respiratory distress. She has no wheezes.  Abdominal: Soft. Bowel sounds are normal. There is no tenderness.  Musculoskeletal: She exhibits no edema or tenderness.  Lymphadenopathy:    She has no cervical adenopathy.  Skin: No rash noted. No erythema.  Psychiatric: She has a normal mood and affect. Her behavior is normal.    BP 138/74 (BP Location: Left Arm, Patient Position: Sitting, Cuff Size: Normal)   Pulse 63   Temp 97.8 F (36.6 C) (Oral)   Resp 18   Wt 129 lb 12.8 oz (58.9 kg)   LMP 10/27/1989   SpO2 98%   BMI 20.33 kg/m  Wt Readings from Last 3 Encounters:  04/25/18 129 lb 12.8 oz (58.9 kg)  02/13/18 131 lb 12.8 oz (59.8 kg)  01/05/18 131 lb 6.4 oz (59.6 kg)     Lab Results  Component Value Date   WBC 5.0 10/21/2017   HGB 14.0 10/21/2017   HCT 41.2 10/21/2017   PLT 284.0 10/21/2017   GLUCOSE 102 (H) 04/21/2018   CHOL 200 04/21/2018   TRIG 110.0 04/21/2018   HDL 73.30 04/21/2018   LDLDIRECT 117.7 11/06/2013   LDLCALC 105 (H) 04/21/2018   ALT 13 04/21/2018   AST 14 04/21/2018   NA 141 04/21/2018   K 4.5 04/21/2018   CL 104 04/21/2018   CREATININE 0.98 04/21/2018   BUN 17 04/21/2018   CO2 31 04/21/2018   TSH 4.16 06/10/2017    Mm 3d Screen Breast Bilateral  Result Date: 04/07/2018 CLINICAL DATA:  Screening. EXAM: DIGITAL SCREENING BILATERAL MAMMOGRAM WITH TOMO AND CAD COMPARISON:  Previous exam(s). ACR Breast Density Category b: There are scattered areas of fibroglandular density. FINDINGS: There are no findings suspicious for  malignancy. Images were processed with CAD. IMPRESSION: No mammographic evidence of malignancy. A result letter of this screening mammogram will be mailed directly to the patient. RECOMMENDATION: Screening mammogram in one year. (Code:SM-B-01Y) BI-RADS CATEGORY  1: Negative. Electronically Signed   By: Lillia Mountain M.D.   On: 04/07/2018 12:05       Assessment & Plan:   Problem List Items Addressed This Visit    Abnormal liver function test  Follow liver panel.        Adjustment disorder    Back on zoloft.  Doing well.  Follow.        Atrial flutter (HCC)    In SR.  Off amidarone.  Has metoprolol if needed.  Followed by cardiology.  Feels stable.        Essential hypertension    Outside blood pressure readings under good control.  Same medication.  Follow pressures.  Follow metabolic panel.        Relevant Orders   CBC with Differential/Platelet   TSH   Basic metabolic panel   Hypercholesterolemia    Off crestor.  Desires not to restart.  Follow.        Relevant Orders   Lipid panel   Hepatic function panel   Leukopenia    Follow cbc.       Stress    Back on zoloft.  Doing well.  Follow.            Einar Pheasant, MD

## 2018-04-26 ENCOUNTER — Encounter: Payer: Self-pay | Admitting: Internal Medicine

## 2018-04-26 NOTE — Assessment & Plan Note (Signed)
Off crestor.  Desires not to restart.  Follow.

## 2018-04-26 NOTE — Assessment & Plan Note (Signed)
Follow liver panel.  

## 2018-04-26 NOTE — Assessment & Plan Note (Signed)
In SR.  Off amidarone.  Has metoprolol if needed.  Followed by cardiology.  Feels stable.

## 2018-04-26 NOTE — Assessment & Plan Note (Signed)
Back on zoloft.  Doing well.  Follow.

## 2018-04-26 NOTE — Assessment & Plan Note (Signed)
Outside blood pressure readings under good control.  Same medication.  Follow pressures.  Follow metabolic panel.

## 2018-04-26 NOTE — Assessment & Plan Note (Signed)
Follow cbc.  

## 2018-07-13 ENCOUNTER — Other Ambulatory Visit: Payer: Self-pay | Admitting: Cardiovascular Disease

## 2018-07-13 ENCOUNTER — Other Ambulatory Visit: Payer: Self-pay | Admitting: Internal Medicine

## 2018-07-13 NOTE — Telephone Encounter (Signed)
Refill Request.  

## 2018-07-14 NOTE — Telephone Encounter (Signed)
Pt meets criteria for dose reduction to Eliquis 2.5mg  BID as age >80years and weight is less than 60kg. Will route to Dr. Rockey Situ to advise on dose.

## 2018-07-14 NOTE — Telephone Encounter (Signed)
Routing as a patient call to Dr. Rockey Situ

## 2018-07-18 MED ORDER — APIXABAN 2.5 MG PO TABS: 2.5000 mg | ORAL_TABLET | Freq: Two times a day (BID) | ORAL | 1 refills | Status: DC

## 2018-07-18 NOTE — Telephone Encounter (Signed)
Called spoke with pt, pt is totally out of Eliquis.  Sent in rx for Eliquis 2.5mg  BID since pt meets criteria for dose reduction and pt is out of drug.

## 2018-07-18 NOTE — Telephone Encounter (Signed)
Pt last saw Dr Rockey Situ 11/24/17, last labs 04/21/18 Creat 0.98, age 84, weight decreasing 58.9kg at present.  Based on specified criteria pt is on on appropriate dosage of Eliquis since weight has dipped below the 60kg cut off.  Pt should be on Eliquis 2.5mg  BID, please advise if switch is appropriate.  Thanks

## 2018-07-18 NOTE — Telephone Encounter (Signed)
°*  STAT* If patient is at the pharmacy, call can be transferred to refill team.   1. Which medications need to be refilled? (please list name of each medication and dose if known) Eliquis 5 mg po BID  2. Which pharmacy/location (including street and city if local pharmacy) is medication to be sent to? [facility]  3. Do they need a 30 day or 90 day supply?  90   See note below patient still waiting on approval from [facility] and is now out of meds.  Please call pharmacy to advise.

## 2018-08-21 ENCOUNTER — Other Ambulatory Visit (INDEPENDENT_AMBULATORY_CARE_PROVIDER_SITE_OTHER): Payer: Medicare Other

## 2018-08-21 DIAGNOSIS — I10 Essential (primary) hypertension: Secondary | ICD-10-CM | POA: Diagnosis not present

## 2018-08-21 DIAGNOSIS — E7800 Pure hypercholesterolemia, unspecified: Secondary | ICD-10-CM | POA: Diagnosis not present

## 2018-08-21 LAB — CBC WITH DIFFERENTIAL/PLATELET
Basophils Absolute: 0 10*3/uL (ref 0.0–0.1)
Basophils Relative: 1.2 % (ref 0.0–3.0)
Eosinophils Absolute: 0.1 10*3/uL (ref 0.0–0.7)
Eosinophils Relative: 2.9 % (ref 0.0–5.0)
HCT: 41.9 % (ref 36.0–46.0)
Hemoglobin: 14.1 g/dL (ref 12.0–15.0)
Lymphocytes Relative: 24.6 % (ref 12.0–46.0)
Lymphs Abs: 1 10*3/uL (ref 0.7–4.0)
MCHC: 33.7 g/dL (ref 30.0–36.0)
MCV: 93.9 fl (ref 78.0–100.0)
Monocytes Absolute: 0.4 10*3/uL (ref 0.1–1.0)
Monocytes Relative: 9.3 % (ref 3.0–12.0)
Neutro Abs: 2.5 10*3/uL (ref 1.4–7.7)
Neutrophils Relative %: 62 % (ref 43.0–77.0)
Platelets: 253 10*3/uL (ref 150.0–400.0)
RBC: 4.46 Mil/uL (ref 3.87–5.11)
RDW: 13.6 % (ref 11.5–15.5)
WBC: 4 10*3/uL (ref 4.0–10.5)

## 2018-08-21 LAB — BASIC METABOLIC PANEL
BUN: 21 mg/dL (ref 6–23)
CO2: 30 mEq/L (ref 19–32)
Calcium: 9.6 mg/dL (ref 8.4–10.5)
Chloride: 105 mEq/L (ref 96–112)
Creatinine, Ser: 0.98 mg/dL (ref 0.40–1.20)
GFR: 57.68 mL/min — ABNORMAL LOW (ref >60.00)
Glucose, Bld: 96 mg/dL (ref 70–99)
Potassium: 4.6 mEq/L (ref 3.5–5.1)
Sodium: 141 mEq/L (ref 135–145)

## 2018-08-21 LAB — LIPID PANEL
Cholesterol: 180 mg/dL (ref 0–200)
HDL: 66.9 mg/dL (ref >39.00)
LDL Cholesterol: 97 mg/dL (ref 0–99)
NonHDL: 112.64
Total CHOL/HDL Ratio: 3
Triglycerides: 79 mg/dL (ref 0.0–149.0)
VLDL: 15.8 mg/dL (ref 0.0–40.0)

## 2018-08-21 LAB — HEPATIC FUNCTION PANEL
ALT: 15 U/L (ref 0–35)
AST: 18 U/L (ref 0–37)
Albumin: 4.1 g/dL (ref 3.5–5.2)
Alkaline Phosphatase: 52 U/L (ref 39–117)
Bilirubin, Direct: 0.1 mg/dL (ref 0.0–0.3)
Total Bilirubin: 0.6 mg/dL (ref 0.2–1.2)
Total Protein: 6.7 g/dL (ref 6.0–8.3)

## 2018-08-21 LAB — TSH: TSH: 3.44 u[IU]/mL (ref 0.35–4.50)

## 2018-08-22 ENCOUNTER — Other Ambulatory Visit: Payer: Medicare Other

## 2018-08-29 ENCOUNTER — Ambulatory Visit: Payer: Medicare Other | Admitting: Internal Medicine

## 2018-08-29 ENCOUNTER — Encounter: Payer: Self-pay | Admitting: Internal Medicine

## 2018-08-29 ENCOUNTER — Encounter: Payer: Self-pay | Admitting: Physician Assistant

## 2018-08-29 VITALS — BP 112/78 | HR 140 | Temp 97.9°F | Resp 18 | Wt 130.4 lb

## 2018-08-29 DIAGNOSIS — R0981 Nasal congestion: Secondary | ICD-10-CM

## 2018-08-29 DIAGNOSIS — E7800 Pure hypercholesterolemia, unspecified: Secondary | ICD-10-CM | POA: Diagnosis not present

## 2018-08-29 DIAGNOSIS — I483 Typical atrial flutter: Secondary | ICD-10-CM | POA: Diagnosis not present

## 2018-08-29 DIAGNOSIS — R000 Tachycardia, unspecified: Secondary | ICD-10-CM

## 2018-08-29 DIAGNOSIS — I10 Essential (primary) hypertension: Secondary | ICD-10-CM | POA: Diagnosis not present

## 2018-08-29 DIAGNOSIS — F439 Reaction to severe stress, unspecified: Secondary | ICD-10-CM

## 2018-08-29 MED ORDER — AMIODARONE HCL 200 MG PO TABS: 200.0000 mg | ORAL_TABLET | Freq: Two times a day (BID) | ORAL | 0 refills | Status: DC

## 2018-08-29 NOTE — Progress Notes (Signed)
Cardiology Office Note Date:  08/30/2018  Patient ID:  Anita Benjamin, DOB [birth year], MRN [id number] PCP:  Einar Pheasant, MD  Cardiologist:  Dr. Rockey Situ, MD    Chief Complaint: Follow-up atrial flutter  History of Present Illness: Anita Benjamin is a 84 y.o. female with history of typical atrial flutter on Eliquis, SVT, HTN, HLD, fatigue, and GERD who presents for evaluation of recurrent atrial flutter.  Notes indicate the patient has a greater than 10-year history of tachyarrhythmia/palpitations.  Prior event monitor from 07/2014 for palpitations showed normal sinus rhythm with sinus tachycardia and an episode of SVT.  Patient was seen in the office on 11/15/2014 tachypalpitations and noted to be an atrial flutter with RVR at that time.  Over the years, she has been managed with metoprolol and amiodarone with good control of her arrhythmia.  And had previously stopped her amiodarone and metoprolol.  She was last seen in the office on 11/24/2017 and noted to be in sinus rhythm with a bradycardic rate.  At that time she noted worsening tachypalpitations.  She felt like her symptoms were attributed to Pepcid.  With transition to Zantac symptoms resolved.  She was advised to take metoprolol as needed for breakthrough tachypalpitations.  She was maintained on Eliquis.  Echo from 12/16/2014 showed an EF of 60 to 65%, normal wall motion, normal LV diastolic function, mild aortic insufficiency, normal size left atrium, RVSF was normal, PASP was normal.  No prior ischemic evaluations.  Labs: 08/21/2018 - CBC unremarkable, TSH normal, LDL 97, LFT normal, sodium 141, potassium 4.6, serum creatinine 0.98  Patient was evaluated by PCP on 11/25 for routine follow-up of URI.  EKG showed atrial flutter with RVR, 128 bpm, incomplete right bundle branch block.  Given patient was already adequately anticoagulated she was advised to take amiodarone 400 mg twice daily x5 days followed by 200 mg twice daily thereafter.   She was advised to take metoprolol in the afternoon for tachycardic heart rates.  Appointment was made for today.  Patient developed sudden onset of tachypalpitations at approximately 7 AM on 08/29/2018.  Symptoms were consistent with her prior episodes of atrial flutter with RVR.  She took a PRN metoprolol 25 mg without much improvement and symptoms.  She was already scheduled to be evaluated by her PCP later that day as noted above for URI symptoms.  It was noted, the patient missed approximately 1 week off her Eliquis and mid October, 2019 as she was unclear why her dose was decreased from 5 mg to 2.5 mg.  However, since mid to late October she has not missed any doses of Eliquis per her report.  She indicates spontaneous resolution of tachypalpitations at approximately 2 PM on 11/26 and has remained symptom-free since then.  She has started amiodarone 400 mg twice daily as directed.  She has not started standing Toprol as she is worried about significant bradycardia and fatigue with this medication.  Since she was last seen, she has not had any falls, BRBPR, or melena.  She notes she last had to use her as needed metoprolol earlier in 08/2018 secondary to tachypalpitations consistent with prior atrial flutter.  She feels well today.   Past Medical History:  Diagnosis Date  . Anemia   . Atrial flutter ([place])   . Fibrocystic breast disease   . History of chicken pox   . Hypercholesterolemia   . Hypertension     Past Surgical History:  Procedure Laterality Date  . BREAST BIOPSY Right  2014   NEG  . Laser repair of torn retina      Current Meds  Medication Sig  . amiodarone (PACERONE) 200 MG tablet Take 1 tablet (200 mg total) by mouth 2 (two) times daily.  Marland Kitchen apixaban (ELIQUIS) 2.5 MG TABS tablet Take 1 tablet (2.5 mg total) by mouth 2 (two) times daily.  . Ascorbic Acid (VITAMIN C) 250 MG CHEW Chew by mouth daily.  . calcium citrate-vitamin D 500-500 MG-UNIT chewable tablet Chew 1 tablet by  mouth daily.  Marland Kitchen losartan (COZAAR) 50 MG tablet TAKE 1 TABLET BY MOUTH ONCE DAILY  . metoprolol succinate (TOPROL-XL) 25 MG 24 hr tablet Take 1 tablet (25 mg total) by mouth daily as needed.  . Multiple Vitamin (MULTIVITAMIN) tablet Take 1 tablet by mouth daily.  . NON FORMULARY ViaActiv Takes 1 tablet by mouth daily.  . Selenium (SELENICAPS-200 PO) Take 200 mg by mouth daily.  . sertraline (ZOLOFT) 50 MG tablet Take 0.5 tablets (25 mg total) by mouth at bedtime.    Allergies:   Oruvail [ketoprofen]; Relafen [nabumetone]; and Timentin [ticarcillin-pot clavulanate]   Social History:  The patient  reports that she has never smoked. She has never used smokeless tobacco. She reports that she does not drink alcohol or use drugs.   Family History:  The patient's family history includes Breast cancer in her paternal aunt; Cancer (age of onset: 34) in her father; Heart disease (age of onset: 26) in her mother.  ROS:   Review of Systems  Constitutional: Negative for chills, diaphoresis, fever, malaise/fatigue and weight loss.  HENT: Negative for congestion.   Eyes: Negative for discharge and redness.  Respiratory: Negative for cough, hemoptysis, sputum production, shortness of breath and wheezing.   Cardiovascular: Positive for palpitations. Negative for chest pain, orthopnea, claudication, leg swelling and PND.       Palpitations resolved at approximately 2 PM on 11/26  Gastrointestinal: Negative for abdominal pain, blood in stool, heartburn, melena, nausea and vomiting.  Genitourinary: Negative for hematuria.  Musculoskeletal: Negative for falls and myalgias.  Skin: Negative for rash.  Neurological: Negative for dizziness, tingling, tremors, sensory change, speech change, focal weakness, loss of consciousness and weakness.  Endo/Heme/Allergies: Does not bruise/bleed easily.  Psychiatric/Behavioral: Negative for substance abuse. The patient is not nervous/anxious.   All other systems reviewed  and are negative.    PHYSICAL EXAM:  VS:  BP (!) 168/68 (BP Location: Left Arm, Patient Position: Sitting, Cuff Size: Normal)   Pulse (!) 59   Ht 5\' 7"  (1.702 m)   Wt 129 lb 8 oz (58.7 kg)   LMP 10/27/1989   BMI 20.28 kg/m  BMI: Body mass index is 20.28 kg/m.  Physical Exam  Constitutional: She is oriented to person, place, and time. She appears well-developed and well-nourished.  HENT:  Head: Normocephalic and atraumatic.  Eyes: Right eye exhibits no discharge. Left eye exhibits no discharge.  Neck: Normal range of motion. No JVD present.  Cardiovascular: Regular rhythm, S1 normal, S2 normal and normal heart sounds. Bradycardia present. Exam reveals no distant heart sounds, no friction rub, no midsystolic click and no opening snap.  No murmur heard. Pulses:      Posterior tibial pulses are 2+ on the right side, and 2+ on the left side.  Pulmonary/Chest: Effort normal and breath sounds normal. No respiratory distress. She has no decreased breath sounds. She has no wheezes. She has no rales. She exhibits no tenderness.  Abdominal: Soft. She exhibits no distension. There  is no tenderness.  Musculoskeletal: She exhibits no edema.  Neurological: She is alert and oriented to person, place, and time.  Skin: Skin is warm and dry. No cyanosis. Nails show no clubbing.  Psychiatric: She has a normal mood and affect. Her speech is normal and behavior is normal. Judgment and thought content normal.     EKG:  Was ordered and interpreted by me today. Shows sinus bradycardia, 59 bpm, normal axis, no acute ST-T changes (suspect inverted P waves in lead V2 is secondary to lead placement)  Recent Labs: 08/21/2018: ALT 15; BUN 21; Creatinine, Ser 0.98; Hemoglobin 14.1; Platelets 253.0; Potassium 4.6; Sodium 141; TSH 3.44  08/21/2018: Cholesterol 180; HDL 66.90; LDL Cholesterol 97; Total CHOL/HDL Ratio 3; Triglycerides 79.0; VLDL 15.8   Estimated Creatinine Clearance: 41 mL/min (by C-G formula  based on SCr of 0.98 mg/dL).   Wt Readings from Last 3 Encounters:  08/30/18 129 lb 8 oz (58.7 kg)  08/29/18 130 lb 6.4 oz (59.1 kg)  04/25/18 129 lb 12.8 oz (58.9 kg)     Other studies reviewed: Additional studies/records reviewed today include: summarized above  ASSESSMENT AND PLAN:  1. Paroxysmal atrial flutter: She has spontaneously converted to sinus rhythm with a mildly bradycardic heart rate.  Continue amiodarone 400 mg twice daily for total of 5 days followed by 200 mg twice daily for 5 days followed by 200 mg daily thereafter.  Recent CMP and TSH from earlier this month were unremarkable.  She is not exhibiting any signs or symptoms of heart failure.  For now, we will defer echocardiogram.  Continue Eliquis 2.5 mg twice daily given that she meets reduced dosing criteria with age greater than 90 years and weight less than 60 kg.  2. Hypertension: Blood pressure typically is well controlled in the 275T systolic at home.  Patient indicates she heard in from the parking lot and did not have time to sit down and rest.  Recheck blood pressure was 143/72.  Given her blood pressures are well controlled at home she will continue to monitor these and contact our office if they are consistently 700 systolic or greater.  For now, continue losartan 50 mg daily.  Heart rate precludes addition of standing metoprolol at this time.  It is also noted that the patient previously did not tolerate standing metoprolol secondary to fatigue.  3. Medication management: Long discussion with patient regarding guidelines that dictate Eliquis dosing.  Patient is reassured following this discussion.  Disposition: F/u with Dr. Rockey Situ or an APP in 3 months.  Current medicines are reviewed at length with the patient today.  The patient did not have any concerns regarding medicines.  Signed, Christell Faith, PA-C 08/30/2018 11:11 AM     [address], [place]  [id number] [phone]

## 2018-08-29 NOTE — Patient Instructions (Addendum)
Saline nasal spray - flush nose at least 2-3x/day  nasacort nasal spray - 2 sprays each nostril one time per day.  Do this in the evening.    Take two amiodarone (200mg ) - twice a day for 5 days and then one tablet twice a day  Take metoprolol this pm for increased heart rate.    appt tomorrow 08/29/18 - 11:00 - Cardiology

## 2018-08-29 NOTE — Progress Notes (Signed)
Patient ID: Anita Benjamin, female   DOB: [birth year], 84 y.o.   MRN: [id number]   Subjective:    Patient ID: Anita Benjamin, female    DOB: Feb 13, 1934, 84 y.o.   MRN: [id number]  HPI  Patient here for a scheduled follow up.  She reports she has been doing relatively well.  Hs noticed recently some increased nasal congestion, stuffy nose and scratchy throat.  No chest congestion.  No wheezing.  No increased sob.  No chest pain.  Has noticed some increased heart racing intermittently.  States symptoms started at 7:00 am this morning.  Took metoprolol - 7am.  Symptoms have been persistent.  Denies any light headedness or dizziness.  No headache.  No chest pain.  Just feels is racing and feels palpitations.  No acid reflux.  No abdominal pain.  No nausea or vomiting.  Bowels moving.  Off amiodarone.  Caused fatigue.  Only takes metoprolol prn.  Describes intermittent episodes - may occur once every week or two.     Past Medical History:  Diagnosis Date  . Anemia   . Atrial flutter ([place])   . Fibrocystic breast disease   . History of chicken pox   . Hypercholesterolemia   . Hypertension    Past Surgical History:  Procedure Laterality Date  . BREAST BIOPSY Right 2014   NEG  . Laser repair of torn retina     Family History  Problem Relation Age of Onset  . Heart disease Mother 4       Heart attack  . Cancer Father 30       lung cancer  . Breast cancer Paternal Aunt        x3  . Colon cancer Neg Hx    Social History   Socioeconomic History  . Marital status: Widowed    Spouse name: Not on file  . Number of children: 2  . Years of education: Not on file  . Highest education level: Not on file  Occupational History  . Not on file  Social Needs  . Financial resource strain: Not hard at all  . Food insecurity:    Worry: Never true    Inability: Never true  . Transportation needs:    Medical: No    Non-medical: No  Tobacco Use  . Smoking status: Never Smoker  . Smokeless tobacco:  Never Used  Substance and Sexual Activity  . Alcohol use: No    Alcohol/week: 0.0 standard drinks  . Drug use: No  . Sexual activity: Not on file  Lifestyle  . Physical activity:    Days per week: 0 days    Minutes per session: Not on file  . Stress: Not at all  Relationships  . Social connections:    Talks on phone: Not on file    Gets together: Not on file    Attends religious service: Not on file    Active member of club or organization: Not on file    Attends meetings of clubs or organizations: Not on file    Relationship status: Not on file  Other Topics Concern  . Not on file  Social History Narrative  . Not on file    Outpatient Encounter Medications as of 08/29/2018  Medication Sig  . amiodarone (PACERONE) 200 MG tablet Take 1 tablet (200 mg total) by mouth 2 (two) times daily.  Marland Kitchen apixaban (ELIQUIS) 2.5 MG TABS tablet Take 1 tablet (2.5 mg total) by mouth 2 (two) times daily.  Marland Kitchen  Ascorbic Acid (VITAMIN C) 250 MG CHEW Chew by mouth daily.  . calcium citrate-vitamin D 500-500 MG-UNIT chewable tablet Chew 1 tablet by mouth daily.  Marland Kitchen losartan (COZAAR) 50 MG tablet TAKE 1 TABLET BY MOUTH ONCE DAILY  . metoprolol succinate (TOPROL-XL) 25 MG 24 hr tablet Take 1 tablet (25 mg total) by mouth daily as needed.  . Multiple Vitamin (MULTIVITAMIN) tablet Take 1 tablet by mouth daily.  . NON FORMULARY ViaActiv Takes 1 tablet by mouth daily.  . Selenium (SELENICAPS-200 PO) Take 200 mg by mouth daily.  . sertraline (ZOLOFT) 50 MG tablet Take 0.5 tablets (25 mg total) by mouth at bedtime.   No facility-administered encounter medications on file as of 08/29/2018.     Review of Systems  Constitutional: Negative for appetite change and unexpected weight change.  HENT: Positive for congestion and postnasal drip. Negative for sinus pressure.   Respiratory: Negative for chest tightness and shortness of breath.        Minimal cough.   Cardiovascular: Positive for palpitations. Negative  for chest pain and leg swelling.       Increased heart racing as outlined.    Gastrointestinal: Negative for abdominal pain, diarrhea, nausea and vomiting.  Genitourinary: Negative for difficulty urinating and dysuria.  Musculoskeletal: Negative for joint swelling and myalgias.  Skin: Negative for color change and rash.  Neurological: Negative for dizziness, light-headedness and headaches.  Psychiatric/Behavioral: Negative for agitation and dysphoric mood.       Objective:     Blood pressure rechecked by me:  124/8  Physical Exam  Constitutional: She appears well-developed and well-nourished. No distress.  HENT:  Nose: Nose normal.  Mouth/Throat: Oropharynx is clear and moist.  Neck: Neck supple. No thyromegaly present.  Cardiovascular:  Increased heart rate.  Initially appeared to be irregular.  Repeat exam - more regular with rate 120s.    Pulmonary/Chest: Breath sounds normal. No respiratory distress. She has no wheezes.  Abdominal: Soft. Bowel sounds are normal. There is no tenderness.  Musculoskeletal: She exhibits no edema or tenderness.  Lymphadenopathy:    She has no cervical adenopathy.  Skin: No rash noted. No erythema.  Psychiatric: She has a normal mood and affect. Her behavior is normal.    BP 112/78 (BP Location: Left Arm, Patient Position: Sitting, Cuff Size: Normal)   Pulse (!) 140   Temp 97.9 F (36.6 C) (Oral)   Resp 18   Wt 130 lb 6.4 oz (59.1 kg)   LMP 10/27/1989   SpO2 98%   BMI 20.42 kg/m  Wt Readings from Last 3 Encounters:  08/29/18 130 lb 6.4 oz (59.1 kg)  04/25/18 129 lb 12.8 oz (58.9 kg)  02/13/18 131 lb 12.8 oz (59.8 kg)     Lab Results  Component Value Date   WBC 4.0 08/21/2018   HGB 14.1 08/21/2018   HCT 41.9 08/21/2018   PLT 253.0 08/21/2018   GLUCOSE 96 08/21/2018   CHOL 180 08/21/2018   TRIG 79.0 08/21/2018   HDL 66.90 08/21/2018   LDLDIRECT 117.7 11/06/2013   LDLCALC 97 08/21/2018   ALT 15 08/21/2018   AST 18 08/21/2018     NA 141 08/21/2018   K 4.6 08/21/2018   CL 105 08/21/2018   CREATININE 0.98 08/21/2018   BUN 21 08/21/2018   CO2 30 08/21/2018   TSH 3.44 08/21/2018    Mm 3d Screen Breast Bilateral  Result Date: 04/07/2018 CLINICAL DATA:  Screening. EXAM: DIGITAL SCREENING BILATERAL MAMMOGRAM WITH TOMO AND CAD  COMPARISON:  Previous exam(s). ACR Breast Density Category b: There are scattered areas of fibroglandular density. FINDINGS: There are no findings suspicious for malignancy. Images were processed with CAD. IMPRESSION: No mammographic evidence of malignancy. A result letter of this screening mammogram will be mailed directly to the patient. RECOMMENDATION: Screening mammogram in one year. (Code:SM-B-01Y) BI-RADS CATEGORY  1: Negative. Electronically Signed   By: Lillia Mountain M.D.   On: 04/07/2018 12:05       Assessment & Plan:   Problem List Items Addressed This Visit    Atrial flutter ([place])    Has a history of atrial flutter.  Off amiodarone - intolerance.  Only takes metoprolol prn.  Presents with increased heart rate as outlined.  EKG - question of atrial flutter.  Ventricular rate 120s.  Has been in this rhythm since early am.  Took metoprolol this am.  Persistent.  BP ok.  Discussed with cardiology.  Will place her back on amiodarone.  Start 400mg  bid x 5 days and then continue 200mg  bid. She will take metoprolol this pm.  Cardiology appt tomorrow am.  Discussed with pt.  She is comfortable with this plan.  If persistent or worsening symptoms, she is to be evaluated.  Recent labs including thyroid and electrolytes wnl.       Relevant Medications   amiodarone (PACERONE) 200 MG tablet   Essential hypertension    Blood pressure under good control.  Continue same medication regimen.  Follow pressures.  Follow metabolic panel.        Relevant Medications   amiodarone (PACERONE) 200 MG tablet   Hypercholesterolemia    Off crestor.  Desires not to restart.  Discussed labs.  Follow lipid panel.         Relevant Medications   amiodarone (PACERONE) 200 MG tablet   Stress    On zoloft.  Appears to be doing well.  Follow.        Tachycardia - Primary    Increased heart rate as outlined.  See above.  EKG as outlined.  Question of atrial flutter.  Ventricular rate 120s.  Discussed with cardiology.  Restart amiodarone as outlined.  F/u appt with cardiology tomorrow am.        Relevant Orders   EKG 12-Lead (Completed)    Other Visit Diagnoses    Nasal congestion       Treat with saline nasal spray and nasacort nasal spray as directed.  notify if symptoms worsen.        I spent 40 minutes with the patient and more than 50% of the time was spent in consultation regarding the above.  Time spent discussing her current symptoms and concerns.  Time also spent discussing treatment options and plans for further evaluation and f/u with cardiology.     Einar Pheasant, MD

## 2018-08-30 ENCOUNTER — Ambulatory Visit (INDEPENDENT_AMBULATORY_CARE_PROVIDER_SITE_OTHER): Payer: Medicare Other | Admitting: Physician Assistant

## 2018-08-30 ENCOUNTER — Encounter: Payer: Self-pay | Admitting: Physician Assistant

## 2018-08-30 VITALS — BP 168/68 | HR 59 | Ht 67.0 in | Wt 129.5 lb

## 2018-08-30 DIAGNOSIS — I483 Typical atrial flutter: Secondary | ICD-10-CM

## 2018-08-30 MED ORDER — AMIODARONE HCL 200 MG PO TABS: ORAL_TABLET | ORAL | 3 refills | Status: DC

## 2018-08-30 NOTE — Patient Instructions (Addendum)
Medication Instructions:  - Your physician has recommended you make the following change in your medication:   1) Amiodarone 200 mg- take 2 tablets (400 mg) by mouth twice daily x 5 days (thru Saturday 11/30), then  2) decrease amiodarone 200 mg- take 1 tablet (200 mg) by mouth twice daily x 5 days (thru Thursday 12/5), then  3) decrease amiodarone 200 mg- take 1 tablet (200 mg) by mouth once daily       (we will send in a new prescription for this)  If you need a refill on your cardiac medications before your next appointment, please call your pharmacy.   Lab work: - none ordered  If you have labs (blood work) drawn today and your tests are completely normal, you will receive your results only by: Marland Kitchen MyChart Message (if you have MyChart) OR . A paper copy in the mail If you have any lab test that is abnormal or we need to change your treatment, we will call you to review the results.  Testing/Procedures: - none ordered  Follow-Up: At [facility], you and your health needs are our priority.  As part of our continuing mission to provide you with exceptional heart care, we have created designated Provider Care Teams.  These Care Teams include your primary Cardiologist (physician) and Advanced Practice Providers (APPs -  Physician Assistants and Nurse Practitioners) who all work together to provide you with the care you need, when you need it. . 3 months wtih Dr. Rockey Situ APP  Any Other Special Instructions Will Be Listed Below (If Applicable). - N/A

## 2018-08-30 NOTE — Assessment & Plan Note (Signed)
Increased heart rate as outlined.  See above.  EKG as outlined.  Question of atrial flutter.  Ventricular rate 120s.  Discussed with cardiology.  Restart amiodarone as outlined.  F/u appt with cardiology tomorrow am.

## 2018-08-30 NOTE — Assessment & Plan Note (Signed)
Off crestor.  Desires not to restart.  Discussed labs.  Follow lipid panel.

## 2018-08-30 NOTE — Assessment & Plan Note (Signed)
On zoloft.  Appears to be doing well.  Follow.  

## 2018-08-30 NOTE — Assessment & Plan Note (Signed)
Blood pressure under good control.  Continue same medication regimen.  Follow pressures.  Follow metabolic panel.   

## 2018-08-30 NOTE — Assessment & Plan Note (Signed)
Has a history of atrial flutter.  Off amiodarone - intolerance.  Only takes metoprolol prn.  Presents with increased heart rate as outlined.  EKG - question of atrial flutter.  Ventricular rate 120s.  Has been in this rhythm since early am.  Took metoprolol this am.  Persistent.  BP ok.  Discussed with cardiology.  Will place her back on amiodarone.  Start 400mg  bid x 5 days and then continue 200mg  bid. She will take metoprolol this pm.  Cardiology appt tomorrow am.  Discussed with pt.  She is comfortable with this plan.  If persistent or worsening symptoms, she is to be evaluated.  Recent labs including thyroid and electrolytes wnl.

## 2018-09-14 ENCOUNTER — Ambulatory Visit (INDEPENDENT_AMBULATORY_CARE_PROVIDER_SITE_OTHER): Payer: Medicare Other

## 2018-09-14 DIAGNOSIS — Z23 Encounter for immunization: Secondary | ICD-10-CM | POA: Diagnosis not present

## 2018-09-14 NOTE — Progress Notes (Signed)
Pt was seen today for high dose flu shot given Im in the LD. Pt tolerated well.

## 2018-10-14 ENCOUNTER — Other Ambulatory Visit: Payer: Self-pay | Admitting: Internal Medicine

## 2018-10-20 ENCOUNTER — Ambulatory Visit: Payer: Medicare Other | Admitting: Internal Medicine

## 2018-10-20 ENCOUNTER — Encounter: Payer: Self-pay | Admitting: Internal Medicine

## 2018-10-20 DIAGNOSIS — I483 Typical atrial flutter: Secondary | ICD-10-CM | POA: Diagnosis not present

## 2018-10-20 DIAGNOSIS — R945 Abnormal results of liver function studies: Secondary | ICD-10-CM | POA: Diagnosis not present

## 2018-10-20 DIAGNOSIS — M25552 Pain in left hip: Secondary | ICD-10-CM

## 2018-10-20 DIAGNOSIS — I10 Essential (primary) hypertension: Secondary | ICD-10-CM

## 2018-10-20 DIAGNOSIS — E7800 Pure hypercholesterolemia, unspecified: Secondary | ICD-10-CM | POA: Diagnosis not present

## 2018-10-20 DIAGNOSIS — R7989 Other specified abnormal findings of blood chemistry: Secondary | ICD-10-CM

## 2018-10-20 DIAGNOSIS — F439 Reaction to severe stress, unspecified: Secondary | ICD-10-CM

## 2018-10-20 NOTE — Progress Notes (Signed)
Patient ID: Anita Benjamin, female   DOB: 01/03/1934, 85 y.o.   MRN: [id number]   Subjective:    Patient ID: Anita Benjamin, female    DOB: 1934/08/20, 85 y.o.   MRN: [id number]  HPI  Patient here for a scheduled follow up.  She reports she is doing relatively well.  Increased stress recently.  Feels this is elevating her blood pressure.  States she is getting things worked out now and feels her blood pressure will continue to level out.  Is better at home.  Taking zoloft.  Back on one per day.  Tries to stay active.  No chest pain.  No sob.  No acid reflux.  No abdominal pain.  Bowels moving.  Does report left leg pain.  Has been present for a few months.  Wakes up with pain.  Moving - better.  When first gets up - sore.  Left lateral leg - to knee.  Doing well on amiodarone.  Heart doing well.  No chest pain.  No sob.  No acid reflux.  No abdominal pain.  Bowels moving.  No urine change.     Past Medical History:  Diagnosis Date  . Anemia   . Atrial flutter ([place])   . Fibrocystic breast disease   . History of chicken pox   . Hypercholesterolemia   . Hypertension    Past Surgical History:  Procedure Laterality Date  . BREAST BIOPSY Right 2014   NEG  . Laser repair of torn retina     Family History  Problem Relation Age of Onset  . Heart disease Mother 46       Heart attack  . Cancer Father 93       lung cancer  . Breast cancer Paternal Aunt        x3  . Colon cancer Neg Hx    Social History   Socioeconomic History  . Marital status: Widowed    Spouse name: Not on file  . Number of children: 2  . Years of education: Not on file  . Highest education level: Not on file  Occupational History  . Not on file  Social Needs  . Financial resource strain: Not hard at all  . Food insecurity:    Worry: Never true    Inability: Never true  . Transportation needs:    Medical: No    Non-medical: No  Tobacco Use  . Smoking status: Never Smoker  . Smokeless tobacco: Never Used  Substance  and Sexual Activity  . Alcohol use: No    Alcohol/week: 0.0 standard drinks  . Drug use: No  . Sexual activity: Not on file  Lifestyle  . Physical activity:    Days per week: 0 days    Minutes per session: Not on file  . Stress: Not at all  Relationships  . Social connections:    Talks on phone: Not on file    Gets together: Not on file    Attends religious service: Not on file    Active member of club or organization: Not on file    Attends meetings of clubs or organizations: Not on file    Relationship status: Not on file  Other Topics Concern  . Not on file  Social History Narrative  . Not on file    Outpatient Encounter Medications as of 10/20/2018  Medication Sig  . amiodarone (PACERONE) 200 MG tablet Take 1 tablet (200 mg) by mouth once daily  . apixaban (ELIQUIS) 2.5 MG  TABS tablet Take 1 tablet (2.5 mg total) by mouth 2 (two) times daily.  . Ascorbic Acid (VITAMIN C) 250 MG CHEW Chew by mouth daily.  . calcium citrate-vitamin D 500-500 MG-UNIT chewable tablet Chew 1 tablet by mouth daily.  Marland Kitchen losartan (COZAAR) 50 MG tablet TAKE 1 TABLET BY MOUTH ONCE DAILY  . metoprolol succinate (TOPROL-XL) 25 MG 24 hr tablet Take 1 tablet (25 mg total) by mouth daily as needed.  . Multiple Vitamin (MULTIVITAMIN) tablet Take 1 tablet by mouth daily.  . NON FORMULARY ViaActiv Takes 1 tablet by mouth daily.  . Selenium (SELENICAPS-200 PO) Take 200 mg by mouth daily.  . sertraline (ZOLOFT) 50 MG tablet Take 0.5 tablets (25 mg total) by mouth at bedtime.   No facility-administered encounter medications on file as of 10/20/2018.     Review of Systems  Constitutional: Negative for appetite change and unexpected weight change.  HENT: Negative for congestion and sinus pressure.   Respiratory: Negative for cough, chest tightness and shortness of breath.   Cardiovascular: Negative for chest pain, palpitations and leg swelling.  Gastrointestinal: Negative for abdominal pain, diarrhea, nausea  and vomiting.  Genitourinary: Negative for difficulty urinating and dysuria.  Musculoskeletal: Negative for myalgias.       Left leg pain as outlined.    Skin: Negative for color change and rash.  Neurological: Negative for dizziness, light-headedness and headaches.  Psychiatric/Behavioral: Negative for agitation and dysphoric mood.       Objective:    Physical Exam Constitutional:      General: She is not in acute distress.    Appearance: Normal appearance.  HENT:     Nose: Nose normal. No congestion.     Mouth/Throat:     Pharynx: No oropharyngeal exudate or posterior oropharyngeal erythema.  Neck:     Musculoskeletal: Neck supple. No muscular tenderness.     Thyroid: No thyromegaly.  Cardiovascular:     Rate and Rhythm: Normal rate and regular rhythm.  Pulmonary:     Effort: No respiratory distress.     Breath sounds: Normal breath sounds. No wheezing.  Abdominal:     General: Bowel sounds are normal.     Palpations: Abdomen is soft.     Tenderness: There is no abdominal tenderness.  Musculoskeletal:        General: No swelling or tenderness.  Lymphadenopathy:     Cervical: No cervical adenopathy.  Skin:    Findings: No erythema or rash.  Neurological:     Mental Status: She is alert.  Psychiatric:        Mood and Affect: Mood normal.        Behavior: Behavior normal.     BP (!) 152/80   Pulse 60   Temp 98.3 F (36.8 C) (Oral)   Resp 18   Wt 129 lb 12.8 oz (58.9 kg)   LMP 10/27/1989   SpO2 99%   BMI 20.33 kg/m  Wt Readings from Last 3 Encounters:  10/20/18 129 lb 12.8 oz (58.9 kg)  08/30/18 129 lb 8 oz (58.7 kg)  08/29/18 130 lb 6.4 oz (59.1 kg)     Lab Results  Component Value Date   WBC 4.0 08/21/2018   HGB 14.1 08/21/2018   HCT 41.9 08/21/2018   PLT 253.0 08/21/2018   GLUCOSE 96 08/21/2018   CHOL 180 08/21/2018   TRIG 79.0 08/21/2018   HDL 66.90 08/21/2018   LDLDIRECT 117.7 11/06/2013   LDLCALC 97 08/21/2018   ALT 15 08/21/2018  AST  18 08/21/2018   NA 141 08/21/2018   K 4.6 08/21/2018   CL 105 08/21/2018   CREATININE 0.98 08/21/2018   BUN 21 08/21/2018   CO2 30 08/21/2018   TSH 3.44 08/21/2018    Mm 3d Screen Breast Bilateral  Result Date: 04/07/2018 CLINICAL DATA:  Screening. EXAM: DIGITAL SCREENING BILATERAL MAMMOGRAM WITH TOMO AND CAD COMPARISON:  Previous exam(s). ACR Breast Density Category b: There are scattered areas of fibroglandular density. FINDINGS: There are no findings suspicious for malignancy. Images were processed with CAD. IMPRESSION: No mammographic evidence of malignancy. A result letter of this screening mammogram will be mailed directly to the patient. RECOMMENDATION: Screening mammogram in one year. (Code:SM-B-01Y) BI-RADS CATEGORY  1: Negative. Electronically Signed   By: Lillia Mountain M.D.   On: 04/07/2018 12:05       Assessment & Plan:   Problem List Items Addressed This Visit    Abnormal liver function test    Follow liver panel.        Atrial flutter ([facility])    Back on amiodarone.  Followed by cardiology.  Doing better on amiodarone.  No increased heart rate or palpitations.  Follow.        Essential hypertension    Blood pressure elevated initially.  Recheck improved.  States better at home.  Hold on making changes in her medication.  She will spot check her pressure and send in readings over the next 2-3 weeks.  Follow metabolic panel.       Relevant Orders   Basic metabolic panel   Hypercholesterolemia    Off crestor.  Desires not to restart.  Follow lipid panel.        Relevant Orders   Hepatic function panel   Lipid panel   Left hip pain    Left leg pain as outlined.  Discussed further w/up.  She declines.  Stretches.  Tylenol.  Follow.  Will call with update.  If persistent pain, xray.        Stress    On zoloft.  Increased stress recently.  Discussed with her today.  States her situation is better now.  Follow.            Einar Pheasant, MD

## 2018-10-22 ENCOUNTER — Encounter: Payer: Self-pay | Admitting: Internal Medicine

## 2018-10-22 NOTE — Assessment & Plan Note (Signed)
Follow liver panel.  

## 2018-10-22 NOTE — Assessment & Plan Note (Signed)
Back on amiodarone.  Followed by cardiology.  Doing better on amiodarone.  No increased heart rate or palpitations.  Follow.

## 2018-10-22 NOTE — Assessment & Plan Note (Signed)
On zoloft.  Increased stress recently.  Discussed with her today.  States her situation is better now.  Follow.

## 2018-10-22 NOTE — Assessment & Plan Note (Signed)
Left leg pain as outlined.  Discussed further w/up.  She declines.  Stretches.  Tylenol.  Follow.  Will call with update.  If persistent pain, xray.

## 2018-10-22 NOTE — Assessment & Plan Note (Signed)
Off crestor.  Desires not to restart.  Follow lipid panel.   

## 2018-10-22 NOTE — Assessment & Plan Note (Signed)
Blood pressure elevated initially.  Recheck improved.  States better at home.  Hold on making changes in her medication.  She will spot check her pressure and send in readings over the next 2-3 weeks.  Follow metabolic panel.

## 2018-11-30 NOTE — Progress Notes (Signed)
Cardiology Office Note  Date:  12/04/2018   ID:  Anita Benjamin, DOB 03-10-1934, MRN [id number]  PCP:  Einar Pheasant, MD   Chief Complaint  Patient presents with  . OTHER    3 month f/u c/o elevated BP. Meds reviewed verbally with pt.    HPI:  Anita Benjamin is a very pleasant 85 y.o. woman, with a history of  tachycardia, SVT episodes,  atrial flutter, hypotension. several year history, possibly even up to 10 years of tachyarrhythmia. She presents today for follow-up of her tachycardia and chest pain  INTERVAL HISTORY:  The patient reports today for follow up. She is doing well overall. Denies having any fluttering sensations in her chest, concerning for atrial fibrillation  Her BP is usually around 130/80 at home, though no documentation of this. Reports this morning her BP was 160 at home home. She wants to monitor her BP at home before doing any medication changes. Has an appointment with Dr. Nicki Reaper later this month and will take her BP readings with her that day to see if any changes are needed.  Her weight is stable at this time. Reports that she is feeling more drowsy recently and is falling asleep during church service.   Denies leg swelling or other associated symptoms at this time.   Blood pressure 170/78 on today's visit Total Chol 180/ LDL 97 CR 0.98 Glucose 96  Recently seen November 2019 for atrial flutter, started on amiodarone, early later converted to normal sinus rhythm Metoprolol held for bradycardia Remains on amiodarone 200 mg daily  EKG personally reviewed by myself on todays visit Shows sinus bradycardia rhythm. 56 bpm.     OTHER PAST MEDICAL HISTORY REVIEWED BY ME FOR TODAY'S VISIT: 10/01/2016 she had atrial flutter rate 120 bpm  started on amiodarone, metoprolol, eliquis.  She converted back to normal sinus rhythm  Mother died at age 25, found down, etiology unclear   family history, father had lung cancer, possible heart issues, died at age  38. Mother had a stroke, died at 61  In terms of her social history, she is not a smoker with no smoking history, lives at home with her husband. Is retired Total cholesterol 211  PMH:   has a past medical history of Anemia, Atrial flutter ([place]), Fibrocystic breast disease, History of chicken pox, Hypercholesterolemia, and Hypertension.  PSH:    Past Surgical History:  Procedure Laterality Date  . BREAST BIOPSY Right 2014   NEG  . Laser repair of torn retina      Current Outpatient Medications  Medication Sig Dispense Refill  . Acetaminophen (TYLENOL ARTHRITIS PAIN PO) Take 1 tablet by mouth 2 (two) times daily.    Marland Kitchen amiodarone (PACERONE) 200 MG tablet Take 1 tablet (200 mg) by mouth once daily 30 tablet 3  . apixaban (ELIQUIS) 2.5 MG TABS tablet Take 1 tablet (2.5 mg total) by mouth 2 (two) times daily. 180 tablet 1  . Ascorbic Acid (VITAMIN C) 250 MG CHEW Chew by mouth daily.    . calcium citrate-vitamin D 500-500 MG-UNIT chewable tablet Chew 1 tablet by mouth daily.    Marland Kitchen losartan (COZAAR) 50 MG tablet TAKE 1 TABLET BY MOUTH ONCE DAILY 30 tablet 3  . metoprolol succinate (TOPROL-XL) 25 MG 24 hr tablet Take 1 tablet (25 mg total) by mouth daily as needed. 30 tablet 1  . Multiple Vitamin (MULTIVITAMIN) tablet Take 1 tablet by mouth daily.    . NON FORMULARY ViaActiv Takes 1 tablet by mouth  daily.    . Selenium (SELENICAPS-200 PO) Take 200 mg by mouth daily.    . sertraline (ZOLOFT) 50 MG tablet Take 0.5 tablets (25 mg total) by mouth at bedtime. 30 tablet 2   No current facility-administered medications for this visit.      Allergies:   Oruvail [ketoprofen]; Relafen [nabumetone]; and Timentin [ticarcillin-pot clavulanate]   Social History:  The patient  reports that she has never smoked. She has never used smokeless tobacco. She reports that she does not drink alcohol or use drugs.   Family History:   family history includes Breast cancer in her paternal aunt; Cancer (age of  onset: 45) in her father; Heart disease (age of onset: 79) in her mother.    Review of Systems: Review of Systems  Constitutional: Negative.   Eyes: Negative.   Respiratory: Negative.   Cardiovascular: Negative.  Negative for palpitations.  Gastrointestinal: Negative.   Genitourinary: Negative.   Musculoskeletal: Negative.   Neurological: Negative.   Psychiatric/Behavioral: Negative.   All other systems reviewed and are negative.    PHYSICAL EXAM: VS:  BP (!) 170/78 (BP Location: Left Arm, Patient Position: Sitting, Cuff Size: Normal)   Pulse (!) 56   Ht 5\' 7"  (1.702 m)   Wt 132 lb 8 oz (60.1 kg)   LMP 10/27/1989   BMI 20.75 kg/m  , BMI Body mass index is 20.75 kg/m.  Constitutional:  oriented to person, place, and time. No distress.  HENT:  Head: Grossly normal Eyes:  no discharge. No scleral icterus.  Neck: No JVD, no carotid bruits  Cardiovascular: Regular rate and rhythm, no murmurs appreciated  Pulmonary/Chest: Clear to auscultation bilaterally, no wheezes or rales Abdominal: Soft.  no distension.  no tenderness.  Musculoskeletal: Normal range of motion Neurological:  normal muscle tone. Coordination normal. No atrophy Skin: Skin warm and dry Psychiatric: normal affect, pleasant  Recent Labs: 08/21/2018: ALT 15; BUN 21; Creatinine, Ser 0.98; Hemoglobin 14.1; Platelets 253.0; Potassium 4.6; Sodium 141; TSH 3.44    Lipid Panel Lab Results  Component Value Date   CHOL 180 08/21/2018   HDL 66.90 08/21/2018   LDLCALC 97 08/21/2018   TRIG 79.0 08/21/2018      Wt Readings from Last 3 Encounters:  12/04/18 132 lb 8 oz (60.1 kg)  10/20/18 129 lb 12.8 oz (58.9 kg)  08/30/18 129 lb 8 oz (58.7 kg)       ASSESSMENT AND PLAN:  Essential hypertension Plan: EKG 12-Lead Typically has elevated blood pressure in the office Reports she has well-controlled numbers at home Recommend monitoring BP at home. Call with numbers. Correlate your cuff to another cuff. If  needed could increase losartan up to 100 daily  Typical atrial flutter (HCC) Plan: EKG 12-Lead She previously stopped metoprolol  Remains on amiodarone 200 daily Tolerating anticoagulation, Eliquis 2.5 twice daily given age and low weight  Chronic fatigue Plan: EKG 12-Lead Poor sleep hygiene  Chest pain Plan:Previous symptoms of chest pain, none recently No ischemic workup ordered We will continue to monitor  Encounter for anticoagulation discussion and counseling Plan: Tolerating anticoagulation, Eliquis   Total encounter time more than 25 minutes  Greater than 50% was spent in counseling and coordination of care with the patient   Disposition:   F/U  12 months   Orders Placed This Encounter  Procedures  . EKG 12-Lead   I, Jesus Reyes am acting as a scribe for Ida Rogue, M.D., Ph.D.  I, Ida Rogue, M.D. Ph.D., have reviewed the  above documentation for accuracy and completeness, and I agree with the above.    Signed, Esmond Plants, M.D., Ph.D. 12/04/2018  [place], [place]

## 2018-12-04 ENCOUNTER — Encounter: Payer: Self-pay | Admitting: Cardiovascular Disease

## 2018-12-04 ENCOUNTER — Ambulatory Visit (INDEPENDENT_AMBULATORY_CARE_PROVIDER_SITE_OTHER): Payer: Medicare Other | Admitting: Cardiovascular Disease

## 2018-12-04 VITALS — BP 170/78 | HR 56 | Ht 67.0 in | Wt 132.5 lb

## 2018-12-04 DIAGNOSIS — I483 Typical atrial flutter: Secondary | ICD-10-CM

## 2018-12-04 DIAGNOSIS — I10 Essential (primary) hypertension: Secondary | ICD-10-CM

## 2018-12-04 NOTE — Patient Instructions (Addendum)
Monitor blood pressure at home Call with numbers  Correlate your cuff to another cuff   Medication Instructions:  No changes  If you need a refill on your cardiac medications before your next appointment, please call your pharmacy.    Lab work: No new labs needed   If you have labs (blood work) drawn today and your tests are completely normal, you will receive your results only by: Marland Kitchen MyChart Message (if you have MyChart) OR . A paper copy in the mail If you have any lab test that is abnormal or we need to change your treatment, we will call you to review the results.   Testing/Procedures: No new testing needed   Follow-Up: At [facility], you and your health needs are our priority.  As part of our continuing mission to provide you with exceptional heart care, we have created designated Provider Care Teams.  These Care Teams include your primary Cardiologist (physician) and Advanced Practice Providers (APPs -  Physician Assistants and Nurse Practitioners) who all work together to provide you with the care you need, when you need it.  . You will need a follow up appointment in 12 months .   Please call our office 2 months in advance to schedule this appointment.    . Providers on your designated Care Team:   . Murray Hodgkins, NP . Christell Faith, PA-C . Marrianne Mood, PA-C  Any Other Special Instructions Will Be Listed Below (If Applicable).  For educational health videos Log in to : [email or web address] Or : [email or web address], password : [email or web address]

## 2018-12-21 ENCOUNTER — Other Ambulatory Visit: Payer: Self-pay | Admitting: Cardiovascular Disease

## 2018-12-21 NOTE — Telephone Encounter (Signed)
Please review for refill, Thanks !  

## 2018-12-29 ENCOUNTER — Ambulatory Visit: Payer: Medicare Other | Admitting: Internal Medicine

## 2019-01-12 ENCOUNTER — Other Ambulatory Visit: Payer: Self-pay | Admitting: Internal Medicine

## 2019-01-12 ENCOUNTER — Other Ambulatory Visit: Payer: Self-pay | Admitting: Physician Assistant

## 2019-02-21 ENCOUNTER — Other Ambulatory Visit: Payer: Self-pay

## 2019-02-21 ENCOUNTER — Encounter: Payer: Self-pay | Admitting: Family Medicine

## 2019-02-21 ENCOUNTER — Ambulatory Visit: Payer: Self-pay | Admitting: Internal Medicine

## 2019-02-21 ENCOUNTER — Ambulatory Visit (INDEPENDENT_AMBULATORY_CARE_PROVIDER_SITE_OTHER): Payer: Medicare Other | Admitting: Family Medicine

## 2019-02-21 DIAGNOSIS — R195 Other fecal abnormalities: Secondary | ICD-10-CM

## 2019-02-21 MED ORDER — LOPERAMIDE HCL 2 MG PO TABS: 2.0000 mg | ORAL_TABLET | Freq: Four times a day (QID) | ORAL | 0 refills | Status: DC | PRN

## 2019-02-21 MED ORDER — FIBER 625 MG PO TABS: 1.0000 | ORAL_TABLET | Freq: Every day | ORAL | 1 refills | Status: DC

## 2019-02-21 NOTE — Telephone Encounter (Signed)
Pt has appt with [facility] @ 11am

## 2019-02-21 NOTE — Telephone Encounter (Signed)
Pt. Has had diarrhea x 3 weeks. Has had different constancies - sticky, clay-like and watery. 3-4 daily. This  morning she had some incontinence. Requests a visit. Warm transfer to [facility] in the practice.  Answer Assessment - Initial Assessment Questions 1. DIARRHEA SEVERITY: "How bad is the diarrhea?" "How many extra stools have you had in the past 24 hours than normal?"    - NO DIARRHEA (SCALE 0)   - MILD (SCALE 1-3): Few loose or mushy BMs; increase of 1-3 stools over normal daily number of stools; mild increase in ostomy output.   -  MODERATE (SCALE 4-7): Increase of 4-6 stools daily over normal; moderate increase in ostomy output. * SEVERE (SCALE 8-10; OR 'WORST POSSIBLE'): Increase of 7 or more stools daily over normal; moderate increase in ostomy output; incontinence.     3 2. ONSET: "When did the diarrhea begin?"      3 weeks ago 3. BM CONSISTENCY: "How loose or watery is the diarrhea?"      Started being "sticky" and then clay and now watery 4. VOMITING: "Are you also vomiting?" If so, ask: "How many times in the past 24 hours?"      No 5. ABDOMINAL PAIN: "Are you having any abdominal pain?" If yes: "What does it feel like?" (e.g., crampy, dull, intermittent, constant)      No 6. ABDOMINAL PAIN SEVERITY: If present, ask: "How bad is the pain?"  (e.g., Scale 1-10; mild, moderate, or severe)   - MILD (1-3): doesn't interfere with normal activities, abdomen soft and not tender to touch    - MODERATE (4-7): interferes with normal activities or awakens from sleep, tender to touch    - SEVERE (8-10): excruciating pain, doubled over, unable to do any normal activities       No 7. ORAL INTAKE: If vomiting, "Have you been able to drink liquids?" "How much fluids have you had in the past 24 hours?"     Yes 8. HYDRATION: "Any signs of dehydration?" (e.g., dry mouth [not just dry lips], too weak to stand, dizziness, new weight loss) "When did you last urinate?"     No 9. EXPOSURE: "Have  you traveled to a foreign country recently?" "Have you been exposed to anyone with diarrhea?" "Could you have eaten any food that was spoiled?"     No 10. ANTIBIOTIC USE: "Are you taking antibiotics now or have you taken antibiotics in the past 2 months?"       No 11. OTHER SYMPTOMS: "Do you have any other symptoms?" (e.g., fever, blood in stool)       No 12. PREGNANCY: "Is there any chance you are pregnant?" "When was your last menstrual period?"       No  Protocols used: DIARRHEA-A-AH

## 2019-02-21 NOTE — Progress Notes (Signed)
Patient ID: Anita Benjamin, female   DOB: June 28, 1934, 85 y.o.   MRN: [id number]    Virtual Visit via phone Note  This visit type was conducted due to national recommendations for restrictions regarding the COVID-19 pandemic (e.g. social distancing).  This format is felt to be most appropriate for this patient at this time.  All issues noted in this document were discussed and addressed.  No physical exam was performed (except for noted visual exam findings with Video Visits).   I connected with [name] today at 11:00 AM EDT by a video enabled telemedicine application and verified that I am speaking with the correct person using two identifiers. Location patient: home Location provider: [place] Persons participating in the virtual visit: patient, provider  I discussed the limitations, risks, security and privacy concerns of performing an evaluation and management service by telephone and the availability of in person appointments. I also discussed with the patient that there may be a patient responsible charge related to this service. The patient expressed understanding and agreed to proceed.  HPI:  Patient and I connected via phone today due to complaint of diarrhea for 3 weeks.  Patient states she will have 1-3 soft or loose stools daily and this is been going on for about 3 weeks.  Patient has not traveled anywhere recently, and her daughter goes to the grocery store to get groceries.  They have not tried any new restaurants or new foods and have been doing a lot of cooking at home.  Patient has no abdominal pain, no fever or chills and no vomiting.  She has no urinary complaints.  No cough, shortness of breath or wheezing.  No chest pain.  Does not feel faint or dizzy.  She has been eating and drinking normally.  Cannot pinpoint any certain food or food group that seems to exacerbate her symptoms.  Patient states some of her stools are more soft and she will feel normal urge to go the  bathroom and have a slightly formed bowel movement, other times the urge to go the bathroom will come on very suddenly and she will have to move very quickly to make to the bathroom before she has a loose/watery stool. She has not taken anything OTC to help stools.    ROS: See pertinent positives and negatives per HPI.  Past Medical History:  Diagnosis Date  . Anemia   . Atrial flutter ([place])   . Fibrocystic breast disease   . History of chicken pox   . Hypercholesterolemia   . Hypertension     Past Surgical History:  Procedure Laterality Date  . BREAST BIOPSY Right 2014   NEG  . Laser repair of torn retina      Family History  Problem Relation Age of Onset  . Heart disease Mother 44       Heart attack  . Cancer Father 67       lung cancer  . Breast cancer Paternal Aunt        x3  . Colon cancer Neg Hx    Social History   Tobacco Use  . Smoking status: Never Smoker  . Smokeless tobacco: Never Used  Substance Use Topics  . Alcohol use: No    Alcohol/week: 0.0 standard drinks    Current Outpatient Medications:  .  Acetaminophen (TYLENOL ARTHRITIS PAIN PO), Take 1 tablet by mouth 2 (two) times daily., Disp: , Rfl:  .  amiodarone (PACERONE) 200 MG tablet, TAKE 1 TABLET BY  MOUTH ONCE DAILY, Disp: 30 tablet, Rfl: 10 .  Ascorbic Acid (VITAMIN C) 250 MG CHEW, Chew by mouth daily., Disp: , Rfl:  .  calcium citrate-vitamin D 500-500 MG-UNIT chewable tablet, Chew 1 tablet by mouth daily., Disp: , Rfl:  .  ELIQUIS 2.5 MG TABS tablet, TAKE 1 TABLET BY MOUTH TWICE DAILY, Disp: 180 tablet, Rfl: 1 .  losartan (COZAAR) 50 MG tablet, TAKE 1 TABLET BY MOUTH ONCE DAILY, Disp: 30 tablet, Rfl: 3 .  metoprolol succinate (TOPROL-XL) 25 MG 24 hr tablet, Take 1 tablet (25 mg total) by mouth daily as needed., Disp: 30 tablet, Rfl: 1 .  Multiple Vitamin (MULTIVITAMIN) tablet, Take 1 tablet by mouth daily., Disp: , Rfl:  .  NON FORMULARY, ViaActiv Takes 1 tablet by mouth daily., Disp: , Rfl:  .   Selenium (SELENICAPS-200 PO), Take 200 mg by mouth daily., Disp: , Rfl:  .  sertraline (ZOLOFT) 50 MG tablet, TAKE 1/2 TABLET BY MOUTH AT BEDTIME, Disp: 30 tablet, Rfl: 2 .  Calcium Polycarbophil (FIBER) 625 MG TABS, Take 1 tablet by mouth daily., Disp: 30 each, Rfl: 1 .  loperamide (IMODIUM A-D) 2 MG tablet, Take 1 tablet (2 mg total) by mouth 4 (four) times daily as needed for diarrhea or loose stools., Disp: 30 tablet, Rfl: 0  EXAM:  GENERAL: alert, oriented, sounds well and in no acute distress  NECK: normal movements of the head and neck  LUNGS: speaking in full sentences, no signs of respiratory distress, breathing rate appears normal, no obvious coughing,  SOB, gasping or wheezing  PSYCH/NEURO: pleasant and cooperative, no obvious depression or anxiety, speech and thought processing grossly intact  ASSESSMENT AND PLAN:  Discussed the following assessment and plan:  Loose stools - Plan: Calcium Polycarbophil (FIBER) 625 MG TABS, loperamide (IMODIUM A-D) 2 MG tablet  Patient sounds to be in no distress when speaking over the phone.  Her stools vary from soft to loose.  Does not appear to be infectious.  Could be related to something she has eaten or some spasming in the colon.  We will have patient begin a daily fiber tablet to see if this will help bulk up stool and use Imodium if needed for any loose stools.  Advised to drink clear liquids and eat bland foods over the next 48 to 72 hours such as water, Gatorade, chicken broth, Jell-O, plain toast, white rice, plain pasta, dry scrambled eggs and then slowly advance diet as tolerated.  Advised that if her loose stools do continue to persist, we can plan to have her come into the office for blood work for further investigation and potentially consider an antibiotic.   I discussed the assessment and treatment plan with the patient. The patient was provided an opportunity to ask questions and all were answered. The patient agreed with the  plan and demonstrated an understanding of the instructions.   The patient was advised to call back or seek an in-person evaluation if the symptoms worsen or if the condition fails to improve as anticipated.   She will call us next week with update on how she is feeling.   15 minutes spent on phone with patient discussing her symptoms, plan of care, foods/drinks to eat and medications to take to help symptoms.   Jodelle Green, FNP

## 2019-03-14 ENCOUNTER — Other Ambulatory Visit: Payer: Self-pay | Admitting: Internal Medicine

## 2019-04-11 ENCOUNTER — Encounter

## 2019-04-11 ENCOUNTER — Encounter: Payer: Self-pay | Admitting: Internal Medicine

## 2019-04-11 ENCOUNTER — Other Ambulatory Visit: Payer: Self-pay

## 2019-04-11 ENCOUNTER — Ambulatory Visit (INDEPENDENT_AMBULATORY_CARE_PROVIDER_SITE_OTHER): Payer: Medicare Other | Admitting: Internal Medicine

## 2019-04-11 DIAGNOSIS — E7800 Pure hypercholesterolemia, unspecified: Secondary | ICD-10-CM

## 2019-04-11 DIAGNOSIS — I10 Essential (primary) hypertension: Secondary | ICD-10-CM

## 2019-04-11 DIAGNOSIS — R197 Diarrhea, unspecified: Secondary | ICD-10-CM | POA: Insufficient documentation

## 2019-04-11 DIAGNOSIS — R945 Abnormal results of liver function studies: Secondary | ICD-10-CM | POA: Diagnosis not present

## 2019-04-11 DIAGNOSIS — Z1231 Encounter for screening mammogram for malignant neoplasm of breast: Secondary | ICD-10-CM | POA: Diagnosis not present

## 2019-04-11 DIAGNOSIS — I483 Typical atrial flutter: Secondary | ICD-10-CM

## 2019-04-11 DIAGNOSIS — R7989 Other specified abnormal findings of blood chemistry: Secondary | ICD-10-CM

## 2019-04-11 NOTE — Progress Notes (Signed)
Patient ID: Anita Benjamin, female   DOB: 1934-08-01, 85 y.o.   MRN: [id number]   Virtual Visit via telephone Note  This visit type was conducted due to national recommendations for restrictions regarding the COVID-19 pandemic (e.g. social distancing).  This format is felt to be most appropriate for this patient at this time.  All issues noted in this document were discussed and addressed.  No physical exam was performed (except for noted visual exam findings with Video Visits).   I connected with Anita Benjamin by telephone and verified that I am speaking with the correct person using two identifiers. Location patient: home Location provider: work Persons participating in the telephone visit: patient, provider  I discussed the limitations, risks, security and privacy concerns of performing an evaluation and management service by telephone and the availability of in person appointments. The patient expressed understanding and agreed to proceed.   Reason for visit: scheduled follow up  HPI: Was evaluated 02/21/19 Anita Benjamin) for loose stools.  Was instructed to eat bland foods and labs unrevealing.  She reports persistent problems with loose stools.  Has tried fiber.  No abdominal pain.  She is eating.  No clear trigger.  Occurring daily.  May have 2-3 stools per day.  Also reports feels throat closing - described as hoarse.  No breathing difficulty or sob.  Discussed trial of pepcid.  Taking a probiotic.  No blood.  Does report increased mucus in her stool.  Taking eliquis and amiodarone.  Heart doing well.  Denies any increased heart rate or palpitations.  Taking zoloft.  Handling stress.  Off crestor.  Reports blood pressure varying.  This am, blood pressure 133/78.     ROS: See pertinent positives and negatives per HPI.  Past Medical History:  Diagnosis Date  . Anemia   . Atrial flutter ([place])   . Fibrocystic breast disease   . History of chicken pox   . Hypercholesterolemia   . Hypertension      Past Surgical History:  Procedure Laterality Date  . BREAST BIOPSY Right 2014   NEG  . Laser repair of torn retina      Family History  Problem Relation Age of Onset  . Heart disease Mother 79       Heart attack  . Cancer Father 67       lung cancer  . Breast cancer Paternal Aunt        x3  . Colon cancer Neg Hx     SOCIAL HX: reviewed.    Current Outpatient Medications:  .  Acetaminophen (TYLENOL ARTHRITIS PAIN PO), Take 1 tablet by mouth 2 (two) times daily., Disp: , Rfl:  .  amiodarone (PACERONE) 200 MG tablet, TAKE 1 TABLET BY MOUTH ONCE DAILY, Disp: 30 tablet, Rfl: 10 .  Ascorbic Acid (VITAMIN C) 250 MG CHEW, Chew by mouth daily., Disp: , Rfl:  .  calcium citrate-vitamin D 500-500 MG-UNIT chewable tablet, Chew 1 tablet by mouth daily., Disp: , Rfl:  .  Calcium Polycarbophil (FIBER) 625 MG TABS, Take 1 tablet by mouth daily., Disp: 30 each, Rfl: 1 .  ELIQUIS 2.5 MG TABS tablet, TAKE 1 TABLET BY MOUTH TWICE DAILY, Disp: 180 tablet, Rfl: 1 .  loperamide (IMODIUM A-D) 2 MG tablet, Take 1 tablet (2 mg total) by mouth 4 (four) times daily as needed for diarrhea or loose stools., Disp: 30 tablet, Rfl: 0 .  losartan (COZAAR) 50 MG tablet, TAKE 1 TABLET BY MOUTH ONCE DAILY, Disp: 30 tablet,  Rfl: 3 .  metoprolol succinate (TOPROL-XL) 25 MG 24 hr tablet, Take 1 tablet (25 mg total) by mouth daily as needed., Disp: 30 tablet, Rfl: 1 .  Multiple Vitamin (MULTIVITAMIN) tablet, Take 1 tablet by mouth daily., Disp: , Rfl:  .  NON FORMULARY, ViaActiv Takes 1 tablet by mouth daily., Disp: , Rfl:  .  Selenium (SELENICAPS-200 PO), Take 200 mg by mouth daily., Disp: , Rfl:  .  sertraline (ZOLOFT) 50 MG tablet, TAKE 1/2 TABLET BY MOUTH AT BEDTIME, Disp: 30 tablet, Rfl: 2  EXAM:  VITALS per patient if applicable: 709/62  GENERAL: alert.  Sounds to be in no acute distress.  Answering questions appropriately.    PSYCH/NEURO: pleasant and cooperative, no obvious depression or anxiety,  speech and thought processing grossly intact  ASSESSMENT AND PLAN:  Discussed the following assessment and plan:  Atrial flutter On amiodarone.  Doing well.  No increased heart rate or palpitations.  Follow.  Seeing cardiology.   Diarrhea Persistent loose stool.  Discussed fiber and probiotics.  Given persistence, check stool studies.  Further w/up pending results.  Recent labs unrevealing.    Essential hypertension Blood pressure as outlined.  Continue current medication regimen.  Follow pressures.  Follow metabolic panel.   Hypercholesterolemia Off crestor.  Desires not to restart.  Follow lipid panel.      I discussed the assessment and treatment plan with the patient. The patient was provided an opportunity to ask questions and all were answered. The patient agreed with the plan and demonstrated an understanding of the instructions.   The patient was advised to call back or seek an in-person evaluation if the symptoms worsen or if the condition fails to improve as anticipated.  I provided 25 minutes of non-face-to-face time during this encounter.   Einar Pheasant, MD

## 2019-04-12 ENCOUNTER — Other Ambulatory Visit: Payer: Self-pay

## 2019-04-12 ENCOUNTER — Other Ambulatory Visit (INDEPENDENT_AMBULATORY_CARE_PROVIDER_SITE_OTHER): Payer: Medicare Other

## 2019-04-12 ENCOUNTER — Encounter: Payer: Self-pay | Admitting: Internal Medicine

## 2019-04-12 DIAGNOSIS — R945 Abnormal results of liver function studies: Secondary | ICD-10-CM | POA: Diagnosis not present

## 2019-04-12 DIAGNOSIS — I10 Essential (primary) hypertension: Secondary | ICD-10-CM

## 2019-04-12 DIAGNOSIS — R197 Diarrhea, unspecified: Secondary | ICD-10-CM | POA: Diagnosis not present

## 2019-04-12 DIAGNOSIS — R7989 Other specified abnormal findings of blood chemistry: Secondary | ICD-10-CM

## 2019-04-12 DIAGNOSIS — I483 Typical atrial flutter: Secondary | ICD-10-CM | POA: Diagnosis not present

## 2019-04-12 LAB — LIPID PANEL
Cholesterol: 204 mg/dL — ABNORMAL HIGH (ref 0–200)
HDL: 78.2 mg/dL (ref >39.00)
LDL Cholesterol: 107 mg/dL — ABNORMAL HIGH (ref 0–99)
NonHDL: 125.96
Total CHOL/HDL Ratio: 3
Triglycerides: 94 mg/dL (ref 0.0–149.0)
VLDL: 18.8 mg/dL (ref 0.0–40.0)

## 2019-04-12 LAB — CBC WITH DIFFERENTIAL/PLATELET
Basophils Absolute: 0.1 10*3/uL (ref 0.0–0.1)
Basophils Relative: 1.4 % (ref 0.0–3.0)
Eosinophils Absolute: 0.1 10*3/uL (ref 0.0–0.7)
Eosinophils Relative: 2.6 % (ref 0.0–5.0)
HCT: 41.3 % (ref 36.0–46.0)
Hemoglobin: 13.7 g/dL (ref 12.0–15.0)
Lymphocytes Relative: 16.8 % (ref 12.0–46.0)
Lymphs Abs: 0.9 10*3/uL (ref 0.7–4.0)
MCHC: 33.1 g/dL (ref 30.0–36.0)
MCV: 97 fl (ref 78.0–100.0)
Monocytes Absolute: 0.5 10*3/uL (ref 0.1–1.0)
Monocytes Relative: 9.3 % (ref 3.0–12.0)
Neutro Abs: 3.5 10*3/uL (ref 1.4–7.7)
Neutrophils Relative %: 69.9 % (ref 43.0–77.0)
Platelets: 267 10*3/uL (ref 150.0–400.0)
RBC: 4.26 Mil/uL (ref 3.87–5.11)
RDW: 14.1 % (ref 11.5–15.5)
WBC: 5.1 10*3/uL (ref 4.0–10.5)

## 2019-04-12 LAB — BASIC METABOLIC PANEL
BUN: 11 mg/dL (ref 6–23)
CO2: 30 mEq/L (ref 19–32)
Calcium: 9.4 mg/dL (ref 8.4–10.5)
Chloride: 104 mEq/L (ref 96–112)
Creatinine, Ser: 1.02 mg/dL (ref 0.40–1.20)
GFR: 51.74 mL/min — ABNORMAL LOW (ref >60.00)
Glucose, Bld: 93 mg/dL (ref 70–99)
Potassium: 4.3 mEq/L (ref 3.5–5.1)
Sodium: 143 mEq/L (ref 135–145)

## 2019-04-12 LAB — HEPATIC FUNCTION PANEL
ALT: 23 U/L (ref 0–35)
AST: 19 U/L (ref 0–37)
Albumin: 4.3 g/dL (ref 3.5–5.2)
Alkaline Phosphatase: 66 U/L (ref 39–117)
Bilirubin, Direct: 0.1 mg/dL (ref 0.0–0.3)
Total Bilirubin: 0.7 mg/dL (ref 0.2–1.2)
Total Protein: 6.7 g/dL (ref 6.0–8.3)

## 2019-04-12 LAB — TSH: TSH: 4.72 u[IU]/mL — ABNORMAL HIGH (ref 0.35–4.50)

## 2019-04-13 ENCOUNTER — Other Ambulatory Visit: Payer: Self-pay | Admitting: Internal Medicine

## 2019-04-13 DIAGNOSIS — R7989 Other specified abnormal findings of blood chemistry: Secondary | ICD-10-CM

## 2019-04-13 NOTE — Progress Notes (Signed)
Order placed for f/u lab.   

## 2019-04-15 NOTE — Assessment & Plan Note (Signed)
On amiodarone.  Doing well.  No increased heart rate or palpitations.  Follow.  Seeing cardiology.

## 2019-04-15 NOTE — Assessment & Plan Note (Signed)
Blood pressure as outlined.  Continue current medication regimen.  Follow pressures.  Follow metabolic panel.  

## 2019-04-15 NOTE — Assessment & Plan Note (Signed)
Off crestor.  Desires not to restart.  Follow lipid panel.

## 2019-04-15 NOTE — Assessment & Plan Note (Signed)
Persistent loose stool.  Discussed fiber and probiotics.  Given persistence, check stool studies.  Further w/up pending results.  Recent labs unrevealing.

## 2019-04-16 ENCOUNTER — Other Ambulatory Visit: Payer: Medicare Other

## 2019-04-16 ENCOUNTER — Other Ambulatory Visit: Payer: Self-pay

## 2019-04-16 DIAGNOSIS — R197 Diarrhea, unspecified: Secondary | ICD-10-CM

## 2019-04-19 LAB — GASTROINTESTINAL PATHOGEN PANEL PCR
C. difficile Tox A/B, PCR: NOT DETECTED
Campylobacter, PCR: NOT DETECTED
Cryptosporidium, PCR: NOT DETECTED
E coli (ETEC) LT/ST PCR: NOT DETECTED
E coli (STEC) stx1/stx2, PCR: NOT DETECTED
E coli 0157, PCR: NOT DETECTED
Giardia lamblia, PCR: NOT DETECTED
Norovirus, PCR: NOT DETECTED
Rotavirus A, PCR: NOT DETECTED
Salmonella, PCR: NOT DETECTED
Shigella, PCR: NOT DETECTED

## 2019-04-26 ENCOUNTER — Ambulatory Visit: Payer: Medicare Other | Admitting: Internal Medicine

## 2019-04-26 ENCOUNTER — Ambulatory Visit: Payer: Medicare Other

## 2019-05-23 ENCOUNTER — Other Ambulatory Visit: Payer: Self-pay

## 2019-05-23 ENCOUNTER — Ambulatory Visit (INDEPENDENT_AMBULATORY_CARE_PROVIDER_SITE_OTHER): Payer: Medicare Other | Admitting: Internal Medicine

## 2019-05-23 ENCOUNTER — Encounter: Payer: Self-pay | Admitting: Internal Medicine

## 2019-05-23 DIAGNOSIS — I10 Essential (primary) hypertension: Secondary | ICD-10-CM

## 2019-05-23 DIAGNOSIS — R7989 Other specified abnormal findings of blood chemistry: Secondary | ICD-10-CM

## 2019-05-23 DIAGNOSIS — E7800 Pure hypercholesterolemia, unspecified: Secondary | ICD-10-CM

## 2019-05-23 DIAGNOSIS — R109 Unspecified abdominal pain: Secondary | ICD-10-CM

## 2019-05-23 DIAGNOSIS — I483 Typical atrial flutter: Secondary | ICD-10-CM | POA: Diagnosis not present

## 2019-05-23 DIAGNOSIS — R55 Syncope and collapse: Secondary | ICD-10-CM

## 2019-05-23 DIAGNOSIS — R945 Abnormal results of liver function studies: Secondary | ICD-10-CM

## 2019-05-23 MED ORDER — LOSARTAN POTASSIUM 100 MG PO TABS: 100.0000 mg | ORAL_TABLET | Freq: Every day | ORAL | 2 refills | Status: DC

## 2019-05-23 NOTE — Progress Notes (Signed)
Patient ID: Anita Benjamin, female   DOB: 1934-07-24, 85 y.o.   MRN: [id number]   Virtual Visit via telephone Note  This visit type was conducted due to national recommendations for restrictions regarding the COVID-19 pandemic (e.g. social distancing).  This format is felt to be most appropriate for this patient at this time.  All issues noted in this document were discussed and addressed.  No physical exam was performed (except for noted visual exam findings with Video Visits).   I connected with [name] by telephone and verified that I am speaking with the correct person using two identifiers. Location patient: home Location provider: work  Persons participating in the telephone visit: patient, provider  I discussed the limitations, risks, security and privacy concerns of performing an evaluation and management service by telephone and the availability of in person appointments. The patient expressed understanding and agreed to proceed.   Reason for visit: scheduled follow up.   HPI: Has a history of tachycardia, SVT episodes, atrial flutter, hypotension - sees cardiology.  Reports she has been doing relatively well.  Did have an episode several days ago where she blacked out.  No head injury.  States she ate a spicy sandwich and started having stomach pain.  Subsequently developed diarrhea and nausea.  States her head started spinning.  Stood up and passed out.  Denies any injury, including head injury.  States noticed some stomach pain - lower abdomen.  Denies any diarrhea now.  Does report some soft stool.  No nausea or vomiting now.  Blood pressure has been elevated.  Recent readings varying.  Blood pressure yesterday 208/88 (this high only on one check).  Remainder:  140s-190/70-80s.  Denies any chest pain.  No sob.  Feeling better now.     ROS: See pertinent positives and negatives per HPI.  Past Medical History:  Diagnosis Date  . Anemia   . Atrial flutter ([place])   . Fibrocystic  breast disease   . History of chicken pox   . Hypercholesterolemia   . Hypertension     Past Surgical History:  Procedure Laterality Date  . BREAST BIOPSY Right 2014   NEG  . Laser repair of torn retina      Family History  Problem Relation Age of Onset  . Heart disease Mother 22       Heart attack  . Cancer Father 46       lung cancer  . Breast cancer Paternal Aunt        x3  . Colon cancer Neg Hx     SOCIAL HX: reviewed.    Current Outpatient Medications:  .  famotidine (PEPCID) 20 MG tablet, Take 20 mg by mouth daily., Disp: , Rfl:  .  Acetaminophen (TYLENOL ARTHRITIS PAIN PO), Take 1 tablet by mouth 2 (two) times daily., Disp: , Rfl:  .  amiodarone (PACERONE) 200 MG tablet, TAKE 1 TABLET BY MOUTH ONCE DAILY, Disp: 30 tablet, Rfl: 10 .  Ascorbic Acid (VITAMIN C) 250 MG CHEW, Chew by mouth daily., Disp: , Rfl:  .  calcium citrate-vitamin D 500-500 MG-UNIT chewable tablet, Chew 1 tablet by mouth daily., Disp: , Rfl:  .  Calcium Polycarbophil (FIBER) 625 MG TABS, Take 1 tablet by mouth daily., Disp: 30 each, Rfl: 1 .  ELIQUIS 2.5 MG TABS tablet, TAKE 1 TABLET BY MOUTH TWICE DAILY, Disp: 180 tablet, Rfl: 1 .  loperamide (IMODIUM A-D) 2 MG tablet, Take 1 tablet (2 mg total) by mouth 4 (four)  times daily as needed for diarrhea or loose stools., Disp: 30 tablet, Rfl: 0 .  losartan (COZAAR) 100 MG tablet, Take 1 tablet (100 mg total) by mouth daily., Disp: 30 tablet, Rfl: 2 .  metoprolol succinate (TOPROL-XL) 25 MG 24 hr tablet, Take 1 tablet (25 mg total) by mouth daily as needed., Disp: 30 tablet, Rfl: 1 .  Multiple Vitamin (MULTIVITAMIN) tablet, Take 1 tablet by mouth daily., Disp: , Rfl:  .  NON FORMULARY, ViaActiv Takes 1 tablet by mouth daily., Disp: , Rfl:  .  Selenium (SELENICAPS-200 PO), Take 200 mg by mouth daily., Disp: , Rfl:  .  sertraline (ZOLOFT) 50 MG tablet, TAKE 1/2 TABLET BY MOUTH AT BEDTIME, Disp: 30 tablet, Rfl: 2  EXAM:  VITALS per patient if applicable:  this am 831/51, recheck:  166/83.    GENERAL: alert. Sounds to be in no acute distress.  Answering questions appropriately.     PSYCH/NEURO: pleasant and cooperative, no obvious depression or anxiety, speech and thought processing grossly intact  ASSESSMENT AND PLAN:  Discussed the following assessment and plan:  Hypercholesterolemia Previous trial of crestor.  Desires not to restart.  Follow lipid panel.    Abdominal discomfort Abdominal discomfort - lower abdomen as outlined. Previous nausea and diarrhea - with this episode.  On pepcid.  Question if syncopal episode - vasovagal.  Feeling better now.  Discussed further w/up including abdominal ultrasound, CT, etc.  Pursue cardiology w/up.    Abnormal liver function test Follow liver panel.   Atrial flutter On amiodarone.  Has metoprolol to take prn.  Blood pressure elevated.  Treat as outlined.  Recent syncopal episode.  Unclear etiology.  Given associated with abdominal pain, nausea and diarrhea, question if vasovagal episode.  No pain now.  No syncope or near syncope since.  Discussed f/u with cardiology for further evaluation.   Essential hypertension Blood pressure elevated as outlined.  Increase losartan to 100mg  q day.  Follow pressures.  Follow metabolic panel.   Syncope Had the recent syncopal episode as outlined.  Unclear etiology.  Given association with abdominal pain, nausea and diarrhea, question of vasovagal syncope.  Eating.  Discussed staying hydrated.  Will have cardiology evaluate.      I discussed the assessment and treatment plan with the patient. The patient was provided an opportunity to ask questions and all were answered. The patient agreed with the plan and demonstrated an understanding of the instructions.   The patient was advised to call back or seek an in-person evaluation if the symptoms worsen or if the condition fails to improve as anticipated.  I provided 25 minutes of non-face-to-face time during  this encounter.   Einar Pheasant, MD

## 2019-05-27 NOTE — Assessment & Plan Note (Signed)
Abdominal discomfort - lower abdomen as outlined. Previous nausea and diarrhea - with this episode.  On pepcid.  Question if syncopal episode - vasovagal.  Feeling better now.  Discussed further w/up including abdominal ultrasound, CT, etc.  Pursue cardiology w/up.

## 2019-05-27 NOTE — Assessment & Plan Note (Signed)
Had the recent syncopal episode as outlined.  Unclear etiology.  Given association with abdominal pain, nausea and diarrhea, question of vasovagal syncope.  Eating.  Discussed staying hydrated.  Will have cardiology evaluate.

## 2019-05-27 NOTE — Assessment & Plan Note (Signed)
Follow liver panel.  

## 2019-05-27 NOTE — Assessment & Plan Note (Signed)
Previous trial of crestor.  Desires not to restart.  Follow lipid panel.

## 2019-05-27 NOTE — Assessment & Plan Note (Signed)
Blood pressure elevated as outlined.  Increase losartan to 100mg  q day.  Follow pressures.  Follow metabolic panel.

## 2019-05-27 NOTE — Assessment & Plan Note (Signed)
On amiodarone.  Has metoprolol to take prn.  Blood pressure elevated.  Treat as outlined.  Recent syncopal episode.  Unclear etiology.  Given associated with abdominal pain, nausea and diarrhea, question if vasovagal episode.  No pain now.  No syncope or near syncope since.  Discussed f/u with cardiology for further evaluation.

## 2019-05-29 ENCOUNTER — Other Ambulatory Visit: Payer: Self-pay | Admitting: Internal Medicine

## 2019-05-29 DIAGNOSIS — I10 Essential (primary) hypertension: Secondary | ICD-10-CM

## 2019-05-29 NOTE — Progress Notes (Signed)
Order placed for f/u met b

## 2019-05-30 ENCOUNTER — Other Ambulatory Visit (INDEPENDENT_AMBULATORY_CARE_PROVIDER_SITE_OTHER): Payer: Medicare Other

## 2019-05-30 ENCOUNTER — Other Ambulatory Visit: Payer: Self-pay | Admitting: Internal Medicine

## 2019-05-30 ENCOUNTER — Other Ambulatory Visit: Payer: Self-pay

## 2019-05-30 DIAGNOSIS — R1084 Generalized abdominal pain: Secondary | ICD-10-CM

## 2019-05-30 DIAGNOSIS — I10 Essential (primary) hypertension: Secondary | ICD-10-CM

## 2019-05-30 DIAGNOSIS — R7989 Other specified abnormal findings of blood chemistry: Secondary | ICD-10-CM | POA: Diagnosis not present

## 2019-05-30 LAB — BASIC METABOLIC PANEL
BUN: 17 mg/dL (ref 6–23)
CO2: 29 mEq/L (ref 19–32)
Calcium: 9.5 mg/dL (ref 8.4–10.5)
Chloride: 104 mEq/L (ref 96–112)
Creatinine, Ser: 1.06 mg/dL (ref 0.40–1.20)
GFR: 49.48 mL/min — ABNORMAL LOW (ref >60.00)
Glucose, Bld: 84 mg/dL (ref 70–99)
Potassium: 4.4 mEq/L (ref 3.5–5.1)
Sodium: 141 mEq/L (ref 135–145)

## 2019-05-30 LAB — TSH: TSH: 3.89 u[IU]/mL (ref 0.35–4.50)

## 2019-05-30 NOTE — Progress Notes (Signed)
Order placed for CT abdomen and pelvis.  

## 2019-05-31 ENCOUNTER — Telehealth: Payer: Self-pay | Admitting: Internal Medicine

## 2019-05-31 NOTE — Telephone Encounter (Signed)
Patient stated that she spoke with you on Tuesday night, I don't see a new prescription that was sent in. I see where her Losartan dose was increased on 8/19. Is she supposed to be picking up a new rx?

## 2019-05-31 NOTE — Telephone Encounter (Signed)
Medication:  BP Med  Pt states that Dr Nicki Reaper called her yesterday evening and informed her that her BP meds needed to be changed and that she would be calling in something new for pt.  When pt called pharmacy this morning, they do not have rx for her.  Please advise.   Preferred Pharmacy: [phone] (Phone) [phone] (Fax)

## 2019-06-01 MED ORDER — AMLODIPINE BESYLATE 2.5 MG PO TABS: 2.5000 mg | ORAL_TABLET | Freq: Every day | ORAL | 2 refills | Status: DC

## 2019-06-01 NOTE — Telephone Encounter (Signed)
rx sent in for amlodipine to [facility].  Please notify pt and notify sorry for inconvenience.

## 2019-06-01 NOTE — Telephone Encounter (Signed)
Pt aware.

## 2019-06-11 NOTE — Progress Notes (Signed)
Cardiology Office Note    Date:  06/15/2019   ID:  Anita Benjamin, DOB [birth year], MRN [id number]  PCP:  Einar Pheasant, MD  Cardiologist:  Ida Rogue, MD  Electrophysiologist:  None   Chief Complaint: Follow up  History of Present Illness:   Anita Benjamin is a 85 y.o. female with history of typical atrial flutter on Eliquis, SVT, recent syncopal episode as detailed below, HTN, HLD, hypotension, fatigue, and GERD who presents for evaluation of recent syncopal episode.  Notes indicate the patient has a greater than 10-year history of tachyarrhythmia/palpitations.  Prior event monitor from 07/2014 for palpitations showed normal sinus rhythm with sinus tachycardia and an episode of SVT.  Patient was seen in the office in 11/2014 for tachypalpitations and noted to be an atrial flutter with RVR at that time.  Echo in 12/2014 showed an EF of 60 to 65%, normal wall motion, normal LV diastolic function, mild aortic valve insufficiency, normal size left atrium, RVSF normal, PASP normal.  No prior ischemic evaluations.  Over the years, she has been managed with metoprolol and amiodarone with good control of her arrhythmia with subsequent discontinuation of metoprolol secondary to bradycardia with recommendation to take metoprolol as needed for breakthrough tachypalpitations.  Patient was seen by PCP in 08/2018 for routine follow-up of URI with EKG demonstrating atrial flutter with RVR, 120 bpm, incomplete RBBB.  Given she was adequately anticoagulated she was advised to reload with amiodarone.  Labs at that time were unrevealing including normal TSH and potassium of 4.6.  She was seen in follow-up by cardiology in 08/2018 and had a converted to sinus rhythm with a mildly bradycardic heart rate of 59 bpm.  She was most recently seen in the office in 12/2018 and was maintaining sinus rhythm on amiodarone.  Notes indicate she typically has elevated blood pressure readings in the office with well-controlled numbers  at home.  She denied any symptoms concerning for ischemia.  Patient has recently been seen by PCPs office for diarrhea with subsequent CT abdomen pelvis on 06/14/2019 demonstrated radiodense liver with no acute intra-abdominal or intrapelvic abnormality.  She was evaluated virtually by PCP on 05/23/2019 reporting an episode of syncope occurring after eating a spicy sandwich leading to abdominal pain and diarrhea.  Patient reported her head started spinning and upon standing up after using the bathroom she suffered a syncopal episode.  Patient reported BP of 166/83 at that visit.  Concern was for potential vasovagal etiology of syncope.  Patient comes in today doing well from a cardiac perspective.  She has not had any further syncopal episodes.  She tells me she had severe abdominal pain leading up to her syncopal episode with associated diarrhea.  Upon standing up to get off the toilet she felt dizzy and fell to the ground with possible brief loss of consciousness for 1 to 2 seconds.  There was no associated chest pain, shortness of breath, palpitations, diaphoresis, nausea, or vomiting.  No seizure-like activity.  She did not hit her head.  She has continued to have loose stool that appears lighter in color and smaller in caliber.  She underwent CT abdomen pelvis on 06/14/2019 that showed no acute findings with a radiodense liver.  Blood pressure at home has also been elevated lately and she was recently started on amlodipine 2.5 mg in the evening by PCP.  More recently, she has noted some fluctuations and soft blood pressures into the low [id number] and 0000000 systolic rarely.   Labs: 05/2019 -  TSH normal, potassium 4.4, serum creatinine 1.06 04/2019 - Hgb 13.7, PLT 267, total cholesterol 204, triglyceride 94, HDL 78, LDL 107, AST/ALT normal, albumin 4.3    Past Medical History:  Diagnosis Date   Anemia    Atrial flutter ([place])    Fibrocystic breast disease    History of chicken pox     Hypercholesterolemia    Hypertension     Past Surgical History:  Procedure Laterality Date   BREAST BIOPSY Right 2014   NEG   Laser repair of torn retina      Current Medications: Current Meds  Medication Sig   Acetaminophen (TYLENOL ARTHRITIS PAIN PO) Take 1 tablet by mouth 2 (two) times daily.   amiodarone (PACERONE) 200 MG tablet TAKE 1 TABLET BY MOUTH ONCE DAILY   amLODipine (NORVASC) 2.5 MG tablet Take 1 tablet (2.5 mg total) by mouth daily.   Ascorbic Acid (VITAMIN C) 250 MG CHEW Chew by mouth daily.   calcium citrate-vitamin D 500-500 MG-UNIT chewable tablet Chew 1 tablet by mouth daily.   ELIQUIS 2.5 MG TABS tablet TAKE 1 TABLET BY MOUTH TWICE DAILY   famotidine (PEPCID) 20 MG tablet Take 20 mg by mouth daily.   loperamide (IMODIUM A-D) 2 MG tablet Take 1 tablet (2 mg total) by mouth 4 (four) times daily as needed for diarrhea or loose stools.   losartan (COZAAR) 100 MG tablet Take 1 tablet (100 mg total) by mouth daily.   metoprolol succinate (TOPROL-XL) 25 MG 24 hr tablet Take 1 tablet (25 mg total) by mouth daily as needed.   Multiple Vitamin (MULTIVITAMIN) tablet Take 1 tablet by mouth daily.   NON FORMULARY ViaActiv Takes 1 tablet by mouth daily.   sertraline (ZOLOFT) 50 MG tablet TAKE 1/2 TABLET BY MOUTH AT BEDTIME    Allergies:   Oruvail [ketoprofen], Relafen [nabumetone], and Timentin [ticarcillin-pot clavulanate]   Social History   Socioeconomic History   Marital status: Widowed    Spouse name: Not on file   Number of children: 2   Years of education: Not on file   Highest education level: Not on file  Occupational History   Not on file  Social Needs   Financial resource strain: Not hard at all   Food insecurity    Worry: Never true    Inability: Never true   Transportation needs    Medical: No    Non-medical: No  Tobacco Use   Smoking status: Never Smoker   Smokeless tobacco: Never Used  Substance and Sexual Activity    Alcohol use: No    Alcohol/week: 0.0 standard drinks   Drug use: No   Sexual activity: Not on file  Lifestyle   Physical activity    Days per week: 0 days    Minutes per session: Not on file   Stress: Not at all  Relationships   Social connections    Talks on phone: Not on file    Gets together: Not on file    Attends religious service: Not on file    Active member of club or organization: Not on file    Attends meetings of clubs or organizations: Not on file    Relationship status: Not on file  Other Topics Concern   Not on file  Social History Narrative   Not on file     Family History:  The patient's family history includes Breast cancer in her paternal aunt; Cancer (age of onset: 67) in her father; Heart disease (age of  onset: 37) in her mother. There is no history of Colon cancer.  ROS:   Review of Systems  Constitutional: Positive for malaise/fatigue. Negative for chills, diaphoresis, fever and weight loss.  HENT: Negative for congestion.   Eyes: Negative for discharge and redness.  Respiratory: Negative for cough, hemoptysis, sputum production, shortness of breath and wheezing.   Cardiovascular: Negative for chest pain, palpitations, orthopnea, claudication, leg swelling and PND.  Gastrointestinal: Positive for abdominal pain and diarrhea. Negative for blood in stool, constipation, heartburn, melena, nausea and vomiting.  Genitourinary: Negative for hematuria.  Musculoskeletal: Negative for falls and myalgias.  Skin: Negative for rash.  Neurological: Positive for loss of consciousness and weakness. Negative for dizziness, tingling, tremors, sensory change, speech change and focal weakness.  Endo/Heme/Allergies: Does not bruise/bleed easily.  Psychiatric/Behavioral: Negative for substance abuse. The patient is not nervous/anxious.   All other systems reviewed and are negative.    EKGs/Labs/Other Studies Reviewed:    Studies reviewed were summarized above.  The additional studies were reviewed today:  2D Echo 12/2014: - Left ventricle: The cavity size was normal. Systolic function was  normal. The estimated ejection fraction was in the range of 60%  to 65%. Wall motion was normal; there were no regional wall  motion abnormalities. Left ventricular diastolic function  parameters were normal.  - Aortic valve: There was mild regurgitation.  - Left atrium: The atrium was normal in size.  - Right ventricle: Systolic function was normal.  - Pulmonary arteries: Systolic pressure was within the normal  range.    EKG:  EKG is ordered today.  The EKG ordered today demonstrates NSR, 61 bpm, low voltage QRS, baseline wandering, no acute ST-T changes (grossly unchanged from prior  Recent Labs: 04/12/2019: ALT 23; Hemoglobin 13.7; Platelets 267.0 05/30/2019: BUN 17; Creatinine, Ser 1.06; Potassium 4.4; Sodium 141; TSH 3.89  Recent Lipid Panel    Component Value Date/Time   CHOL 204 (H) 04/12/2019 0946   TRIG 94.0 04/12/2019 0946   HDL 78.20 04/12/2019 0946   CHOLHDL 3 04/12/2019 0946   VLDL 18.8 04/12/2019 0946   LDLCALC 107 (H) 04/12/2019 0946   LDLDIRECT 117.7 11/06/2013 0952    PHYSICAL EXAM:    VS:  BP (!) 189/82 (BP Location: Right Arm, Patient Position: Sitting, Cuff Size: Normal)    Pulse 61    Ht 5\' 7"  (1.702 m)    Wt 130 lb 8 oz (59.2 kg)    LMP 10/27/1989    SpO2 98%    BMI 20.44 kg/m   BMI: Body mass index is 20.44 kg/m.  Physical Exam  Constitutional: She is oriented to person, place, and time. She appears well-developed and well-nourished.  HENT:  Head: Normocephalic and atraumatic.  Eyes: Right eye exhibits no discharge. Left eye exhibits no discharge.  Neck: Normal range of motion. No JVD present.  Cardiovascular: Normal rate, regular rhythm, S1 normal, S2 normal and normal heart sounds. Exam reveals no distant heart sounds, no friction rub, no midsystolic click and no opening snap.  No murmur heard. Pulses:       Posterior tibial pulses are 2+ on the right side and 2+ on the left side.  Pulmonary/Chest: Effort normal and breath sounds normal. No respiratory distress. She has no decreased breath sounds. She has no wheezes. She has no rales. She exhibits no tenderness.  Abdominal: Soft. She exhibits no distension. There is no abdominal tenderness.  Musculoskeletal:        General: No edema.  Neurological: She  is alert and oriented to person, place, and time.  Skin: Skin is warm and dry. No cyanosis. Nails show no clubbing.  Psychiatric: She has a normal mood and affect. Her speech is normal and behavior is normal. Judgment and thought content normal.    Wt Readings from Last 3 Encounters:  06/15/19 130 lb 8 oz (59.2 kg)  12/04/18 132 lb 8 oz (60.1 kg)  10/20/18 129 lb 12.8 oz (58.9 kg)     Orthostatic vital signs: Lying: 171/81, 59 bpm Sitting: 187/80, 61 bpm Standing: 184/82, 63 bpm Standing x3 minutes: 181/82, 61 bpm   ASSESSMENT & PLAN:   1. Syncope: History is consistent with vasovagal etiology in the setting of significant abdominal pain and loose stool.  She did not hit her head.  Orthostatic vital signs normal in the office today.  No further episodes.  Obtain a transthoracic echo and carotid artery ultrasound.  If these are unrevealing we could consider outpatient cardiac monitoring to evaluate for potential posttermination pause given her history of atrial flutter.  2. Typical atrial flutter: Maintaining sinus rhythm with a heart rate of 61 bpm.  Given abnormal liver finding noted on CT, possibly in the setting of amiodarone usage, we have agreed to discontinue amiodarone at this time.  Historically, she has not been managed with standing beta-blockade secondary to bradycardia.  As amiodarone continues to wash out of her system over the next several months hopefully we can introduce low-dose beta-blocker at that time.  Remains on a low-dose Eliquis given age and weight without any symptoms  concerning for bleeding.  Recent CBC demonstrated stable hemoglobin as above.  Has not needed any as needed metoprolol.  Recent liver function and TSH normal.  3. Abnormal liver finding on CT: Incidentally noted and likely in the setting of amiodarone usage.  Liver function has been normal dating back to 12/04/2014, and was most recently checked in 04/2019.  Consider iron studies per PCP.  Check liver function today.  She remains on amiodarone 200 mg daily.  In light of the findings noted on CT we have agreed to discontinue amiodarone at this time with plan to follow-up liver function testing in 1 month.  4. Hypertension: Blood pressure is poorly controlled in the office today.  She is also noted over the past month or so blood pressure being elevated at home with a reading this morning of 170/83.  She was recently started on amlodipine 2.5 mg in the evening by PCP.  With this, she has noted some significant fluctuations in blood pressure with readings down into the 0000000 systolic rarely.  Continue losartan 100 mg daily.  I have adjusted her amlodipine to take 2.5 mg as needed for systolic blood pressure greater than 150.  5. Changes in stool: She continues to note lighter than usual stool that appears to be smaller in caliber.  She denies any BRBPR or melena.  Recent CBC demonstrated stable hemoglobin as outlined above.  CT imaging as outlined above.  Recommend she follow-up with PCP.  Disposition: F/u with Dr. Rockey Situ or an APP in 1 month.   Medication Adjustments/Labs and Tests Ordered: Current medicines are reviewed at length with the patient today.  Concerns regarding medicines are outlined above. Medication changes, Labs and Tests ordered today are summarized above and listed in the Patient Instructions accessible in Encounters.   Signed, Christell Faith, PA-C 06/15/2019 9:23 AM     [address] [place], [place] [address] [phone]  [phone] °

## 2019-06-14 ENCOUNTER — Other Ambulatory Visit: Payer: Self-pay

## 2019-06-14 ENCOUNTER — Ambulatory Visit
Admission: RE | Admit: 2019-06-14 | Discharge: 2019-06-14 | Disposition: A | Discharge status: Home / Self Care | Payer: Medicare Other | Source: Ambulatory Visit | Attending: Internal Medicine | Admitting: Internal Medicine

## 2019-06-14 DIAGNOSIS — R1084 Generalized abdominal pain: Secondary | ICD-10-CM | POA: Diagnosis present

## 2019-06-15 ENCOUNTER — Encounter: Payer: Self-pay | Admitting: Physician Assistant

## 2019-06-15 ENCOUNTER — Other Ambulatory Visit: Payer: Self-pay | Admitting: Cardiovascular Disease

## 2019-06-15 ENCOUNTER — Ambulatory Visit (INDEPENDENT_AMBULATORY_CARE_PROVIDER_SITE_OTHER): Payer: Medicare Other | Admitting: Physician Assistant

## 2019-06-15 VITALS — BP 189/82 | HR 61 | Ht 67.0 in | Wt 130.5 lb

## 2019-06-15 DIAGNOSIS — Z79899 Other long term (current) drug therapy: Secondary | ICD-10-CM | POA: Diagnosis not present

## 2019-06-15 DIAGNOSIS — I483 Typical atrial flutter: Secondary | ICD-10-CM | POA: Diagnosis not present

## 2019-06-15 DIAGNOSIS — R932 Abnormal findings on diagnostic imaging of liver and biliary tract: Secondary | ICD-10-CM

## 2019-06-15 DIAGNOSIS — R55 Syncope and collapse: Secondary | ICD-10-CM

## 2019-06-15 DIAGNOSIS — I10 Essential (primary) hypertension: Secondary | ICD-10-CM

## 2019-06-15 MED ORDER — AMLODIPINE BESYLATE 2.5 MG PO TABS: 2.5000 mg | ORAL_TABLET | ORAL | 2 refills | Status: DC | PRN

## 2019-06-15 NOTE — Telephone Encounter (Signed)
Last office visit 06/15/2019 59kg Scr 63.23 85 years old

## 2019-06-15 NOTE — Telephone Encounter (Signed)
Refill Request.  

## 2019-06-15 NOTE — Patient Instructions (Signed)
Medication Instructions:  1- STOP Amiodarone 2- CHANGE Amlodipine to 1 tablet (2.5 mg total) as needed for systolic blood pressure greater than 150. If you need a refill on your cardiac medications before your next appointment, please call your pharmacy.   Lab work: None ordered  If you have labs (blood work) drawn today and your tests are completely normal, you will receive your results only by: Marland Kitchen MyChart Message (if you have MyChart) OR . A paper copy in the mail If you have any lab test that is abnormal or we need to change your treatment, we will call you to review the results.  Testing/Procedures: 1- Echo Echo  Please return to [facility] on ______________ at _______________ AM/PM for an Echocardiogram. Your physician has requested that you have an echocardiogram. Echocardiography is a painless test that uses sound waves to create images of your heart. It provides your doctor with information about the size and shape of your heart and how well your heart's chambers and valves are working. This procedure takes approximately one hour. There are no restrictions for this procedure. Please note; depending on visual quality an IV may need to be placed.   2- Carotid u/s  Your physician has requested that you have a carotid duplex. This test is an ultrasound of the carotid arteries in your neck. It looks at blood flow through these arteries that supply the brain with blood. Allow one hour for this exam. There are no restrictions or special instructions.    Follow-Up: At [facility], you and your health needs are our priority.  As part of our continuing mission to provide you with exceptional heart care, we have created designated Provider Care Teams.  These Care Teams include your primary Cardiologist (physician) and Advanced Practice Providers (APPs -  Physician Assistants and Nurse Practitioners) who all work together to provide you with the care you need, when you need  it. You will need a follow up appointment in 1 months.  You may see Ida Rogue, MD or Christell Faith, PA-C.

## 2019-06-17 ENCOUNTER — Other Ambulatory Visit: Payer: Self-pay | Admitting: Internal Medicine

## 2019-06-17 DIAGNOSIS — R932 Abnormal findings on diagnostic imaging of liver and biliary tract: Secondary | ICD-10-CM

## 2019-06-17 NOTE — Progress Notes (Signed)
Orders placed for f/u labs.  

## 2019-06-22 ENCOUNTER — Other Ambulatory Visit (INDEPENDENT_AMBULATORY_CARE_PROVIDER_SITE_OTHER): Payer: Medicare Other

## 2019-06-22 ENCOUNTER — Other Ambulatory Visit: Payer: Self-pay

## 2019-06-22 DIAGNOSIS — Z23 Encounter for immunization: Secondary | ICD-10-CM | POA: Diagnosis not present

## 2019-06-22 DIAGNOSIS — R932 Abnormal findings on diagnostic imaging of liver and biliary tract: Secondary | ICD-10-CM | POA: Diagnosis not present

## 2019-06-22 LAB — CBC WITH DIFFERENTIAL/PLATELET
Basophils Absolute: 0.1 10*3/uL (ref 0.0–0.1)
Basophils Relative: 1.3 % (ref 0.0–3.0)
Eosinophils Absolute: 0.1 10*3/uL (ref 0.0–0.7)
Eosinophils Relative: 2 % (ref 0.0–5.0)
HCT: 38.4 % (ref 36.0–46.0)
Hemoglobin: 12.9 g/dL (ref 12.0–15.0)
Lymphocytes Relative: 17.9 % (ref 12.0–46.0)
Lymphs Abs: 1 10*3/uL (ref 0.7–4.0)
MCHC: 33.6 g/dL (ref 30.0–36.0)
MCV: 95.3 fl (ref 78.0–100.0)
Monocytes Absolute: 0.5 10*3/uL (ref 0.1–1.0)
Monocytes Relative: 9.3 % (ref 3.0–12.0)
Neutro Abs: 3.8 10*3/uL (ref 1.4–7.7)
Neutrophils Relative %: 69.5 % (ref 43.0–77.0)
Platelets: 265 10*3/uL (ref 150.0–400.0)
RBC: 4.03 Mil/uL (ref 3.87–5.11)
RDW: 14.1 % (ref 11.5–15.5)
WBC: 5.5 10*3/uL (ref 4.0–10.5)

## 2019-06-22 LAB — HEPATIC FUNCTION PANEL
ALT: 21 U/L (ref 0–35)
AST: 21 U/L (ref 0–37)
Albumin: 4.2 g/dL (ref 3.5–5.2)
Alkaline Phosphatase: 71 U/L (ref 39–117)
Bilirubin, Direct: 0.1 mg/dL (ref 0.0–0.3)
Total Bilirubin: 0.5 mg/dL (ref 0.2–1.2)
Total Protein: 6.8 g/dL (ref 6.0–8.3)

## 2019-06-22 LAB — IBC + FERRITIN
Ferritin: 102.1 ng/mL (ref 10.0–291.0)
Iron: 111 ug/dL (ref 42–145)
Saturation Ratios: 32.2 % (ref 20.0–50.0)
Transferrin: 246 mg/dL (ref 212.0–360.0)

## 2019-06-29 ENCOUNTER — Other Ambulatory Visit: Payer: Self-pay

## 2019-06-29 ENCOUNTER — Ambulatory Visit (INDEPENDENT_AMBULATORY_CARE_PROVIDER_SITE_OTHER): Payer: Medicare Other | Admitting: Internal Medicine

## 2019-06-29 DIAGNOSIS — I483 Typical atrial flutter: Secondary | ICD-10-CM

## 2019-06-29 DIAGNOSIS — R945 Abnormal results of liver function studies: Secondary | ICD-10-CM

## 2019-06-29 DIAGNOSIS — R109 Unspecified abdominal pain: Secondary | ICD-10-CM

## 2019-06-29 DIAGNOSIS — I10 Essential (primary) hypertension: Secondary | ICD-10-CM

## 2019-06-29 DIAGNOSIS — R7989 Other specified abnormal findings of blood chemistry: Secondary | ICD-10-CM

## 2019-06-29 DIAGNOSIS — E7800 Pure hypercholesterolemia, unspecified: Secondary | ICD-10-CM

## 2019-06-29 DIAGNOSIS — R932 Abnormal findings on diagnostic imaging of liver and biliary tract: Secondary | ICD-10-CM

## 2019-06-29 DIAGNOSIS — R55 Syncope and collapse: Secondary | ICD-10-CM

## 2019-06-29 NOTE — Progress Notes (Signed)
Patient ID: Anita Benjamin, female   DOB: [birth year], 85 y.o.   MRN: [id number]   Virtual Visit via telephone Note  This visit type was conducted due to national recommendations for restrictions regarding the COVID-19 pandemic (e.g. social distancing).  This format is felt to be most appropriate for this patient at this time.  All issues noted in this document were discussed and addressed.  No physical exam was performed (except for noted visual exam findings with Video Visits).   I connected with [name] by telephone and verified that I am speaking with the correct person using two identifiers. Location patient: home Location provider: work Persons participating in the virtual visit: patient, provider  I discussed the limitations, risks, security and privacy concerns of performing an evaluation and management service by telephone and the availability of in person appointments. The patient expressed understanding and agreed to proceed.   Reason for visit: scheduled follow up.   HPI: Last visit, had a syncopal episode. This occurred after eating a spicy sandwich and experiencing abdominal pain.  She subsequently developed diarrhea and nausea.  Head started spinning and she passed out.  Felt to be a vasovagal episode.  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  Her amiodarone was stopped.  She feels better.  Blood pressure has been better.  States she was instructed by cardiology to take the amlodipine prn.  No nausea or vomiting.  Eating.  She did have an episode approximately one week ago, where she was leaning over to pick something up off the ground.  She does not remember going to the ground, but came to on the ground.  Was not out long.  Has a black eye.  No headache.  No dizziness.  No vision change.  No chest pain.  Able to walk without pain.  Wrist was sore, but is better now  after wearing a brace. Has had no further episodes this week.  Planning for ECHO and carotid ultrasound.     ROS: See pertinent positives and negatives per HPI.  Past Medical History:  Diagnosis Date  . Anemia   . Atrial flutter ([place])   . Fibrocystic breast disease   . History of chicken pox   . Hypercholesterolemia   . Hypertension     Past Surgical History:  Procedure Laterality Date  . BREAST BIOPSY Right 2014   NEG  . Laser repair of torn retina      Family History  Problem Relation Age of Onset  . Heart disease Mother 24       Heart attack  . Cancer Father 107       lung cancer  . Breast cancer Paternal Aunt        x3  . Colon cancer Neg Hx     SOCIAL HX: reviewed.    Current Outpatient Medications:  .  Acetaminophen (TYLENOL ARTHRITIS PAIN PO), Take 1 tablet by mouth 2 (two) times daily., Disp: , Rfl:  .  amLODipine (NORVASC) 2.5 MG tablet, Take 1 tablet (2.5 mg total) by mouth as needed (for SBP > 150)., Disp: 30 tablet, Rfl: 2 .  Ascorbic Acid (VITAMIN C) 250 MG CHEW, Chew by mouth daily., Disp: , Rfl:  .  calcium citrate-vitamin D 500-500 MG-UNIT chewable tablet, Chew 1 tablet by mouth daily., Disp: , Rfl:  .  ELIQUIS 2.5 MG TABS tablet, TAKE 1 TABLET BY  MOUTH TWICE DAILY, Disp: 180 tablet, Rfl: 1 .  famotidine (PEPCID) 20 MG tablet, Take 20 mg by mouth daily., Disp: , Rfl:  .  loperamide (IMODIUM A-D) 2 MG tablet, Take 1 tablet (2 mg total) by mouth 4 (four) times daily as needed for diarrhea or loose stools., Disp: 30 tablet, Rfl: 0 .  losartan (COZAAR) 100 MG tablet, Take 1 tablet (100 mg total) by mouth daily., Disp: 30 tablet, Rfl: 2 .  metoprolol succinate (TOPROL-XL) 25 MG 24 hr tablet, Take 1 tablet (25 mg total) by mouth daily as needed., Disp: 30 tablet, Rfl: 1 .  Multiple Vitamin (MULTIVITAMIN) tablet, Take 1 tablet by mouth daily., Disp: , Rfl:  .  NON FORMULARY, ViaActiv Takes 1 tablet by mouth daily., Disp: , Rfl:  .  sertraline (ZOLOFT) 50 MG  tablet, TAKE 1/2 TABLET BY MOUTH AT BEDTIME, Disp: 30 tablet, Rfl: 2  EXAM:  VITALS per patient if applicable:130/74  GENERAL: alert. Sounds to be in no acute distress.  Answering questions appropriately.    PSYCH/NEURO: pleasant and cooperative, no obvious depression or anxiety, speech and thought processing grossly intact  ASSESSMENT AND PLAN:  Discussed the following assessment and plan:  Abdominal discomfort Abdominal discomfort better.  CT reviewed.  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  One of which is amiodarone therapy.  Amiodarone stopped.  Iron studies wnl.  She is feeling some better.  Discussed GI referral for further evaluation to confirm if any further w/up warranted.    Abnormal liver function test Last liver panel wnl.    Atrial flutter Off amiodarone now. Denies any increased heart rate or palpitations.  Has metoprolol to take prn.  Continue f/u with cardiology.  Has echo scheduled.    Essential hypertension Blood pressure better.  Mostly averaging 120-140/70s.  Follow pressures.  Follow metabolic panel.    Hypercholesterolemia Previous trial of crestor.  Desires not to restart. Follow lipid panel.   Syncope Had the previous episode of syncope which was felt to be vasovagal episode.  Unclear etiology of this recent episode.  No pain.  No nausea or vomiting. No dizziness or light headedness.  Bent over - and continued to fall to ground.  Does not remember hitting the ground.  Planning for echo and carotid ultrasound.  Will notify cardiology with questions of need for further w/up or evaluation.  Question of need for monitor, etc.      I discussed the assessment and treatment plan with the patient. The patient was provided an opportunity to ask questions and all were answered. The patient agreed with the plan and demonstrated an understanding of the instructions.   The patient was advised to call back or seek an in-person evaluation if the  symptoms worsen or if the condition fails to improve as anticipated.  I provided 25 minutes of non-face-to-face time during this encounter.   Einar Pheasant, MD

## 2019-07-01 ENCOUNTER — Encounter: Payer: Self-pay | Admitting: Internal Medicine

## 2019-07-01 NOTE — Assessment & Plan Note (Signed)
Abdominal discomfort better.  CT reviewed.  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  One of which is amiodarone therapy.  Amiodarone stopped.  Iron studies wnl.  She is feeling some better.  Discussed GI referral for further evaluation to confirm if any further w/up warranted.

## 2019-07-01 NOTE — Assessment & Plan Note (Signed)
Off amiodarone now. Denies any increased heart rate or palpitations.  Has metoprolol to take prn.  Continue f/u with cardiology.  Has echo scheduled.

## 2019-07-01 NOTE — Assessment & Plan Note (Signed)
Previous trial of crestor.  Desires not to restart. Follow lipid panel.

## 2019-07-01 NOTE — Assessment & Plan Note (Signed)
Had the previous episode of syncope which was felt to be vasovagal episode.  Unclear etiology of this recent episode.  No pain.  No nausea or vomiting. No dizziness or light headedness.  Bent over - and continued to fall to ground.  Does not remember hitting the ground.  Planning for echo and carotid ultrasound.  Will notify cardiology with questions of need for further w/up or evaluation.  Question of need for monitor, etc.

## 2019-07-01 NOTE — Assessment & Plan Note (Signed)
Blood pressure better.  Mostly averaging 120-140/70s.  Follow pressures.  Follow metabolic panel.

## 2019-07-01 NOTE — Assessment & Plan Note (Signed)
Last liver panel wnl.  

## 2019-07-08 ENCOUNTER — Telehealth: Payer: Self-pay | Admitting: Internal Medicine

## 2019-07-08 NOTE — Telephone Encounter (Signed)
-----   Message from Minna Merritts, MD sent at 07/08/2019  6:04 PM EDT ----- Regarding: RE: update Triage, can we order a 2 week zio monitor We will need to time with around her echo Thx TG ----- Message ----- From: Einar Pheasant, MD Sent: 07/01/2019   9:09 PM EDT To: Minna Merritts, MD Subject: update                                         Ms Parnes had a regular scheduled f/u with me 06/29/19.  The last time I saw her she had what I felt was probably a vasovagal episode.  Had syncopal episode after feeling sick, etc.  She saw Thurmond Butts and is planning to have an echo and carotid ultrasound in 10.2020.  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  (possibly amiodarone).  Off amiodarone now.  Feeling better.  Had an episode one week ago, where she was bending over and does not remember falling.  Came to -  on the ground.  Did not feel bad prior to this last episode.  She has done well since this last episode.  Blood pressures averaging 130-140/70s.  She is scheduled for the echo and carotid ultrasound, but I was not sure if any further cardiac w/up warranted.  Not sure of etiology.  Just wanted to make you aware of this last episode and wanted to get your thoughts about further w/up and evaluation.    Thank you for your help.   Einar Pheasant

## 2019-07-09 ENCOUNTER — Telehealth: Payer: Self-pay | Admitting: *Deleted

## 2019-07-09 DIAGNOSIS — R55 Syncope and collapse: Secondary | ICD-10-CM

## 2019-07-09 NOTE — Telephone Encounter (Signed)
-----   Message from Minna Merritts, MD sent at 07/08/2019  6:04 PM EDT ----- Regarding: RE: update Triage, can we order a 2 week zio monitor We will need to time with around her echo Thx TG ----- Message ----- From: Einar Pheasant, MD Sent: 07/01/2019   9:09 PM EDT To: Minna Merritts, MD Subject: update                                         Ms Aherne had a regular scheduled f/u with me 06/29/19.  The last time I saw her she had what I felt was probably a vasovagal episode.  Had syncopal episode after feeling sick, etc.  She saw Thurmond Butts and is planning to have an echo and carotid ultrasound in 10.2020.  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  (possibly amiodarone).  Off amiodarone now.  Feeling better.  Had an episode one week ago, where she was bending over and does not remember falling.  Came to -  on the ground.  Did not feel bad prior to this last episode.  She has done well since this last episode.  Blood pressures averaging 130-140/70s.  She is scheduled for the echo and carotid ultrasound, but I was not sure if any further cardiac w/up warranted.  Not sure of etiology.  Just wanted to make you aware of this last episode and wanted to get your thoughts about further w/up and evaluation.    Thank you for your help.   Einar Pheasant

## 2019-07-09 NOTE — Telephone Encounter (Signed)
Order placed.  Scheduling, Please call patient to arrange to come in for ZIO placement in time frame appropriate regarding patient's echo.  Thanks!

## 2019-07-13 ENCOUNTER — Ambulatory Visit (INDEPENDENT_AMBULATORY_CARE_PROVIDER_SITE_OTHER): Payer: Medicare Other

## 2019-07-13 ENCOUNTER — Other Ambulatory Visit: Payer: Self-pay

## 2019-07-13 ENCOUNTER — Ambulatory Visit: Payer: Medicare Other

## 2019-07-13 DIAGNOSIS — R55 Syncope and collapse: Secondary | ICD-10-CM

## 2019-07-17 ENCOUNTER — Ambulatory Visit (INDEPENDENT_AMBULATORY_CARE_PROVIDER_SITE_OTHER): Payer: Medicare Other

## 2019-07-17 ENCOUNTER — Other Ambulatory Visit: Payer: Self-pay

## 2019-07-17 DIAGNOSIS — Z0000 Encounter for general adult medical examination without abnormal findings: Secondary | ICD-10-CM

## 2019-07-17 NOTE — Patient Instructions (Addendum)
  Anita Benjamin , Thank you for taking time to come for your Medicare Wellness Visit. I appreciate your ongoing commitment to your health goals. Please review the following plan we discussed and let me know if I can assist you in the future.   These are the goals we discussed: Goals    . Follow up with Primary Care Provider     As needed       This is a list of the screening recommended for you and due dates:  Health Maintenance  Topic Date Due  . Mammogram  04/08/2019  . Tetanus Vaccine  10/27/2022  . Flu Shot  Completed  . DEXA scan (bone density measurement)  Completed  . Pneumonia vaccines  Completed

## 2019-07-17 NOTE — Telephone Encounter (Signed)
Dr. Rockey Situ,  Echo done 10/9. No monitor placed at that time (I'm not certain why). She is now wanting to know if you want her to have this placed and postpone her f/u with you on 10/19.  Is a regular ZIO ok if you want one placed now or would she need a real time monitor?   Please advise.

## 2019-07-17 NOTE — Telephone Encounter (Signed)
Patient hesitant to schedule monitor placement and wants to talk to dr. Rockey Situ.    Should she have zio placed and postpone fu ( 10/19 too soon for results to be in) or wait for placement at [facility].

## 2019-07-17 NOTE — Progress Notes (Addendum)
Subjective:   Anita Benjamin is a 85 y.o. female who presents for Medicare Annual (Subsequent) preventive examination.  Review of Systems:  No ROS.  Medicare Wellness Virtual Visit.  Visual/audio telehealth visit, UTA vital signs.   See social history for additional risk factors.   Cardiac Risk Factors include: advanced age (>31men, >55 women);hypertension     Objective:     Vitals: LMP 10/27/1989   There is no height or weight on file to calculate BMI.  Advanced Directives 07/17/2019 02/13/2018 02/10/2017  Does Patient Have a Medical Advance Directive? Yes Yes No  Type of Paramedic of [place];Living will [place];Living will -  Does patient want to make changes to medical advance directive? No - Patient declined No - Patient declined -  Copy of [place] in Chart? No - copy requested No - copy requested -  Would patient like information on creating a medical advance directive? - - No - Patient declined    Tobacco Social History   Tobacco Use  Smoking Status Never Smoker  Smokeless Tobacco Never Used     Counseling given: Not Answered   Clinical Intake:  Pre-visit preparation completed: Yes        Diabetes: No  How often do you need to have someone help you when you read instructions, pamphlets, or other written materials from your doctor or pharmacy?: 1 - Never  Interpreter Needed?: No     Past Medical History:  Diagnosis Date  . Anemia   . Atrial flutter ([place])   . Fibrocystic breast disease   . History of chicken pox   . Hypercholesterolemia   . Hypertension    Past Surgical History:  Procedure Laterality Date  . BREAST BIOPSY Right 2014   NEG  . Laser repair of torn retina     Family History  Problem Relation Age of Onset  . Heart disease Mother 57       Heart attack  . Cancer Father 53       lung cancer  . Breast cancer Paternal Aunt        x3  . Colon cancer Neg Hx     Social History   Socioeconomic History  . Marital status: Widowed    Spouse name: Not on file  . Number of children: 2  . Years of education: Not on file  . Highest education level: Not on file  Occupational History  . Not on file  Social Needs  . Financial resource strain: Not hard at all  . Food insecurity    Worry: Never true    Inability: Never true  . Transportation needs    Medical: No    Non-medical: No  Tobacco Use  . Smoking status: Never Smoker  . Smokeless tobacco: Never Used  Substance and Sexual Activity  . Alcohol use: No    Alcohol/week: 0.0 standard drinks  . Drug use: No  . Sexual activity: Not on file  Lifestyle  . Physical activity    Days per week: 0 days    Minutes per session: Not on file  . Stress: Not at all  Relationships  . Social Herbalist on phone: Not on file    Gets together: Not on file    Attends religious service: Not on file    Active member of club or organization: Not on file    Attends meetings of clubs or organizations: Not on file  Relationship status: Not on file  Other Topics Concern  . Not on file  Social History Narrative  . Not on file    Outpatient Encounter Medications as of 07/17/2019  Medication Sig  . Acetaminophen (TYLENOL ARTHRITIS PAIN PO) Take 1 tablet by mouth 2 (two) times daily.  Marland Kitchen amLODipine (NORVASC) 2.5 MG tablet Take 1 tablet (2.5 mg total) by mouth as needed (for SBP > 150).  . Ascorbic Acid (VITAMIN C) 250 MG CHEW Chew by mouth daily.  . calcium citrate-vitamin D 500-500 MG-UNIT chewable tablet Chew 1 tablet by mouth daily.  Marland Kitchen ELIQUIS 2.5 MG TABS tablet TAKE 1 TABLET BY MOUTH TWICE DAILY  . famotidine (PEPCID) 20 MG tablet Take 20 mg by mouth daily.  Marland Kitchen loperamide (IMODIUM A-D) 2 MG tablet Take 1 tablet (2 mg total) by mouth 4 (four) times daily as needed for diarrhea or loose stools.  Marland Kitchen losartan (COZAAR) 100 MG tablet Take 1 tablet (100 mg total) by mouth daily.  . metoprolol succinate  (TOPROL-XL) 25 MG 24 hr tablet Take 1 tablet (25 mg total) by mouth daily as needed.  . Multiple Vitamin (MULTIVITAMIN) tablet Take 1 tablet by mouth daily.  . NON FORMULARY ViaActiv Takes 1 tablet by mouth daily.  . sertraline (ZOLOFT) 50 MG tablet TAKE 1/2 TABLET BY MOUTH AT BEDTIME   No facility-administered encounter medications on file as of 07/17/2019.     Activities of Daily Living In your present state of health, do you have any difficulty performing the following activities: 07/17/2019  Hearing? N  Vision? N  Difficulty concentrating or making decisions? N  Walking or climbing stairs? N  Dressing or bathing? N  Doing errands, shopping? N  Preparing Food and eating ? N  Using the Toilet? N  In the past six months, have you accidently leaked urine? N  Do you have problems with loss of bowel control? N  Managing your Medications? N  Managing your Finances? N  Housekeeping or managing your Housekeeping? N  Some recent data might be hidden    Patient Care Team: Einar Pheasant, MD as PCP - General (Internal Medicine) Minna Merritts, MD as PCP - Cardiology (Cardiology) Bary Castilla, Forest Gleason, MD as Consulting Physician (General Surgery) Minna Merritts, MD as Consulting Physician (Cardiology)    Assessment:   This is a routine wellness examination for Anita Benjamin.  I connected with patient 07/17/19 at 11:00 AM EDT by a video/audio enabled telemedicine application and verified that I am speaking with the correct person using two identifiers. Patient stated full name and DOB. Patient gave permission to continue with virtual visit. Patient's location was at home and Nurse's location was at [place] office.   Health Maintenance Due: Mammogram- she plans to schedule later in the year   Update all pending maintenance due as appropriate.   See completed HM at the end of note.   Eye: Visual acuity not assessed. Virtual visit. Wears corrective lenses. Followed by their ophthalmologist  every 12 months.   Dental: UTD  Hearing: Demonstrates normal hearing during visit.  Safety:  Patient feels safe at home- yes Patient does have smoke detectors at home- yes Patient does wear sunscreen or protective clothing when in direct sunlight - yes Patient does wear seat belt when in a moving vehicle - yes Patient drives- yes Adequate lighting in walkways free from debris- yes Grab bars and handrails used as appropriate- yes Ambulates with no assistive device Cell phone on person when ambulating outside of  the home- yes  Social: Alcohol intake - no  Smoking history- never   Smokers in home? none Illicit drug use? none  Depression: PHQ 2 &9 complete. See screening below. Denies irritability, anhedonia, sadness/tearfullness.  Stable.   Falls: See screening below.  None since last reported to pcp in last 30 days.   Medication: Taking as directed and without issues.   Covid-19: Precautions and sickness symptoms discussed. Wears mask, social distancing, hand hygiene as appropriate.   Activities of Daily Living Patient denies needing assistance with: household chores, feeding themselves, getting from bed to chair, getting to the toilet, bathing/showering, dressing, managing money, or preparing meals.   Memory: Patient is alert. Patient denies difficulty focusing or concentrating. Correctly identified the president of the [place], season and recall 2/3. Patient likes to read for brain stimulation.  BMI- discussed the importance of a healthy diet, water intake and the benefits of aerobic exercise.  Educational material provided.  Physical activity- walking as tolerated, no routine.   Diet: low cholesterol Water: good intake  Other Providers Patient Care Team: Einar Pheasant, MD as PCP - General (Internal Medicine) Rockey Situ Kathlene November, MD as PCP - Cardiology (Cardiology) Bary Castilla, Forest Gleason, MD as Consulting Physician (General Surgery) Minna Merritts, MD as Consulting  Physician (Cardiology)  Exercise Activities and Dietary recommendations Current Exercise Habits: Home exercise routine, Type of exercise: walking, Intensity: Mild  Goals    . Follow up with Primary Care Provider     As needed       Fall Risk Fall Risk  06/29/2019 02/13/2018 10/06/2017 02/10/2017 01/20/2016  Falls in the past year? 1 No No No No  Number falls in past yr: 1 - - - -  Injury with Fall? 1 - - - -  Comment - - - - -  Risk for fall due to : History of fall(s);Impaired balance/gait;Impaired vision - - - -  Follow up Falls evaluation completed - - - -    Timed Get Up and Go performed: no, virtual visit  Depression Screen PHQ 2/9 Scores 07/17/2019 04/11/2019 02/13/2018 10/06/2017  PHQ - 2 Score 0 0 0 0  PHQ- 9 Score - - - -     Cognitive Function MMSE - Mini Mental State Exam 02/10/2017  Orientation to time 5  Orientation to Place 5  Registration 3  Attention/ Calculation 5  Recall 3  Language- name 2 objects 2  Language- repeat 1  Language- follow 3 step command 3  Language- read & follow direction 1  Write a sentence 1  Copy design 1  Total score 30     6CIT Screen 07/17/2019 02/13/2018  What Year? 0 points 0 points  What month? 0 points 0 points  What time? 0 points 0 points  Count back from 20 0 points 0 points  Months in reverse 0 points 0 points  Repeat phrase - 0 points  Total Score - 0    Immunization History  Administered Date(s) Administered  . Fluad Quad(high Dose 65+) 06/22/2019  . Influenza, High Dose Seasonal PF 06/10/2017, 09/14/2018  . Influenza,inj,Quad PF,6+ Mos 06/27/2013, 07/04/2014, 06/05/2015, 05/27/2016  . Pneumococcal Conjugate-13 10/21/2015  . Pneumococcal Polysaccharide-23 10/06/2017  . Tdap 10/27/2012   Screening Tests Health Maintenance  Topic Date Due  . MAMMOGRAM  04/08/2019  . TETANUS/TDAP  10/27/2022  . INFLUENZA VACCINE  Completed  . DEXA SCAN  Completed  . PNA vac Low Risk Adult  Completed      Plan:   Keep  all  routine maintenance appointments.   Follow up 08/13/19.  Medicare Attestation I have personally reviewed: The patient's medical and social history Their use of alcohol, tobacco or illicit drugs Their current medications and supplements The patient's functional ability including ADLs,fall risks, home safety risks, cognitive, and hearing and visual impairment Diet and physical activities Evidence for depression   In addition, I have reviewed and discussed with patient certain preventive protocols, quality metrics, and best practice recommendations. A written personalized care plan for preventive services as well as general preventive health recommendations were provided to patient via mail.     Varney Biles, LPN  [id number]   Reviewed above information.  Agree with assessment and plan.    Dr Nicki Reaper

## 2019-07-18 ENCOUNTER — Other Ambulatory Visit: Payer: Self-pay | Admitting: Internal Medicine

## 2019-07-18 NOTE — Telephone Encounter (Signed)
We can place the zio  when she comes in for her appointment on the 19th Probably best she comes in given syncope We can then call her with the results when they become available

## 2019-07-18 NOTE — Telephone Encounter (Signed)
I spoke with the patient. She is aware of Dr. Donivan Scull recommendations and is agreeable.

## 2019-07-20 NOTE — Progress Notes (Signed)
Cardiology Office Note  Date:  07/23/2019   ID:  Anita Benjamin, DOB 06-Oct-1933, MRN [id number]  PCP:  Einar Pheasant, MD   Chief Complaint  Patient presents with  . other    1 month follow up and discuss Echo & Carotid u/s. Meds reviewed by the pt. verbally. "doing well."     HPI:  Anita Benjamin is a very pleasant 85 y.o. woman, with a history of  tachycardia, SVT episodes,  atrial flutter, hypotension. several year history, possibly even up to 10 years of tachyarrhythmia. High BP in doctor offices She presents today for follow-up of her tachycardia and chest pain  Long discussion concerning recent events Episode of syncope 05/23/2019  occurring after eating a spicy sandwich leading to abdominal pain and diarrhea.  feeling sick Felt to be vasovagal syncope was bending over and does not remember falling.  Came to on the ground.  Did not feel bad prior to this last episode.   Has episodes of urgency and diarrhea every few weeks/once a month Bad morning today coming into the office, had spicy pizza last night, and apple, likely found the bathroom in time.  Had diarrhea Has appt with GI tomorrow  echo and carotid ultrasound in 10.2020.   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  (possibly amiodarone).  Off amiodarone now.   Echocardiogram, results reviewed with her in detail 1. Left ventricular ejection fraction, by visual estimation, is 60 to 65%. The left ventricle has normal function. Normal left ventricular size. There is no left ventricular hypertrophy.  2. Left ventricular diastolic Doppler parameters are consistent with impaired relaxation pattern of LV diastolic filling.  3. Global right ventricle has normal systolic function.The right ventricular size is normal. No increase in right ventricular wall thickness.  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  Normal carotid ultrasound.  Incidental findings of multiple thyroid nodules in the bilateral lobes of the thyroid. Two largest on the right measured 0.8x0.5cm and 1.1x1.0 cm. On the left they measured 1.0x0.5cm and 0.8x0.6cm  In follow-up she denies having any tachycardia or palpitations concerning for arrhythmia No leg edema   November 2019 for atrial flutter, started on amiodarone, early later converted to normal sinus rhythm Metoprolol held for bradycardia Remains on amiodarone 200 mg daily  EKG personally reviewed by myself on todays visit Shows sinus bradycardia rhythm. 60 bpm.  No significant ST or T wave changes noted   OTHER PAST MEDICAL HISTORY REVIEWED BY ME FOR TODAY'S VISIT: 10/01/2016 she had atrial flutter rate 120 bpm  started on amiodarone, metoprolol, eliquis.  She converted back to normal sinus rhythm  Mother died at age 49, found down, etiology unclear   family history, father had lung cancer, possible heart issues, died at age 48. Mother had a stroke, died at 55  In terms of her social history, she is not a smoker with no smoking history, lives at home with her husband. Is retired Total cholesterol 211  PMH:   has a past medical history of Anemia, Atrial flutter ([place]), Fibrocystic breast disease, History of chicken pox, Hypercholesterolemia, and Hypertension.  PSH:    Past Surgical History:  Procedure Laterality Date  . BREAST BIOPSY Right 2014   NEG  . Laser repair of torn retina      Current Outpatient Medications  Medication Sig Dispense Refill  . Acetaminophen (TYLENOL ARTHRITIS PAIN PO) Take 1 tablet by mouth 2 (two) times daily.    Marland Kitchen amLODipine (NORVASC) 2.5 MG tablet Take 1 tablet (2.5 mg total) by mouth as needed (for SBP > 150). 30 tablet 2  . Ascorbic Acid (VITAMIN C)  250 MG CHEW Chew by mouth daily.    . calcium citrate-vitamin D 500-500 MG-UNIT chewable tablet Chew 1 tablet by mouth daily.    Marland Kitchen ELIQUIS 2.5 MG TABS tablet TAKE 1 TABLET BY MOUTH TWICE DAILY 180 tablet 1  . famotidine (PEPCID) 20 MG tablet Take 20 mg by mouth daily.    Marland Kitchen loperamide (IMODIUM A-D) 2 MG tablet Take 1 tablet (2 mg total) by mouth 4 (four) times daily as needed for diarrhea or loose stools. 30 tablet 0  . losartan (COZAAR) 100 MG tablet Take 1 tablet (100 mg total) by mouth daily. 30 tablet 2  . metoprolol succinate (TOPROL-XL) 25 MG 24 hr tablet Take 1 tablet (25 mg total) by mouth daily as needed. 30 tablet 1  . Multiple Vitamin (MULTIVITAMIN) tablet Take 1 tablet by mouth daily.    . NON FORMULARY ViaActiv Takes 1 tablet by mouth daily.    . sertraline (ZOLOFT) 50 MG tablet TAKE 1/2 TABLET BY MOUTH AT BEDTIME 30 tablet 2   No current facility-administered medications for this visit.      Allergies:   Oruvail [ketoprofen], Relafen [nabumetone], and Timentin [ticarcillin-pot clavulanate]   Social History:  The patient  reports that she has never smoked. She has never used smokeless tobacco. She reports that she does not drink alcohol or use drugs.   Family History:   family history includes Breast cancer in her paternal aunt; Cancer (age of onset: 38) in her father; Heart disease (age of onset: 17) in her mother.    Review of Systems: Review of Systems  Constitutional: Negative.   Eyes: Negative.   Respiratory: Negative.   Cardiovascular: Negative.  Negative for palpitations.  Gastrointestinal: Negative.   Genitourinary: Negative.   Musculoskeletal: Negative.   Neurological: Positive for loss of consciousness.  Psychiatric/Behavioral: Negative.   All other systems reviewed and are negative.   PHYSICAL EXAM: VS:  BP (!) 190/88 (BP Location: Left Arm, Patient Position: Sitting, Cuff Size: Normal)   Pulse 60   Ht 5\' 6"  (1.676 m)   Wt 131 lb (59.4 kg)   LMP  10/27/1989   BMI 21.14 kg/m  , BMI Body mass index is 21.14 kg/m.  Constitutional:  oriented to person, place, and time. No distress.  HENT:  Head: Grossly normal Eyes:  no discharge. No scleral icterus.  Neck: No JVD, no carotid bruits  Cardiovascular: Regular rate and rhythm, no murmurs appreciated  Pulmonary/Chest: Clear to auscultation bilaterally, no wheezes or rales Abdominal: Soft.  no distension.  no tenderness.  Musculoskeletal: Normal range of motion Neurological:  normal muscle tone. Coordination normal. No atrophy Skin: Skin warm and dry Psychiatric: normal affect, pleasant  Recent Labs: 05/30/2019: BUN 17; Creatinine, Ser 1.06; Potassium 4.4; Sodium 141; TSH 3.89 06/22/2019: ALT 21; Hemoglobin 12.9; Platelets 265.0    Lipid Panel Lab Results  Component Value Date   CHOL 204 (H) 04/12/2019   HDL 78.20 04/12/2019   LDLCALC 107 (H) 04/12/2019   TRIG 94.0 04/12/2019      Wt Readings from Last 3 Encounters:  07/23/19 131 lb (59.4 kg)  06/15/19 130 lb 8 oz (59.2 kg)  12/04/18 132 lb 8 oz (60.1  kg)       ASSESSMENT AND PLAN:  Syncope Once a month or more has significant abdominal discomfort followed by diarrhea She feels this is triggered by spicy foods Preceding that she has several days of no bowel movement She is scheduled to see GI Suggested she talk with them about starting a regular fiber supplement Unable to exclude irritable bowel The GI symptoms likely leading to vasovagal syncope We will hold off on monitor at this time  Thyroid nodules Seen on carotid ultrasound, results discussed with her in detail We will order designated thyroid ultrasound  Essential hypertension Every visit to the office has elevated pressures 99991111 systolic or higher even on repeat today does not appear anxious She does report that she took her losartan this morning She reports that is markedly elevated typically every morning and then trends down through the course of  the day as losartan presumably kicks in -Recommend she add amlodipine 5 mg at dinnertime and call us with blood pressure measurements -We did give clonidine 0.2 mg x 1 in the office and pressure has started to trend down now in the 170 range  Typical atrial flutter ([place]) Continue anticoagulation Denies having any tachyarrhythmia concerning for flutter  Chronic fatigue Poor sleep hygiene Will defer to primary care  Chest pain Denies having any chest pain on exertion  Encounter for anticoagulation discussion and counseling Plan: Tolerating anticoagulation, Eliquis   Total encounter time more than 45 minutes  Greater than 50% was spent in counseling and coordination of care with the patient   Disposition:   F/U  12 months   Orders Placed This Encounter  Procedures  . EKG 12-Lead   I, Jesus Reyes am acting as a scribe for Ida Rogue, M.D., Ph.D.  I, Ida Rogue, M.D. Ph.D., have reviewed the above documentation for accuracy and completeness, and I agree with the above.    Signed, Esmond Plants, M.D., Ph.D. 07/23/2019  [place], [place]

## 2019-07-23 ENCOUNTER — Encounter: Payer: Self-pay | Admitting: Cardiovascular Disease

## 2019-07-23 ENCOUNTER — Other Ambulatory Visit: Payer: Self-pay

## 2019-07-23 ENCOUNTER — Ambulatory Visit (INDEPENDENT_AMBULATORY_CARE_PROVIDER_SITE_OTHER): Payer: Medicare Other | Admitting: Cardiovascular Disease

## 2019-07-23 VITALS — BP 190/88 | HR 60 | Ht 66.0 in | Wt 131.0 lb

## 2019-07-23 DIAGNOSIS — I10 Essential (primary) hypertension: Secondary | ICD-10-CM | POA: Diagnosis not present

## 2019-07-23 DIAGNOSIS — R0602 Shortness of breath: Secondary | ICD-10-CM | POA: Diagnosis not present

## 2019-07-23 DIAGNOSIS — I483 Typical atrial flutter: Secondary | ICD-10-CM

## 2019-07-23 DIAGNOSIS — R55 Syncope and collapse: Secondary | ICD-10-CM

## 2019-07-23 DIAGNOSIS — R079 Chest pain, unspecified: Secondary | ICD-10-CM

## 2019-07-23 DIAGNOSIS — E042 Nontoxic multinodular goiter: Secondary | ICD-10-CM

## 2019-07-23 MED ORDER — AMLODIPINE BESYLATE 5 MG PO TABS: ORAL_TABLET | ORAL | 6 refills | Status: DC

## 2019-07-23 MED ORDER — CLONIDINE HCL 0.1 MG PO TABS
0.2000 mg | ORAL_TABLET | Freq: Once | ORAL | Status: AC
  Administered 2019-07-23: 0.2 mg via ORAL

## 2019-07-23 NOTE — Patient Instructions (Addendum)
Medication Instructions:  - Your physician has recommended you make the following change in your medication:   1) Increase norvasc (amlodipine) to 5 mg- take 1 tablet by mouth once daily at dinner time  - Please call with blood pressures in 5-7 days  - take your blood pressure: - when you first get up - 2 hours after your morning medications - in the afternoon  2) you were given clonidine 0.2 mg x 1 dose in clinic this morning @ 9:15 am - please continue to check your blood pressure today (around lunch time & dinner time) - If the top number on your blood pressure is 100 or below, then do not take amlodipine tonight  If you need a refill on your cardiac medications before your next appointment, please call your pharmacy.   Lab work: No new labs needed   If you have labs (blood work) drawn today and your tests are completely normal, you will receive your results only by: Marland Kitchen MyChart Message (if you have MyChart) OR . A paper copy in the mail If you have any lab test that is abnormal or we need to change your treatment, we will call you to review the results.   Testing/Procedures: - Your physician has recommended a thyroid ultrasound to follow up on some thyroid nodules noted on your last carotid ultrasound.  Wednesday 08/01/19 Arrive at 12:45 pm (1:00 pm appointment time) [facility] [facility] [address] [place]. No special instructions   Follow-Up: At [facility], you and your health needs are our priority.  As part of our continuing mission to provide you with exceptional heart care, we have created designated Provider Care Teams.  These Care Teams include your primary Cardiologist (physician) and Advanced Practice Providers (APPs -  Physician Assistants and Nurse Practitioners) who all work together to provide you with the care you need, when you need it.  . You will need a follow up appointment in 6 months (April 2021)  . Please call our office 2 months in advance to schedule  this appointment.  (Call in early February 2021 to schedule)  . Providers on your designated Care Team:   . Murray Hodgkins, NP . Christell Faith, PA-C . Marrianne Mood, PA-C  Any Other Special Instructions Will Be Listed Below (If Applicable).  For educational health videos Log in to : [email or web address] Or : [email or web address], password : [email or web address]

## 2019-07-30 ENCOUNTER — Other Ambulatory Visit: Payer: Self-pay | Admitting: Internal Medicine

## 2019-08-01 ENCOUNTER — Other Ambulatory Visit: Payer: Self-pay

## 2019-08-01 ENCOUNTER — Ambulatory Visit
Admission: RE | Admit: 2019-08-01 | Discharge: 2019-08-01 | Disposition: A | Discharge status: Home / Self Care | Payer: Medicare Other | Source: Ambulatory Visit | Attending: Cardiovascular Disease | Admitting: Cardiovascular Disease

## 2019-08-01 DIAGNOSIS — E042 Nontoxic multinodular goiter: Secondary | ICD-10-CM | POA: Insufficient documentation

## 2019-08-13 ENCOUNTER — Ambulatory Visit (INDEPENDENT_AMBULATORY_CARE_PROVIDER_SITE_OTHER): Payer: Medicare Other | Admitting: Internal Medicine

## 2019-08-13 ENCOUNTER — Other Ambulatory Visit: Payer: Self-pay

## 2019-08-13 ENCOUNTER — Encounter: Payer: Self-pay | Admitting: Internal Medicine

## 2019-08-13 DIAGNOSIS — I10 Essential (primary) hypertension: Secondary | ICD-10-CM

## 2019-08-13 DIAGNOSIS — R197 Diarrhea, unspecified: Secondary | ICD-10-CM

## 2019-08-13 DIAGNOSIS — R945 Abnormal results of liver function studies: Secondary | ICD-10-CM | POA: Diagnosis not present

## 2019-08-13 DIAGNOSIS — E7800 Pure hypercholesterolemia, unspecified: Secondary | ICD-10-CM

## 2019-08-13 DIAGNOSIS — I483 Typical atrial flutter: Secondary | ICD-10-CM | POA: Diagnosis not present

## 2019-08-13 DIAGNOSIS — R7989 Other specified abnormal findings of blood chemistry: Secondary | ICD-10-CM

## 2019-08-13 DIAGNOSIS — L659 Nonscarring hair loss, unspecified: Secondary | ICD-10-CM

## 2019-08-13 NOTE — Assessment & Plan Note (Signed)
Follow liver function tests.   

## 2019-08-13 NOTE — Assessment & Plan Note (Signed)
Persistent.  Labs unrevealing.  Has appt with dermatology scheduled in the future.

## 2019-08-13 NOTE — Assessment & Plan Note (Signed)
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  Desires not to restart.  Follow lipid panel and liver function tests.

## 2019-08-13 NOTE — Assessment & Plan Note (Signed)
Reports no increased heart rate or palpitations.  Followed by cardiology.  On eliquis.  Stable.

## 2019-08-13 NOTE — Assessment & Plan Note (Signed)
Intermittent episodes - occurs after days of no bowel movement.  Saw GI.  Start citrucel.  Has f/u with GI in the next few weeks.

## 2019-08-13 NOTE — Assessment & Plan Note (Signed)
Blood pressure doing much better on current medication regimen.  Follow pressures.  Follow metabolic panel.

## 2019-08-13 NOTE — Progress Notes (Signed)
Patient ID: Anita Benjamin, female   DOB: 08/10/1934, 85 y.o.   MRN: [id number]   Virtual Visit via telephone Note  This visit type was conducted due to national recommendations for restrictions regarding the COVID-19 pandemic (e.g. social distancing).  This format is felt to be most appropriate for this patient at this time.  All issues noted in this document were discussed and addressed.  No physical exam was performed (except for noted visual exam findings with Video Visits).   I connected with [name] by telephone and verified that I am speaking with the correct person using two identifiers. Location patient: home Location provider: work Persons participating in the telephone visit: patient, provider  I discussed the limitations, risks, security and privacy concerns of performing an evaluation and management service by telephone and the availability of in person appointments.  The patient expressed understanding and agreed to proceed.   Reason for visit: scheduled follow up.   HPI: She reports she is doing relatively well.  Having intermittent episodes of loose stool. Will go several days with no bowel movement and then will have the diarrhea/loose stool.  No blood.  States will have pain in her stomach prior to a bowel movement.  Once has a bowel movement, discomfort resolves.  Discussed taking citrucel to help regulate bowels. Is on pepcid 20mg  bid.  Was taking 10mg  q day.  Discussed changing to 10mg  bid.  No acid reflux.  Blood pressure is doing better.  Cardiology added amlodipine.  Pressure better since starting amlodipine.  Has noticed some hand tremor. Notices only when trying to write.  Does not notice at other times.  Discussed further w/up.  She declines.  Wants to monitor.  Also reports hair loss.  Recent labs unrevealing.  Discussed dermatology referral.  She has one scheduled in the future.     ROS: See pertinent positives and negatives per HPI.  Past Medical History:   Diagnosis Date  . Anemia   . Atrial flutter ([place])   . Fibrocystic breast disease   . History of chicken pox   . Hypercholesterolemia   . Hypertension     Past Surgical History:  Procedure Laterality Date  . BREAST BIOPSY Right 2014   NEG  . Laser repair of torn retina      Family History  Problem Relation Age of Onset  . Heart disease Mother 9       Heart attack  . Cancer Father 15       lung cancer  . Breast cancer Paternal Aunt        x3  . Colon cancer Neg Hx     SOCIAL HX: reviewed.    Current Outpatient Medications:  .  Acetaminophen (TYLENOL ARTHRITIS PAIN PO), Take 1 tablet by mouth 2 (two) times daily., Disp: , Rfl:  .  amLODipine (NORVASC) 5 MG tablet, Take 1 tablet (5 mg) by mouth once daily at dinner time, Disp: 30 tablet, Rfl: 6 .  Ascorbic Acid (VITAMIN C) 250 MG CHEW, Chew by mouth daily., Disp: , Rfl:  .  calcium citrate-vitamin D 500-500 MG-UNIT chewable tablet, Chew 1 tablet by mouth daily., Disp: , Rfl:  .  ELIQUIS 2.5 MG TABS tablet, TAKE 1 TABLET BY MOUTH TWICE DAILY, Disp: 180 tablet, Rfl: 1 .  famotidine (PEPCID) 20 MG tablet, Take 20 mg by mouth daily., Disp: , Rfl:  .  loperamide (IMODIUM A-D) 2 MG tablet, Take 1 tablet (2 mg total) by mouth 4 (four) times daily  as needed for diarrhea or loose stools., Disp: 30 tablet, Rfl: 0 .  losartan (COZAAR) 100 MG tablet, TAKE 1 TABLET BY MOUTH ONCE DAILY, Disp: 30 tablet, Rfl: 2 .  metoprolol succinate (TOPROL-XL) 25 MG 24 hr tablet, Take 1 tablet (25 mg total) by mouth daily as needed., Disp: 30 tablet, Rfl: 1 .  Multiple Vitamin (MULTIVITAMIN) tablet, Take 1 tablet by mouth daily., Disp: , Rfl:  .  NON FORMULARY, ViaActiv Takes 1 tablet by mouth daily., Disp: , Rfl:  .  sertraline (ZOLOFT) 50 MG tablet, TAKE 1/2 TABLET BY MOUTH AT BEDTIME, Disp: 30 tablet, Rfl: 2  EXAM:  VITALS per patient if applicable: XX123456  GENERAL: alert.  Sounds to be in no acute distress.  Answering questions appropriately.     PSYCH/NEURO: pleasant and cooperative, no obvious depression or anxiety, speech and thought processing grossly intact  ASSESSMENT AND PLAN:  Discussed the following assessment and plan:  Abnormal liver function test Follow liver function tests.    Atrial flutter Reports no increased heart rate or palpitations.  Followed by cardiology.  On eliquis.  Stable.   Diarrhea Intermittent episodes - occurs after days of no bowel movement.  Saw GI.  Start citrucel.  Has f/u with GI in the next few weeks.    Essential hypertension Blood pressure doing much better on current medication regimen.  Follow pressures.  Follow metabolic panel.   Hair loss Persistent.  Labs unrevealing.  Has appt with dermatology scheduled in the future.   Hypercholesterolemia Previously tried crestor.  Desires not to restart.  Follow lipid panel and liver function tests.      I discussed the assessment and treatment plan with the patient. The patient was provided an opportunity to ask questions and all were answered. The patient agreed with the plan and demonstrated an understanding of the instructions.   The patient was advised to call back or seek an in-person evaluation if the symptoms worsen or if the condition fails to improve as anticipated.  I provided 25 minutes of non-face-to-face time during this encounter.   Einar Pheasant, MD

## 2019-10-02 ENCOUNTER — Ambulatory Visit: Payer: Medicare Other | Admitting: Internal Medicine

## 2019-10-12 ENCOUNTER — Encounter: Payer: Self-pay | Admitting: Internal Medicine

## 2019-10-12 ENCOUNTER — Other Ambulatory Visit: Payer: Self-pay

## 2019-10-12 ENCOUNTER — Ambulatory Visit (INDEPENDENT_AMBULATORY_CARE_PROVIDER_SITE_OTHER): Payer: Medicare Other | Admitting: Internal Medicine

## 2019-10-12 VITALS — BP 139/73 | HR 61 | Ht 67.0 in | Wt 130.0 lb

## 2019-10-12 DIAGNOSIS — I483 Typical atrial flutter: Secondary | ICD-10-CM | POA: Diagnosis not present

## 2019-10-12 DIAGNOSIS — E7800 Pure hypercholesterolemia, unspecified: Secondary | ICD-10-CM | POA: Diagnosis not present

## 2019-10-12 DIAGNOSIS — R197 Diarrhea, unspecified: Secondary | ICD-10-CM

## 2019-10-12 DIAGNOSIS — I10 Essential (primary) hypertension: Secondary | ICD-10-CM

## 2019-10-12 DIAGNOSIS — F439 Reaction to severe stress, unspecified: Secondary | ICD-10-CM

## 2019-10-12 NOTE — Progress Notes (Signed)
Patient ID: Anita Benjamin, female   DOB: 03-24-1934, 86 y.o.   MRN: [id number]   Virtual Visit via telephone Note  This visit type was conducted due to national recommendations for restrictions regarding the COVID-19 pandemic (e.g. social distancing).  This format is felt to be most appropriate for this patient at this time.  All issues noted in this document were discussed and addressed.  No physical exam was performed (except for noted visual exam findings with Video Visits).   I connected with Anita Benjamin by telephone and verified that I am speaking with the correct person using two identifiers. Location patient: home Location provider: work  Persons participating in the telephone visit: patient, provider  I discussed the limitations, risks, security and privacy concerns of performing an evaluation and management service by telephone and the availability of in person appointments.  The patient expressed understanding and agreed to proceed.   Reason for visit: scheduled follow up.   HPI: She reports she is doing better.  Does feel better.  Last visit, had reported intermittent episodes of loose stool.  Described - will go days without a bowel movement and then will have diarrhea and loose stool - to empty out.  Previously saw GI.  Instructed to use citrucel.  Is taking pepcid.  States this is helping.  Reports abdominal pain not as bad.  Still has some episodes with her bowels, but overall does appear to be some better.  She is eating.  No chest pain.  No significant problems with increased heart rate.  Previously saw cardiology.  Amlodipine added - due to persistent elevated blood pressure.  Blood pressures doing better - 114-139/60-70s.  She did have a fall while playing basketball with her grandchildren.  Hit her elbow and shoulder.  She is able to lift without difficulty.  No head injury.  Desires no further evaluation.  No headache.  States occasionally if gets up fast, will feel a little wobbly.   This resolves quickly.     ROS: See pertinent positives and negatives per HPI.  Past Medical History:  Diagnosis Date  . Anemia   . Atrial flutter ([place])   . Fibrocystic breast disease   . History of chicken pox   . Hypercholesterolemia   . Hypertension     Past Surgical History:  Procedure Laterality Date  . BREAST BIOPSY Right 2014   NEG  . Laser repair of torn retina      Family History  Problem Relation Age of Onset  . Heart disease Mother 75       Heart attack  . Cancer Father 46       lung cancer  . Breast cancer Paternal Aunt        x3  . Colon cancer Neg Hx     SOCIAL HX: reviewed.    Current Outpatient Medications:  .  Acetaminophen (TYLENOL ARTHRITIS PAIN PO), Take 1 tablet by mouth 2 (two) times daily., Disp: , Rfl:  .  amLODipine (NORVASC) 5 MG tablet, Take 1 tablet (5 mg) by mouth once daily at dinner time, Disp: 30 tablet, Rfl: 6 .  Ascorbic Acid (VITAMIN C) 250 MG CHEW, Chew by mouth daily., Disp: , Rfl:  .  calcium citrate-vitamin D 500-500 MG-UNIT chewable tablet, Chew 1 tablet by mouth daily., Disp: , Rfl:  .  ELIQUIS 2.5 MG TABS tablet, TAKE 1 TABLET BY MOUTH TWICE DAILY, Disp: 180 tablet, Rfl: 1 .  famotidine (PEPCID) 20 MG tablet, Take 20 mg by  mouth daily., Disp: , Rfl:  .  loperamide (IMODIUM A-D) 2 MG tablet, Take 1 tablet (2 mg total) by mouth 4 (four) times daily as needed for diarrhea or loose stools., Disp: 30 tablet, Rfl: 0 .  losartan (COZAAR) 100 MG tablet, TAKE 1 TABLET BY MOUTH ONCE DAILY, Disp: 30 tablet, Rfl: 2 .  metoprolol succinate (TOPROL-XL) 25 MG 24 hr tablet, Take 1 tablet (25 mg total) by mouth daily as needed., Disp: 30 tablet, Rfl: 1 .  Multiple Vitamin (MULTIVITAMIN) tablet, Take 1 tablet by mouth daily., Disp: , Rfl:  .  NON FORMULARY, ViaActiv Takes 1 tablet by mouth daily., Disp: , Rfl:  .  sertraline (ZOLOFT) 50 MG tablet, TAKE 1/2 TABLET BY MOUTH AT BEDTIME, Disp: 30 tablet, Rfl: 2  EXAM:  VITALS per patient if  applicable:  123456  GENERAL: alert.  Sounds to be in no acute distress.  Answering questions appropriately.   PSYCH/NEURO: pleasant and cooperative, no obvious depression or anxiety, speech and thought processing grossly intact  ASSESSMENT AND PLAN:  Discussed the following assessment and plan:  Atrial flutter Overall doing well currently.  Is followed by cardiology.  Continue current medication regimen.   Diarrhea Intermittent episodes as outlined.  Saw GI.  Recommended citrucel.  Some better now.  Taking pepcid.  Follow. She desires no further f/u or w/up at this time.    Essential hypertension Blood pressure better on current regimen. Continue current medication regimen.  Follow pressures.  Follow metabolic panel.   Hypercholesterolemia Previously tried crestor.  Desires not to restart.  Follow lipid panel.   Stress On zoloft.  Doing well on this medication.  Overall she feels she is handling things well.  Follow.     Orders Placed This Encounter  Procedures  . Lipid panel    Standing Status:   Future    Standing Expiration Date:   10/11/2020  . Hepatic function panel    Standing Status:   Future    Standing Expiration Date:   10/11/2020  . Basic metabolic panel (future)    Standing Status:   Future    Standing Expiration Date:   10/11/2020     I discussed the assessment and treatment plan with the patient. The patient was provided an opportunity to ask questions and all were answered. The patient agreed with the plan and demonstrated an understanding of the instructions.   The patient was advised to call back or seek an in-person evaluation if the symptoms worsen or if the condition fails to improve as anticipated.  I provided 25 minutes of non-face-to-face time during this encounter.   Einar Pheasant, MD

## 2019-10-20 ENCOUNTER — Other Ambulatory Visit: Payer: Self-pay | Admitting: Cardiovascular Disease

## 2019-10-21 ENCOUNTER — Encounter: Payer: Self-pay | Admitting: Internal Medicine

## 2019-10-21 NOTE — Assessment & Plan Note (Signed)
On zoloft.  Doing well on this medication.  Overall she feels she is handling things well.  Follow.

## 2019-10-21 NOTE — Assessment & Plan Note (Signed)
Intermittent episodes as outlined.  Saw GI.  Recommended citrucel.  Some better now.  Taking pepcid.  Follow. She desires no further f/u or w/up at this time.

## 2019-10-21 NOTE — Assessment & Plan Note (Signed)
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  Desires not to restart.  Follow lipid panel.

## 2019-10-21 NOTE — Assessment & Plan Note (Addendum)
Blood pressure better on current regimen. Continue current medication regimen.  Follow pressures.  Follow metabolic panel.

## 2019-10-21 NOTE — Assessment & Plan Note (Signed)
Overall doing well currently.  Is followed by cardiology.  Continue current medication regimen.

## 2019-10-22 NOTE — Telephone Encounter (Signed)
Refill Request.  

## 2019-10-22 NOTE — Telephone Encounter (Signed)
Pt's age 86, wt 65 kg, SCr 1.06, CrCl 37.45, last ov w/ TG 07/23/19.

## 2019-11-12 ENCOUNTER — Other Ambulatory Visit (INDEPENDENT_AMBULATORY_CARE_PROVIDER_SITE_OTHER): Payer: Medicare Other

## 2019-11-12 ENCOUNTER — Other Ambulatory Visit: Payer: Self-pay

## 2019-11-12 DIAGNOSIS — I10 Essential (primary) hypertension: Secondary | ICD-10-CM | POA: Diagnosis not present

## 2019-11-12 DIAGNOSIS — E7800 Pure hypercholesterolemia, unspecified: Secondary | ICD-10-CM

## 2019-11-12 LAB — HEPATIC FUNCTION PANEL
ALT: 14 U/L (ref 0–35)
AST: 17 U/L (ref 0–37)
Albumin: 4.2 g/dL (ref 3.5–5.2)
Alkaline Phosphatase: 62 U/L (ref 39–117)
Bilirubin, Direct: 0.1 mg/dL (ref 0.0–0.3)
Total Bilirubin: 0.6 mg/dL (ref 0.2–1.2)
Total Protein: 6.5 g/dL (ref 6.0–8.3)

## 2019-11-12 LAB — LIPID PANEL
Cholesterol: 207 mg/dL — ABNORMAL HIGH (ref 0–200)
HDL: 69.6 mg/dL (ref >39.00)
LDL Cholesterol: 116 mg/dL — ABNORMAL HIGH (ref 0–99)
NonHDL: 137.79
Total CHOL/HDL Ratio: 3
Triglycerides: 111 mg/dL (ref 0.0–149.0)
VLDL: 22.2 mg/dL (ref 0.0–40.0)

## 2019-11-12 LAB — BASIC METABOLIC PANEL
BUN: 18 mg/dL (ref 6–23)
CO2: 30 mEq/L (ref 19–32)
Calcium: 9.5 mg/dL (ref 8.4–10.5)
Chloride: 104 mEq/L (ref 96–112)
Creatinine, Ser: 0.92 mg/dL (ref 0.40–1.20)
GFR: 58.2 mL/min — ABNORMAL LOW (ref >60.00)
Glucose, Bld: 85 mg/dL (ref 70–99)
Potassium: 4 mEq/L (ref 3.5–5.1)
Sodium: 141 mEq/L (ref 135–145)

## 2019-11-23 ENCOUNTER — Telehealth: Payer: Self-pay | Admitting: Internal Medicine

## 2019-11-23 NOTE — Telephone Encounter (Signed)
Pt called returning [place] call

## 2019-11-29 ENCOUNTER — Other Ambulatory Visit: Payer: Self-pay | Admitting: Internal Medicine

## 2020-01-19 NOTE — Progress Notes (Signed)
Cardiology Office Note  Date:  01/21/2020   ID:  Anita Benjamin, DOB [birth year], MRN [id number]  PCP:  Einar Pheasant, MD   Chief Complaint  Patient presents with  . office visit    Pt has no complaints. Meds verbally reviewed w/ pt.    HPI:  Anita Benjamin is a very pleasant 86 y.o. woman, with a history of  tachycardia, SVT episodes,  atrial flutter, hypotension. several year history, possibly even up to 10 years of tachyarrhythmia. High BP in doctor offices Bradycardia 2019 on beta-blocker and amiodarone for flutter She presents today for follow-up of her tachycardia and chest pain  In follow-up today reports she is doing relatively well She continues to have paroxysmal tachycardia  3-4 episodes of tachycardia 2021 Approximately 1 every 6 weeks Typically will last 3 to 4 hours at a time Heart rate up to 145, fastest <1 one per month  Last sat heart rate up, took metoprolol 25 x1 Resolved 3 1/2 hours later,  Had some nausea, dizzy  Denies any chest pain or shortness of breath No regular exercise program No near syncope syncope or orthostasis symptoms We discussed previous vasovagal episode, no further symptoms  EKG personally reviewed by myself on todays visit Shows sinus bradycardia rhythm. 59 bpm.  No significant ST or T wave changes noted  Other past medical history reviewed  syncope 05/23/2019  occurring after eating a spicy sandwich leading to abdominal pain and diarrhea.  feeling sick Felt to be vasovagal syncope was bending over and does not remember falling.  Came to on the ground.  Did not feel bad prior to this last episode.   Has episodes of urgency and diarrhea every few weeks/once a month Bad morning today coming into the office, had spicy pizza last night, and apple, likely found the bathroom in time.  Had diarrhea Has appt with GI tomorrow  echo and carotid ultrasound in 10.2020.   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  (possibly amiodarone).  Off amiodarone now.   Echocardiogram, 1. Left ventricular ejection fraction, by visual estimation, is 60 to 65%. The left ventricle has normal function. Normal left ventricular size. There is no left ventricular hypertrophy.  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  Normal carotid ultrasound.  Incidental findings of multiple thyroid nodules in the bilateral lobes of the thyroid. Two largest on the right measured 0.8x0.5cm and 1.1x1.0 cm. On the left they measured 1.0x0.5cm and 0.8x0.6cm    November 2019 for atrial flutter, started on amiodarone, early later converted to normal sinus rhythm Metoprolol held for bradycardia Remains on amiodarone 200 mg daily    OTHER PAST MEDICAL HISTORY REVIEWED BY ME FOR TODAY'S VISIT: 10/01/2016 she had atrial flutter rate 120 bpm  started on amiodarone, metoprolol, eliquis.  She converted back to normal sinus rhythm  Mother died at age 34, found down, etiology unclear   family history, father had lung cancer, possible heart issues, died at age 46. Mother had a stroke, died at 35  In terms of her social history, she is not a smoker with no smoking history, lives at home with her husband. Is retired Total cholesterol 211  PMH:   has a past medical history of Anemia, Atrial flutter ([place]), Fibrocystic breast disease, History of chicken pox, Hypercholesterolemia, and Hypertension.  PSH:    Past Surgical History:  Procedure Laterality Date  . BREAST BIOPSY Right 2014   NEG  . Laser repair of torn retina      Current Outpatient Medications  Medication Sig Dispense Refill  . Acetaminophen (TYLENOL ARTHRITIS PAIN PO) Take 1 tablet by mouth 2 (two) times daily.    Marland Kitchen  amLODipine (NORVASC) 5 MG tablet Take 1 tablet (5 mg) by mouth once daily at dinner time 30 tablet 6  . Ascorbic Acid (VITAMIN C) 250 MG CHEW Chew by mouth daily.    . calcium citrate-vitamin D 500-500 MG-UNIT chewable tablet Chew 1 tablet by mouth daily.    Marland Kitchen ELIQUIS 2.5 MG TABS tablet TAKE 1 TABLET BY MOUTH TWICE DAILY 180 tablet 1  . famotidine (PEPCID) 20 MG tablet Take 20 mg by mouth daily.    Marland Kitchen losartan (COZAAR) 100 MG tablet TAKE 1 TABLET BY MOUTH ONCE DAILY 30 tablet 2  . metoprolol succinate (TOPROL-XL) 25 MG 24 hr tablet Take 1 tablet (25 mg total) by mouth daily as needed. 30 tablet 1  . Multiple Vitamin (MULTIVITAMIN) tablet Take 1 tablet by mouth daily.    . NON FORMULARY ViaActiv Takes 1 tablet by mouth daily.    . sertraline (ZOLOFT) 50 MG tablet TAKE 1/2 TABLET BY MOUTH AT BEDTIME 30 tablet 2   No current facility-administered medications for this visit.     Allergies:   Oruvail [ketoprofen], Relafen [nabumetone], and Timentin [ticarcillin-pot clavulanate]   Social History:  The patient  reports that she has never smoked. She has never used smokeless tobacco. She reports that she does not drink alcohol or use drugs.   Family History:   family history includes Breast cancer in her paternal aunt; Cancer (age of onset: 20) in her father; Heart disease (age of onset: 17) in her mother.    Review of Systems: Review of Systems  Constitutional: Negative.   HENT: Negative.   Eyes: Negative.   Respiratory: Negative.   Cardiovascular: Negative.  Negative for palpitations.       Paroxysmal tachycardia  Gastrointestinal: Negative.   Genitourinary: Negative.   Musculoskeletal: Negative.   Neurological: Negative.   Psychiatric/Behavioral: Negative.   All other systems reviewed and are negative.   PHYSICAL EXAM: VS:  BP 140/68 (BP Location: Left Arm, Patient Position: Sitting, Cuff Size: Normal)   Pulse (!) 59   Ht 5\' 7"  (1.702 m)   Wt 130 lb 8 oz (59.2 kg)   LMP  10/27/1989   SpO2 98%   BMI 20.44 kg/m  , BMI Body mass index is 20.44 kg/m.  Constitutional:  oriented to person, place, and time. No distress.  HENT:  Head: Grossly normal Eyes:  no discharge. No scleral icterus.  Neck: No JVD, no carotid bruits  Cardiovascular: Regular rate and rhythm, no murmurs appreciated Pulmonary/Chest: Clear to auscultation bilaterally, no wheezes or rails Abdominal: Soft.  no distension.  no tenderness.  Musculoskeletal: Normal range of motion Neurological:  normal muscle tone. Coordination normal. No atrophy Skin: Skin warm and dry Psychiatric: normal affect, pleasant  Recent Labs: 05/30/2019: TSH 3.89 06/22/2019: Hemoglobin 12.9; Platelets 265.0 11/12/2019: ALT 14; BUN 18; Creatinine, Ser 0.92; Potassium 4.0; Sodium 141    Lipid Panel Lab Results  Component Value Date   CHOL 207 (H) 11/12/2019   HDL 69.60 11/12/2019   LDLCALC 116 (H) 11/12/2019   TRIG 111.0 11/12/2019      Wt Readings from Last 3 Encounters:  01/21/20 130 lb 8  oz (59.2 kg)  10/12/19 130 lb (59 kg)  08/13/19 127 lb 12.8 oz (58 kg)       ASSESSMENT AND PLAN:  Syncope Prior episode vasovagal syncope No further episodes in the past year  Thyroid nodules Seen on carotid ultrasound,  thyroid ultrasound: Mild thyromegaly with bilateral cysts and complex cysts. None meets criteria for biopsy or dedicated imaging follow-up.  Essential hypertension On prior office visits had markedly elevated blood pressure Numbers well controlled on today's visit  Typical atrial flutter ([place]) Continue anticoagulation She is having paroxysmal tachycardia Propranolol and diltiazem as needed for episodes Metoprolol succinate taking too long to kick in, episodes lasting 3 to 4 hours even on metoprolol succinate  Chronic fatigue Poor sleep hygiene Doing better on today's visit  Chest pain Denies having any chest pain on exertion  Encounter for anticoagulation discussion and  counseling Plan: Tolerating anticoagulation, Eliquis   Total encounter time more than 25 minutes  Greater than 50% was spent in counseling and coordination of care with the patient   Disposition:   F/U  12 months   Orders Placed This Encounter  Procedures  . EKG 12-Lead    Signed, Esmond Plants, M.D., Ph.D. 01/21/2020  [place], [place]

## 2020-01-21 ENCOUNTER — Other Ambulatory Visit: Payer: Self-pay

## 2020-01-21 ENCOUNTER — Ambulatory Visit (INDEPENDENT_AMBULATORY_CARE_PROVIDER_SITE_OTHER): Payer: Medicare Other | Admitting: Cardiovascular Disease

## 2020-01-21 ENCOUNTER — Encounter: Payer: Self-pay | Admitting: Cardiovascular Disease

## 2020-01-21 VITALS — BP 140/68 | HR 59 | Ht 67.0 in | Wt 130.5 lb

## 2020-01-21 DIAGNOSIS — E7800 Pure hypercholesterolemia, unspecified: Secondary | ICD-10-CM

## 2020-01-21 DIAGNOSIS — I483 Typical atrial flutter: Secondary | ICD-10-CM | POA: Diagnosis not present

## 2020-01-21 DIAGNOSIS — R55 Syncope and collapse: Secondary | ICD-10-CM | POA: Diagnosis not present

## 2020-01-21 DIAGNOSIS — R0602 Shortness of breath: Secondary | ICD-10-CM | POA: Diagnosis not present

## 2020-01-21 DIAGNOSIS — R079 Chest pain, unspecified: Secondary | ICD-10-CM | POA: Diagnosis not present

## 2020-01-21 DIAGNOSIS — I10 Essential (primary) hypertension: Secondary | ICD-10-CM

## 2020-01-21 MED ORDER — PROPRANOLOL HCL 20 MG PO TABS: 20.0000 mg | ORAL_TABLET | Freq: Three times a day (TID) | ORAL | 3 refills | Status: DC | PRN

## 2020-01-21 MED ORDER — PROPRANOLOL HCL 10 MG PO TABS: 10.0000 mg | ORAL_TABLET | Freq: Three times a day (TID) | ORAL | 3 refills | Status: DC | PRN

## 2020-01-21 MED ORDER — DILTIAZEM HCL 30 MG PO TABS: 30.0000 mg | ORAL_TABLET | Freq: Three times a day (TID) | ORAL | 3 refills | Status: DC | PRN

## 2020-01-21 NOTE — Patient Instructions (Addendum)
Medication Instructions:  Your physician has recommended you make the following change in your medication:   For tachycardia (fast heart rate) spell  Take propranolol 20 mg 1 pill  One hour later take Diltiazem 30 mg 1 pill Rest  Wait for arrhythmia to resolve  If you need a refill on your cardiac medications before your next appointment, please call your pharmacy.    Lab work: No new labs needed   If you have labs (blood work) drawn today and your tests are completely normal, you will receive your results only by: Marland Kitchen MyChart Message (if you have MyChart) OR . A paper copy in the mail If you have any lab test that is abnormal or we need to change your treatment, we will call you to review the results.   Testing/Procedures: No new testing needed   Follow-Up: At [facility], you and your health needs are our priority.  As part of our continuing mission to provide you with exceptional heart care, we have created designated Provider Care Teams.  These Care Teams include your primary Cardiologist (physician) and Advanced Practice Providers (APPs -  Physician Assistants and Nurse Practitioners) who all work together to provide you with the care you need, when you need it.  . You will need a follow up appointment in 12 months .  Marland Kitchen Providers on your designated Care Team:   . Murray Hodgkins, NP . Christell Faith, PA-C . Marrianne Mood, PA-C  Any Other Special Instructions Will Be Listed Below (If Applicable).  For educational health videos Log in to : [email or web address] Or : [email or web address], password : [email or web address]

## 2020-02-15 ENCOUNTER — Encounter: Payer: Self-pay | Admitting: Internal Medicine

## 2020-02-15 ENCOUNTER — Ambulatory Visit: Payer: Medicare Other | Admitting: Internal Medicine

## 2020-02-15 ENCOUNTER — Other Ambulatory Visit: Payer: Self-pay | Admitting: Internal Medicine

## 2020-02-15 ENCOUNTER — Other Ambulatory Visit: Payer: Self-pay

## 2020-02-15 VITALS — BP 132/74 | HR 61 | Temp 97.7°F | Resp 16 | Ht 67.0 in | Wt 130.0 lb

## 2020-02-15 DIAGNOSIS — I10 Essential (primary) hypertension: Secondary | ICD-10-CM | POA: Diagnosis not present

## 2020-02-15 DIAGNOSIS — Z0000 Encounter for general adult medical examination without abnormal findings: Secondary | ICD-10-CM | POA: Diagnosis not present

## 2020-02-15 DIAGNOSIS — R000 Tachycardia, unspecified: Secondary | ICD-10-CM | POA: Diagnosis not present

## 2020-02-15 DIAGNOSIS — I483 Typical atrial flutter: Secondary | ICD-10-CM

## 2020-02-15 DIAGNOSIS — E7800 Pure hypercholesterolemia, unspecified: Secondary | ICD-10-CM | POA: Diagnosis not present

## 2020-02-15 DIAGNOSIS — Z1231 Encounter for screening mammogram for malignant neoplasm of breast: Secondary | ICD-10-CM

## 2020-02-15 DIAGNOSIS — F439 Reaction to severe stress, unspecified: Secondary | ICD-10-CM | POA: Diagnosis not present

## 2020-02-15 NOTE — Assessment & Plan Note (Addendum)
Physical today 02/15/20.  Saw GI.  cologuard negative.  Mammogram 04/2018 - Birads I.  Schedule f/u mammogram.

## 2020-02-15 NOTE — Progress Notes (Signed)
Patient ID: Anita Benjamin, female   DOB: [birth year], 86 y.o.   MRN: [id number]   Subjective:    Patient ID: Anita Benjamin, female    DOB: [birth year], 86 y.o.   MRN: [id number]  HPI This visit occurred during the SARS-CoV-2 public health emergency.  Safety protocols were in place, including screening questions prior to the visit, additional usage of staff PPE, and extensive cleaning of exam room while observing appropriate contact time as indicated for disinfecting solutions.  Patient here for her physical exam.  She reports she is doing relatively well.  Sees Dr Rockey Situ for f/u atrial flutter.  Having paroxysmal tachycardia.  Had episode 02/05/20 and last night.  When saw Dr Rockey Situ - metoprolol stopped.  Uses diltiazem and propranolol as needed.  Took one last night.  Episode resolved after 1.5 hours.  No chest pain.  Breathing stable.  No acid reflux reported.  No abdominal pain.  Diarrhea better.  Taking citracel.  Discussed taking regularly.  No actual diarrhea now.  Handling stress.    Past Medical History:  Diagnosis Date  . Anemia   . Atrial flutter ([place])   . Fibrocystic breast disease   . History of chicken pox   . Hypercholesterolemia   . Hypertension    Past Surgical History:  Procedure Laterality Date  . BREAST BIOPSY Right 2014   NEG  . Laser repair of torn retina     Family History  Problem Relation Age of Onset  . Heart disease Mother 39       Heart attack  . Cancer Father 37       lung cancer  . Breast cancer Paternal Aunt        x3  . Colon cancer Neg Hx    Social History   Socioeconomic History  . Marital status: Widowed    Spouse name: Not on file  . Number of children: 2  . Years of education: Not on file  . Highest education level: Not on file  Occupational History  . Not on file  Tobacco Use  . Smoking status: Never Smoker  . Smokeless tobacco: Never Used  Substance and Sexual Activity  . Alcohol use: No    Alcohol/week: 0.0 standard drinks  . Drug use: No    . Sexual activity: Not on file  Other Topics Concern  . Not on file  Social History Narrative  . Not on file   Social Determinants of Health   Financial Resource Strain:   . Difficulty of Paying Living Expenses:   Food Insecurity:   . Worried About Charity fundraiser in the Last Year:   . Arboriculturist in the Last Year:   Transportation Needs:   . Film/video editor (Medical):   Marland Kitchen Lack of Transportation (Non-Medical):   Physical Activity:   . Days of Exercise per Week:   . Minutes of Exercise per Session:   Stress:   . Feeling of Stress :   Social Connections:   . Frequency of Communication with Friends and Family:   . Frequency of Social Gatherings with Friends and Family:   . Attends Religious Services:   . Active Member of Clubs or Organizations:   . Attends Archivist Meetings:   Marland Kitchen Marital Status:     Outpatient Encounter Medications as of 02/15/2020  Medication Sig  . Acetaminophen (TYLENOL ARTHRITIS PAIN PO) Take 1 tablet by mouth 2 (two) times daily.  Marland Kitchen amLODipine (NORVASC) 5 MG tablet  Take 1 tablet (5 mg) by mouth once daily at dinner time  . Ascorbic Acid (VITAMIN C) 250 MG CHEW Chew by mouth daily.  . calcium citrate-vitamin D 500-500 MG-UNIT chewable tablet Chew 1 tablet by mouth daily.  Marland Kitchen diltiazem (CARDIZEM) 30 MG tablet Take 1 tablet (30 mg total) by mouth 3 (three) times daily as needed (As needed for fast heart rates).  Marland Kitchen ELIQUIS 2.5 MG TABS tablet TAKE 1 TABLET BY MOUTH TWICE DAILY  . famotidine (PEPCID) 20 MG tablet Take 20 mg by mouth daily.  . metoprolol succinate (TOPROL-XL) 25 MG 24 hr tablet Take 1 tablet (25 mg total) by mouth daily as needed.  . Multiple Vitamin (MULTIVITAMIN) tablet Take 1 tablet by mouth daily.  . NON FORMULARY ViaActiv Takes 1 tablet by mouth daily.  . propranolol (INDERAL) 20 MG tablet Take 1 tablet (20 mg total) by mouth 3 (three) times daily as needed (As needed for fast heart rates).  . sertraline (ZOLOFT)  50 MG tablet TAKE 1/2 TABLET BY MOUTH AT BEDTIME  . [DISCONTINUED] losartan (COZAAR) 100 MG tablet TAKE 1 TABLET BY MOUTH ONCE DAILY   No facility-administered encounter medications on file as of 02/15/2020.   Review of Systems  Constitutional: Negative for appetite change and unexpected weight change.  HENT: Negative for congestion and sinus pressure.   Respiratory: Negative for cough, chest tightness and shortness of breath.   Cardiovascular: Negative for chest pain and leg swelling.       Intermittent episodes of increased heart rate.    Gastrointestinal: Negative for abdominal pain, nausea and vomiting.       Bowels better.  No actual diarrhea now.   Genitourinary: Negative for difficulty urinating and dysuria.  Musculoskeletal: Negative for joint swelling and myalgias.  Skin: Negative for color change and rash.  Neurological: Negative for dizziness, light-headedness and headaches.  Psychiatric/Behavioral: Negative for agitation and dysphoric mood.       Objective:    Physical Exam Vitals reviewed.  Constitutional:      General: She is not in acute distress.    Appearance: Normal appearance. She is well-developed.  HENT:     Head: Normocephalic and atraumatic.     Right Ear: External ear normal.     Left Ear: External ear normal.  Eyes:     General: No scleral icterus.       Right eye: No discharge.        Left eye: No discharge.     Conjunctiva/sclera: Conjunctivae normal.  Neck:     Thyroid: No thyromegaly.  Cardiovascular:     Rate and Rhythm: Normal rate and regular rhythm.  Pulmonary:     Effort: No tachypnea, accessory muscle usage or respiratory distress.     Breath sounds: Normal breath sounds. No decreased breath sounds or wheezing.  Chest:     Breasts:        Right: No inverted nipple, mass, nipple discharge or tenderness (no axillary adenopathy).        Left: No inverted nipple, mass, nipple discharge or tenderness (no axilarry adenopathy).  Abdominal:      General: Bowel sounds are normal.     Palpations: Abdomen is soft.     Tenderness: There is no abdominal tenderness.  Musculoskeletal:        General: No tenderness.     Cervical back: Neck supple. No tenderness.  Lymphadenopathy:     Cervical: No cervical adenopathy.  Skin:    Findings: No erythema or  rash.  Neurological:     Mental Status: She is alert and oriented to person, place, and time.  Psychiatric:        Mood and Affect: Mood normal.        Behavior: Behavior normal.     BP 132/74   Pulse 61   Temp 97.7 F (36.5 C)   Resp 16   Ht 5\' 7"  (1.702 m)   Wt 130 lb (59 kg)   LMP 10/27/1989   SpO2 98%   BMI 20.36 kg/m  Wt Readings from Last 3 Encounters:  02/15/20 130 lb (59 kg)  01/21/20 130 lb 8 oz (59.2 kg)  10/12/19 130 lb (59 kg)     Lab Results  Component Value Date   WBC 5.5 06/22/2019   HGB 12.9 06/22/2019   HCT 38.4 06/22/2019   PLT 265.0 06/22/2019   GLUCOSE 85 11/12/2019   CHOL 207 (H) 11/12/2019   TRIG 111.0 11/12/2019   HDL 69.60 11/12/2019   LDLDIRECT 117.7 11/06/2013   LDLCALC 116 (H) 11/12/2019   ALT 14 11/12/2019   AST 17 11/12/2019   NA 141 11/12/2019   K 4.0 11/12/2019   CL 104 11/12/2019   CREATININE 0.92 11/12/2019   BUN 18 11/12/2019   CO2 30 11/12/2019   TSH 3.89 05/30/2019    US THYROID  Result Date: 08/01/2019 CLINICAL DATA:  Nodule noted on carotid ultrasound at outside institution EXAM: THYROID ULTRASOUND TECHNIQUE: Ultrasound examination of the thyroid gland and adjacent soft tissues was performed. COMPARISON:  None. FINDINGS: Parenchymal Echotexture: Mildly heterogenous Isthmus: 0.4 cm thickness Right lobe: 5.7 x 1.7 x 1.4 cm Left lobe: 5.2 x 1.7 x 1.6 cm _________________________________________________________ Estimated total number of nodules >/= 1 cm: 3 Number of spongiform nodules >/=  2 cm not described below (TR1): 0 Number of mixed cystic and solid nodules >/= 1.5 cm not described below (TR2): 0  _________________________________________________________ 0.7 cm benign colloid cyst, isthmus to the left of midline 1.8 x 1.2 cm benign colloid cyst, superior right 0.8 cm complex cyst without calcifications, superior right; This nodule does NOT meet TI-RADS criteria for biopsy or dedicated follow-up. 0.6 cm benign colloid cyst, mid right 0.6 cm benign colloid cyst, posterior upper right Nodule # 6: Location: Left; Superior Maximum size: 1.2 cm; Other 2 dimensions: 1.1 x 0.5 cm Composition: mixed cystic and solid (1) Echogenicity: hypoechoic (2) Shape: not taller-than-wide (0) Margins: smooth (0) Echogenic foci: none (0) ACR TI-RADS total points: 3. ACR TI-RADS risk category: TR3 (3 points). ACR TI-RADS recommendations: Given size (<1.4 cm) and appearance, this nodule does NOT meet TI-RADS criteria for biopsy or dedicated follow-up. _________________________________________________________ 1.1 cm complex cyst without calcifications, posterosuperior left; This nodule does NOT meet TI-RADS criteria for biopsy or dedicated follow-up. 0.8 cm benign colloid cyst, mid left 0.8 cm complex cyst without calcifications, mid left; This nodule does NOT meet TI-RADS criteria for biopsy or dedicated follow-up. IMPRESSION: 1. Mild thyromegaly with bilateral cysts and complex cysts. None meets criteria for biopsy or dedicated imaging follow-up. The above is in keeping with the ACR TI-RADS recommendations - J Am Coll Radiol 2017;14:587-595. Electronically Signed   By: Lucrezia Europe M.D.   On: 08/01/2019 16:06       Assessment & Plan:   Problem List Items Addressed This Visit    Atrial flutter ([place])    Off metoprolol.  On eliquis.  Takes propranolol and diltiazem prn.  Continue f/u with Dr Rockey Situ.  Essential hypertension    Blood pressures reviewed - outside checks - averaging 116 - 140s/60-70s.  Continue amlodipine, losartan.  Follow pressures.  Follow metabolic panel.       Relevant Orders   TSH   CBC with  Differential/Platelet   Basic metabolic panel   Health care maintenance    Physical today 02/15/20.  Saw GI.  cologuard negative.  Mammogram 04/2018 - Birads I.  Schedule f/u mammogram.       Hypercholesterolemia    Previously tried crestor.  Desires not to restart.  Follow lipid panel.        Relevant Orders   Hepatic function panel   Lipid panel   Stress    On zoloft.  Handling stress.  Follow.        Tachycardia    With intermittent episodes as outlined.  Takes propranolol or diltiazem prn.  Off metoprolol.  States was stopped recently by Dr Rockey Situ.  Previously had issues with bradycardia.  She has concerns regarding stopping the metoprolol.  Request earlier f/u with Dr Rockey Situ.  Follow.         Other Visit Diagnoses    Encounter for screening mammogram for malignant neoplasm of breast    -  Primary   Relevant Orders   MM 3D SCREEN BREAST BILATERAL       Einar Pheasant, MD

## 2020-02-16 ENCOUNTER — Encounter: Payer: Self-pay | Admitting: Internal Medicine

## 2020-02-16 NOTE — Assessment & Plan Note (Signed)
Blood pressures reviewed - outside checks - averaging 116 - 140s/60-70s.  Continue amlodipine, losartan.  Follow pressures.  Follow metabolic panel.

## 2020-02-16 NOTE — Assessment & Plan Note (Signed)
On zoloft.  Handling stress.  Follow.

## 2020-02-16 NOTE — Assessment & Plan Note (Signed)
With intermittent episodes as outlined.  Takes propranolol or diltiazem prn.  Off metoprolol.  States was stopped recently by Dr Rockey Situ.  Previously had issues with bradycardia.  She has concerns regarding stopping the metoprolol.  Request earlier f/u with Dr Rockey Situ.  Follow.

## 2020-02-16 NOTE — Assessment & Plan Note (Signed)
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  Desires not to restart.  Follow lipid panel.

## 2020-02-16 NOTE — Assessment & Plan Note (Signed)
Off metoprolol.  On eliquis.  Takes propranolol and diltiazem prn.  Continue f/u with Dr Rockey Situ.

## 2020-02-28 ENCOUNTER — Other Ambulatory Visit (INDEPENDENT_AMBULATORY_CARE_PROVIDER_SITE_OTHER): Payer: Medicare Other

## 2020-02-28 ENCOUNTER — Other Ambulatory Visit: Payer: Self-pay

## 2020-02-28 DIAGNOSIS — I10 Essential (primary) hypertension: Secondary | ICD-10-CM | POA: Diagnosis not present

## 2020-02-28 DIAGNOSIS — E7800 Pure hypercholesterolemia, unspecified: Secondary | ICD-10-CM

## 2020-02-28 LAB — BASIC METABOLIC PANEL
BUN: 21 mg/dL (ref 6–23)
CO2: 29 mEq/L (ref 19–32)
Calcium: 9.5 mg/dL (ref 8.4–10.5)
Chloride: 105 mEq/L (ref 96–112)
Creatinine, Ser: 0.91 mg/dL (ref 0.40–1.20)
GFR: 58.9 mL/min — ABNORMAL LOW (ref >60.00)
Glucose, Bld: 95 mg/dL (ref 70–99)
Potassium: 4.3 mEq/L (ref 3.5–5.1)
Sodium: 139 mEq/L (ref 135–145)

## 2020-02-28 LAB — TSH: TSH: 2 u[IU]/mL (ref 0.35–4.50)

## 2020-02-28 LAB — CBC WITH DIFFERENTIAL/PLATELET
Basophils Absolute: 0.1 10*3/uL (ref 0.0–0.1)
Basophils Relative: 1.1 % (ref 0.0–3.0)
Eosinophils Absolute: 0.2 10*3/uL (ref 0.0–0.7)
Eosinophils Relative: 3.8 % (ref 0.0–5.0)
HCT: 40.9 % (ref 36.0–46.0)
Hemoglobin: 14.1 g/dL (ref 12.0–15.0)
Lymphocytes Relative: 23.2 % (ref 12.0–46.0)
Lymphs Abs: 1.1 10*3/uL (ref 0.7–4.0)
MCHC: 34.4 g/dL (ref 30.0–36.0)
MCV: 95.9 fl (ref 78.0–100.0)
Monocytes Absolute: 0.4 10*3/uL (ref 0.1–1.0)
Monocytes Relative: 9.2 % (ref 3.0–12.0)
Neutro Abs: 3 10*3/uL (ref 1.4–7.7)
Neutrophils Relative %: 62.7 % (ref 43.0–77.0)
Platelets: 260 10*3/uL (ref 150.0–400.0)
RBC: 4.27 Mil/uL (ref 3.87–5.11)
RDW: 13.9 % (ref 11.5–15.5)
WBC: 4.7 10*3/uL (ref 4.0–10.5)

## 2020-02-28 LAB — HEPATIC FUNCTION PANEL
ALT: 13 U/L (ref 0–35)
AST: 18 U/L (ref 0–37)
Albumin: 4.3 g/dL (ref 3.5–5.2)
Alkaline Phosphatase: 57 U/L (ref 39–117)
Bilirubin, Direct: 0.1 mg/dL (ref 0.0–0.3)
Total Bilirubin: 0.6 mg/dL (ref 0.2–1.2)
Total Protein: 6.8 g/dL (ref 6.0–8.3)

## 2020-02-28 LAB — LIPID PANEL
Cholesterol: 185 mg/dL (ref 0–200)
HDL: 68.2 mg/dL (ref >39.00)
LDL Cholesterol: 92 mg/dL (ref 0–99)
NonHDL: 117.27
Total CHOL/HDL Ratio: 3
Triglycerides: 128 mg/dL (ref 0.0–149.0)
VLDL: 25.6 mg/dL (ref 0.0–40.0)

## 2020-03-04 ENCOUNTER — Other Ambulatory Visit: Payer: Self-pay | Admitting: Internal Medicine

## 2020-04-18 ENCOUNTER — Other Ambulatory Visit: Payer: Self-pay | Admitting: Cardiovascular Disease

## 2020-04-18 ENCOUNTER — Other Ambulatory Visit: Payer: Self-pay | Admitting: Internal Medicine

## 2020-04-18 NOTE — Telephone Encounter (Signed)
Refill Request.  

## 2020-04-18 NOTE — Telephone Encounter (Signed)
Prescription refill request for Eliquis received.  Last office visit: [place], 01/21/2020 Scr: 0.91, 02/28/2020 Age: 86 y.o. Weight: 59 kg   Prescription refill sent.

## 2020-04-30 ENCOUNTER — Ambulatory Visit
Admission: RE | Admit: 2020-04-30 | Discharge: 2020-04-30 | Disposition: A | Discharge status: Home / Self Care | Payer: Medicare Other | Source: Ambulatory Visit | Attending: Internal Medicine | Admitting: Internal Medicine

## 2020-04-30 DIAGNOSIS — Z1231 Encounter for screening mammogram for malignant neoplasm of breast: Secondary | ICD-10-CM | POA: Diagnosis not present

## 2020-05-01 ENCOUNTER — Other Ambulatory Visit: Payer: Self-pay | Admitting: Internal Medicine

## 2020-05-01 DIAGNOSIS — N6320 Unspecified lump in the left breast, unspecified quadrant: Secondary | ICD-10-CM

## 2020-05-01 DIAGNOSIS — R928 Other abnormal and inconclusive findings on diagnostic imaging of breast: Secondary | ICD-10-CM

## 2020-05-01 DIAGNOSIS — N6489 Other specified disorders of breast: Secondary | ICD-10-CM

## 2020-05-09 ENCOUNTER — Ambulatory Visit
Admission: RE | Admit: 2020-05-09 | Discharge: 2020-05-09 | Disposition: A | Discharge status: Home / Self Care | Payer: Medicare Other | Source: Ambulatory Visit | Attending: Internal Medicine | Admitting: Internal Medicine

## 2020-05-09 ENCOUNTER — Other Ambulatory Visit: Payer: Self-pay

## 2020-05-09 DIAGNOSIS — N6489 Other specified disorders of breast: Secondary | ICD-10-CM

## 2020-05-09 DIAGNOSIS — N6320 Unspecified lump in the left breast, unspecified quadrant: Secondary | ICD-10-CM | POA: Diagnosis present

## 2020-05-09 DIAGNOSIS — R928 Other abnormal and inconclusive findings on diagnostic imaging of breast: Secondary | ICD-10-CM

## 2020-05-13 ENCOUNTER — Other Ambulatory Visit: Payer: Self-pay | Admitting: Internal Medicine

## 2020-05-13 DIAGNOSIS — R928 Other abnormal and inconclusive findings on diagnostic imaging of breast: Secondary | ICD-10-CM

## 2020-05-13 NOTE — Progress Notes (Signed)
Order placed for surgery referral.  

## 2020-05-20 ENCOUNTER — Other Ambulatory Visit: Payer: Self-pay | Admitting: General Surgery

## 2020-05-23 ENCOUNTER — Other Ambulatory Visit: Payer: Self-pay | Admitting: Internal Medicine

## 2020-05-23 ENCOUNTER — Other Ambulatory Visit: Payer: Self-pay | Admitting: Cardiovascular Disease

## 2020-05-26 ENCOUNTER — Other Ambulatory Visit: Payer: Self-pay | Admitting: General Surgery

## 2020-05-26 DIAGNOSIS — C50912 Malignant neoplasm of unspecified site of left female breast: Secondary | ICD-10-CM

## 2020-05-26 NOTE — Progress Notes (Addendum)
HPI  The following portions of the patient's history were reviewed and updated as appropriate.  This an established patient is here today for: office visit. Here today for follow up mammogram and left breast biopsy.  The patient recently had a screening mammogram with a new abnormality.  She has been on Eliquis for atrial fibrillation.  In conjunction with her cardiologist, this has been held for 3 days anticipating biopsy today.  The patient is unaware of any changes on her own breast exam.  Past medical history is notable that 2 paternal aunts had breast cancer.    Review of Systems  Constitutional: Negative for chills and fever.  Respiratory: Negative for cough.         Chief Complaint  Patient presents with  . Procedure    left breast biopsy     BP 166/64   Pulse 67   Temp 36.2 C (97.1 F)   Ht 170.2 cm ($RemoveB'5\' 7"'CtNSICnc$ )   Wt 59.4 kg (131 lb)   SpO2 97%   BMI 20.52 kg/m       Past Medical History:  Diagnosis Date  . Atrial flutter (CMS-HCC)   . GERD (gastroesophageal reflux disease)   . HTN (hypertension)   . Hyperlipidemia, mild   . Pneumonia due to infectious agent           Past Surgical History:  Procedure Laterality Date  . CATARACT EXTRACTION  2016  . COLONOSCOPY                OB History    Gravida  5   Para  2   Term      Preterm      AB  3   Living        SAB  3   TAB      Ectopic      Molar      Multiple      Live Births          Obstetric Comments  Age at first period  35 Age of first pregnancy 60         Social History     Socioeconomic History  . Marital status: Married    Spouse name: Not on file  . Number of children: Not on file  . Years of education: Not on file  . Highest education level: Not on file  Occupational History  . Not on file  Tobacco Use  . Smoking status: Never Smoker  . Smokeless tobacco: Never Used  Substance and Sexual Activity  . Alcohol use: No  .  Drug use: No  . Sexual activity: Defer  Other Topics Concern  . Not on file  Social History Narrative  . Not on file   Social Determinants of Health      Financial Resource Strain:   . Difficulty of Paying Living Expenses:   Food Insecurity:   . Worried About Charity fundraiser in the Last Year:   . Arboriculturist in the Last Year:   Transportation Needs:   . Film/video editor (Medical):   Marland Kitchen Lack of Transportation (Non-Medical):            Allergies  Allergen Reactions  . Ketoprofen Unknown  . Nabumetone Unknown  . Ticarcillin-Clavulanate Unknown    Current Medications        Current Outpatient Medications  Medication Sig Dispense Refill  . acetaminophen (TYLENOL EX STR ARTHRITIS PAIN ORAL) Take 1 tablet by  mouth 2 (two) times daily    . amLODIPine (NORVASC) 5 MG tablet Take 5 mg by mouth once daily    . ascorbic acid, vitamin C, (VITAMIN C) 250 MG tablet Take 250 mg by mouth once daily    . calcium carbonate-vitamin D3 600mg  (1,000mg ) -1,000 unit Tab Take by mouth once daily.    Marland Kitchen diltiazem (CARDIZEM) 30 MG tablet Take by mouth as directed    . famotidine (PEPCID) 20 MG tablet Take 1 tablet (20 mg total) by mouth 2 (two) times daily 60 tablet 11  . metoprolol succinate (TOPROL-XL) 25 MG XL tablet Take 25 mg by mouth once daily as needed    . multivitamin (MULTIVITAMIN) tablet Take by mouth once daily.    . sertraline (ZOLOFT) 50 MG tablet     . UNABLE TO FIND Take 1 tablet by mouth once daily Med Name:  ViaActiv    . apixaban (ELIQUIS) 2.5 mg tablet Take 2.5 mg by mouth every 12 (twelve) hours (Patient not taking: Reported on 05/20/2020  )    . propranoloL (INDERAL) 20 MG tablet Take by mouth as directed     No current facility-administered medications for this visit.           Family History  Problem Relation Age of Onset  . Heart disease Mother   . Lung cancer Father   . Colon cancer Neg Hx   . Colon polyps Neg Hx    . Rectal cancer Neg Hx               Objective:   Physical Exam Exam conducted with a chaperone present.  Constitutional:      Appearance: Normal appearance.  Cardiovascular:     Rate and Rhythm: Normal rate and regular rhythm.     Pulses: Normal pulses.     Heart sounds: Normal heart sounds.     Comments: The patient is in a regular rhythm today. Pulmonary:     Effort: Pulmonary effort is normal.     Breath sounds: Normal breath sounds.  Chest:     Breasts: Breasts are symmetrical.        Right: Normal.        Left: Normal.  Musculoskeletal:     Cervical back: Neck supple.  Lymphadenopathy:     Upper Body:     Right upper body: No supraclavicular or axillary adenopathy.     Left upper body: No supraclavicular or axillary adenopathy.  Skin:    General: Skin is warm and dry.  Neurological:     Mental Status: She is alert and oriented to person, place, and time.  Psychiatric:        Mood and Affect: Mood normal.        Behavior: Behavior normal.     Labs and Radiology:   Mammograms from April 07, 2018 through May 09, 2020 with associated ultrasound examinations were independently reviewed.  On the screening studies there were 2 questions on the May 01, 2020 exam 1 in the upper outer quadrant, 1 in the lower outer quadrant.  The former cleared with identification of a intramammary node on ultrasound the latter was the irregular 5 mm lesion suspicious for malignancy.  Biopsy procedure was reviewed with the patient and she was amenable to proceed.  Ultrasound examination of the left breast at the 5 o'clock position showed a irregular hypoechoic mass with a hyperechoic rim measuring 0.39 x 0.54 x 0.55 cm.  These skin was cleansed with ChloraPrep  and a total of 10 cc of 0.5% Xylocaine with 0.25% Marcaine with 1: 200,000 units of epinephrine was utilized and well-tolerated.  Making use of a lateral approach a small skin nick was made with an 11 blade followed  by the 14-gauge vacuum biopsy device.  Pre- and postfire images showed the area of concern being transfixed by the biopsy needle.  7 core samples were obtained.  A ribbon clip was placed after biopsy.  No bleeding was noted.  Skin defect was closed with benzoin and Steri-Strip followed by Telfa and Tegaderm dressing.  Ice pack provided.  Wound care reviewed.     Assessment:     New mammographic abnormality, possible malignancy.    Plan:     The patient will be contacted when pathology is available.  She will resume Eliquis tomorrow.    Entered by Karie Fetch, RN, acting as a scribe for Dr. Hervey Ard, MD.  The documentation recorded by the scribe accurately reflects the service I personally performed and the decisions made by me.   Robert Bellow, MD FACS   DIAGNOSIS:  A. BREAST, LEFT 4:00; BIOPSY:  - INVASIVE MAMMARY CARCINOMA, NO SPECIAL TYPE.  - SEE COMMENT.   Size of invasive carcinoma: 5 mm in this sample  Histologic grade of invasive carcinoma: Grade 2            Glandular/tubular differentiation score: 3            Nuclear pleomorphism score: 2            Mitotic rate score: 1            Total score: 6  Ductal carcinoma in situ: Not identified  Lymphovascular invasion: Not identified   ER/PR/HER2: Immunohistochemistry will be performed on block A1, with  reflex to [place] for HER2 2+. The results will be reported in an addendum.   BREAST BIOMARKER TESTS  Estrogen Receptor (ER) Status: POSITIVE            Percentage of cells with nuclear positivity:  Greater than 90%            Average intensity of staining: Strong   Progesterone Receptor (PgR) Status: POSITIVE            Percentage of cells with nuclear positivity: 1-10%            Average intensity of staining: Weak   HER2 (by immunohistochemistry): NEGATIVE (Score 0)   August 24, 201 phone follow up: Note   Called  to confirm decision re: mastectomy.   Slightly increased risk of bleeding in light of ongoing use of Eliquis.  As she has recently been in [place], low, but not zero risk of CVA off Eliquis for the procedure.    Confirmed desire for mastectomy.  Surgery scheduled for June 04, 2020.

## 2020-05-27 ENCOUNTER — Telehealth: Payer: Self-pay | Admitting: Internal Medicine

## 2020-05-27 NOTE — Telephone Encounter (Signed)
Pt is having surgery to remove mass in breast and would like some advice. No appts available this week. Please advise

## 2020-05-28 NOTE — Telephone Encounter (Signed)
Pt was nervous about having this done. Patient has appt with Dr Nicki Reaper next week but will be quarantined because she has to have a covid test done prior to her procedure. Switched to telephone call so she could talk to Dr Nicki Reaper.

## 2020-05-29 LAB — SURGICAL PATHOLOGY

## 2020-05-30 ENCOUNTER — Other Ambulatory Visit: Payer: Self-pay

## 2020-05-30 ENCOUNTER — Encounter
Admission: RE | Admit: 2020-05-30 | Discharge: 2020-05-30 | Disposition: A | Discharge status: Home / Self Care | Payer: Medicare Other | Source: Ambulatory Visit | Attending: General Surgery | Admitting: General Surgery

## 2020-05-30 HISTORY — DX: Gastro-esophageal reflux disease without esophagitis: K21.9

## 2020-05-30 HISTORY — DX: Unspecified atrial fibrillation: I48.91

## 2020-05-30 NOTE — Patient Instructions (Addendum)
Your procedure is scheduled on: 06/04/20 Report to [facility] AT 11:15 AS PREVIOUSLY INSTRUCTED. DAY SURGERY [phone].  Remember: Instructions that are not followed completely may result in serious medical risk, up to and including death, or upon the discretion of your surgeon and anesthesiologist your surgery may need to be rescheduled.     _X__ 1. Do not eat food after midnight the night before your procedure.                 No gum chewing or hard candies. You may drink clear liquids up to 2 hours                 before you are scheduled to arrive for your surgery- DO not drink clear                 liquids within 2 hours of the start of your surgery.                 Clear Liquids include:  water, apple juice without pulp, clear carbohydrate                 drink such as Clearfast or Gatorade, Black Coffee or Tea (Do not add                 anything to coffee or tea). Diabetics water only  __X__2.  On the morning of surgery brush your teeth with toothpaste and water, you                 may rinse your mouth with mouthwash if you wish.  Do not swallow any              toothpaste of mouthwash.     _X__ 3.  No Alcohol for 24 hours before or after surgery.   _X__ 4.  Do Not Smoke or use e-cigarettes For 24 Hours Prior to Your Surgery.                 Do not use any chewable tobacco products for at least 6 hours prior to                 surgery.  ____  5.  Bring all medications with you on the day of surgery if instructed.   __X__  6.  Notify your doctor if there is any change in your medical condition      (cold, fever, infections).     Do not wear jewelry, make-up, hairpins, clips or nail polish. Do not wear lotions, powders, or perfumes.  Do not shave 48 hours prior to surgery. Men may shave face and neck. Do not bring valuables to the hospital.    [facility] is not responsible for any belongings or valuables.  Contacts, dentures/partials or body  piercings may not be worn into surgery. Bring a case for your contacts, glasses or hearing aids, a denture cup will be supplied. Leave your suitcase in the car. After surgery it may be brought to your room. For patients admitted to the hospital, discharge time is determined by your treatment team.   Patients discharged the day of surgery will not be allowed to drive home.   Please read over the following fact sheets that you were given:   MRSA Information  __X__ Take these medicines the morning of surgery with A SIP OF WATER:    1. famotidine (PEPCID) 20 MG tablet  2. ONLY IF  NEEDED PROPRANOL OR DILTIAZEM FOR RAPID HEART RATE  3.   4.  5.  6.  ____ Fleet Enema (as directed)   __X__ Use CHG Soap/SAGE wipes as directed  ____ Use inhalers on the day of surgery  ____ Stop metformin/Janumet/Farxiga 2 days prior to surgery    ____ Take 1/2 of usual insulin dose the night before surgery. No insulin the morning          of surgery.   __X__ Stop Blood Thinners Coumadin/Plavix/Xarelto/Pleta/Pradaxa/Eliquis/Effient/Aspirin  on Sunday 06/01/20 AS INSTRUCTED BY DR BYRNETT  __X__ Stop Anti-inflammatories 7 days before surgery such as Advil, Ibuprofen, Motrin,  BC or Goodies Powder, Naprosyn, Naproxen, Aleve, Aspirin    __X__ Stop all herbal supplements, fish oil or vitamin E until after surgery.    ____ Bring C-Pap to the hospital.     How to Use Chlorhexidine for Bathing Chlorhexidine gluconate (CHG) is a germ-killing (antiseptic) solution that is used to clean the skin. It can get rid of the bacteria that normally live on the skin and can keep them away for about 24 hours. To clean your skin with CHG, you may be given:  A CHG solution to use in the shower or as part of a sponge bath.  A prepackaged cloth that contains CHG. Cleaning your skin with CHG may help lower the risk for infection:  While you are staying in the intensive care unit of the hospital.  If you have a vascular  access, such as a central line, to provide short-term or long-term access to your veins.  If you have a catheter to drain urine from your bladder.  If you are on a ventilator. A ventilator is a machine that helps you breathe by moving air in and out of your lungs.  After surgery. What are the risks? Risks of using CHG include:  A skin reaction.  Hearing loss, if CHG gets in your ears.  Eye injury, if CHG gets in your eyes and is not rinsed out.  The CHG product catching fire. Make sure that you avoid smoking and flames after applying CHG to your skin. Do not use CHG:  If you have a chlorhexidine allergy or have previously reacted to chlorhexidine.  On babies younger than 61 months of age. How to use CHG solution  Use CHG only as told by your health care provider, and follow the instructions on the label.  Use the full amount of CHG as directed. Usually, this is one bottle. During a shower Follow these steps when using CHG solution during a shower (unless your health care provider gives you different instructions): 1. Start the shower. 2. Use your normal soap and shampoo to wash your face and hair. 3. Turn off the shower or move out of the shower stream. 4. Pour the CHG onto a clean washcloth. Do not use any type of brush or rough-edged sponge. 5. Starting at your neck, lather your body down to your toes. Make sure you follow these instructions: ? If you will be having surgery, pay special attention to the part of your body where you will be having surgery. Scrub this area for at least 1 minute. ? Do not use CHG on your head or face. If the solution gets into your ears or eyes, rinse them well with water. ? Avoid your genital area. ? Avoid any areas of skin that have broken skin, cuts, or scrapes. ? Scrub your back and under your arms. Make sure to wash skin  folds. 6. Let the lather sit on your skin for 1-2 minutes or as long as told by your health care provider. 7. Thoroughly  rinse your entire body in the shower. Make sure that all body creases and crevices are rinsed well. 8. Dry off with a clean towel. Do not put any substances on your body afterward--such as powder, lotion, or perfume--unless you are told to do so by your health care provider. Only use lotions that are recommended by the manufacturer. 9. Put on clean clothes or pajamas. 10. If it is the night before your surgery, sleep in clean sheets. 11. REPEAT THE MORNING OF SURGERY   Surgical [facility] Surgical drains are used to remove extra fluid that normally builds up in a surgical wound after surgery. A surgical drain helps to heal a surgical wound. Different kinds of surgical drains include:  Active drains. These drains use suction to pull drainage away from the surgical wound. Drainage flows through a tube to a container outside of the body. With these drains, you need to keep the bulb or the drainage container flat (compressed) at all times, except while you empty it. Flattening the bulb or container creates suction.  Passive drains. These drains allow fluid to drain naturally, by gravity. Drainage flows through a tube to a bandage (dressing) or a container outside of the body. Passive drains do not need to be emptied. A drain is placed during surgery. Right after surgery, drainage is usually bright red and a little thicker than water. The drainage may gradually turn yellow or pink and become thinner. It is likely that your health care provider will remove the drain when the drainage stops or when the amount decreases to 1-2 Tbsp (15-30 mL) during a 24-hour period. Supplies needed:  Tape.  Germ-free cleaning solution (sterile saline).  Cotton swabs.  Split gauze drain sponge: 4 x 4 inches (10 x 10 cm).  Gauze square: 4 x 4 inches (10 x 10 cm). How to care for your surgical drain Care for your drain as told by your health care provider. This is important to help prevent infection. If your  drain is placed at your back, or any other hard-to-reach area, ask another person to assist you in performing the following tasks: General care  Keep the skin around the drain dry and covered with a dressing at all times.  Check your drain area every day for signs of infection. Check for: ? Redness, swelling, or pain. ? Pus or a bad smell. ? Cloudy drainage. ? Tenderness or pressure at the drain exit site. Changing the dressing Follow instructions from your health care provider about how to change your dressing. Change your dressing at least once a day. Change it more often if needed to keep the dressing dry. Make sure you: 1. Gather your supplies. 2. Wash your hands with soap and water before you change your dressing. If soap and water are not available, use hand sanitizer. 3. Remove the old dressing. Avoid using scissors to do that. 4. Wash your hands with soap and water again after removing the old dressing. 5. Use sterile saline to clean your skin around the drain. You may need to use a cotton swab to clean the skin. 6. Place the tube through the slit in a drain sponge. Place the drain sponge so that it covers your wound. 7. Place the gauze square or another drain sponge on top of the drain sponge that is on the wound. Make sure  the tube is between those layers. 8. Tape the dressing to your skin. 9. Tape the drainage tube to your skin 1-2 inches (2.5-5 cm) below the place where the tube enters your body. Taping keeps the tube from pulling on any stitches (sutures) that you have. 10. Wash your hands with soap and water. 11. Write down the color of your drainage and how often you change your dressing. How to empty your active drain  1. Make sure that you have a measuring cup that you can empty your drainage into. 2. Wash your hands with soap and water. If soap and water are not available, use hand sanitizer. 3. Loosen any pins or clips that hold the tube in place. 4. If your health care  provider tells you to strip the tube to prevent clots and tube blockages: ? Hold the tube at the skin with one hand. Use your other hand to pinch the tubing with your thumb and first finger. ? Gently move your fingers down the tube while squeezing very lightly. This clears any drainage, clots, or tissue from the tube. ? You may need to do this several times each day to keep the tube clear. Do not pull on the tube. 5. Open the bulb cap or the drain plug. Do not touch the inside of the cap or the bottom of the plug. 6. Turn the device upside down and gently squeeze. 7. Empty all of the drainage into the measuring cup. 8. Compress the bulb or the container and replace the cap or the plug. To compress the bulb or the container, squeeze it firmly in the middle while you close the cap or plug the container. 9. Write down the amount of drainage that you have in each 24-hour period. If you have less than 2 Tbsp (30 mL) of drainage during 24 hours, contact your health care provider. 10. Flush the drainage down the toilet. 11. Wash your hands with soap and water. Contact a health care provider if:  You have redness, swelling, or pain around your drain area.  You have pus or a bad smell coming from your drain area.  You have a fever or chills.  The skin around your drain is warm to the touch.  The amount of drainage that you have is increasing instead of decreasing.  You have drainage that is cloudy.  There is a sudden stop or a sudden decrease in the amount of drainage that you have.  Your drain tube falls out.  Your active drain does not stay compressed after you empty it. Summary  Surgical drains are used to remove extra fluid that normally builds up in a surgical wound after surgery.  Different kinds of surgical drains include active drains and passive drains. Active drains use suction to pull drainage away from the surgical wound, and passive drains allow fluid to drain naturally.  It  is important to care for your drain to prevent infection. If your drain is placed at your back, or any other hard-to-reach area, ask another person to assist you.  Contact your health care provider if you have redness, swelling, or pain around your drain area. This information is not intended to replace advice given to you by your health care provider. Make sure you discuss any questions you have with your health care provider. Document Revised: 10/25/2018 Document Reviewed: 10/25/2018 Elsevier Patient Education  2020 [employer].

## 2020-06-02 ENCOUNTER — Other Ambulatory Visit: Payer: Self-pay

## 2020-06-02 ENCOUNTER — Other Ambulatory Visit
Admission: RE | Admit: 2020-06-02 | Discharge: 2020-06-02 | Disposition: A | Discharge status: Home / Self Care | Payer: Medicare Other | Source: Ambulatory Visit | Attending: General Surgery | Admitting: General Surgery

## 2020-06-02 DIAGNOSIS — Z20822 Contact with and (suspected) exposure to covid-19: Secondary | ICD-10-CM | POA: Diagnosis not present

## 2020-06-02 DIAGNOSIS — Z01812 Encounter for preprocedural laboratory examination: Secondary | ICD-10-CM | POA: Insufficient documentation

## 2020-06-02 LAB — SARS CORONAVIRUS 2 (TAT 6-24 HRS): SARS Coronavirus 2: NEGATIVE

## 2020-06-03 ENCOUNTER — Ambulatory Visit (INDEPENDENT_AMBULATORY_CARE_PROVIDER_SITE_OTHER): Payer: Medicare Other | Admitting: Internal Medicine

## 2020-06-03 DIAGNOSIS — C50919 Malignant neoplasm of unspecified site of unspecified female breast: Secondary | ICD-10-CM

## 2020-06-03 DIAGNOSIS — E7800 Pure hypercholesterolemia, unspecified: Secondary | ICD-10-CM | POA: Diagnosis not present

## 2020-06-03 DIAGNOSIS — G479 Sleep disorder, unspecified: Secondary | ICD-10-CM

## 2020-06-03 DIAGNOSIS — F439 Reaction to severe stress, unspecified: Secondary | ICD-10-CM

## 2020-06-03 DIAGNOSIS — I10 Essential (primary) hypertension: Secondary | ICD-10-CM

## 2020-06-03 DIAGNOSIS — I483 Typical atrial flutter: Secondary | ICD-10-CM

## 2020-06-03 MED ORDER — SERTRALINE HCL 50 MG PO TABS
50.0000 mg | ORAL_TABLET | Freq: Every day | ORAL | 2 refills | Status: DC
Start: 2020-06-03 — End: 2020-11-24

## 2020-06-03 NOTE — Progress Notes (Signed)
Patient ID: Anita Benjamin, female   DOB: 1934-08-27, 86 y.o.   MRN: [id number]   Virtual Visit via telephone Note  This visit type was conducted due to national recommendations for restrictions regarding the COVID-19 pandemic (e.g. social distancing).  This format is felt to be most appropriate for this patient at this time.  All issues noted in this document were discussed and addressed.  No physical exam was performed (except for noted visual exam findings with Video Visits).   I connected with Anita Benjamin by telephone and verified that I am speaking with the correct person using two identifiers. Location patient: home Location provider: work Persons participating in the telephone visit: patient, provider  The limitations, risks, security and privacy concerns of performing an evaluation and management service by telephone and the availability of in person appointments have been discussed.  It has also been discussed with the patient that there may be a patient responsible charge related to this service. The patient expressed understanding and agreed to proceed.   Reason for visit: scheduled follow up  HPI: She recently had breast biopsy - pathology - small invasive mammary carcinoma.  Had a visit with Dr Bary Castilla to discuss treatment options - breast conservation with lumpectomy and XRT vs mastectomy.  She elected mastectomy.  She is anxious about the surgery.  Is wondering if she made the right decision about the surgery.  States one of the main reasons she chose the mastectomy is to avoid having XRT - states she is concerned about transportation, etc.  Had questions about the procedures.  Increased stress.  Affecting her sleep.  On zoloft.  She expressed that she would like to increase the dose.  Has been on higher dose previously and tolerated.  No chest pain.  Breathing stable.  Had episode this past week of increased heart rate.  Took her prn medication.  Resolved.  Overall she feels heart stable.   Blood pressure averaging 120-140/70s.  Off  eliquis for procedure.     ROS: See pertinent positives and negatives per HPI.  Past Medical History:  Diagnosis Date  . Anemia   . Atrial fibrillation ([place])   . Atrial flutter ([place])   . Fibrocystic breast disease   . GERD (gastroesophageal reflux disease)   . History of chicken pox   . Hypercholesterolemia   . Hypertension     Past Surgical History:  Procedure Laterality Date  . BREAST BIOPSY Right 2014   NEG  . BREAST LUMPECTOMY WITH SENTINEL LYMPH NODE BIOPSY Left 06/04/2020   Procedure: BREAST LUMPECTOMY WITH SENTINEL LYMPH NODE BX;  Surgeon: Robert Bellow, MD;  Location: [facility] ORS;  Service: General;  Laterality: Left;  . CATARACT EXTRACTION    . DILATION AND CURETTAGE OF UTERUS    . Laser repair of torn retina      Family History  Problem Relation Age of Onset  . Heart disease Mother 58       Heart attack  . Cancer Father 66       lung cancer  . Breast cancer Paternal Aunt        x3  . Colon cancer Neg Hx     SOCIAL HX: reviewed.    Current Outpatient Medications:  .  Acetaminophen (TYLENOL ARTHRITIS PAIN PO), Take 1 tablet by mouth 2 (two) times daily. , Disp: , Rfl:  .  acetaminophen (TYLENOL) 650 MG CR tablet, Take 650 mg by mouth every 8 (eight) hours as needed for pain., Disp: ,  Rfl:  .  amLODipine (NORVASC) 5 MG tablet, TAKE 1 TABLET BY MOUTH ONCE DAILY AT DINNER TIME (Patient taking differently: Take 5 mg by mouth daily with supper. TAKE 1 TABLET BY MOUTH ONCE DAILY AT DINNER TIME), Disp: 30 tablet, Rfl: 6 .  Ascorbic Acid (VITAMIN C) 250 MG CHEW, Chew 250 mg by mouth daily. , Disp: , Rfl:  .  calcium citrate-vitamin D 500-500 MG-UNIT chewable tablet, Chew 1 tablet by mouth daily., Disp: , Rfl:  .  Calcium-Vitamin D-Vitamin K (VIACTIV CALCIUM PLUS D PO), Take 1 Dose by mouth daily., Disp: , Rfl:  .  diltiazem (CARDIZEM) 30 MG tablet, Take 1 tablet (30 mg total) by mouth 3 (three) times daily as needed (As  needed for fast heart rates)., Disp: 90 tablet, Rfl: 3 .  famotidine (PEPCID) 20 MG tablet, Take 20 mg by mouth daily., Disp: , Rfl:  .  losartan (COZAAR) 100 MG tablet, TAKE 1 TABLET BY MOUTH ONCE DAILY (Patient taking differently: Take 100 mg by mouth daily. ), Disp: 30 tablet, Rfl: 2 .  Multiple Vitamin (MULTIVITAMIN) tablet, Take 1 tablet by mouth daily., Disp: , Rfl:  .  propranolol (INDERAL) 10 MG tablet, Take 10 mg by mouth 3 (three) times daily as needed (heart rate). Take 1st for rapid heart rate. If no response after 1 hour take diltiazem, Disp: , Rfl:  .  sertraline (ZOLOFT) 50 MG tablet, Take 1 tablet (50 mg total) by mouth at bedtime., Disp: 30 tablet, Rfl: 2 .  apixaban (ELIQUIS) 2.5 MG TABS tablet, Take 1 tablet (2.5 mg total) by mouth 2 (two) times daily., Disp: 1 tablet, Rfl: 0 .  HYDROcodone-acetaminophen (NORCO/VICODIN) 5-325 MG tablet, Take 1 tablet by mouth every 4 (four) hours as needed for moderate pain., Disp: 15 tablet, Rfl: 0  EXAM:  GENERAL: alert. Sounds to be in no acute distress.  Answering questions appropriately.   PSYCH/NEURO: pleasant and cooperative, no obvious depression or anxiety, speech and thought processing grossly intact  ASSESSMENT AND PLAN:  Discussed the following assessment and plan:  Stress Increased stress as outlined.  Discussed with her regarding surgery.  Discussed with Dr Bary Castilla.  Plans to d/w pt.  On zoloft.  Increase to 50mg  q day.  Follow.   Hypercholesterolemia Previously tried crestor.  Has desired not to restart.  Follow lipid panel.   Essential hypertension Blood pressure as outlined.  Continue amlodipine and losartan.  Follow pressures.  Follow metabolic panel.   Difficulty sleeping On zoloft.  Increase to 50mg  q day.  Follow.    Atrial flutter eliquis on hold for now for surgery.  Takes propranolol and diltiazem prn.  Overall she feels is stable.  Continue f/u with Dr Rockey Situ.   Breast cancer [facility]) Just recently  diagnosed.  Planning for surgery as outlined.  Continue f/u with Dr Bary Castilla.     Orders Placed This Encounter  Procedures  . Hepatic function panel    Standing Status:   Future    Standing Expiration Date:   06/09/2021  . Lipid panel    Standing Status:   Future    Standing Expiration Date:   06/09/2021  . Basic metabolic panel    Standing Status:   Future    Standing Expiration Date:   06/09/2021    Meds ordered this encounter  Medications  . sertraline (ZOLOFT) 50 MG tablet    Sig: Take 1 tablet (50 mg total) by mouth at bedtime.    Dispense:  30 tablet  Refill:  2    Hold until pt request refill.     I discussed the assessment and treatment plan with the patient. The patient was provided an opportunity to ask questions and all were answered. The patient agreed with the plan and demonstrated an understanding of the instructions.   The patient was advised to call back or seek an in-person evaluation if the symptoms worsen or if the condition fails to improve as anticipated.  I provided 25 minutes of non-face-to-face time during this encounter.   Einar Pheasant, MD

## 2020-06-03 NOTE — H&P (Signed)
The patient was contacted regarding plans for surgery. On further consideration, and discussing the possible omission of radiation therapy should she choose breast conservation, she desires breast conservation surgery.   In light of pathology and size, recognizing a moderate risk in local recurrence if radiation is not completed, I think this is a reasonable course.

## 2020-06-04 ENCOUNTER — Encounter
Admission: RE | Disposition: A | Discharge status: Home / Self Care | Payer: Self-pay | Source: Home / Self Care | Attending: General Surgery

## 2020-06-04 ENCOUNTER — Other Ambulatory Visit: Payer: Self-pay

## 2020-06-04 ENCOUNTER — Ambulatory Visit: Payer: Medicare Other | Admitting: Certified Registered Nurse Anesthetist

## 2020-06-04 ENCOUNTER — Ambulatory Visit
Admission: RE | Admit: 2020-06-04 | Discharge: 2020-06-04 | Disposition: A | Discharge status: Home / Self Care | Payer: Medicare Other | Source: Ambulatory Visit | Attending: General Surgery | Admitting: General Surgery

## 2020-06-04 ENCOUNTER — Ambulatory Visit
Admission: RE | Admit: 2020-06-04 | Discharge: 2020-06-04 | Disposition: A | Discharge status: Home / Self Care | Payer: Medicare Other | Source: Home / Self Care | Attending: General Surgery | Admitting: General Surgery

## 2020-06-04 ENCOUNTER — Ambulatory Visit
Admission: RE | Admit: 2020-06-04 | Discharge: 2020-06-04 | Disposition: A | Discharge status: Home / Self Care | Payer: Medicare Other | Attending: General Surgery | Admitting: General Surgery

## 2020-06-04 ENCOUNTER — Encounter: Payer: Self-pay | Admitting: General Surgery

## 2020-06-04 DIAGNOSIS — Z7901 Long term (current) use of anticoagulants: Secondary | ICD-10-CM | POA: Insufficient documentation

## 2020-06-04 DIAGNOSIS — K219 Gastro-esophageal reflux disease without esophagitis: Secondary | ICD-10-CM | POA: Diagnosis not present

## 2020-06-04 DIAGNOSIS — I10 Essential (primary) hypertension: Secondary | ICD-10-CM | POA: Insufficient documentation

## 2020-06-04 DIAGNOSIS — Z79899 Other long term (current) drug therapy: Secondary | ICD-10-CM | POA: Insufficient documentation

## 2020-06-04 DIAGNOSIS — Z170 Estrogen receptor positive status [ER+]: Secondary | ICD-10-CM | POA: Insufficient documentation

## 2020-06-04 DIAGNOSIS — I4891 Unspecified atrial fibrillation: Secondary | ICD-10-CM | POA: Diagnosis not present

## 2020-06-04 DIAGNOSIS — C50912 Malignant neoplasm of unspecified site of left female breast: Secondary | ICD-10-CM

## 2020-06-04 DIAGNOSIS — C50512 Malignant neoplasm of lower-outer quadrant of left female breast: Secondary | ICD-10-CM | POA: Insufficient documentation

## 2020-06-04 DIAGNOSIS — Z419 Encounter for procedure for purposes other than remedying health state, unspecified: Secondary | ICD-10-CM

## 2020-06-04 HISTORY — PX: BREAST LUMPECTOMY WITH SENTINEL LYMPH NODE BIOPSY: SHX5597

## 2020-06-04 SURGERY — BREAST LUMPECTOMY WITH SENTINEL LYMPH NODE BX
Anesthesia: General | Laterality: Left

## 2020-06-04 MED ORDER — APIXABAN 2.5 MG PO TABS: 2.5000 mg | ORAL_TABLET | Freq: Two times a day (BID) | ORAL | 0 refills | Status: DC

## 2020-06-04 MED ORDER — METOPROLOL TARTRATE 5 MG/5ML IV SOLN
INTRAVENOUS | Status: AC
  Administered 2020-06-04: 5 mg via INTRAVENOUS
  Filled 2020-06-04: qty 5

## 2020-06-04 MED ORDER — PHENYLEPHRINE HCL (PRESSORS) 10 MG/ML IV SOLN
INTRAVENOUS | Status: DC | PRN
  Administered 2020-06-04: 100 ug via INTRAVENOUS
  Administered 2020-06-04: 150 ug via INTRAVENOUS
  Administered 2020-06-04 (×2): 100 ug via INTRAVENOUS

## 2020-06-04 MED ORDER — CEFAZOLIN SODIUM-DEXTROSE 2-4 GM/100ML-% IV SOLN
INTRAVENOUS | Status: AC
  Filled 2020-06-04: qty 100

## 2020-06-04 MED ORDER — FENTANYL CITRATE (PF) 100 MCG/2ML IJ SOLN
INTRAMUSCULAR | Status: AC
  Filled 2020-06-04: qty 2

## 2020-06-04 MED ORDER — BUPIVACAINE-EPINEPHRINE (PF) 0.5% -1:200000 IJ SOLN
INTRAMUSCULAR | Status: DC | PRN
  Administered 2020-06-04: 10 mL
  Administered 2020-06-04: 20 mL

## 2020-06-04 MED ORDER — EPHEDRINE SULFATE 50 MG/ML IJ SOLN
INTRAMUSCULAR | Status: DC | PRN
  Administered 2020-06-04: 10 mg via INTRAVENOUS
  Administered 2020-06-04: 5 mg via INTRAVENOUS

## 2020-06-04 MED ORDER — PROPOFOL 10 MG/ML IV BOLUS
INTRAVENOUS | Status: DC | PRN
  Administered 2020-06-04: 130 mg via INTRAVENOUS
  Administered 2020-06-04: 30 mg via INTRAVENOUS

## 2020-06-04 MED ORDER — LACTATED RINGERS IV SOLN: INTRAVENOUS | Status: DC

## 2020-06-04 MED ORDER — TECHNETIUM TC 99M SULFUR COLLOID FILTERED
0.6890 | Freq: Once | INTRAVENOUS | Status: AC | PRN
  Administered 2020-06-04: 0.689 via INTRADERMAL

## 2020-06-04 MED ORDER — FENTANYL CITRATE (PF) 100 MCG/2ML IJ SOLN: 25.0000 ug | INTRAMUSCULAR | Status: DC | PRN

## 2020-06-04 MED ORDER — PROPRANOLOL HCL 10 MG PO TABS
10.0000 mg | ORAL_TABLET | Freq: Once | ORAL | Status: AC
  Administered 2020-06-04: 10 mg via ORAL
  Filled 2020-06-04: qty 1

## 2020-06-04 MED ORDER — DEXAMETHASONE SODIUM PHOSPHATE 10 MG/ML IJ SOLN
INTRAMUSCULAR | Status: DC | PRN
  Administered 2020-06-04: 5 mg via INTRAVENOUS

## 2020-06-04 MED ORDER — GLYCOPYRROLATE 0.2 MG/ML IJ SOLN
INTRAMUSCULAR | Status: DC | PRN
  Administered 2020-06-04: .2 mg via INTRAVENOUS

## 2020-06-04 MED ORDER — PROPOFOL 10 MG/ML IV BOLUS
INTRAVENOUS | Status: AC
  Filled 2020-06-04: qty 20

## 2020-06-04 MED ORDER — ONDANSETRON HCL 4 MG/2ML IJ SOLN
INTRAMUSCULAR | Status: DC | PRN
  Administered 2020-06-04: 4 mg via INTRAVENOUS

## 2020-06-04 MED ORDER — METOPROLOL TARTRATE 5 MG/5ML IV SOLN: 5.0000 mg | Freq: Once | INTRAVENOUS | Status: AC

## 2020-06-04 MED ORDER — HYDROCODONE-ACETAMINOPHEN 5-325 MG PO TABS: 1.0000 | ORAL_TABLET | ORAL | 0 refills | Status: DC | PRN

## 2020-06-04 MED ORDER — LIDOCAINE HCL (CARDIAC) PF 100 MG/5ML IV SOSY
PREFILLED_SYRINGE | INTRAVENOUS | Status: DC | PRN
  Administered 2020-06-04: 60 mg via INTRAVENOUS

## 2020-06-04 MED ORDER — ACETAMINOPHEN 10 MG/ML IV SOLN
INTRAVENOUS | Status: DC | PRN
  Administered 2020-06-04: 1000 mg via INTRAVENOUS

## 2020-06-04 MED ORDER — ORAL CARE MOUTH RINSE: 15.0000 mL | Freq: Once | OROMUCOSAL | Status: AC

## 2020-06-04 MED ORDER — CHLORHEXIDINE GLUCONATE 0.12 % MT SOLN
15.0000 mL | Freq: Once | OROMUCOSAL | Status: AC
  Administered 2020-06-04: 15 mL via OROMUCOSAL

## 2020-06-04 MED ORDER — ACETAMINOPHEN 10 MG/ML IV SOLN
INTRAVENOUS | Status: AC
  Filled 2020-06-04: qty 100

## 2020-06-04 MED ORDER — CHLORHEXIDINE GLUCONATE 0.12 % MT SOLN
OROMUCOSAL | Status: AC
  Filled 2020-06-04: qty 15

## 2020-06-04 MED ORDER — FENTANYL CITRATE (PF) 100 MCG/2ML IJ SOLN
INTRAMUSCULAR | Status: DC | PRN
Start: 2020-06-04 — End: 2020-06-04
  Administered 2020-06-04 (×4): 25 ug via INTRAVENOUS

## 2020-06-04 MED ORDER — CEFAZOLIN SODIUM-DEXTROSE 2-4 GM/100ML-% IV SOLN
2.0000 g | INTRAVENOUS | Status: AC
  Administered 2020-06-04: 2 g via INTRAVENOUS

## 2020-06-04 MED ORDER — ONDANSETRON HCL 4 MG/2ML IJ SOLN: 4.0000 mg | Freq: Once | INTRAMUSCULAR | Status: DC | PRN

## 2020-06-04 MED ORDER — METHYLENE BLUE 0.5 % INJ SOLN
INTRAVENOUS | Status: DC | PRN
  Administered 2020-06-04: 3 mL

## 2020-06-04 SURGICAL SUPPLY — 66 items
APL PRP STRL LF DISP 70% ISPRP (MISCELLANEOUS)
APL SKNCLS STERI-STRIP NONHPOA (GAUZE/BANDAGES/DRESSINGS) ×1
BENZOIN TINCTURE PRP APPL 2/3 (GAUZE/BANDAGES/DRESSINGS) ×1 IMPLANT
BINDER BREAST LRG (GAUZE/BANDAGES/DRESSINGS) ×1 IMPLANT
BINDER BREAST MEDIUM (GAUZE/BANDAGES/DRESSINGS) IMPLANT
BINDER BREAST XLRG (GAUZE/BANDAGES/DRESSINGS) IMPLANT
BINDER BREAST XXLRG (GAUZE/BANDAGES/DRESSINGS) IMPLANT
BLADE BOVIE TIP EXT 4 (BLADE) ×1 IMPLANT
BLADE SURG 15 STRL SS SAFETY (BLADE) ×4 IMPLANT
BULB RESERV EVAC DRAIN JP 100C (MISCELLANEOUS) IMPLANT
CANISTER SUCT 1200ML W/VALVE (MISCELLANEOUS) ×2 IMPLANT
CHLORAPREP W/TINT 26 (MISCELLANEOUS) ×1 IMPLANT
CNTNR SPEC 2.5X3XGRAD LEK (MISCELLANEOUS)
CONT SPEC 4OZ STER OR WHT (MISCELLANEOUS)
CONT SPEC 4OZ STRL OR WHT (MISCELLANEOUS)
CONTAINER SPEC 2.5X3XGRAD LEK (MISCELLANEOUS) IMPLANT
COVER PROBE FLX POLY STRL (MISCELLANEOUS) ×2 IMPLANT
COVER WAND RF STERILE (DRAPES) ×2 IMPLANT
DEVICE DUBIN SPECIMEN MAMMOGRA (MISCELLANEOUS) ×4 IMPLANT
DRAIN CHANNEL JP 15F RND 16 (MISCELLANEOUS) IMPLANT
DRAPE LAPAROTOMY TRNSV 106X77 (MISCELLANEOUS) ×2 IMPLANT
DRSG GAUZE FLUFF 36X18 (GAUZE/BANDAGES/DRESSINGS) ×3 IMPLANT
DRSG TELFA 3X8 NADH (GAUZE/BANDAGES/DRESSINGS) ×2 IMPLANT
DRSG TELFA 4X3 1S NADH ST (GAUZE/BANDAGES/DRESSINGS) ×1 IMPLANT
DURAPREP 26ML APPLICATOR (WOUND CARE) ×1 IMPLANT
ELECT CAUTERY BLADE TIP 2.5 (TIP) ×2
ELECT REM PT RETURN 9FT ADLT (ELECTROSURGICAL) ×2
ELECTRODE CAUTERY BLDE TIP 2.5 (TIP) ×1 IMPLANT
ELECTRODE REM PT RTRN 9FT ADLT (ELECTROSURGICAL) ×1 IMPLANT
GAUZE SPONGE 4X4 12PLY STRL (GAUZE/BANDAGES/DRESSINGS) ×2 IMPLANT
GLOVE BIO SURGEON STRL SZ7.5 (GLOVE) ×2 IMPLANT
GLOVE INDICATOR 8.0 STRL GRN (GLOVE) ×2 IMPLANT
GOWN STRL REUS W/ TWL LRG LVL3 (GOWN DISPOSABLE) ×2 IMPLANT
GOWN STRL REUS W/TWL LRG LVL3 (GOWN DISPOSABLE) ×4
KIT TURNOVER KIT A (KITS) ×2 IMPLANT
LABEL OR SOLS (LABEL) ×2 IMPLANT
MARGIN MAP 10MM (MISCELLANEOUS) ×3 IMPLANT
NDL HYPO 25X1 1.5 SAFETY (NEEDLE) ×2 IMPLANT
NEEDLE HYPO 22GX1.5 SAFETY (NEEDLE) ×2 IMPLANT
NEEDLE HYPO 25X1 1.5 SAFETY (NEEDLE) ×4 IMPLANT
PACK BASIN MINOR (MISCELLANEOUS) ×2 IMPLANT
PAD DRESSING TELFA 3X8 NADH (GAUZE/BANDAGES/DRESSINGS) ×1 IMPLANT
RETRACTOR RING XSMALL (MISCELLANEOUS) IMPLANT
RTRCTR WOUND ALEXIS 13CM XS SH (MISCELLANEOUS) ×2
SHEARS FOC LG CVD HARMONIC 17C (MISCELLANEOUS) IMPLANT
SHEARS HARMONIC 9CM CVD (BLADE) IMPLANT
SLEVE PROBE SENORX GAMMA FIND (MISCELLANEOUS) ×1 IMPLANT
STRIP CLOSURE SKIN 1/2X4 (GAUZE/BANDAGES/DRESSINGS) ×2 IMPLANT
SUT ETHILON 3-0 FS-10 30 BLK (SUTURE) ×2
SUT SILK 2 0 (SUTURE) ×2
SUT SILK 2-0 18XBRD TIE 12 (SUTURE) ×1 IMPLANT
SUT VIC AB 2-0 CT1 (SUTURE) ×1 IMPLANT
SUT VIC AB 2-0 CT1 27 (SUTURE) ×6
SUT VIC AB 2-0 CT1 36 (SUTURE) ×1 IMPLANT
SUT VIC AB 2-0 CT1 TAPERPNT 27 (SUTURE) ×3 IMPLANT
SUT VIC AB 3-0 SH 27 (SUTURE) ×4
SUT VIC AB 3-0 SH 27X BRD (SUTURE) ×2 IMPLANT
SUT VIC AB 4-0 FS2 27 (SUTURE) ×5 IMPLANT
SUT VICRYL+ 3-0 144IN (SUTURE) ×2 IMPLANT
SUTURE EHLN 3-0 FS-10 30 BLK (SUTURE) ×1 IMPLANT
SWABSTK COMLB BENZOIN TINCTURE (MISCELLANEOUS) ×2 IMPLANT
SYR 10ML LL (SYRINGE) ×2 IMPLANT
SYR BULB IRRIG 60ML STRL (SYRINGE) ×2 IMPLANT
TAPE STRIPS DRAPE STRL (GAUZE/BANDAGES/DRESSINGS) ×1 IMPLANT
TAPE TRANSPORE STRL 2 31045 (GAUZE/BANDAGES/DRESSINGS) ×2 IMPLANT
WATER STERILE IRR 1000ML POUR (IV SOLUTION) ×2 IMPLANT

## 2020-06-04 NOTE — Anesthesia Procedure Notes (Signed)
Procedure Name: LMA Insertion Date/Time: 06/04/2020 2:01 PM Performed by: Lowry Bowl, CRNA Pre-anesthesia Checklist: Patient identified, Emergency Drugs available, Suction available and Patient being monitored Patient Re-evaluated:Patient Re-evaluated prior to induction Oxygen Delivery Method: Circle system utilized Preoxygenation: Pre-oxygenation with 100% oxygen Induction Type: IV induction Ventilation: Mask ventilation without difficulty LMA: LMA inserted LMA Size: 3.0 Number of attempts: 1 Placement Confirmation: positive ETCO2 and breath sounds checked- equal and bilateral Tube secured with: Tape Dental Injury: Teeth and Oropharynx as per pre-operative assessment

## 2020-06-04 NOTE — Discharge Instructions (Signed)

## 2020-06-04 NOTE — Op Note (Signed)
Preoperative diagnosis: Left breast cancer, lower outer quadrant.  Postoperative diagnosis: Same.  Operative procedure: Wide local excision of the lower outer quadrant of the left breast, left axillary sentinel node biopsy.  Operating surgeon: Hervey Ard, MD.  Anesthesia: General by LMA, Marcaine 0.5% with 1: 200,000 units of epinephrine, 30 cc.  Estimated blood loss: 5 cc.  Clinical note: This 86 year old woman was noted with an abnormal mammogram and ultrasound identified a 5 mm area in the lower outer quadrant of the breast.  She underwent office-based vacuum biopsy with findings of invasive mammary carcinoma.  While initially considering mastectomy she has decided to go to breast conservation.  She underwent injection with technetium sulfur colloid the morning of the procedure and tolerated this well.  She received Ancef as an antibiotic prophylaxis.  SCD stockings for DVT prevention.  Operative note: The patient under adequate general anesthesia the periareolar tissue was cleansed with alcohol and a total of 3 cc of 0.5% methylene blue was injected in subareolar plexus.  The skin was then prepped with DuraPrep and draped.  Ultrasound was used to identify the suspected location of the mass in the lower outer quadrant of the left breast at approximately 5 o'clock position.  A radial incision was made and carried down through skin and subcutaneous tissue after injection of local anesthetic.  A 3 x 4 x 5 cm block of tissue was removed, orientated and specimen radiograph failed to confirm the previously placed ribbon clip.  Palpation showed no discernible abnormality.  Adjacent tissue was taken from the lateral aspect and again radiograph showed no clip material.  It was then elected to remove an ellipse of skin encompassing the 4 to 6 o'clock position down to the pectoralis fascia.  Once again specimen radiograph failed to show the previously placed clip.  The insertion site of the vacuum biopsy  needle had been clearly identified in the lower outer quadrant just lateral to the original incision.  At this point it was elected to abort additional tissue resection as it was not possible to identify any clear abnormality with ultrasound or on clinical exam.  The breast parenchyma was elevated off the underlying pectoralis fascia with electrocautery and the fascia was approximated with interrupted 2-0 Vicryl figure-of-eight sutures.  The breast parenchyma was approximated in similar fashion.  Flaps were elevated laterally to allow reasonable approximation of the skin with a running 3-0 Vicryl subcuticular suture.  Attention was turned to the axilla.  The node seeker device was used to identify area of increased counts of about 100.  Local anesthesia was infiltrated and a transverse incision made.  The skin was incised sharply and the remaining dissection electrocautery.  A blue lymphatic was identified where it split and each tract to a separate 6-7 mm blue hot lymph node.  These were removed and axillary counts fell to 0.  Hemostasis was with electrocautery.  The axillary envelope was closed with a 2-0 Vicryl figure-of-eight suture.  The adipose tissue was treated in a similar fashion.  The skin was closed with a running 4-0 Vicryl subcuticular suture.  Benzoin, Steri-Strips and Telfa dressing followed by fluff gauze and a compressive wrap was applied.  The patient tolerated procedure well and was taken to recovery in stable condition.

## 2020-06-04 NOTE — Anesthesia Postprocedure Evaluation (Signed)
Anesthesia Post Note  Patient: Anita Benjamin  Procedure(s) Performed: BREAST LUMPECTOMY WITH SENTINEL LYMPH NODE BX (Left )  Patient location during evaluation: PACU Anesthesia Type: General Level of consciousness: awake and alert Pain management: pain level controlled Vital Signs Assessment: post-procedure vital signs reviewed and stable Respiratory status: spontaneous breathing and respiratory function stable Cardiovascular status: stable Anesthetic complications: no   No complications documented.   Last Vitals:  Vitals:   06/04/20 1631 06/04/20 1640  BP:  123/80  Pulse: (!) 119 (!) 124  Resp: 12   Temp:    SpO2: 98%     Last Pain:  Vitals:   06/04/20 1615  TempSrc:   PainSc: 0-No pain                 Javoni Lucken K

## 2020-06-04 NOTE — H&P (Signed)
Anita Benjamin [id number] Aug 30, 1934     HPI:  86 y/o woman with a small, 5 mm invasive mammary carcinoma.  Originally, she was planning on mastectomy, so as to avoid post operative radiation.  After further discussion, she has decided with breast conservation with later consideration of RT. She has held her Eliquis as requested.   Medications Prior to Admission  Medication Sig Dispense Refill Last Dose  . acetaminophen (TYLENOL) 650 MG CR tablet Take 650 mg by mouth every 8 (eight) hours as needed for pain.   06/03/2020 at Unknown time  . amLODipine (NORVASC) 5 MG tablet TAKE 1 TABLET BY MOUTH ONCE DAILY AT DINNER TIME (Patient taking differently: Take 5 mg by mouth daily with supper. TAKE 1 TABLET BY MOUTH ONCE DAILY AT DINNER TIME) 30 tablet 6 06/03/2020 at Unknown time  . Ascorbic Acid (VITAMIN C) 250 MG CHEW Chew 250 mg by mouth daily.    06/03/2020 at Unknown time  . calcium citrate-vitamin D 500-500 MG-UNIT chewable tablet Chew 1 tablet by mouth daily.   06/03/2020 at Unknown time  . Calcium-Vitamin D-Vitamin K (VIACTIV CALCIUM PLUS D PO) Take 1 Dose by mouth daily.   06/03/2020 at Unknown time  . diltiazem (CARDIZEM) 30 MG tablet Take 1 tablet (30 mg total) by mouth 3 (three) times daily as needed (As needed for fast heart rates). 90 tablet 3 06/03/2020 at Unknown time  . ELIQUIS 2.5 MG TABS tablet TAKE 1 TABLET BY MOUTH TWICE DAILY (Patient taking differently: Take 2.5 mg by mouth 2 (two) times daily. ) 180 tablet 1 06/01/2020  . famotidine (PEPCID) 20 MG tablet Take 20 mg by mouth daily.   06/04/2020 at Unknown time  . losartan (COZAAR) 100 MG tablet TAKE 1 TABLET BY MOUTH ONCE DAILY (Patient taking differently: Take 100 mg by mouth daily. ) 30 tablet 2 06/03/2020 at Unknown time  . Multiple Vitamin (MULTIVITAMIN) tablet Take 1 tablet by mouth daily.   06/03/2020 at Unknown time  . propranolol (INDERAL) 10 MG tablet Take 10 mg by mouth 3 (three) times daily as needed (heart rate). Take 1st for rapid  heart rate. If no response after 1 hour take diltiazem   06/03/2020 at Unknown time  . sertraline (ZOLOFT) 50 MG tablet Take 1 tablet (50 mg total) by mouth at bedtime. 30 tablet 2 06/03/2020 at Unknown time  . Acetaminophen (TYLENOL ARTHRITIS PAIN PO) Take 1 tablet by mouth 2 (two) times daily.    Not Taking at Unknown time   Allergies  Allergen Reactions  . Oruvail [Ketoprofen]   . Relafen [Nabumetone]   . Timentin [Ticarcillin-Pot Clavulanate]    Past Medical History:  Diagnosis Date  . Anemia   . Atrial fibrillation ([place])   . Atrial flutter ([place])   . Fibrocystic breast disease   . GERD (gastroesophageal reflux disease)   . History of chicken pox   . Hypercholesterolemia   . Hypertension    Past Surgical History:  Procedure Laterality Date  . BREAST BIOPSY Right 2014   NEG  . CATARACT EXTRACTION    . DILATION AND CURETTAGE OF UTERUS    . Laser repair of torn retina     Social History   Socioeconomic History  . Marital status: Widowed    Spouse name: Not on file  . Number of children: 2  . Years of education: Not on file  . Highest education level: Not on file  Occupational History  . Not on file  Tobacco Use  .  Smoking status: Never Smoker  . Smokeless tobacco: Never Used  Vaping Use  . Vaping Use: Never used  Substance and Sexual Activity  . Alcohol use: No    Alcohol/week: 0.0 standard drinks  . Drug use: No  . Sexual activity: Not on file  Other Topics Concern  . Not on file  Social History Narrative  . Not on file   Social Determinants of Health   Financial Resource Strain:   . Difficulty of Paying Living Expenses: Not on file  Food Insecurity:   . Worried About Charity fundraiser in the Last Year: Not on file  . Ran Out of Food in the Last Year: Not on file  Transportation Needs:   . Lack of Transportation (Medical): Not on file  . Lack of Transportation (Non-Medical): Not on file  Physical Activity:   . Days of Exercise per Week: Not on file   . Minutes of Exercise per Session: Not on file  Stress:   . Feeling of Stress : Not on file  Social Connections:   . Frequency of Communication with Friends and Family: Not on file  . Frequency of Social Gatherings with Friends and Family: Not on file  . Attends Religious Services: Not on file  . Active Member of Clubs or Organizations: Not on file  . Attends Archivist Meetings: Not on file  . Marital Status: Not on file  Intimate Partner Violence:   . Fear of Current or Ex-Partner: Not on file  . Emotionally Abused: Not on file  . Physically Abused: Not on file  . Sexually Abused: Not on file   Social History   Social History Narrative  . Not on file     ROS: Negative.     PE: HEENT: Negative. Lungs: Clear. Cardio: RR.  Assessment/Plan:  Proceed with planned left wide excision and SLN biopsy.   Forest Gleason [facility] 06/04/2020

## 2020-06-04 NOTE — Anesthesia Preprocedure Evaluation (Addendum)
Anesthesia Evaluation  Patient identified by MRN, date of birth, ID band Patient awake    Reviewed: Allergy & Precautions, NPO status , Patient's Chart, lab work & pertinent test results  History of Anesthesia Complications Negative for: history of anesthetic complications  Airway Mallampati: II       Dental   Pulmonary neg sleep apnea, neg COPD, Not current smoker,           Cardiovascular hypertension, Pt. on medications and Pt. on home beta blockers (-) Past MI and (-) CHF + dysrhythmias Supra Ventricular Tachycardia (-) Valvular Problems/Murmurs     Neuro/Psych neg Seizures    GI/Hepatic Neg liver ROS, GERD  Medicated and Controlled,  Endo/Other  neg diabetes  Renal/GU negative Renal ROS     Musculoskeletal   Abdominal   Peds  Hematology  (+) anemia ,   Anesthesia Other Findings   Reproductive/Obstetrics                            Anesthesia Physical Anesthesia Plan  ASA: III  Anesthesia Plan: General   Post-op Pain Management:    Induction: Intravenous  PONV Risk Score and Plan: 3 and Ondansetron, Dexamethasone and Midazolam  Airway Management Planned: LMA  Additional Equipment:   Intra-op Plan:   Post-operative Plan:   Informed Consent: I have reviewed the patients History and Physical, chart, labs and discussed the procedure including the risks, benefits and alternatives for the proposed anesthesia with the patient or authorized representative who has indicated his/her understanding and acceptance.       Plan Discussed with:   Anesthesia Plan Comments:         Anesthesia Quick Evaluation

## 2020-06-04 NOTE — Transfer of Care (Signed)
Immediate Anesthesia Transfer of Care Note  Patient: Anita Benjamin  Procedure(s) Performed: BREAST LUMPECTOMY WITH SENTINEL LYMPH NODE BX (Left )  Patient Location: PACU  Anesthesia Type:General  Level of Consciousness: awake, drowsy and patient cooperative  Airway & Oxygen Therapy: Patient Spontanous Breathing  Post-op Assessment: Report given to RN and Post -op Vital signs reviewed and stable  Post vital signs: Reviewed and stable  Last Vitals:  Vitals Value Taken Time  BP 149/76 06/04/20 1554  Temp 36.4 C 06/04/20 1554  Pulse 72 06/04/20 1556  Resp 13 06/04/20 1556  SpO2 100 % 06/04/20 1556  Vitals shown include unvalidated device data.  Last Pain:  Vitals:   06/04/20 1240  TempSrc: Temporal  PainSc: 0-No pain         Complications: No complications documented.

## 2020-06-06 ENCOUNTER — Other Ambulatory Visit: Payer: Self-pay | Admitting: Anatomic Pathology & Clinical Pathology

## 2020-06-06 LAB — SURGICAL PATHOLOGY

## 2020-06-09 ENCOUNTER — Encounter: Payer: Self-pay | Admitting: Internal Medicine

## 2020-06-09 DIAGNOSIS — C50919 Malignant neoplasm of unspecified site of unspecified female breast: Secondary | ICD-10-CM | POA: Insufficient documentation

## 2020-06-09 NOTE — Assessment & Plan Note (Signed)
Just recently diagnosed.  Planning for surgery as outlined.  Continue f/u with Dr Bary Castilla.

## 2020-06-09 NOTE — Assessment & Plan Note (Signed)
On zoloft.  Increase to 50mg  q day.  Follow.

## 2020-06-09 NOTE — Assessment & Plan Note (Signed)
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  Has desired not to restart.  Follow lipid panel.

## 2020-06-09 NOTE — Assessment & Plan Note (Signed)
Increased stress as outlined.  Discussed with her regarding surgery.  Discussed with Dr Bary Castilla.  Plans to d/w pt.  On zoloft.  Increase to 50mg  q day.  Follow.

## 2020-06-09 NOTE — Assessment & Plan Note (Signed)
Blood pressure as outlined. Continue amlodipine and losartan.  Follow pressures.  Follow metabolic panel.  

## 2020-06-09 NOTE — Assessment & Plan Note (Signed)
eliquis on hold for now for surgery.  Takes propranolol and diltiazem prn.  Overall she feels is stable.  Continue f/u with Dr Rockey Situ.

## 2020-06-27 ENCOUNTER — Other Ambulatory Visit: Payer: Self-pay

## 2020-06-27 ENCOUNTER — Emergency Department: Payer: Medicare Other

## 2020-06-27 ENCOUNTER — Emergency Department
Admission: EM | Admit: 2020-06-27 | Discharge: 2020-06-27 | Disposition: A | Discharge status: Home / Self Care | Payer: Medicare Other | Attending: Emergency Medicine | Admitting: Emergency Medicine

## 2020-06-27 DIAGNOSIS — Z853 Personal history of malignant neoplasm of breast: Secondary | ICD-10-CM | POA: Insufficient documentation

## 2020-06-27 DIAGNOSIS — I4892 Unspecified atrial flutter: Secondary | ICD-10-CM | POA: Insufficient documentation

## 2020-06-27 DIAGNOSIS — I10 Essential (primary) hypertension: Secondary | ICD-10-CM | POA: Insufficient documentation

## 2020-06-27 DIAGNOSIS — Z86010 Personal history of colonic polyps: Secondary | ICD-10-CM | POA: Diagnosis not present

## 2020-06-27 DIAGNOSIS — R55 Syncope and collapse: Secondary | ICD-10-CM | POA: Diagnosis present

## 2020-06-27 DIAGNOSIS — Z79899 Other long term (current) drug therapy: Secondary | ICD-10-CM | POA: Insufficient documentation

## 2020-06-27 DIAGNOSIS — R197 Diarrhea, unspecified: Secondary | ICD-10-CM | POA: Insufficient documentation

## 2020-06-27 DIAGNOSIS — Z20822 Contact with and (suspected) exposure to covid-19: Secondary | ICD-10-CM | POA: Diagnosis not present

## 2020-06-27 DIAGNOSIS — Z7901 Long term (current) use of anticoagulants: Secondary | ICD-10-CM | POA: Insufficient documentation

## 2020-06-27 LAB — RESPIRATORY PANEL BY RT PCR (FLU A&B, COVID)
Influenza A by PCR: NEGATIVE
Influenza B by PCR: NEGATIVE
SARS Coronavirus 2 by RT PCR: NEGATIVE

## 2020-06-27 LAB — URINALYSIS, COMPLETE (UACMP) WITH MICROSCOPIC
Bilirubin Urine: NEGATIVE
Glucose, UA: NEGATIVE mg/dL
Hgb urine dipstick: NEGATIVE
Ketones, ur: NEGATIVE mg/dL
Leukocytes,Ua: NEGATIVE
Nitrite: NEGATIVE
Protein, ur: NEGATIVE mg/dL
Specific Gravity, Urine: 1.014 (ref 1.005–1.030)
pH: 6 (ref 5.0–8.0)

## 2020-06-27 LAB — COMPREHENSIVE METABOLIC PANEL
ALT: 22 U/L (ref 0–44)
AST: 34 U/L (ref 15–41)
Albumin: 3.7 g/dL (ref 3.5–5.0)
Alkaline Phosphatase: 42 U/L (ref 38–126)
Anion gap: 9 (ref 5–15)
BUN: 21 mg/dL (ref 8–23)
CO2: 25 mmol/L (ref 22–32)
Calcium: 8.9 mg/dL (ref 8.9–10.3)
Chloride: 105 mmol/L (ref 98–111)
Creatinine, Ser: 0.86 mg/dL (ref 0.44–1.00)
GFR calc Af Amer: >60 mL/min (ref >60)
GFR calc non Af Amer: >60 mL/min (ref >60)
Glucose, Bld: 147 mg/dL — ABNORMAL HIGH (ref 70–99)
Potassium: 4.9 mmol/L (ref 3.5–5.1)
Sodium: 139 mmol/L (ref 135–145)
Total Bilirubin: 1 mg/dL (ref 0.3–1.2)
Total Protein: 6 g/dL — ABNORMAL LOW (ref 6.5–8.1)

## 2020-06-27 LAB — CBC WITH DIFFERENTIAL/PLATELET
Abs Immature Granulocytes: 0.02 10*3/uL (ref 0.00–0.07)
Basophils Absolute: 0 10*3/uL (ref 0.0–0.1)
Basophils Relative: 1 %
Eosinophils Absolute: 0.1 10*3/uL (ref 0.0–0.5)
Eosinophils Relative: 3 %
HCT: 35.7 % — ABNORMAL LOW (ref 36.0–46.0)
Hemoglobin: 11.9 g/dL — ABNORMAL LOW (ref 12.0–15.0)
Immature Granulocytes: 0 %
Lymphocytes Relative: 11 %
Lymphs Abs: 0.6 10*3/uL — ABNORMAL LOW (ref 0.7–4.0)
MCH: 32.9 pg (ref 26.0–34.0)
MCHC: 33.3 g/dL (ref 30.0–36.0)
MCV: 98.6 fL (ref 80.0–100.0)
Monocytes Absolute: 0.2 10*3/uL (ref 0.1–1.0)
Monocytes Relative: 4 %
Neutro Abs: 4.5 10*3/uL (ref 1.7–7.7)
Neutrophils Relative %: 81 %
Platelets: 277 10*3/uL (ref 150–400)
RBC: 3.62 MIL/uL — ABNORMAL LOW (ref 3.87–5.11)
RDW: 13.2 % (ref 11.5–15.5)
WBC: 5.5 10*3/uL (ref 4.0–10.5)
nRBC: 0 % (ref 0.0–0.2)

## 2020-06-27 LAB — MAGNESIUM: Magnesium: 2.1 mg/dL (ref 1.7–2.4)

## 2020-06-27 LAB — TROPONIN I (HIGH SENSITIVITY)
Troponin I (High Sensitivity): 4 ng/L (ref <18)
Troponin I (High Sensitivity): 4 ng/L (ref <18)

## 2020-06-27 LAB — BRAIN NATRIURETIC PEPTIDE: B Natriuretic Peptide: 199.3 pg/mL — ABNORMAL HIGH (ref 0.0–100.0)

## 2020-06-27 NOTE — ED Triage Notes (Addendum)
BIB [facility] from home due to syncope. Pt had syncopal episodes with EMS as well as being hypotensive. Pt had 500cc NS. 190 CBG. A&0X 4. EDP at bedside.   Pt reports she had high HR this AM and took her medications to slow it down. Denies injury with syncope, states "I never went all the way out"

## 2020-06-27 NOTE — ED Notes (Signed)
Pt to CT

## 2020-06-27 NOTE — ED Notes (Signed)
Pt alert and oriented X 4, stable for discharge. RR even and unlabored, color WNL. Discussed discharge instructions and follow-up as directed. Discharge medications discussed if provided. Pt had opportunity to ask questions if necessary and RN to provide patient/family eduction.  

## 2020-06-27 NOTE — ED Provider Notes (Signed)
[facility] Emergency Department Provider Note  ____________________________________________   First MD Initiated Contact with Patient 06/27/20 1535     (approximate)  I have reviewed the triage vital signs and the nursing notes.   HISTORY  Chief Complaint Loss of Consciousness and Hypotension   HPI Anita Benjamin is a 86 y.o. female with a past medical history of breast cancer status post left lumpectomy, A. fib on Eliquis, GERD, HTN, HDL, and anemia who presents via EMS from home after a witnessed syncopal episode.  Patient remembers feeling dizzy but does not remember losing consciousness although apparently she did lose consciousness per family.  Per EMS who spoke with family patient did not fall but on arrival she had another witnessed episode when she attempted to stand where she passed out again.  Patient states she had one episode of diarrhea and thinks she did not drink enough today.  She denies any other recent similar episodes.  She states she is currently asymptomatic and denies any fevers, chills, cough, chest pain, shortness of breath, headache, earache, sore throat, vertigo, burning with urination, blood in her stool, blood in urine, rash, extremity pain, other acute complaints.  Denies EtOH use, illicit drug use, tobacco abuse.         Past Medical History:  Diagnosis Date  . Anemia   . Atrial fibrillation ([place])   . Atrial flutter ([place])   . Fibrocystic breast disease   . GERD (gastroesophageal reflux disease)   . History of chicken pox   . Hypercholesterolemia   . Hypertension     Patient Active Problem List   Diagnosis Date Noted  . Breast cancer ([place]) 06/09/2020  . Diarrhea 04/11/2019  . Hair loss 06/10/2017  . Fatigue 02/19/2017  . Chest pain 02/12/2017  . Encounter for anticoagulation discussion and counseling 10/05/2016  . Cough 10/01/2016  . Gas 10/27/2015  . Dysuria 09/17/2015  . Left hip pain 07/21/2015  . Neck pain  06/09/2015  . Syncope 03/25/2015  . Left wrist pain 03/25/2015  . SOB (shortness of breath) 12/01/2014  . Abdominal discomfort 12/01/2014  . Atrial flutter ([place]) 11/15/2014  . Stress 11/08/2014  . Difficulty sleeping 11/08/2014  . Health care maintenance 11/08/2014  . Adjustment disorder 10/14/2014  . Tachycardia 07/17/2014  . Hyperkalemia 07/17/2014  . History of colonic polyps 03/09/2014  . Decreased sense of taste 03/09/2014  . Abnormal mammogram, unspecified 07/31/2013  . Abnormal mammogram 01/20/2013  . Essential hypertension 10/29/2012  . Hypercholesterolemia 10/29/2012  . Leukopenia 10/29/2012    Past Surgical History:  Procedure Laterality Date  . BREAST BIOPSY Right 2014   NEG  . BREAST LUMPECTOMY WITH SENTINEL LYMPH NODE BIOPSY Left 06/04/2020   Procedure: BREAST LUMPECTOMY WITH SENTINEL LYMPH NODE BX;  Surgeon: Robert Bellow, MD;  Location: [facility] ORS;  Service: General;  Laterality: Left;  . CATARACT EXTRACTION    . DILATION AND CURETTAGE OF UTERUS    . Laser repair of torn retina      Prior to Admission medications   Medication Sig Start Date End Date Taking? Authorizing Provider  Acetaminophen (TYLENOL ARTHRITIS PAIN PO) Take 1 tablet by mouth 2 (two) times daily.     [provider]  acetaminophen (TYLENOL) 650 MG CR tablet Take 650 mg by mouth every 8 (eight) hours as needed for pain.    [provider]  amLODipine (NORVASC) 5 MG tablet TAKE 1 TABLET BY MOUTH ONCE DAILY AT [facility] TIME Patient taking differently: Take 5  mg by mouth daily with supper. TAKE 1 TABLET BY MOUTH ONCE DAILY AT [facility] TIME 05/23/20   Minna Merritts, MD  apixaban (ELIQUIS) 2.5 MG TABS tablet Take 1 tablet (2.5 mg total) by mouth 2 (two) times daily. 06/07/20   Robert Bellow, MD  Ascorbic Acid (VITAMIN C) 250 MG CHEW Chew 250 mg by mouth daily.     [provider]  calcium citrate-vitamin D 500-500 MG-UNIT chewable tablet Chew 1 tablet by mouth daily.     [provider]  Calcium-Vitamin D-Vitamin K (VIACTIV CALCIUM PLUS D PO) Take 1 Dose by mouth daily.    [provider]  diltiazem (CARDIZEM) 30 MG tablet Take 1 tablet (30 mg total) by mouth 3 (three) times daily as needed (As needed for fast heart rates). 01/21/20   Minna Merritts, MD  famotidine (PEPCID) 20 MG tablet Take 20 mg by mouth daily.    [provider]  HYDROcodone-acetaminophen (NORCO/VICODIN) 5-325 MG tablet Take 1 tablet by mouth every 4 (four) hours as needed for moderate pain. 06/04/20 06/04/21  Robert Bellow, MD  losartan (COZAAR) 100 MG tablet TAKE 1 TABLET BY MOUTH ONCE DAILY Patient taking differently: Take 100 mg by mouth daily.  05/23/20   Einar Pheasant, MD  Multiple Vitamin (MULTIVITAMIN) tablet Take 1 tablet by mouth daily.    [provider]  propranolol (INDERAL) 10 MG tablet Take 10 mg by mouth 3 (three) times daily as needed (heart rate). Take 1st for rapid heart rate. If no response after 1 hour take diltiazem    [provider]  sertraline (ZOLOFT) 50 MG tablet Take 1 tablet (50 mg total) by mouth at bedtime. 06/03/20   Einar Pheasant, MD    Allergies Oruvail [ketoprofen], Relafen [nabumetone], and Timentin [ticarcillin-pot clavulanate]  Family History  Problem Relation Age of Onset  . Heart disease Mother 45       Heart attack  . Cancer Father 59       lung cancer  . Breast cancer Paternal Aunt        x3  . Colon cancer Neg Hx     Social History Social History   Tobacco Use  . Smoking status: Never Smoker  . Smokeless tobacco: Never Used  Vaping Use  . Vaping Use: Never used  Substance Use Topics  . Alcohol use: No    Alcohol/week: 0.0 standard drinks  . Drug use: No    Review of Systems  Review of Systems  Constitutional: Negative for chills and fever.  HENT: Negative for sore throat.   Eyes: Negative for pain.  Respiratory: Negative for cough and stridor.   Cardiovascular: Negative for  chest pain.  Gastrointestinal: Positive for diarrhea. Negative for vomiting.  Genitourinary: Negative for dysuria.  Musculoskeletal: Negative for myalgias.  Skin: Negative for rash.  Neurological: Positive for dizziness and loss of consciousness. Negative for seizures and headaches.  Psychiatric/Behavioral: Negative for suicidal ideas.  All other systems reviewed and are negative.     ____________________________________________   PHYSICAL EXAM:  VITAL SIGNS: ED Triage Vitals [06/27/20 1532]  Enc Vitals Group     BP      Pulse      Resp      Temp      Temp src      SpO2      Weight      Height      Head Circumference      Peak Flow  Pain Score 0     Pain Loc      Pain Edu?      Excl. in [place]?    Vitals:   06/27/20 1740 06/27/20 1758  BP: 139/65 (!) 141/67  Pulse: 68 65  Resp: 18 18  Temp:    SpO2: 98% 98%   Physical Exam Vitals and nursing note reviewed.  Constitutional:      General: She is not in acute distress.    Appearance: She is well-developed.  HENT:     Head: Normocephalic and atraumatic.     Right Ear: External ear normal.     Left Ear: External ear normal.     Nose: Nose normal.     Mouth/Throat:     Mouth: Mucous membranes are moist.  Eyes:     Conjunctiva/sclera: Conjunctivae normal.  Cardiovascular:     Rate and Rhythm: Regular rhythm. Bradycardia present.     Heart sounds: No murmur heard.   Pulmonary:     Effort: Pulmonary effort is normal. No respiratory distress.     Breath sounds: Normal breath sounds.  Abdominal:     Palpations: Abdomen is soft.     Tenderness: There is no abdominal tenderness.  Musculoskeletal:     Cervical back: Neck supple.  Skin:    General: Skin is warm and dry.  Neurological:     General: No focal deficit present.     Mental Status: She is alert and oriented to person, place, and time.     Comments: Cranial nerves II through XII grossly intact.  Patient has full and symmetric strength of all  extremities.  No pronator drift.  No finger dysmetria.      ____________________________________________   LABS (all labs ordered are listed, but only abnormal results are displayed)  Labs Reviewed  CBC WITH DIFFERENTIAL/PLATELET - Abnormal; Notable for the following components:      Result Value   RBC 3.62 (*)    Hemoglobin 11.9 (*)    HCT 35.7 (*)    Lymphs Abs 0.6 (*)    All other components within normal limits  COMPREHENSIVE METABOLIC PANEL - Abnormal; Notable for the following components:   Glucose, Bld 147 (*)    Total Protein 6.0 (*)    All other components within normal limits  BRAIN NATRIURETIC PEPTIDE - Abnormal; Notable for the following components:   B Natriuretic Peptide 199.3 (*)    All other components within normal limits  URINALYSIS, COMPLETE (UACMP) WITH MICROSCOPIC - Abnormal; Notable for the following components:   Color, Urine YELLOW (*)    APPearance CLEAR (*)    Bacteria, UA RARE (*)    All other components within normal limits  RESPIRATORY PANEL BY RT PCR (FLU A&B, COVID)  MAGNESIUM  TROPONIN I (HIGH SENSITIVITY)  TROPONIN I (HIGH SENSITIVITY)   ____________________________________________  EKG  Sinus rhythm with a ventricular rate of 70, normal axis, unremarkable intervals, T wave inversion in lead III without other clear evidence of acute ischemia. ____________________________________________  RADIOLOGY  ED MD interpretation: CT head shows no evidence of acute intracranial bleed.  Chest x-ray shows no evidence of limb overload, focal consolidation, effusion, pneumothorax, or other acute process.  Official radiology report(s): DG Chest 2 View  Result Date: 06/27/2020 CLINICAL DATA:  Syncope. Additional history provided: Multiple syncopal episodes today, history of atrial fibrillation, hypertension. EXAM: CHEST - 2 VIEW COMPARISON:  Chest radiographs 10/01/2016. FINDINGS: Heart size within normal limits. Redemonstrated biapical  pleuroparenchymal scarring (greater on the  left). No appreciable airspace consolidation or pulmonary edema. No evidence of pleural effusion or pneumothorax. No acute bony abnormality identified. Mild thoracic dextrocurvature. IMPRESSION: No evidence of acute cardiopulmonary abnormality. Redemonstrated biapical pleuroparenchymal scarring. Electronically Signed   By: Kellie Simmering DO   On: 06/27/2020 16:59   CT Head Wo Contrast  Result Date: 06/27/2020 CLINICAL DATA:  Headache, intracranial hemorrhage suspected. Additional history provided: Syncopal episode, hypotension. EXAM: CT HEAD WITHOUT CONTRAST TECHNIQUE: Contiguous axial images were obtained from the base of the skull through the vertex without intravenous contrast. COMPARISON:  No pertinent prior exams are available for comparison. FINDINGS: Brain: Moderate generalized cerebral atrophy. Mild ill-defined hypoattenuation within the cerebral white matter is nonspecific, but consistent with chronic small vessel ischemic disease. There is no acute intracranial hemorrhage. No demarcated cortical infarct. No extra-axial fluid collection. No evidence of intracranial mass. No midline shift. Vascular: No hyperdense vessel. Skull: Normal. Negative for fracture or focal lesion. Sinuses/Orbits: Visualized orbits show no acute finding. Mild ethmoid sinus mucosal thickening. No significant mastoid effusion. IMPRESSION: No CT evidence of acute intracranial abnormality. Moderate generalized cerebral atrophy with mild chronic small vessel ischemic disease. Mild ethmoid sinus mucosal thickening. Electronically Signed   By: Kellie Simmering DO   On: 06/27/2020 16:09    ____________________________________________   PROCEDURES  Procedure(s) performed (including Critical Care):  .1-3 Lead EKG Interpretation Performed by: Lucrezia Starch, MD Authorized by: Lucrezia Starch, MD     Interpretation: normal     ECG rate assessment: normal     Rhythm: sinus rhythm      Ectopy: none     Conduction: normal       ____________________________________________   INITIAL IMPRESSION / ASSESSMENT AND PLAN / ED COURSE        Patient presents with [place] to history exam after 2 syncopal episodes earlier today.  Patient is afebrile and initially with a heart rate of 47 but otherwise is stable vital signs on room air.  His heart rate on recheck was noted to be in the 70s.  Exam as above.  Overall presentation is concerning for possible dehydration versus symptomatic bradycardia.  While there is an isolated T wave inversion that appears new on patient's EKG she is to nonelevated troponins obtained over 2 hours and no history of chest pain or shortness of breath and low suspicion for ACS at this time.  Low suspicion for PE as patient is anticoagulated and states she is compliant with her Eliquis.  Patient is not orthostatic.  She has no significant Metabolic derangements noted on her CMP lower sugar slightly elevated 147.  CBC shows a hemoglobin of 11.9 compared to 14.1 obtained 4 months ago but otherwise no significant derangements.  Patient denies any melanotic or bloody stools have low suspicion for acute symptomatic anemia related to her presentation today.  While BNP is slightly elevated patient does not appear overloaded on exam or chest x-ray and will suspicion for heart failure contributing to her presentation.  I did discuss with patient at length my concerns for possible symptomatic bradycardia related to her beta-blocker medications as well as an abnormal finding on EKG that we are more than happy to admit her for observation.  Patient is adamant that she wished to be discharged home and will contact her cardiologist first thing Monday morning.  She does take propranolol as needed for palpitations and I discussed taking only half a dose until she sees her cardiologist that she may need to currently reduce her  dose of beta-blocker.  Given stable vital signs with  otherwise reassuring work-up and patient asymptomatic with this is reasonable and patient was discharged stable condition.  Strict and precautions advised and discussed.  ____________________________________________   FINAL CLINICAL IMPRESSION(S) / ED DIAGNOSES  Final diagnoses:  Syncope and collapse    Medications - No data to display   ED Discharge Orders    None       Note:  This document was prepared using Dragon voice recognition software and may include unintentional dictation errors.   Lucrezia Starch, MD 06/27/20 [phone]

## 2020-06-30 ENCOUNTER — Telehealth: Payer: Self-pay | Admitting: Cardiovascular Disease

## 2020-06-30 NOTE — Telephone Encounter (Signed)
Spoke with patient and not able to pull up the EKG done in ED but I could review one at last visit here. She states the EMT looked younger than her grandson and he kept coming back telling her multiple things. Stating that it could be small heart attack, then something else, and then came back again. She just wanted to see if we can compare EKG's to see if there are any significant changes that she should be concerned about. Reviewed that I would have Dr. Rockey Situ review and would be in touch with her. She was appreciative for the call back with no further questions at this time.

## 2020-06-30 NOTE — Telephone Encounter (Signed)
Patient recently at ED and was told she has EKG changes.  Please review and advise patient .     Declined ov prior to discussing.

## 2020-07-01 ENCOUNTER — Other Ambulatory Visit: Payer: Self-pay | Admitting: General Surgery

## 2020-07-01 DIAGNOSIS — C50912 Malignant neoplasm of unspecified site of left female breast: Secondary | ICD-10-CM

## 2020-07-02 NOTE — Telephone Encounter (Signed)
I have reviewed the EKG, with normal cardiac enzymes I am less worried I am concerned about lead placement Would recommend a repeat EKG before any further testing  She does have a history of syncope, near syncope problem vasovagal etiology in the setting of GI distress, diarrhea This episode is very similar to August 2020 When she has diarrhea or abdominal cramping, she will need to be very careful, sit, hydrate

## 2020-07-02 NOTE — Telephone Encounter (Signed)
Spoke with patient and reviewed that he did look at [facility], and recommendations. She was not worried or concerned and states that she has upcoming appointment with her primary care provider. She states that they typically do EKG for visit and let her know that should be fine but if she should have any concerns before then to please call back and let us know. She verbalized understanding of our conversation, agreement with plan, and had no further questions at this time.

## 2020-07-07 ENCOUNTER — Encounter: Payer: Self-pay | Admitting: Radiation Oncology

## 2020-07-07 ENCOUNTER — Other Ambulatory Visit: Payer: Self-pay

## 2020-07-07 ENCOUNTER — Ambulatory Visit
Admission: RE | Admit: 2020-07-07 | Discharge: 2020-07-07 | Disposition: A | Discharge status: Home / Self Care | Payer: Medicare Other | Source: Ambulatory Visit | Attending: Radiation Oncology | Admitting: Radiation Oncology

## 2020-07-07 VITALS — BP 144/68 | HR 55 | Temp 96.7°F | Resp 18 | Wt 130.5 lb

## 2020-07-07 DIAGNOSIS — Z7901 Long term (current) use of anticoagulants: Secondary | ICD-10-CM | POA: Insufficient documentation

## 2020-07-07 DIAGNOSIS — Z79899 Other long term (current) drug therapy: Secondary | ICD-10-CM | POA: Diagnosis not present

## 2020-07-07 DIAGNOSIS — Z170 Estrogen receptor positive status [ER+]: Secondary | ICD-10-CM | POA: Insufficient documentation

## 2020-07-07 DIAGNOSIS — D649 Anemia, unspecified: Secondary | ICD-10-CM | POA: Insufficient documentation

## 2020-07-07 DIAGNOSIS — E7800 Pure hypercholesterolemia, unspecified: Secondary | ICD-10-CM | POA: Diagnosis not present

## 2020-07-07 DIAGNOSIS — C50412 Malignant neoplasm of upper-outer quadrant of left female breast: Secondary | ICD-10-CM | POA: Diagnosis not present

## 2020-07-07 DIAGNOSIS — Z803 Family history of malignant neoplasm of breast: Secondary | ICD-10-CM | POA: Insufficient documentation

## 2020-07-07 DIAGNOSIS — I10 Essential (primary) hypertension: Secondary | ICD-10-CM | POA: Diagnosis not present

## 2020-07-07 DIAGNOSIS — I4891 Unspecified atrial fibrillation: Secondary | ICD-10-CM | POA: Diagnosis not present

## 2020-07-07 DIAGNOSIS — K219 Gastro-esophageal reflux disease without esophagitis: Secondary | ICD-10-CM | POA: Insufficient documentation

## 2020-07-07 NOTE — Consult Note (Signed)
NEW PATIENT EVALUATION  Name: Anita Benjamin  MRN: [id number]  Date:   07/07/2020     DOB: May 23, 1934   This 86 y.o. 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  REFERRING PHYSICIAN: Einar Pheasant, MD  CHIEF COMPLAINT:  Chief Complaint  Patient presents with  . Breast Cancer    Initial consultation    DIAGNOSIS: The encounter diagnosis was Malignant neoplasm of upper-outer quadrant of left breast in female, estrogen receptor positive ([place]).   PREVIOUS INVESTIGATIONS:  Mammogram and ultrasound reviewed Pathology report reviewed Clinical notes reviewed  HPI: Patient is a 86 year old female who presented with an abnormal mammogram of her left breast.  Back in July she had mammogram showing a possible mass and asymmetry in the left breast.  Further work-up including diagnostic tomograms showed a 5 x 4 mm irregular hypoechoic lesion in the left breast at the 4 o'clock position.  Biopsy was positive for ER positive invasive mammary carcinoma.  She went on to have a wide local excision for a 6 mm grade 2 invasive mammary carcinoma with margins clear at 0.2 mm.  2 sentinel lymph nodes were negative for metastatic disease.  Tumor was ER/PR positive HER-2/neu not overexpressed.  She is healing well the present time.  She is now referred to radiation oncology for consideration of treatment.  Her breast is still somewhat tender although healing extremely well.  PLANNED TREATMENT REGIMEN: Left hypofractionated whole breast radiation  PAST MEDICAL HISTORY:  has a past medical history of Anemia, Atrial fibrillation ([place]), Atrial flutter ([place]), Fibrocystic breast disease, GERD (gastroesophageal reflux disease), History of chicken pox, Hypercholesterolemia, and Hypertension.    PAST SURGICAL HISTORY:  Past Surgical History:  Procedure  Laterality Date  . BREAST BIOPSY Right 2014   NEG  . BREAST LUMPECTOMY WITH SENTINEL LYMPH NODE BIOPSY Left 06/04/2020   Procedure: BREAST LUMPECTOMY WITH SENTINEL LYMPH NODE BX;  Surgeon: Robert Bellow, MD;  Location: [facility] ORS;  Service: General;  Laterality: Left;  . CATARACT EXTRACTION    . DILATION AND CURETTAGE OF UTERUS    . Laser repair of torn retina      FAMILY HISTORY: family history includes Breast cancer in her paternal aunt; Cancer (age of onset: 12) in her father; Heart disease (age of onset: 12) in her mother.  SOCIAL HISTORY:  reports that she has never smoked. She has never used smokeless tobacco. She reports that she does not drink alcohol and does not use drugs.  ALLERGIES: Oruvail [ketoprofen], Relafen [nabumetone], and Timentin [ticarcillin-pot clavulanate]  MEDICATIONS:  Current Outpatient Medications  Medication Sig Dispense Refill  . Acetaminophen (TYLENOL ARTHRITIS PAIN PO) Take 1 tablet by mouth 2 (two) times daily.     Marland Kitchen acetaminophen (TYLENOL) 650 MG CR tablet Take 650 mg by mouth every 8 (eight) hours as needed for pain.    Marland Kitchen amLODipine (NORVASC) 5 MG tablet TAKE 1 TABLET BY MOUTH ONCE DAILY AT DINNER TIME (Patient taking differently: Take 5 mg by mouth daily with supper. TAKE 1 TABLET BY MOUTH ONCE DAILY AT DINNER TIME) 30 tablet 6  . apixaban (ELIQUIS) 2.5 MG TABS tablet Take 1 tablet (2.5 mg total) by mouth 2 (two) times daily. 1 tablet 0  . Ascorbic Acid (VITAMIN C) 250 MG CHEW Chew 250 mg by mouth daily.     . calcium citrate-vitamin D  NEW PATIENT EVALUATION  Name: Anita Benjamin  MRN: [id number]  Date:   07/07/2020     DOB: May 23, 1934   This 86 y.o. 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  REFERRING PHYSICIAN: Einar Pheasant, MD  CHIEF COMPLAINT:  Chief Complaint  Patient presents with  . Breast Cancer    Initial consultation    DIAGNOSIS: The encounter diagnosis was Malignant neoplasm of upper-outer quadrant of left breast in female, estrogen receptor positive ([place]).   PREVIOUS INVESTIGATIONS:  Mammogram and ultrasound reviewed Pathology report reviewed Clinical notes reviewed  HPI: Patient is a 86 year old female who presented with an abnormal mammogram of her left breast.  Back in July she had mammogram showing a possible mass and asymmetry in the left breast.  Further work-up including diagnostic tomograms showed a 5 x 4 mm irregular hypoechoic lesion in the left breast at the 4 o'clock position.  Biopsy was positive for ER positive invasive mammary carcinoma.  She went on to have a wide local excision for a 6 mm grade 2 invasive mammary carcinoma with margins clear at 0.2 mm.  2 sentinel lymph nodes were negative for metastatic disease.  Tumor was ER/PR positive HER-2/neu not overexpressed.  She is healing well the present time.  She is now referred to radiation oncology for consideration of treatment.  Her breast is still somewhat tender although healing extremely well.  PLANNED TREATMENT REGIMEN: Left hypofractionated whole breast radiation  PAST MEDICAL HISTORY:  has a past medical history of Anemia, Atrial fibrillation ([place]), Atrial flutter ([place]), Fibrocystic breast disease, GERD (gastroesophageal reflux disease), History of chicken pox, Hypercholesterolemia, and Hypertension.    PAST SURGICAL HISTORY:  Past Surgical History:  Procedure  Laterality Date  . BREAST BIOPSY Right 2014   NEG  . BREAST LUMPECTOMY WITH SENTINEL LYMPH NODE BIOPSY Left 06/04/2020   Procedure: BREAST LUMPECTOMY WITH SENTINEL LYMPH NODE BX;  Surgeon: Robert Bellow, MD;  Location: [facility] ORS;  Service: General;  Laterality: Left;  . CATARACT EXTRACTION    . DILATION AND CURETTAGE OF UTERUS    . Laser repair of torn retina      FAMILY HISTORY: family history includes Breast cancer in her paternal aunt; Cancer (age of onset: 12) in her father; Heart disease (age of onset: 12) in her mother.  SOCIAL HISTORY:  reports that she has never smoked. She has never used smokeless tobacco. She reports that she does not drink alcohol and does not use drugs.  ALLERGIES: Oruvail [ketoprofen], Relafen [nabumetone], and Timentin [ticarcillin-pot clavulanate]  MEDICATIONS:  Current Outpatient Medications  Medication Sig Dispense Refill  . Acetaminophen (TYLENOL ARTHRITIS PAIN PO) Take 1 tablet by mouth 2 (two) times daily.     Marland Kitchen acetaminophen (TYLENOL) 650 MG CR tablet Take 650 mg by mouth every 8 (eight) hours as needed for pain.    Marland Kitchen amLODipine (NORVASC) 5 MG tablet TAKE 1 TABLET BY MOUTH ONCE DAILY AT DINNER TIME (Patient taking differently: Take 5 mg by mouth daily with supper. TAKE 1 TABLET BY MOUTH ONCE DAILY AT DINNER TIME) 30 tablet 6  . apixaban (ELIQUIS) 2.5 MG TABS tablet Take 1 tablet (2.5 mg total) by mouth 2 (two) times daily. 1 tablet 0  . Ascorbic Acid (VITAMIN C) 250 MG CHEW Chew 250 mg by mouth daily.     . calcium citrate-vitamin D  NEW PATIENT EVALUATION  Name: Madelyn [place]  MRN: [id number]  Date:   07/07/2020     DOB: 08/13/1934   This 86 y.o. 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  REFERRING PHYSICIAN: Scott, Charlene, MD  CHIEF COMPLAINT:  Chief Complaint  Patient presents with  . Breast Cancer    Initial consultation    DIAGNOSIS: The encounter diagnosis was Malignant neoplasm of upper-outer quadrant of left breast in female, estrogen receptor positive ([facility]).   PREVIOUS INVESTIGATIONS:  Mammogram and ultrasound reviewed Pathology report reviewed Clinical notes reviewed  HPI: Patient is a 86-year-old female who presented with an abnormal mammogram of her left breast.  Back in July she had mammogram showing a possible mass and asymmetry in the left breast.  Further work-up including diagnostic tomograms showed a 5 x 4 mm irregular hypoechoic lesion in the left breast at the 4 o'clock position.  Biopsy was positive for ER positive invasive mammary carcinoma.  She went on to have a wide local excision for a 6 mm grade 2 invasive mammary carcinoma with margins clear at 0.2 mm.  2 sentinel lymph nodes were negative for metastatic disease.  Tumor was ER/PR positive HER-2/neu not overexpressed.  She is healing well the present time.  She is now referred to radiation oncology for consideration of treatment.  Her breast is still somewhat tender although healing extremely well.  PLANNED TREATMENT REGIMEN: Left hypofractionated whole breast radiation  PAST MEDICAL HISTORY:  has a past medical history of Anemia, Atrial fibrillation ([facility]), Atrial flutter ([facility]), Fibrocystic breast disease, GERD (gastroesophageal reflux disease), History of chicken pox, Hypercholesterolemia, and Hypertension.    PAST SURGICAL HISTORY:  Past Surgical History:  Procedure  Laterality Date  . BREAST BIOPSY Right 2014   NEG  . BREAST LUMPECTOMY WITH SENTINEL LYMPH NODE BIOPSY Left 06/04/2020   Procedure: BREAST LUMPECTOMY WITH SENTINEL LYMPH NODE BX;  Surgeon: Byrnett, Jeffrey W, MD;  Location: [facility] ORS;  Service: General;  Laterality: Left;  . CATARACT EXTRACTION    . DILATION AND CURETTAGE OF UTERUS    . Laser repair of torn retina      FAMILY HISTORY: family history includes Breast cancer in her paternal aunt; Cancer (age of onset: 71) in her father; Heart disease (age of onset: 76) in her mother.  SOCIAL HISTORY:  reports that she has never smoked. She has never used smokeless tobacco. She reports that she does not drink alcohol and does not use drugs.  ALLERGIES: Oruvail [ketoprofen], Relafen [nabumetone], and Timentin [ticarcillin-pot clavulanate]  MEDICATIONS:  Current Outpatient Medications  Medication Sig Dispense Refill  . Acetaminophen (TYLENOL ARTHRITIS PAIN PO) Take 1 tablet by mouth 2 (two) times daily.     . acetaminophen (TYLENOL) 650 MG CR tablet Take 650 mg by mouth every 8 (eight) hours as needed for pain.    . amLODipine (NORVASC) 5 MG tablet TAKE 1 TABLET BY MOUTH ONCE DAILY AT DINNER TIME (Patient taking differently: Take 5 mg by mouth daily with supper. TAKE 1 TABLET BY MOUTH ONCE DAILY AT DINNER TIME) 30 tablet 6  . apixaban (ELIQUIS) 2.5 MG TABS tablet Take 1 tablet (2.5 mg total) by mouth 2 (two) times daily. 1 tablet 0  . Ascorbic Acid (VITAMIN C) 250 MG CHEW Chew 250 mg by mouth daily.     . calcium citrate-vitamin D

## 2020-07-10 ENCOUNTER — Ambulatory Visit
Admission: RE | Admit: 2020-07-10 | Discharge: 2020-07-10 | Disposition: A | Discharge status: Home / Self Care | Payer: Medicare Other | Source: Ambulatory Visit | Attending: Radiation Oncology | Admitting: Radiation Oncology

## 2020-07-10 DIAGNOSIS — Z510 Encounter for antineoplastic radiation therapy: Secondary | ICD-10-CM | POA: Insufficient documentation

## 2020-07-10 DIAGNOSIS — C50412 Malignant neoplasm of upper-outer quadrant of left female breast: Secondary | ICD-10-CM | POA: Insufficient documentation

## 2020-07-10 DIAGNOSIS — Z170 Estrogen receptor positive status [ER+]: Secondary | ICD-10-CM | POA: Diagnosis not present

## 2020-07-11 ENCOUNTER — Other Ambulatory Visit: Payer: Self-pay | Admitting: *Deleted

## 2020-07-11 DIAGNOSIS — Z170 Estrogen receptor positive status [ER+]: Secondary | ICD-10-CM

## 2020-07-11 DIAGNOSIS — C50412 Malignant neoplasm of upper-outer quadrant of left female breast: Secondary | ICD-10-CM

## 2020-07-15 DIAGNOSIS — Z510 Encounter for antineoplastic radiation therapy: Secondary | ICD-10-CM | POA: Diagnosis not present

## 2020-07-17 ENCOUNTER — Ambulatory Visit: Payer: Medicare Other

## 2020-07-17 ENCOUNTER — Ambulatory Visit: Admission: RE | Admit: 2020-07-17 | Payer: Medicare Other | Source: Ambulatory Visit

## 2020-07-17 DIAGNOSIS — Z510 Encounter for antineoplastic radiation therapy: Secondary | ICD-10-CM | POA: Diagnosis not present

## 2020-07-21 ENCOUNTER — Ambulatory Visit
Admission: RE | Admit: 2020-07-21 | Discharge: 2020-07-21 | Disposition: A | Discharge status: Home / Self Care | Payer: Medicare Other | Source: Ambulatory Visit | Attending: Radiation Oncology | Admitting: Radiation Oncology

## 2020-07-21 DIAGNOSIS — Z510 Encounter for antineoplastic radiation therapy: Secondary | ICD-10-CM | POA: Diagnosis not present

## 2020-07-22 ENCOUNTER — Ambulatory Visit
Admission: RE | Admit: 2020-07-22 | Discharge: 2020-07-22 | Disposition: A | Discharge status: Home / Self Care | Payer: Medicare Other | Source: Ambulatory Visit | Attending: Radiation Oncology | Admitting: Radiation Oncology

## 2020-07-22 DIAGNOSIS — Z510 Encounter for antineoplastic radiation therapy: Secondary | ICD-10-CM | POA: Diagnosis not present

## 2020-07-23 ENCOUNTER — Ambulatory Visit
Admission: RE | Admit: 2020-07-23 | Discharge: 2020-07-23 | Disposition: A | Discharge status: Home / Self Care | Payer: Medicare Other | Source: Ambulatory Visit | Attending: Radiation Oncology | Admitting: Radiation Oncology

## 2020-07-23 DIAGNOSIS — Z510 Encounter for antineoplastic radiation therapy: Secondary | ICD-10-CM | POA: Diagnosis not present

## 2020-07-24 ENCOUNTER — Telehealth: Payer: Self-pay | Admitting: Internal Medicine

## 2020-07-24 ENCOUNTER — Ambulatory Visit
Admission: RE | Admit: 2020-07-24 | Discharge: 2020-07-24 | Disposition: A | Discharge status: Home / Self Care | Payer: Medicare Other | Source: Ambulatory Visit | Attending: Radiation Oncology | Admitting: Radiation Oncology

## 2020-07-24 DIAGNOSIS — Z510 Encounter for antineoplastic radiation therapy: Secondary | ICD-10-CM | POA: Diagnosis not present

## 2020-07-24 NOTE — Telephone Encounter (Signed)
Left message for patient to call back and schedule Medicare Annual Wellness Visit (AWV)   This should be a telephone visit only=30 minutes.  Last AWV 07/16/20; please schedule at anytime with Denisa O'Brien-Blaney at [facility].

## 2020-07-25 ENCOUNTER — Ambulatory Visit
Admission: RE | Admit: 2020-07-25 | Discharge: 2020-07-25 | Disposition: A | Discharge status: Home / Self Care | Payer: Medicare Other | Source: Ambulatory Visit | Attending: Radiation Oncology | Admitting: Radiation Oncology

## 2020-07-25 DIAGNOSIS — Z510 Encounter for antineoplastic radiation therapy: Secondary | ICD-10-CM | POA: Diagnosis not present

## 2020-07-28 ENCOUNTER — Other Ambulatory Visit: Payer: Self-pay

## 2020-07-28 ENCOUNTER — Ambulatory Visit
Admission: RE | Admit: 2020-07-28 | Discharge: 2020-07-28 | Disposition: A | Discharge status: Home / Self Care | Payer: Medicare Other | Source: Ambulatory Visit | Attending: Radiation Oncology | Admitting: Radiation Oncology

## 2020-07-28 ENCOUNTER — Other Ambulatory Visit (INDEPENDENT_AMBULATORY_CARE_PROVIDER_SITE_OTHER): Payer: Medicare Other

## 2020-07-28 DIAGNOSIS — I10 Essential (primary) hypertension: Secondary | ICD-10-CM | POA: Diagnosis not present

## 2020-07-28 DIAGNOSIS — E7800 Pure hypercholesterolemia, unspecified: Secondary | ICD-10-CM | POA: Diagnosis not present

## 2020-07-28 DIAGNOSIS — Z510 Encounter for antineoplastic radiation therapy: Secondary | ICD-10-CM | POA: Diagnosis not present

## 2020-07-28 LAB — BASIC METABOLIC PANEL
BUN: 18 mg/dL (ref 6–23)
CO2: 28 mEq/L (ref 19–32)
Calcium: 9.7 mg/dL (ref 8.4–10.5)
Chloride: 102 mEq/L (ref 96–112)
Creatinine, Ser: 0.95 mg/dL (ref 0.40–1.20)
GFR: 55.06 mL/min — ABNORMAL LOW (ref >60.00)
Glucose, Bld: 110 mg/dL — ABNORMAL HIGH (ref 70–99)
Potassium: 4.7 mEq/L (ref 3.5–5.1)
Sodium: 139 mEq/L (ref 135–145)

## 2020-07-28 LAB — HEPATIC FUNCTION PANEL
ALT: 14 U/L (ref 0–35)
AST: 19 U/L (ref 0–37)
Albumin: 4.4 g/dL (ref 3.5–5.2)
Alkaline Phosphatase: 61 U/L (ref 39–117)
Bilirubin, Direct: 0.1 mg/dL (ref 0.0–0.3)
Total Bilirubin: 0.6 mg/dL (ref 0.2–1.2)
Total Protein: 6.4 g/dL (ref 6.0–8.3)

## 2020-07-28 LAB — LIPID PANEL
Cholesterol: 209 mg/dL — ABNORMAL HIGH (ref 0–200)
HDL: 70.4 mg/dL (ref >39.00)
LDL Cholesterol: 111 mg/dL — ABNORMAL HIGH (ref 0–99)
NonHDL: 138.83
Total CHOL/HDL Ratio: 3
Triglycerides: 140 mg/dL (ref 0.0–149.0)
VLDL: 28 mg/dL (ref 0.0–40.0)

## 2020-07-29 ENCOUNTER — Ambulatory Visit
Admission: RE | Admit: 2020-07-29 | Discharge: 2020-07-29 | Disposition: A | Discharge status: Home / Self Care | Payer: Medicare Other | Source: Ambulatory Visit | Attending: Radiation Oncology | Admitting: Radiation Oncology

## 2020-07-29 DIAGNOSIS — Z510 Encounter for antineoplastic radiation therapy: Secondary | ICD-10-CM | POA: Diagnosis not present

## 2020-07-30 ENCOUNTER — Ambulatory Visit: Payer: Medicare Other | Admitting: Internal Medicine

## 2020-07-30 ENCOUNTER — Ambulatory Visit (INDEPENDENT_AMBULATORY_CARE_PROVIDER_SITE_OTHER): Payer: Medicare Other

## 2020-07-30 ENCOUNTER — Other Ambulatory Visit: Payer: Self-pay

## 2020-07-30 ENCOUNTER — Encounter: Payer: Self-pay | Admitting: Internal Medicine

## 2020-07-30 ENCOUNTER — Ambulatory Visit
Admission: RE | Admit: 2020-07-30 | Discharge: 2020-07-30 | Disposition: A | Discharge status: Home / Self Care | Payer: Medicare Other | Source: Ambulatory Visit | Attending: Radiation Oncology | Admitting: Radiation Oncology

## 2020-07-30 VITALS — BP 146/90 | HR 52 | Temp 97.8°F | Ht 67.0 in | Wt 131.2 lb

## 2020-07-30 DIAGNOSIS — F439 Reaction to severe stress, unspecified: Secondary | ICD-10-CM

## 2020-07-30 DIAGNOSIS — R55 Syncope and collapse: Secondary | ICD-10-CM

## 2020-07-30 DIAGNOSIS — M542 Cervicalgia: Secondary | ICD-10-CM

## 2020-07-30 DIAGNOSIS — C50919 Malignant neoplasm of unspecified site of unspecified female breast: Secondary | ICD-10-CM

## 2020-07-30 DIAGNOSIS — E7800 Pure hypercholesterolemia, unspecified: Secondary | ICD-10-CM

## 2020-07-30 DIAGNOSIS — Z510 Encounter for antineoplastic radiation therapy: Secondary | ICD-10-CM | POA: Diagnosis not present

## 2020-07-30 DIAGNOSIS — I483 Typical atrial flutter: Secondary | ICD-10-CM | POA: Diagnosis not present

## 2020-07-30 DIAGNOSIS — Z23 Encounter for immunization: Secondary | ICD-10-CM | POA: Diagnosis not present

## 2020-07-30 DIAGNOSIS — D72819 Decreased white blood cell count, unspecified: Secondary | ICD-10-CM

## 2020-07-30 DIAGNOSIS — I10 Essential (primary) hypertension: Secondary | ICD-10-CM

## 2020-07-30 NOTE — Progress Notes (Signed)
Patient ID: Anita Benjamin, female   DOB: 09-08-1934, 86 y.o.   MRN: [id number]   Subjective:    Patient ID: Anita Benjamin, female    DOB: 1934-06-29, 86 y.o.   MRN: [id number]  HPI This visit occurred during the SARS-CoV-2 public health emergency.  Safety protocols were in place, including screening questions prior to the visit, additional usage of staff PPE, and extensive cleaning of exam room while observing appropriate contact time as indicated for disinfecting solutions.  Patient here for a scheduled follow up. Is s/p left lumpectomy 06/24/20.  Receiving XRT.  Tolerating.  Was seen in ER 06/27/20 after a witnessed syncopal episode.  Had one prior to EMS arrival and per note, one after EMS arrived - when she went to stand up.  Heart rate initially 47.  Had some diarrhea earlier that day.  W/up otherwise unrevealing.  She reports since her ER visit, she has done relatively well.  No chest pain or sob.  Did go to lab on Monday and then went to her treatment.  Did not eat anything.  States her legs felt wobbly and heart rate was 150.  Ate, rested and heart rate improved.  Took one propranolol.  She has propranolol and diltiazem to take as needed for increased heart rate.  Since 07/03/20, she has been taking one propranolol per day and reports the episodes of increased heart rate/palpitations have improved.  Still having issues with her neck.  Increased pain with rotation of her head.  Left side of neck worse.  Limited rom.  Eating.  No nausea or vomiting.  Bowels moving.  No abdominal pain.    Past Medical History:  Diagnosis Date  . Anemia   . Atrial fibrillation ([place])   . Atrial flutter ([place])   . Fibrocystic breast disease   . GERD (gastroesophageal reflux disease)   . History of chicken pox   . Hypercholesterolemia   . Hypertension    Past Surgical History:  Procedure Laterality Date  . BREAST BIOPSY Right 2014   NEG  . BREAST LUMPECTOMY WITH SENTINEL LYMPH NODE BIOPSY Left 06/04/2020   Procedure:  BREAST LUMPECTOMY WITH SENTINEL LYMPH NODE BX;  Surgeon: Robert Bellow, MD;  Location: [facility] ORS;  Service: General;  Laterality: Left;  . CATARACT EXTRACTION    . DILATION AND CURETTAGE OF UTERUS    . Laser repair of torn retina     Family History  Problem Relation Age of Onset  . Heart disease Mother 68       Heart attack  . Cancer Father 60       lung cancer  . Breast cancer Paternal Aunt        x3  . Colon cancer Neg Hx    Social History   Socioeconomic History  . Marital status: Widowed    Spouse name: Not on file  . Number of children: 2  . Years of education: Not on file  . Highest education level: Not on file  Occupational History  . Not on file  Tobacco Use  . Smoking status: Never Smoker  . Smokeless tobacco: Never Used  Vaping Use  . Vaping Use: Never used  Substance and Sexual Activity  . Alcohol use: No    Alcohol/week: 0.0 standard drinks  . Drug use: No  . Sexual activity: Not on file  Other Topics Concern  . Not on file  Social History Narrative  . Not on file   Social Determinants of Health  Financial Resource Strain:   . Difficulty of Paying Living Expenses: Not on file  Food Insecurity:   . Worried About Charity fundraiser in the Last Year: Not on file  . Ran Out of Food in the Last Year: Not on file  Transportation Needs:   . Lack of Transportation (Medical): Not on file  . Lack of Transportation (Non-Medical): Not on file  Physical Activity:   . Days of Exercise per Week: Not on file  . Minutes of Exercise per Session: Not on file  Stress:   . Feeling of Stress : Not on file  Social Connections:   . Frequency of Communication with Friends and Family: Not on file  . Frequency of Social Gatherings with Friends and Family: Not on file  . Attends Religious Services: Not on file  . Active Member of Clubs or Organizations: Not on file  . Attends Archivist Meetings: Not on file  . Marital Status: Not on file    Outpatient  Encounter Medications as of 07/30/2020  Medication Sig  . acetaminophen (TYLENOL) 650 MG CR tablet Take 650 mg by mouth every 8 (eight) hours as needed for pain.  Marland Kitchen amLODipine (NORVASC) 5 MG tablet TAKE 1 TABLET BY MOUTH ONCE DAILY AT DINNER TIME (Patient taking differently: Take 5 mg by mouth daily with supper. TAKE 1 TABLET BY MOUTH ONCE DAILY AT [facility] TIME)  . apixaban (ELIQUIS) 2.5 MG TABS tablet Take 1 tablet (2.5 mg total) by mouth 2 (two) times daily.  . Ascorbic Acid (VITAMIN C) 250 MG CHEW Chew 250 mg by mouth daily.   . calcium citrate-vitamin D 500-500 MG-UNIT chewable tablet Chew 1 tablet by mouth daily.  . famotidine (PEPCID) 20 MG tablet Take 20 mg by mouth daily.  Marland Kitchen losartan (COZAAR) 100 MG tablet TAKE 1 TABLET BY MOUTH ONCE DAILY (Patient taking differently: Take 100 mg by mouth daily. )  . Multiple Vitamin (MULTIVITAMIN) tablet Take 1 tablet by mouth daily.  . propranolol (INDERAL) 10 MG tablet Take 10 mg by mouth 3 (three) times daily as needed (heart rate). Take 1st for rapid heart rate. If no response after 1 hour take diltiazem  . sertraline (ZOLOFT) 50 MG tablet Take 1 tablet (50 mg total) by mouth at bedtime.  . Acetaminophen (TYLENOL ARTHRITIS PAIN PO) Take 1 tablet by mouth 2 (two) times daily.  (Patient not taking: Reported on 07/30/2020)  . diltiazem (CARDIZEM) 30 MG tablet Take 1 tablet (30 mg total) by mouth 3 (three) times daily as needed (As needed for fast heart rates). (Patient not taking: Reported on 07/30/2020)  . HYDROcodone-acetaminophen (NORCO/VICODIN) 5-325 MG tablet Take 1 tablet by mouth every 4 (four) hours as needed for moderate pain. (Patient not taking: Reported on 07/30/2020)  . [DISCONTINUED] Calcium-Vitamin D-Vitamin K (VIACTIV CALCIUM PLUS D PO) Take 1 Dose by mouth daily.   No facility-administered encounter medications on file as of 07/30/2020.    Review of Systems  Constitutional: Negative for appetite change and unexpected weight change.    HENT: Negative for congestion and sinus pressure.   Respiratory: Negative for cough, chest tightness and shortness of breath.   Cardiovascular: Negative for chest pain and leg swelling.       Increased heart rate or palpitations.    Gastrointestinal: Negative for abdominal pain, diarrhea, nausea and vomiting.  Genitourinary: Negative for difficulty urinating and dysuria.  Musculoskeletal: Positive for neck pain. Negative for joint swelling and myalgias.  Skin: Negative for color change  and rash.  Neurological: Negative for dizziness, light-headedness and headaches.  Psychiatric/Behavioral: Negative for agitation and dysphoric mood.       Objective:    Physical Exam Vitals reviewed.  Constitutional:      General: She is not in acute distress.    Appearance: Normal appearance.  HENT:     Head: Normocephalic and atraumatic.     Right Ear: External ear normal.     Left Ear: External ear normal.  Eyes:     General: No scleral icterus.       Right eye: No discharge.        Left eye: No discharge.     Conjunctiva/sclera: Conjunctivae normal.  Neck:     Thyroid: No thyromegaly.  Cardiovascular:     Rate and Rhythm: Normal rate and regular rhythm.  Pulmonary:     Effort: No respiratory distress.     Breath sounds: Normal breath sounds. No wheezing.  Abdominal:     General: Bowel sounds are normal.     Palpations: Abdomen is soft.     Tenderness: There is no abdominal tenderness.  Musculoskeletal:        General: No swelling or tenderness.     Cervical back: Neck supple. No tenderness.  Lymphadenopathy:     Cervical: No cervical adenopathy.  Skin:    Findings: No erythema or rash.  Neurological:     Mental Status: She is alert.  Psychiatric:        Mood and Affect: Mood normal.        Behavior: Behavior normal.     BP (!) 146/90 (BP Location: Right Arm, Patient Position: Sitting, Cuff Size: Normal)   Pulse (!) 52   Temp 97.8 F (36.6 C) (Oral)   Ht 5\' 7"  (1.702 m)    Wt 131 lb 3.2 oz (59.5 kg)   LMP 10/27/1989   SpO2 97%   BMI 20.55 kg/m  Wt Readings from Last 3 Encounters:  07/30/20 131 lb 3.2 oz (59.5 kg)  07/07/20 130 lb 8 oz (59.2 kg)  06/04/20 130 lb 1.1 oz (59 kg)     Lab Results  Component Value Date   WBC 5.5 06/27/2020   HGB 11.9 (L) 06/27/2020   HCT 35.7 (L) 06/27/2020   PLT 277 06/27/2020   GLUCOSE 110 (H) 07/28/2020   CHOL 209 (H) 07/28/2020   TRIG 140.0 07/28/2020   HDL 70.40 07/28/2020   LDLDIRECT 117.7 11/06/2013   LDLCALC 111 (H) 07/28/2020   ALT 14 07/28/2020   AST 19 07/28/2020   NA 139 07/28/2020   K 4.7 07/28/2020   CL 102 07/28/2020   CREATININE 0.95 07/28/2020   BUN 18 07/28/2020   CO2 28 07/28/2020   TSH 2.00 02/28/2020       Assessment & Plan:   Problem List Items Addressed This Visit    Syncope    Had the previous syncopal episodes as outlined.  Evaluated in ER.  No recurring syncopal episodes. Had the episode of increased heart rate and outlined.  On propranolol daily now.  Feels better.  F/u with cardiology.       Stress    Increased stress.  On zoloft.  Stable.  Follow.        Neck pain    Persistent neck pain with limited rom.  Check s-spine xray.        Relevant Orders   DG Cervical Spine 2 or 3 views (Completed)   Leukopenia    Follow cbc.  Hypercholesterolemia    Previously tried crestor.  Off now.  Desires not to restart.  Follow lipid panel.       Essential hypertension    Continue losartan and amlodipine.  Follow pressures.  Follow metabolic panel.       Breast cancer ([place])    Recent diagnosis.  S/p lumpectomy.  Receiving XRT.  Follow.        Atrial flutter ([place])    On eliquis.  Has propranolol and diltiazem to take prn.  Recent episode of increased heart rate as outlined.  She has started back on propranolol - one tablet daily.  Resting EKG today SR/SB ventricular rate 48 with no acute ischemic changes.  Feels better taking the propranolol daily.  No chest pain or  sob.  Follow.  Confirm f/u with Dr Rockey Situ.        Relevant Orders   EKG 12-Lead    Other Visit Diagnoses    Needs flu shot    -  Primary   Relevant Orders   Flu Vaccine QUAD High Dose(Fluad) (Completed)       Einar Pheasant, MD

## 2020-07-30 NOTE — Progress Notes (Signed)
Patient having on going neck pain. States that when moving pain is 10/10. Can not turn the head to the left.   Patient was confused on how to take her Inderal and Cardizem. Instructions for both is TID as needed. Patient states she does not have a reference range of when to take her medication for elevated pulse. States she has just been taking Propranolol once a day.

## 2020-07-31 ENCOUNTER — Ambulatory Visit
Admission: RE | Admit: 2020-07-31 | Discharge: 2020-07-31 | Disposition: A | Discharge status: Home / Self Care | Payer: Medicare Other | Source: Ambulatory Visit | Attending: Radiation Oncology | Admitting: Radiation Oncology

## 2020-07-31 DIAGNOSIS — Z510 Encounter for antineoplastic radiation therapy: Secondary | ICD-10-CM | POA: Diagnosis not present

## 2020-08-01 ENCOUNTER — Ambulatory Visit: Payer: Medicare Other

## 2020-08-01 ENCOUNTER — Ambulatory Visit
Admission: RE | Admit: 2020-08-01 | Discharge: 2020-08-01 | Disposition: A | Discharge status: Home / Self Care | Payer: Medicare Other | Source: Ambulatory Visit | Attending: Radiation Oncology | Admitting: Radiation Oncology

## 2020-08-01 DIAGNOSIS — Z510 Encounter for antineoplastic radiation therapy: Secondary | ICD-10-CM | POA: Diagnosis not present

## 2020-08-04 ENCOUNTER — Other Ambulatory Visit: Payer: Self-pay

## 2020-08-04 ENCOUNTER — Inpatient Hospital Stay: Payer: Medicare Other | Attending: Radiation Oncology

## 2020-08-04 ENCOUNTER — Ambulatory Visit
Admission: RE | Admit: 2020-08-04 | Discharge: 2020-08-04 | Disposition: A | Discharge status: Home / Self Care | Payer: Medicare Other | Source: Ambulatory Visit | Attending: Radiation Oncology | Admitting: Radiation Oncology

## 2020-08-04 ENCOUNTER — Encounter: Payer: Self-pay | Admitting: Internal Medicine

## 2020-08-04 DIAGNOSIS — Z510 Encounter for antineoplastic radiation therapy: Secondary | ICD-10-CM | POA: Diagnosis present

## 2020-08-04 DIAGNOSIS — C50412 Malignant neoplasm of upper-outer quadrant of left female breast: Secondary | ICD-10-CM | POA: Diagnosis not present

## 2020-08-04 DIAGNOSIS — Z170 Estrogen receptor positive status [ER+]: Secondary | ICD-10-CM | POA: Insufficient documentation

## 2020-08-04 LAB — CBC
HCT: 39.4 % (ref 36.0–46.0)
Hemoglobin: 13.2 g/dL (ref 12.0–15.0)
MCH: 32.7 pg (ref 26.0–34.0)
MCHC: 33.5 g/dL (ref 30.0–36.0)
MCV: 97.5 fL (ref 80.0–100.0)
Platelets: 264 10*3/uL (ref 150–400)
RBC: 4.04 MIL/uL (ref 3.87–5.11)
RDW: 13.2 % (ref 11.5–15.5)
WBC: 4.8 10*3/uL (ref 4.0–10.5)
nRBC: 0 % (ref 0.0–0.2)

## 2020-08-04 NOTE — Assessment & Plan Note (Signed)
Increased stress.  On zoloft.  Stable.  Follow.

## 2020-08-04 NOTE — Assessment & Plan Note (Signed)
Recent diagnosis.  S/p lumpectomy.  Receiving XRT.  Follow.

## 2020-08-04 NOTE — Assessment & Plan Note (Signed)
Continue losartan and amlodipine.  Follow pressures.  Follow metabolic panel.  

## 2020-08-04 NOTE — Assessment & Plan Note (Signed)
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  Off now.  Desires not to restart.  Follow lipid panel.

## 2020-08-04 NOTE — Assessment & Plan Note (Signed)
Had the previous syncopal episodes as outlined.  Evaluated in ER.  No recurring syncopal episodes. Had the episode of increased heart rate and outlined.  On propranolol daily now.  Feels better.  F/u with cardiology.

## 2020-08-04 NOTE — Assessment & Plan Note (Signed)
Follow cbc.  

## 2020-08-04 NOTE — Assessment & Plan Note (Signed)
Persistent neck pain with limited rom.  Check s-spine xray.

## 2020-08-04 NOTE — Assessment & Plan Note (Signed)
On eliquis.  Has propranolol and diltiazem to take prn.  Recent episode of increased heart rate as outlined.  She has started back on propranolol - one tablet daily.  Resting EKG today SR/SB ventricular rate 48 with no acute ischemic changes.  Feels better taking the propranolol daily.  No chest pain or sob.  Follow.  Confirm f/u with Dr Rockey Situ.

## 2020-08-05 ENCOUNTER — Other Ambulatory Visit: Payer: Self-pay | Admitting: Internal Medicine

## 2020-08-05 ENCOUNTER — Ambulatory Visit
Admission: RE | Admit: 2020-08-05 | Discharge: 2020-08-05 | Disposition: A | Discharge status: Home / Self Care | Payer: Medicare Other | Source: Ambulatory Visit | Attending: Radiation Oncology | Admitting: Radiation Oncology

## 2020-08-05 DIAGNOSIS — Z510 Encounter for antineoplastic radiation therapy: Secondary | ICD-10-CM | POA: Diagnosis not present

## 2020-08-05 DIAGNOSIS — M542 Cervicalgia: Secondary | ICD-10-CM

## 2020-08-05 NOTE — Progress Notes (Signed)
Order placed for [facility] referral.

## 2020-08-06 ENCOUNTER — Ambulatory Visit
Admission: RE | Admit: 2020-08-06 | Discharge: 2020-08-06 | Disposition: A | Discharge status: Home / Self Care | Payer: Medicare Other | Source: Ambulatory Visit | Attending: Radiation Oncology | Admitting: Radiation Oncology

## 2020-08-06 DIAGNOSIS — Z510 Encounter for antineoplastic radiation therapy: Secondary | ICD-10-CM | POA: Diagnosis not present

## 2020-08-07 ENCOUNTER — Ambulatory Visit
Admission: RE | Admit: 2020-08-07 | Discharge: 2020-08-07 | Disposition: A | Discharge status: Home / Self Care | Payer: Medicare Other | Source: Ambulatory Visit | Attending: Radiation Oncology | Admitting: Radiation Oncology

## 2020-08-07 DIAGNOSIS — Z510 Encounter for antineoplastic radiation therapy: Secondary | ICD-10-CM | POA: Diagnosis not present

## 2020-08-08 ENCOUNTER — Ambulatory Visit
Admission: RE | Admit: 2020-08-08 | Discharge: 2020-08-08 | Disposition: A | Discharge status: Home / Self Care | Payer: Medicare Other | Source: Ambulatory Visit | Attending: Radiation Oncology | Admitting: Radiation Oncology

## 2020-08-08 DIAGNOSIS — Z510 Encounter for antineoplastic radiation therapy: Secondary | ICD-10-CM | POA: Diagnosis not present

## 2020-08-11 ENCOUNTER — Ambulatory Visit
Admission: RE | Admit: 2020-08-11 | Discharge: 2020-08-11 | Disposition: A | Discharge status: Home / Self Care | Payer: Medicare Other | Source: Ambulatory Visit | Attending: Radiation Oncology | Admitting: Radiation Oncology

## 2020-08-11 DIAGNOSIS — Z510 Encounter for antineoplastic radiation therapy: Secondary | ICD-10-CM | POA: Diagnosis not present

## 2020-08-12 ENCOUNTER — Ambulatory Visit
Admission: RE | Admit: 2020-08-12 | Discharge: 2020-08-12 | Disposition: A | Discharge status: Home / Self Care | Payer: Medicare Other | Source: Ambulatory Visit | Attending: Radiation Oncology | Admitting: Radiation Oncology

## 2020-08-12 DIAGNOSIS — Z510 Encounter for antineoplastic radiation therapy: Secondary | ICD-10-CM | POA: Diagnosis not present

## 2020-08-13 ENCOUNTER — Ambulatory Visit
Admission: RE | Admit: 2020-08-13 | Discharge: 2020-08-13 | Disposition: A | Discharge status: Home / Self Care | Payer: Medicare Other | Source: Ambulatory Visit | Attending: Radiation Oncology | Admitting: Radiation Oncology

## 2020-08-13 DIAGNOSIS — Z510 Encounter for antineoplastic radiation therapy: Secondary | ICD-10-CM | POA: Diagnosis not present

## 2020-08-14 ENCOUNTER — Ambulatory Visit
Admission: RE | Admit: 2020-08-14 | Discharge: 2020-08-14 | Disposition: A | Discharge status: Home / Self Care | Payer: Medicare Other | Source: Ambulatory Visit | Attending: Radiation Oncology | Admitting: Radiation Oncology

## 2020-08-14 DIAGNOSIS — Z510 Encounter for antineoplastic radiation therapy: Secondary | ICD-10-CM | POA: Diagnosis not present

## 2020-08-15 ENCOUNTER — Ambulatory Visit
Admission: RE | Admit: 2020-08-15 | Discharge: 2020-08-15 | Disposition: A | Discharge status: Home / Self Care | Payer: Medicare Other | Source: Ambulatory Visit | Attending: Radiation Oncology | Admitting: Radiation Oncology

## 2020-08-15 DIAGNOSIS — Z510 Encounter for antineoplastic radiation therapy: Secondary | ICD-10-CM | POA: Diagnosis not present

## 2020-08-18 ENCOUNTER — Ambulatory Visit
Admission: RE | Admit: 2020-08-18 | Discharge: 2020-08-18 | Disposition: A | Discharge status: Home / Self Care | Payer: Medicare Other | Source: Ambulatory Visit | Attending: Radiation Oncology | Admitting: Radiation Oncology

## 2020-08-18 DIAGNOSIS — Z510 Encounter for antineoplastic radiation therapy: Secondary | ICD-10-CM | POA: Diagnosis not present

## 2020-08-23 ENCOUNTER — Other Ambulatory Visit: Payer: Self-pay | Admitting: Internal Medicine

## 2020-09-17 ENCOUNTER — Encounter: Payer: Self-pay | Admitting: Radiation Oncology

## 2020-09-17 ENCOUNTER — Ambulatory Visit
Admission: RE | Admit: 2020-09-17 | Discharge: 2020-09-17 | Disposition: A | Discharge status: Home / Self Care | Payer: Medicare Other | Source: Ambulatory Visit | Attending: Radiation Oncology | Admitting: Radiation Oncology

## 2020-09-17 VITALS — BP 130/72 | HR 54 | Temp 97.0°F | Wt 130.5 lb

## 2020-09-17 DIAGNOSIS — C50412 Malignant neoplasm of upper-outer quadrant of left female breast: Secondary | ICD-10-CM

## 2020-09-17 NOTE — Progress Notes (Signed)
Radiation Oncology Follow up Note  Name: Anita Benjamin   Date:   09/17/2020 MRN:  [id number] DOB: 1933/11/10    This 86 y.o. female presents to the clinic today for 1 month follow-up status post whole breast radiation to her left breast status post wide local excision for stage Ia (T1b N0 M0) invasive mammary carcinoma ER positive.  REFERRING PROVIDER: Einar Pheasant, MD  HPI: Patient is a 86 year old female now at 1 month having completed adjuvant radiation therapy to her left breast for stage Ia invasive mammary carcinoma ER positive seen today in routine follow-up she is doing well.  She specifically denies breast tenderness cough or bone pain..  She has not yet started on antiestrogen therapy has an appointment next week with Dr. Tollie Pizza.  COMPLICATIONS OF TREATMENT: none  FOLLOW UP COMPLIANCE: keeps appointments   PHYSICAL EXAM:  BP 130/72   Pulse (!) 54   Temp (!) 97 F (36.1 C) (Tympanic)   Wt 130 lb 8 oz (59.2 kg)   LMP 10/27/1989   BMI 20.44 kg/m  Lungs are clear to A&P cardiac examination essentially unremarkable with regular rate and rhythm. No dominant mass or nodularity is noted in either breast in 2 positions examined. Incision is well-healed. No axillary or supraclavicular adenopathy is appreciated. Cosmetic result is excellent.  Well-developed well-nourished patient in NAD. HEENT reveals PERLA, EOMI, discs not visualized.  Oral cavity is clear. No oral mucosal lesions are identified. Neck is clear without evidence of cervical or supraclavicular adenopathy. Lungs are clear to A&P. Cardiac examination is essentially unremarkable with regular rate and rhythm without murmur rub or thrill. Abdomen is benign with no organomegaly or masses noted. Motor sensory and DTR levels are equal and symmetric in the upper and lower extremities. Cranial nerves II through XII are grossly intact. Proprioception is intact. No peripheral adenopathy or edema is identified. No motor or sensory levels  are noted. Crude visual fields are within normal range.  RADIOLOGY RESULTS: No current films to review  PLAN: Present time patient is doing well 1 month out from whole breast radiation with limited side effects.  I am pleased with her overall progress.  She will benefit from antiestrogen therapy will see Dr. Tollie Pizza next week for that.  Of asked to see her back in 4 to 5 months for follow-up.  Patient knows to call with any concerns.  I would like to take this opportunity to thank you for allowing me to participate in the care of your patient.Noreene Filbert, MD

## 2020-11-22 ENCOUNTER — Other Ambulatory Visit: Payer: Self-pay | Admitting: Internal Medicine

## 2020-11-24 ENCOUNTER — Other Ambulatory Visit: Payer: Self-pay | Admitting: Internal Medicine

## 2020-12-02 ENCOUNTER — Other Ambulatory Visit: Payer: Self-pay

## 2020-12-02 ENCOUNTER — Ambulatory Visit: Payer: Medicare Other | Admitting: Internal Medicine

## 2020-12-02 VITALS — BP 114/60 | HR 63 | Temp 98.1°F | Resp 16 | Ht 67.0 in | Wt 128.6 lb

## 2020-12-02 DIAGNOSIS — E7800 Pure hypercholesterolemia, unspecified: Secondary | ICD-10-CM | POA: Diagnosis not present

## 2020-12-02 DIAGNOSIS — F439 Reaction to severe stress, unspecified: Secondary | ICD-10-CM | POA: Diagnosis not present

## 2020-12-02 DIAGNOSIS — D72819 Decreased white blood cell count, unspecified: Secondary | ICD-10-CM | POA: Diagnosis not present

## 2020-12-02 DIAGNOSIS — C50919 Malignant neoplasm of unspecified site of unspecified female breast: Secondary | ICD-10-CM

## 2020-12-02 DIAGNOSIS — I10 Essential (primary) hypertension: Secondary | ICD-10-CM | POA: Diagnosis not present

## 2020-12-02 DIAGNOSIS — I483 Typical atrial flutter: Secondary | ICD-10-CM

## 2020-12-02 LAB — LIPID PANEL
Cholesterol: 216 mg/dL — ABNORMAL HIGH (ref 0–200)
HDL: 68.1 mg/dL (ref >39.00)
LDL Cholesterol: 117 mg/dL — ABNORMAL HIGH (ref 0–99)
NonHDL: 147.99
Total CHOL/HDL Ratio: 3
Triglycerides: 154 mg/dL — ABNORMAL HIGH (ref 0.0–149.0)
VLDL: 30.8 mg/dL (ref 0.0–40.0)

## 2020-12-02 LAB — BASIC METABOLIC PANEL
BUN: 20 mg/dL (ref 6–23)
CO2: 30 mEq/L (ref 19–32)
Calcium: 9.7 mg/dL (ref 8.4–10.5)
Chloride: 101 mEq/L (ref 96–112)
Creatinine, Ser: 0.86 mg/dL (ref 0.40–1.20)
GFR: 61.9 mL/min (ref >60.00)
Glucose, Bld: 84 mg/dL (ref 70–99)
Potassium: 5.2 mEq/L — ABNORMAL HIGH (ref 3.5–5.1)
Sodium: 139 mEq/L (ref 135–145)

## 2020-12-02 LAB — HEPATIC FUNCTION PANEL
ALT: 14 U/L (ref 0–35)
AST: 17 U/L (ref 0–37)
Albumin: 4.3 g/dL (ref 3.5–5.2)
Alkaline Phosphatase: 54 U/L (ref 39–117)
Bilirubin, Direct: 0.1 mg/dL (ref 0.0–0.3)
Total Bilirubin: 0.6 mg/dL (ref 0.2–1.2)
Total Protein: 6.8 g/dL (ref 6.0–8.3)

## 2020-12-02 LAB — TSH: TSH: 3.13 u[IU]/mL (ref 0.35–4.50)

## 2020-12-02 NOTE — Progress Notes (Signed)
Subjective:    Patient ID: Anita Benjamin, female    DOB: 1933/10/29, 87 y.o.   MRN: [id number]  HPI This visit occurred during the SARS-CoV-2 public health emergency.  Safety protocols were in place, including screening questions prior to the visit, additional usage of staff PPE, and extensive cleaning of exam room while observing appropriate contact time as indicated for disinfecting solutions.  Patient here for a scheduled follow up. Here to follow up regarding her blood pressure and cholesterol.  Also recent diagnosis of breast cancer.  Wanted to discuss antiestrogen post breast surgery.  Discussed with her today regarding risk/benefits.  Went over Dr Dwyane Luo recommendation.  Questions answered.  States she has been doing relatively well.  No chest pain or sob reported.  No abdominal pain.  Bowels moving.  Overall she feels from a cardiac standpoint, stable.  Blood pressure doing well.  Outside blood pressures ranging 112-130/60-70.      Past Medical History:  Diagnosis Date  . Anemia   . Atrial fibrillation ([place])   . Atrial flutter ([place])   . Fibrocystic breast disease   . GERD (gastroesophageal reflux disease)   . History of chicken pox   . Hypercholesterolemia   . Hypertension    Past Surgical History:  Procedure Laterality Date  . BREAST BIOPSY Right 2014   NEG  . BREAST LUMPECTOMY WITH SENTINEL LYMPH NODE BIOPSY Left 06/04/2020   Procedure: BREAST LUMPECTOMY WITH SENTINEL LYMPH NODE BX;  Surgeon: Robert Bellow, MD;  Location: [facility] ORS;  Service: General;  Laterality: Left;  . CATARACT EXTRACTION    . DILATION AND CURETTAGE OF UTERUS    . Laser repair of torn retina     Family History  Problem Relation Age of Onset  . Heart disease Mother 70       Heart attack  . Cancer Father 81       lung cancer  . Breast cancer Paternal Aunt        x3  . Colon cancer Neg Hx    Social History   Socioeconomic History  . Marital status: Widowed    Spouse name: Not on file  .  Number of children: 2  . Years of education: Not on file  . Highest education level: Not on file  Occupational History  . Not on file  Tobacco Use  . Smoking status: Never Smoker  . Smokeless tobacco: Never Used  Vaping Use  . Vaping Use: Never used  Substance and Sexual Activity  . Alcohol use: No    Alcohol/week: 0.0 standard drinks  . Drug use: No  . Sexual activity: Not on file  Other Topics Concern  . Not on file  Social History Narrative  . Not on file   Social Determinants of Health   Financial Resource Strain: Not on file  Food Insecurity: Not on file  Transportation Needs: Not on file  Physical Activity: Not on file  Stress: Not on file  Social Connections: Not on file    Outpatient Encounter Medications as of 12/02/2020  Medication Sig  . acetaminophen (TYLENOL) 650 MG CR tablet Take 650 mg by mouth every 8 (eight) hours as needed for pain.  Marland Kitchen amLODipine (NORVASC) 5 MG tablet TAKE 1 TABLET BY MOUTH ONCE DAILY AT DINNER TIME (Patient taking differently: Take 5 mg by mouth daily with supper. TAKE 1 TABLET BY MOUTH ONCE DAILY AT [facility] TIME)  . apixaban (ELIQUIS) 2.5 MG TABS tablet Take 1 tablet (2.5 mg total)  by mouth 2 (two) times daily.  . Ascorbic Acid (VITAMIN C) 250 MG CHEW Chew 250 mg by mouth daily.   . calcium citrate-vitamin D 500-500 MG-UNIT chewable tablet Chew 1 tablet by mouth daily.  . famotidine (PEPCID) 20 MG tablet Take 20 mg by mouth daily.  Marland Kitchen losartan (COZAAR) 100 MG tablet TAKE 1 TABLET BY MOUTH ONCE DAILY  . Multiple Vitamin (MULTIVITAMIN) tablet Take 1 tablet by mouth daily.  . propranolol (INDERAL) 10 MG tablet Take 10 mg by mouth 3 (three) times daily as needed (heart rate). Take 1st for rapid heart rate. If no response after 1 hour take diltiazem  . sertraline (ZOLOFT) 50 MG tablet TAKE 1 TABLET BY MOUTH AT BEDTIME  . [DISCONTINUED] Acetaminophen (TYLENOL ARTHRITIS PAIN PO) Take 1 tablet by mouth 2 (two) times daily.  (Patient not taking: No  sig reported)  . [DISCONTINUED] diltiazem (CARDIZEM) 30 MG tablet Take 1 tablet (30 mg total) by mouth 3 (three) times daily as needed (As needed for fast heart rates). (Patient not taking: No sig reported)  . [DISCONTINUED] HYDROcodone-acetaminophen (NORCO/VICODIN) 5-325 MG tablet Take 1 tablet by mouth every 4 (four) hours as needed for moderate pain. (Patient not taking: No sig reported)   No facility-administered encounter medications on file as of 12/02/2020.    Review of Systems  Constitutional: Negative for appetite change and unexpected weight change.  HENT: Negative for congestion and sinus pressure.   Respiratory: Negative for cough, chest tightness and shortness of breath.   Cardiovascular: Negative for chest pain, palpitations and leg swelling.  Gastrointestinal: Negative for abdominal pain, diarrhea, nausea and vomiting.  Genitourinary: Negative for difficulty urinating and dysuria.  Musculoskeletal: Negative for joint swelling and myalgias.  Skin: Negative for color change and rash.  Neurological: Negative for dizziness, light-headedness and headaches.  Psychiatric/Behavioral: Negative for agitation and dysphoric mood.       Objective:    Physical Exam Vitals reviewed.  Constitutional:      General: She is not in acute distress.    Appearance: Normal appearance.  HENT:     Head: Normocephalic and atraumatic.     Right Ear: External ear normal.     Left Ear: External ear normal.     Mouth/Throat:     Mouth: Oropharynx is clear and moist.  Eyes:     General: No scleral icterus.       Right eye: No discharge.        Left eye: No discharge.     Conjunctiva/sclera: Conjunctivae normal.  Neck:     Thyroid: No thyromegaly.  Cardiovascular:     Rate and Rhythm: Normal rate and regular rhythm.  Pulmonary:     Effort: No respiratory distress.     Breath sounds: Normal breath sounds. No wheezing.  Abdominal:     General: Bowel sounds are normal.     Palpations:  Abdomen is soft.     Tenderness: There is no abdominal tenderness.  Musculoskeletal:        General: No swelling, tenderness or edema.     Cervical back: Neck supple. No tenderness.  Lymphadenopathy:     Cervical: No cervical adenopathy.  Skin:    Findings: No erythema.  Neurological:     Mental Status: She is alert.  Psychiatric:        Mood and Affect: Mood normal.        Behavior: Behavior normal.     BP 114/60   Pulse 63   Temp 98.1  F (36.7 C) (Oral)   Resp 16   Ht 5\' 7"  (1.702 m)   Wt 128 lb 9.6 oz (58.3 kg)   LMP 10/27/1989   SpO2 98%   BMI 20.14 kg/m  Wt Readings from Last 3 Encounters:  12/02/20 128 lb 9.6 oz (58.3 kg)  09/17/20 130 lb 8 oz (59.2 kg)  07/30/20 131 lb 3.2 oz (59.5 kg)     Lab Results  Component Value Date   WBC 4.8 08/04/2020   HGB 13.2 08/04/2020   HCT 39.4 08/04/2020   PLT 264 08/04/2020   GLUCOSE 84 12/02/2020   CHOL 216 (H) 12/02/2020   TRIG 154.0 (H) 12/02/2020   HDL 68.10 12/02/2020   LDLDIRECT 117.7 11/06/2013   LDLCALC 117 (H) 12/02/2020   ALT 14 12/02/2020   AST 17 12/02/2020   NA 139 12/02/2020   K 4.6 12/05/2020   CL 101 12/02/2020   CREATININE 0.86 12/02/2020   BUN 20 12/02/2020   CO2 30 12/02/2020   TSH 3.13 12/02/2020       Assessment & Plan:   Problem List Items Addressed This Visit    Atrial flutter ([place])    Continue eliquis.  Has propranolol to take prn.  Rated controlled.  Follow.       Breast cancer ([place])    Recent diagnosis.  S/p lumpectomy.  S/p XRT.  Discussed antiestrogen therapy.  Questions answered.  She is going to contact Dr Bary Castilla regarding starting.        Essential hypertension    Blood pressure doing well on amlodipine and losartan.  Continue current medication regimen.  Follow pressures.  Follow metabolic panel.       Relevant Orders   Basic metabolic panel (Completed)   Hypercholesterolemia - Primary    Previously tried crestor.  Off.  Recheck cholesterol.  Pending lab review,  discuss restart statin medication.       Relevant Orders   TSH (Completed)   Lipid panel (Completed)   Hepatic function panel (Completed)   Leukopenia    Follow cbc.       Stress    On zoloft.  Discussed.  Appears to be doing well on the medication.  Follow.            Einar Pheasant, MD

## 2020-12-03 ENCOUNTER — Other Ambulatory Visit: Payer: Self-pay | Admitting: Internal Medicine

## 2020-12-03 DIAGNOSIS — E875 Hyperkalemia: Secondary | ICD-10-CM

## 2020-12-03 NOTE — Progress Notes (Signed)
Order placed for f/u potassium.  

## 2020-12-04 ENCOUNTER — Other Ambulatory Visit: Payer: Self-pay

## 2020-12-04 DIAGNOSIS — E875 Hyperkalemia: Secondary | ICD-10-CM

## 2020-12-05 ENCOUNTER — Other Ambulatory Visit
Admission: RE | Admit: 2020-12-05 | Discharge: 2020-12-05 | Disposition: A | Discharge status: Home / Self Care | Payer: Medicare Other | Attending: Internal Medicine | Admitting: Internal Medicine

## 2020-12-05 ENCOUNTER — Telehealth: Payer: Self-pay | Admitting: Internal Medicine

## 2020-12-05 DIAGNOSIS — E875 Hyperkalemia: Secondary | ICD-10-CM | POA: Diagnosis present

## 2020-12-05 LAB — POTASSIUM: Potassium: 4.6 mmol/L (ref 3.5–5.1)

## 2020-12-05 NOTE — Telephone Encounter (Signed)
Patient called to let Dr. Nicki Reaper know that she can not go to the hospital to have labs done today. She and her family have a stomach virus. Patient plans on going next week when she feels better.

## 2020-12-05 NOTE — Telephone Encounter (Signed)
Called patient. She wanted to know why it was necessary for her to go today. Advised that levels are elevated and can effect multiple things but also could be related to hemolysis. She does not actively have a stomach bug. She had some diarrhea for the last couple of days but today has been better. No other acute symptoms. [facility]

## 2020-12-07 ENCOUNTER — Encounter: Payer: Self-pay | Admitting: Internal Medicine

## 2020-12-07 NOTE — Assessment & Plan Note (Signed)
On zoloft.  Discussed.  Appears to be doing well on the medication.  Follow.

## 2020-12-07 NOTE — Assessment & Plan Note (Signed)
Recent diagnosis.  S/p lumpectomy.  S/p XRT.  Discussed antiestrogen therapy.  Questions answered.  She is going to contact Dr Bary Castilla regarding starting.

## 2020-12-07 NOTE — Assessment & Plan Note (Signed)
Blood pressure doing well on amlodipine and losartan.  Continue current medication regimen.  Follow pressures.  Follow metabolic panel.

## 2020-12-07 NOTE — Assessment & Plan Note (Signed)
Continue eliquis.  Has propranolol to take prn.  Rated controlled.  Follow.

## 2020-12-07 NOTE — Assessment & Plan Note (Signed)
Follow cbc.  

## 2020-12-07 NOTE — Assessment & Plan Note (Signed)
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  Off.  Recheck cholesterol.  Pending lab review, discuss restart statin medication.

## 2020-12-12 ENCOUNTER — Other Ambulatory Visit: Payer: Self-pay | Admitting: General Surgery

## 2020-12-12 DIAGNOSIS — C50912 Malignant neoplasm of unspecified site of left female breast: Secondary | ICD-10-CM

## 2020-12-16 ENCOUNTER — Telehealth: Payer: Self-pay | Admitting: Internal Medicine

## 2020-12-16 NOTE — Telephone Encounter (Signed)
Rejection Reason - Patient Declined - patient was contacted in November and she said that she would call back to schedule after radiation." Peggyann Shoals said on Dec 16, 2020 9:09 AM  "patient is going through radiation treatment right now. she will call next year to schedule appt." Unknown User said on Aug 12, 2020 10:19 AM  "left message to call back" Unknown User said on Aug 12, 2020 9:36 AM  [facility] neurosurgery

## 2020-12-22 ENCOUNTER — Ambulatory Visit (INDEPENDENT_AMBULATORY_CARE_PROVIDER_SITE_OTHER): Payer: Medicare Other

## 2020-12-22 ENCOUNTER — Ambulatory Visit
Admission: EM | Admit: 2020-12-22 | Discharge: 2020-12-22 | Disposition: A | Discharge status: Home / Self Care | Payer: Medicare Other | Source: Home / Self Care

## 2020-12-22 ENCOUNTER — Other Ambulatory Visit: Payer: Self-pay

## 2020-12-22 ENCOUNTER — Other Ambulatory Visit: Payer: Self-pay | Admitting: Cardiovascular Disease

## 2020-12-22 DIAGNOSIS — J210 Acute bronchiolitis due to respiratory syncytial virus: Secondary | ICD-10-CM | POA: Insufficient documentation

## 2020-12-22 DIAGNOSIS — Z20822 Contact with and (suspected) exposure to covid-19: Secondary | ICD-10-CM | POA: Insufficient documentation

## 2020-12-22 DIAGNOSIS — Z79899 Other long term (current) drug therapy: Secondary | ICD-10-CM | POA: Insufficient documentation

## 2020-12-22 DIAGNOSIS — Z7901 Long term (current) use of anticoagulants: Secondary | ICD-10-CM | POA: Insufficient documentation

## 2020-12-22 DIAGNOSIS — R059 Cough, unspecified: Secondary | ICD-10-CM | POA: Insufficient documentation

## 2020-12-22 DIAGNOSIS — J069 Acute upper respiratory infection, unspecified: Secondary | ICD-10-CM | POA: Insufficient documentation

## 2020-12-22 DIAGNOSIS — J13 Pneumonia due to Streptococcus pneumoniae: Secondary | ICD-10-CM | POA: Diagnosis not present

## 2020-12-22 DIAGNOSIS — Z880 Allergy status to penicillin: Secondary | ICD-10-CM | POA: Insufficient documentation

## 2020-12-22 DIAGNOSIS — J189 Pneumonia, unspecified organism: Secondary | ICD-10-CM | POA: Diagnosis not present

## 2020-12-22 LAB — RESP PANEL BY RT-PCR (FLU A&B, COVID) ARPGX2
Influenza A by PCR: NEGATIVE
Influenza B by PCR: NEGATIVE
SARS Coronavirus 2 by RT PCR: NEGATIVE

## 2020-12-22 MED ORDER — PROMETHAZINE-DM 6.25-15 MG/5ML PO SYRP: 5.0000 mL | ORAL_SOLUTION | Freq: Four times a day (QID) | ORAL | 0 refills | Status: DC | PRN

## 2020-12-22 MED ORDER — ONDANSETRON 8 MG PO TBDP: 8.0000 mg | ORAL_TABLET | Freq: Three times a day (TID) | ORAL | 0 refills | Status: DC | PRN

## 2020-12-22 MED ORDER — BENZONATATE 100 MG PO CAPS: 200.0000 mg | ORAL_CAPSULE | Freq: Three times a day (TID) | ORAL | 0 refills | Status: DC

## 2020-12-22 NOTE — Discharge Instructions (Addendum)
Your testing today did not show the presence of pneumonia, influenza, or Covid but it did show the presence of RSV or respiratory syncytial virus.  This is a viral respiratory infection which will have to run its course.  Treat your fever with over-the-counter Tylenol.  You can take 1000 mg every 6 hours as needed for fever.  Use the Tessalon Perles every 8 hours during the day as needed for cough and use the Promethazine DM cough syrup at bedtime as it will make you drowsy.  You can use the Zofran every 8 hours as needed for nausea.  Increase your oral fluid intake so that you your mucus thin.  If you develop any shortness of breath, rusty sputum production, or other concerning symptoms return for reevaluation or see your primary care provider.

## 2020-12-22 NOTE — ED Triage Notes (Signed)
Patient states that she has been having cough and fever since Friday. States that she has weakness as well.

## 2020-12-22 NOTE — ED Provider Notes (Signed)
[facility]    CSN: [id number] Arrival date & time: 12/22/20  1023      History   Chief Complaint Chief Complaint  Patient presents with  . Cough    HPI Anita Benjamin is a 87 y.o. female.   HPI   87 year old female here with family for evaluation of cough, fever, and weakness.  Patient reports that she has had symptoms for the last 3 days.  Patient has a fever in clinic of 100.9.  Patient suspects that she has had a fever for last 3 days but she takes Tylenol twice daily for arthritis at home which may have masked her fever.  Patient has had associated runny nose, nasal congestion, nausea, diarrhea, and a productive cough for a green sputum.  Patient reports that her ears are itchy but not painful.  Patient denies shortness of breath or wheezing, vomiting, or abdominal pain.  Patient has had her flu vaccine and has had her first to COVID shots but has not received her booster shot yet.  Its that her great grandchild has also been sick recently.  Past Medical History:  Diagnosis Date  . Anemia   . Atrial fibrillation ([place])   . Atrial flutter ([place])   . Fibrocystic breast disease   . GERD (gastroesophageal reflux disease)   . History of chicken pox   . Hypercholesterolemia   . Hypertension     Patient Active Problem List   Diagnosis Date Noted  . Breast cancer ([place]) 06/09/2020  . Diarrhea 04/11/2019  . Hair loss 06/10/2017  . Fatigue 02/19/2017  . Chest pain 02/12/2017  . Encounter for anticoagulation discussion and counseling 10/05/2016  . Cough 10/01/2016  . Gas 10/27/2015  . Dysuria 09/17/2015  . Left hip pain 07/21/2015  . Neck pain 06/09/2015  . Syncope 03/25/2015  . Left wrist pain 03/25/2015  . SOB (shortness of breath) 12/01/2014  . Abdominal discomfort 12/01/2014  . Atrial flutter ([place]) 11/15/2014  . Stress 11/08/2014  . Difficulty sleeping 11/08/2014  . Health care maintenance 11/08/2014  . Adjustment disorder 10/14/2014  . Tachycardia  07/17/2014  . Hyperkalemia 07/17/2014  . History of colonic polyps 03/09/2014  . Decreased sense of taste 03/09/2014  . Abnormal mammogram, unspecified 07/31/2013  . Abnormal mammogram 01/20/2013  . Essential hypertension 10/29/2012  . Hypercholesterolemia 10/29/2012  . Leukopenia 10/29/2012    Past Surgical History:  Procedure Laterality Date  . BREAST BIOPSY Right 2014   NEG  . BREAST LUMPECTOMY WITH SENTINEL LYMPH NODE BIOPSY Left 06/04/2020   Procedure: BREAST LUMPECTOMY WITH SENTINEL LYMPH NODE BX;  Surgeon: Robert Bellow, MD;  Location: [facility] ORS;  Service: General;  Laterality: Left;  . CATARACT EXTRACTION    . DILATION AND CURETTAGE OF UTERUS    . Laser repair of torn retina      OB History   No obstetric history on file.      Home Medications    Prior to Admission medications   Medication Sig Start Date End Date Taking? Authorizing Provider  acetaminophen (TYLENOL) 650 MG CR tablet Take 650 mg by mouth every 8 (eight) hours as needed for pain.   Yes [provider]  amLODipine (NORVASC) 5 MG tablet TAKE 1 TABLET BY MOUTH ONCE DAILY AT [facility] TIME Patient taking differently: Take 5 mg by mouth daily with supper. TAKE 1 TABLET BY MOUTH ONCE DAILY AT [facility] TIME 05/23/20  Yes Minna Merritts, MD  apixaban (ELIQUIS) 2.5 MG TABS tablet Take 1 tablet (  2.5 mg total) by mouth 2 (two) times daily. 06/07/20  Yes Byrnett, Forest Gleason, MD  Ascorbic Acid (VITAMIN C) 250 MG CHEW Chew 250 mg by mouth daily.    Yes [provider]  benzonatate (TESSALON) 100 MG capsule Take 2 capsules (200 mg total) by mouth every 8 (eight) hours. 12/22/20  Yes Margarette Canada, NP  calcium citrate-vitamin D 500-500 MG-UNIT chewable tablet Chew 1 tablet by mouth daily.   Yes [provider]  famotidine (PEPCID) 20 MG tablet Take 20 mg by mouth daily.   Yes [provider]  losartan (COZAAR) 100 MG tablet TAKE 1 TABLET BY MOUTH ONCE DAILY 08/25/20  Yes Einar Pheasant, MD   Multiple Vitamin (MULTIVITAMIN) tablet Take 1 tablet by mouth daily.   Yes [provider]  ondansetron (ZOFRAN ODT) 8 MG disintegrating tablet Take 1 tablet (8 mg total) by mouth every 8 (eight) hours as needed for nausea or vomiting. 12/22/20  Yes Margarette Canada, NP  promethazine-dextromethorphan (PROMETHAZINE-DM) 6.25-15 MG/5ML syrup Take 5 mLs by mouth 4 (four) times daily as needed. 12/22/20  Yes Margarette Canada, NP  propranolol (INDERAL) 10 MG tablet Take 10 mg by mouth 3 (three) times daily as needed (heart rate). Take 1st for rapid heart rate. If no response after 1 hour take diltiazem   Yes [provider]  sertraline (ZOLOFT) 50 MG tablet TAKE 1 TABLET BY MOUTH AT BEDTIME 11/24/20  Yes Einar Pheasant, MD    Family History Family History  Problem Relation Age of Onset  . Heart disease Mother 39       Heart attack  . Cancer Father 29       lung cancer  . Breast cancer Paternal Aunt        x3  . Colon cancer Neg Hx     Social History Social History   Tobacco Use  . Smoking status: Never Smoker  . Smokeless tobacco: Never Used  Vaping Use  . Vaping Use: Never used  Substance Use Topics  . Alcohol use: No    Alcohol/week: 0.0 standard drinks  . Drug use: No     Allergies   Oruvail [ketoprofen], Relafen [nabumetone], and Timentin [ticarcillin-pot clavulanate]   Review of Systems Review of Systems  Constitutional: Positive for fatigue and fever.  HENT: Positive for congestion and rhinorrhea. Negative for ear pain.   Respiratory: Positive for cough. Negative for shortness of breath.   Gastrointestinal: Positive for diarrhea and nausea. Negative for abdominal pain and vomiting.  Skin: Negative for rash.  Hematological: Negative.   Psychiatric/Behavioral: Negative.      Physical Exam Triage Vital Signs ED Triage Vitals  Enc Vitals Group     BP 12/22/20 1108 132/66     Pulse Rate 12/22/20 1108 81     Resp 12/22/20 1108 (!) 28     Temp 12/22/20 1108  (!) 100.9 F (38.3 C)     Temp Source 12/22/20 1108 Oral     SpO2 12/22/20 1108 95 %     Weight 12/22/20 1107 130 lb (59 kg)     Height 12/22/20 1107 5\' 7"  (1.702 m)     Head Circumference --      Peak Flow --      Pain Score 12/22/20 1106 6     Pain Loc --      Pain Edu? --      Excl. in [place]? --    No data found.  Updated Vital Signs BP 132/66 (BP Location:  Right Arm)   Pulse 81   Temp (!) 100.9 F (38.3 C) (Oral)   Resp (!) 28   Ht 5\' 7"  (1.702 m)   Wt 130 lb (59 kg)   LMP 10/27/1989   SpO2 95%   BMI 20.36 kg/m   Visual Acuity Right Eye Distance:   Left Eye Distance:   Bilateral Distance:    Right Eye Near:   Left Eye Near:    Bilateral Near:     Physical Exam Vitals and nursing note reviewed.  Constitutional:      Appearance: She is ill-appearing.  HENT:     Head: Normocephalic and atraumatic.     Right Ear: Tympanic membrane, ear canal and external ear normal.     Left Ear: Tympanic membrane, ear canal and external ear normal.     Nose: Congestion and rhinorrhea present.     Mouth/Throat:     Mouth: Mucous membranes are moist.     Pharynx: Oropharynx is clear.  Cardiovascular:     Rate and Rhythm: Normal rate and regular rhythm.     Pulses: Normal pulses.     Heart sounds: Normal heart sounds. No murmur heard. No gallop.   Pulmonary:     Effort: Pulmonary effort is normal.     Breath sounds: No wheezing, rhonchi or rales.  Musculoskeletal:     Cervical back: Normal range of motion and neck supple.  Lymphadenopathy:     Cervical: No cervical adenopathy.  Skin:    General: Skin is warm and dry.     Capillary Refill: Capillary refill takes less than 2 seconds.     Findings: No erythema.  Neurological:     General: No focal deficit present.     Mental Status: She is alert and oriented to person, place, and time.  Psychiatric:        Mood and Affect: Mood normal.        Behavior: Behavior normal.        Thought Content: Thought content normal.         Judgment: Judgment normal.      UC Treatments / Results  Labs (all labs ordered are listed, but only abnormal results are displayed) Labs Reviewed  RESP PANEL BY RT-PCR (FLU A&B, COVID) [id number]    EKG   Radiology DG Chest 2 View  Result Date: 12/22/2020 CLINICAL DATA:  87 year old female with history of cough, fever and weakness. History of breast cancer. EXAM: CHEST - 2 VIEW COMPARISON:  Chest x-ray 06/27/2020. FINDINGS: Lung volumes are normal. No consolidative airspace disease. No pleural effusions. No pneumothorax. Areas of pleuroparenchymal thickening and architectural distortion in the apices of both lungs, similar to prior studies, most compatible with chronic post infectious or inflammatory scarring. No pulmonary nodule or mass noted. Pulmonary vasculature and the cardiomediastinal silhouette are within normal limits. Atherosclerotic calcifications in the thoracic aorta. IMPRESSION: 1. No radiographic evidence of acute cardiopulmonary disease. 2. Aortic atherosclerosis. Electronically Signed   By: Vinnie Langton M.D.   On: 12/22/2020 12:06    Procedures Procedures (including critical care time)  Medications Ordered in UC Medications - No data to display  Initial Impression / Assessment and Plan / UC Course  I have reviewed the triage vital signs and the nursing notes.  Pertinent labs & imaging results that were available during my care of the patient were reviewed by me and considered in my medical decision making (see chart for details).   Is a very pleasant 87 year old female  here with family who looks like she does not feel well.  Patient has been complaining of lower respiratory symptoms for the last 3 days to include a productive cough for green sputum.  Physical exam reveals pearly gray tympanic membranes bilaterally with normal light reflex.  Both external auditory canals have mild amounts of cerumen but it is not impacted or obstructing the tympanic membrane.   Patient's nasal mucosa is mildly erythematous and edematous with clear nasal discharge.  Posterior oropharynx is unremarkable.  No cervical lymphadenopathy appreciated on exam.  Lung sounds are diminished in all fields.  Patient has been vaccine against flu and COVID but has not received her COVID booster.  Will check patient for flu and obtain chest x-ray to rule out infiltrate.  Chest x-ray intermittently evaluated by me.  Interpretation: Questionable developing infiltrate in the right lower lobe.  The rest of the lung fields appear clear.  Awaiting radiology overread.  Radiology interpretation is that is negative for infiltrate or acute intrathoracic process.  Patient is negative for Covid or flu.  Patient is positive for RSV.  We will discharge patient home with a diagnosis of viral URI and cough.  Will give Tessalon Perles to help with cough during the day and Promethazine DM cough syrup for bedtime.  We will also give prescription for Zofran to help with nausea.   Final Clinical Impressions(s) / UC Diagnoses   Final diagnoses:  Acute bronchiolitis due to respiratory syncytial virus (RSV)  Viral URI with cough     Discharge Instructions     Your testing today did not show the presence of pneumonia, influenza, or Covid but it did show the presence of RSV or respiratory syncytial virus.  This is a viral respiratory infection which will have to run its course.  Treat your fever with over-the-counter Tylenol.  You can take 1000 mg every 6 hours as needed for fever.  Use the Tessalon Perles every 8 hours during the day as needed for cough and use the Promethazine DM cough syrup at bedtime as it will make you drowsy.  You can use the Zofran every 8 hours as needed for nausea.  Increase your oral fluid intake so that you your mucus thin.  If you develop any shortness of breath, rusty sputum production, or other concerning symptoms return for reevaluation or see your primary care  provider.    ED Prescriptions    Medication Sig Dispense Auth. Provider   benzonatate (TESSALON) 100 MG capsule Take 2 capsules (200 mg total) by mouth every 8 (eight) hours. 21 capsule Margarette Canada, NP   promethazine-dextromethorphan (PROMETHAZINE-DM) 6.25-15 MG/5ML syrup Take 5 mLs by mouth 4 (four) times daily as needed. 118 mL Margarette Canada, NP   ondansetron (ZOFRAN ODT) 8 MG disintegrating tablet Take 1 tablet (8 mg total) by mouth every 8 (eight) hours as needed for nausea or vomiting. 20 tablet Margarette Canada, NP     PDMP not reviewed this encounter.   Margarette Canada, NP 12/22/20 1215

## 2020-12-25 ENCOUNTER — Emergency Department: Payer: Medicare Other

## 2020-12-25 ENCOUNTER — Inpatient Hospital Stay
Admission: EM | Admit: 2020-12-25 | Discharge: 2020-12-31 | DRG: 193 | Disposition: A | Discharge status: Home Health | Payer: Medicare Other | Attending: Family Medicine | Admitting: Family Medicine

## 2020-12-25 DIAGNOSIS — D649 Anemia, unspecified: Secondary | ICD-10-CM

## 2020-12-25 DIAGNOSIS — Z7901 Long term (current) use of anticoagulants: Secondary | ICD-10-CM

## 2020-12-25 DIAGNOSIS — A419 Sepsis, unspecified organism: Secondary | ICD-10-CM

## 2020-12-25 DIAGNOSIS — R0902 Hypoxemia: Secondary | ICD-10-CM

## 2020-12-25 DIAGNOSIS — R6520 Severe sepsis without septic shock: Secondary | ICD-10-CM | POA: Diagnosis not present

## 2020-12-25 DIAGNOSIS — E861 Hypovolemia: Secondary | ICD-10-CM | POA: Diagnosis present

## 2020-12-25 DIAGNOSIS — Z20822 Contact with and (suspected) exposure to covid-19: Secondary | ICD-10-CM | POA: Diagnosis present

## 2020-12-25 DIAGNOSIS — K219 Gastro-esophageal reflux disease without esophagitis: Secondary | ICD-10-CM | POA: Diagnosis present

## 2020-12-25 DIAGNOSIS — I4892 Unspecified atrial flutter: Secondary | ICD-10-CM | POA: Diagnosis present

## 2020-12-25 DIAGNOSIS — Z853 Personal history of malignant neoplasm of breast: Secondary | ICD-10-CM

## 2020-12-25 DIAGNOSIS — E878 Other disorders of electrolyte and fluid balance, not elsewhere classified: Secondary | ICD-10-CM | POA: Diagnosis present

## 2020-12-25 DIAGNOSIS — F32A Depression, unspecified: Secondary | ICD-10-CM | POA: Diagnosis present

## 2020-12-25 DIAGNOSIS — Z803 Family history of malignant neoplasm of breast: Secondary | ICD-10-CM | POA: Diagnosis not present

## 2020-12-25 DIAGNOSIS — J13 Pneumonia due to Streptococcus pneumoniae: Secondary | ICD-10-CM | POA: Diagnosis present

## 2020-12-25 DIAGNOSIS — D509 Iron deficiency anemia, unspecified: Secondary | ICD-10-CM | POA: Diagnosis present

## 2020-12-25 DIAGNOSIS — J918 Pleural effusion in other conditions classified elsewhere: Secondary | ICD-10-CM | POA: Diagnosis present

## 2020-12-25 DIAGNOSIS — J154 Pneumonia due to other streptococci: Secondary | ICD-10-CM | POA: Diagnosis not present

## 2020-12-25 DIAGNOSIS — Z801 Family history of malignant neoplasm of trachea, bronchus and lung: Secondary | ICD-10-CM

## 2020-12-25 DIAGNOSIS — Z8249 Family history of ischemic heart disease and other diseases of the circulatory system: Secondary | ICD-10-CM

## 2020-12-25 DIAGNOSIS — I480 Paroxysmal atrial fibrillation: Secondary | ICD-10-CM | POA: Diagnosis present

## 2020-12-25 DIAGNOSIS — J9601 Acute respiratory failure with hypoxia: Secondary | ICD-10-CM

## 2020-12-25 DIAGNOSIS — J189 Pneumonia, unspecified organism: Secondary | ICD-10-CM | POA: Diagnosis present

## 2020-12-25 DIAGNOSIS — I10 Essential (primary) hypertension: Secondary | ICD-10-CM

## 2020-12-25 DIAGNOSIS — I119 Hypertensive heart disease without heart failure: Secondary | ICD-10-CM | POA: Diagnosis present

## 2020-12-25 DIAGNOSIS — E871 Hypo-osmolality and hyponatremia: Secondary | ICD-10-CM

## 2020-12-25 DIAGNOSIS — A408 Other streptococcal sepsis: Secondary | ICD-10-CM | POA: Diagnosis not present

## 2020-12-25 DIAGNOSIS — Z888 Allergy status to other drugs, medicaments and biological substances status: Secondary | ICD-10-CM | POA: Diagnosis not present

## 2020-12-25 DIAGNOSIS — E1165 Type 2 diabetes mellitus with hyperglycemia: Secondary | ICD-10-CM | POA: Diagnosis present

## 2020-12-25 DIAGNOSIS — I4891 Unspecified atrial fibrillation: Secondary | ICD-10-CM

## 2020-12-25 DIAGNOSIS — Z79899 Other long term (current) drug therapy: Secondary | ICD-10-CM

## 2020-12-25 DIAGNOSIS — Z79811 Long term (current) use of aromatase inhibitors: Secondary | ICD-10-CM

## 2020-12-25 DIAGNOSIS — J90 Pleural effusion, not elsewhere classified: Secondary | ICD-10-CM

## 2020-12-25 DIAGNOSIS — F419 Anxiety disorder, unspecified: Secondary | ICD-10-CM | POA: Diagnosis present

## 2020-12-25 DIAGNOSIS — Z9889 Other specified postprocedural states: Secondary | ICD-10-CM

## 2020-12-25 LAB — CBC WITH DIFFERENTIAL/PLATELET
Abs Immature Granulocytes: 0.13 10*3/uL — ABNORMAL HIGH (ref 0.00–0.07)
Basophils Absolute: 0 10*3/uL (ref 0.0–0.1)
Basophils Relative: 0 %
Eosinophils Absolute: 0 10*3/uL (ref 0.0–0.5)
Eosinophils Relative: 0 %
HCT: 31.2 % — ABNORMAL LOW (ref 36.0–46.0)
Hemoglobin: 10.8 g/dL — ABNORMAL LOW (ref 12.0–15.0)
Immature Granulocytes: 1 %
Lymphocytes Relative: 5 %
Lymphs Abs: 0.5 10*3/uL — ABNORMAL LOW (ref 0.7–4.0)
MCH: 31.7 pg (ref 26.0–34.0)
MCHC: 34.6 g/dL (ref 30.0–36.0)
MCV: 91.5 fL (ref 80.0–100.0)
Monocytes Absolute: 0.3 10*3/uL (ref 0.1–1.0)
Monocytes Relative: 3 %
Neutro Abs: 9.1 10*3/uL — ABNORMAL HIGH (ref 1.7–7.7)
Neutrophils Relative %: 91 %
Platelets: 320 10*3/uL (ref 150–400)
RBC: 3.41 MIL/uL — ABNORMAL LOW (ref 3.87–5.11)
RDW: 13.4 % (ref 11.5–15.5)
WBC: 10.1 10*3/uL (ref 4.0–10.5)
nRBC: 0 % (ref 0.0–0.2)

## 2020-12-25 LAB — BASIC METABOLIC PANEL
Anion gap: 10 (ref 5–15)
BUN: 19 mg/dL (ref 8–23)
CO2: 25 mmol/L (ref 22–32)
Calcium: 9 mg/dL (ref 8.9–10.3)
Chloride: 92 mmol/L — ABNORMAL LOW (ref 98–111)
Creatinine, Ser: 1.16 mg/dL — ABNORMAL HIGH (ref 0.44–1.00)
GFR, Estimated: 46 mL/min — ABNORMAL LOW (ref >60)
Glucose, Bld: 126 mg/dL — ABNORMAL HIGH (ref 70–99)
Potassium: 4.3 mmol/L (ref 3.5–5.1)
Sodium: 127 mmol/L — ABNORMAL LOW (ref 135–145)

## 2020-12-25 LAB — RESP PANEL BY RT-PCR (FLU A&B, COVID) ARPGX2
Influenza A by PCR: NEGATIVE
Influenza B by PCR: NEGATIVE
SARS Coronavirus 2 by RT PCR: NEGATIVE

## 2020-12-25 LAB — TROPONIN I (HIGH SENSITIVITY)
Troponin I (High Sensitivity): 16 ng/L (ref <18)
Troponin I (High Sensitivity): 12 ng/L (ref <18)

## 2020-12-25 LAB — BRAIN NATRIURETIC PEPTIDE: B Natriuretic Peptide: 216.5 pg/mL — ABNORMAL HIGH (ref 0.0–100.0)

## 2020-12-25 LAB — PROCALCITONIN: Procalcitonin: 1.02 ng/mL

## 2020-12-25 LAB — LACTIC ACID, PLASMA: Lactic Acid, Venous: 1.7 mmol/L (ref 0.5–1.9)

## 2020-12-25 MED ORDER — ENOXAPARIN SODIUM 40 MG/0.4ML ~~LOC~~ SOLN: 40.0000 mg | SUBCUTANEOUS | Status: DC

## 2020-12-25 MED ORDER — ADULT MULTIVITAMIN W/MINERALS CH
1.0000 | ORAL_TABLET | Freq: Every day | ORAL | Status: DC
  Administered 2020-12-26 – 2020-12-31 (×6): 1 via ORAL
  Filled 2020-12-25 (×7): qty 1

## 2020-12-25 MED ORDER — TRAZODONE HCL 50 MG PO TABS: 25.0000 mg | ORAL_TABLET | Freq: Every evening | ORAL | Status: DC | PRN

## 2020-12-25 MED ORDER — SODIUM CHLORIDE 0.9 % IV SOLN
1.0000 g | Freq: Once | INTRAVENOUS | Status: AC
  Administered 2020-12-25: 1 g via INTRAVENOUS
  Filled 2020-12-25: qty 10

## 2020-12-25 MED ORDER — LOSARTAN POTASSIUM 50 MG PO TABS
100.0000 mg | ORAL_TABLET | Freq: Every day | ORAL | Status: DC
  Administered 2020-12-26 – 2020-12-31 (×6): 100 mg via ORAL
  Filled 2020-12-25 (×6): qty 2

## 2020-12-25 MED ORDER — CALCIUM CITRATE-VITAMIN D 500-500 MG-UNIT PO CHEW
1.0000 | CHEWABLE_TABLET | Freq: Every day | ORAL | Status: DC
  Administered 2020-12-27 – 2020-12-31 (×5): 1 via ORAL
  Filled 2020-12-25 (×6): qty 1

## 2020-12-25 MED ORDER — IPRATROPIUM-ALBUTEROL 0.5-2.5 (3) MG/3ML IN SOLN
3.0000 mL | Freq: Four times a day (QID) | RESPIRATORY_TRACT | Status: DC
  Administered 2020-12-26 – 2020-12-27 (×5): 3 mL via RESPIRATORY_TRACT
  Filled 2020-12-25 (×6): qty 3

## 2020-12-25 MED ORDER — APIXABAN 2.5 MG PO TABS
2.5000 mg | ORAL_TABLET | Freq: Two times a day (BID) | ORAL | Status: DC
  Administered 2020-12-26 – 2020-12-31 (×11): 2.5 mg via ORAL
  Filled 2020-12-25 (×13): qty 1

## 2020-12-25 MED ORDER — BENZONATATE 100 MG PO CAPS
200.0000 mg | ORAL_CAPSULE | Freq: Three times a day (TID) | ORAL | Status: DC
  Administered 2020-12-26 – 2020-12-31 (×17): 200 mg via ORAL
  Filled 2020-12-25 (×17): qty 2

## 2020-12-25 MED ORDER — PROPRANOLOL HCL 10 MG PO TABS
10.0000 mg | ORAL_TABLET | Freq: Three times a day (TID) | ORAL | Status: DC | PRN
Start: 2020-12-25 — End: 2020-12-31
  Filled 2020-12-25: qty 1

## 2020-12-25 MED ORDER — MAGNESIUM HYDROXIDE 400 MG/5ML PO SUSP: 30.0000 mL | Freq: Every day | ORAL | Status: DC | PRN

## 2020-12-25 MED ORDER — SODIUM CHLORIDE 0.9 % IV SOLN: INTRAVENOUS | Status: DC

## 2020-12-25 MED ORDER — AMLODIPINE BESYLATE 5 MG PO TABS
5.0000 mg | ORAL_TABLET | Freq: Every day | ORAL | Status: DC
  Administered 2020-12-27: 5 mg via ORAL
  Filled 2020-12-25 (×2): qty 1

## 2020-12-25 MED ORDER — SERTRALINE HCL 50 MG PO TABS
50.0000 mg | ORAL_TABLET | Freq: Every day | ORAL | Status: DC
  Administered 2020-12-26 – 2020-12-30 (×5): 50 mg via ORAL
  Filled 2020-12-25 (×5): qty 1

## 2020-12-25 MED ORDER — ACETAMINOPHEN 325 MG PO TABS
650.0000 mg | ORAL_TABLET | Freq: Four times a day (QID) | ORAL | Status: DC | PRN
  Administered 2020-12-29: 650 mg via ORAL
  Filled 2020-12-25: qty 2

## 2020-12-25 MED ORDER — ONDANSETRON 4 MG PO TBDP: 8.0000 mg | ORAL_TABLET | Freq: Three times a day (TID) | ORAL | Status: DC | PRN

## 2020-12-25 MED ORDER — SODIUM CHLORIDE 0.9 % IV SOLN: 500.0000 mg | INTRAVENOUS | Status: DC

## 2020-12-25 MED ORDER — GUAIFENESIN ER 600 MG PO TB12
600.0000 mg | ORAL_TABLET | Freq: Two times a day (BID) | ORAL | Status: DC
  Administered 2020-12-26 – 2020-12-27 (×3): 600 mg via ORAL
  Filled 2020-12-25 (×3): qty 1

## 2020-12-25 MED ORDER — SODIUM CHLORIDE 0.9 % IV SOLN
500.0000 mg | Freq: Once | INTRAVENOUS | Status: AC
  Administered 2020-12-25: 500 mg via INTRAVENOUS
  Filled 2020-12-25: qty 500

## 2020-12-25 MED ORDER — CEFTRIAXONE SODIUM 2 G IJ SOLR
2.0000 g | INTRAMUSCULAR | Status: AC
  Administered 2020-12-26 – 2020-12-30 (×5): 2 g via INTRAVENOUS
  Filled 2020-12-25 (×2): qty 20
  Filled 2020-12-25 (×3): qty 2

## 2020-12-25 MED ORDER — ONDANSETRON HCL 4 MG PO TABS: 4.0000 mg | ORAL_TABLET | Freq: Four times a day (QID) | ORAL | Status: DC | PRN

## 2020-12-25 MED ORDER — SODIUM CHLORIDE 0.9 % IV SOLN: Freq: Once | INTRAVENOUS | Status: AC

## 2020-12-25 MED ORDER — FAMOTIDINE 20 MG PO TABS
20.0000 mg | ORAL_TABLET | Freq: Every day | ORAL | Status: DC
  Administered 2020-12-26 – 2020-12-31 (×6): 20 mg via ORAL
  Filled 2020-12-25 (×6): qty 1

## 2020-12-25 MED ORDER — ACETAMINOPHEN 650 MG RE SUPP: 650.0000 mg | Freq: Four times a day (QID) | RECTAL | Status: DC | PRN

## 2020-12-25 MED ORDER — DM-GUAIFENESIN ER 30-600 MG PO TB12
1.0000 | ORAL_TABLET | Freq: Two times a day (BID) | ORAL | Status: DC
  Administered 2020-12-26 – 2020-12-27 (×4): 1 via ORAL
  Filled 2020-12-25 (×4): qty 1

## 2020-12-25 MED ORDER — ONDANSETRON HCL 4 MG/2ML IJ SOLN: 4.0000 mg | Freq: Four times a day (QID) | INTRAMUSCULAR | Status: DC | PRN

## 2020-12-25 MED ORDER — ASCORBIC ACID 500 MG PO TABS
250.0000 mg | ORAL_TABLET | Freq: Every day | ORAL | Status: DC
  Administered 2020-12-26 – 2020-12-31 (×6): 250 mg via ORAL
  Filled 2020-12-25 (×6): qty 1

## 2020-12-25 NOTE — H&P (Signed)
[place]   PATIENT NAME: Anita Benjamin    MR#:  [id number]  DATE OF BIRTH:  02-Nov-1933  DATE OF ADMISSION:  12/25/2020  PRIMARY CARE PHYSICIAN: Einar Pheasant, MD   Patient is coming from: Home  REQUESTING/REFERRING PHYSICIAN: Nance Pear, MD CHIEF COMPLAINT:   Chief Complaint  Patient presents with  . Shortness of Breath    X 2 days , sob    HISTORY OF PRESENT ILLNESS:  Anita Benjamin is a 87 y.o. Caucasian female with medical history significant for hypertension, dyslipidemia, atrial fibrillation, GERD and anemia, presented to the ER with acute onset of worsening dyspnea with associated cough initially productive of dark yellowish sputum and later with inability to expectorate.  Her symptoms have been going on for about a week with associated generalized weakness, fever and chills.  She had a T-max more than 102 today and her pulse oximetry dropped to the high 70s per her daughter who was with her in the ER.  She was seen in urgent care on Monday and reportedly diagnosed with positive RSV and she was negative for Covid and flu.  She was given medications for cough and nausea.  Symptoms have been getting worse.  She admitted to nausea today and had vomiting once.  No chest pain or palpitations.  No dysuria, oliguria or hematuria or flank pain.  No rhinorrhea or nasal congestion or sore throat or earache. ED Course: When she came to the ER, temperature was 100.9 with respiratory rate 28 with a pulse oximetry of 88-89% on room air and this was up to 92% on 3 L of O2 by nasal cannula and otherwise vital signs were within normal.  Labs revealed mild hyponatremia hypochloremia and CBC showed anemia 810 hematocrit 31.2 compared to 13.2/39.4 on 12/25/2020.  TSH was 3.13.  Influenza antigens and COVID-19 PCR came back negative. EKG as reviewed by me :showed sinus rhythm with rate of 89 with probable left atrial enlargement with RSR-on 2 of the inversion laterally and  anteroseptally. Imaging: Chest x-ray showed interval development of bilateral airspace disease, left greater than right with trace left pleural effusion, findings that could reflect asymmetric edema or multifocal pneumonia.  She was given IV Rocephin and Zithromax as well as normal saline 150 mL/h.  She will be admitted to a medical monitored bed for further evaluation and management. PAST MEDICAL HISTORY:   Past Medical History:  Diagnosis Date  . Anemia   . Atrial fibrillation ([place])   . Atrial flutter ([place])   . Fibrocystic breast disease   . GERD (gastroesophageal reflux disease)   . History of chicken pox   . Hypercholesterolemia   . Hypertension   History of breast cancer status post lumpectomy and sentinel lymph node biopsy, on Femara.  PAST SURGICAL HISTORY:   Past Surgical History:  Procedure Laterality Date  . BREAST BIOPSY Right 2014   NEG  . BREAST LUMPECTOMY WITH SENTINEL LYMPH NODE BIOPSY Left 06/04/2020   Procedure: BREAST LUMPECTOMY WITH SENTINEL LYMPH NODE BX;  Surgeon: Robert Bellow, MD;  Location: [facility] ORS;  Service: General;  Laterality: Left;  . CATARACT EXTRACTION    . DILATION AND CURETTAGE OF UTERUS    . Laser repair of torn retina      SOCIAL HISTORY:   Social History   Tobacco Use  . Smoking status: Never Smoker  . Smokeless tobacco: Never Used  Substance Use Topics  . Alcohol use: No    Alcohol/week: 0.0 standard  drinks    FAMILY HISTORY:   Family History  Problem Relation Age of Onset  . Heart disease Mother 66       Heart attack  . Cancer Father 65       lung cancer  . Breast cancer Paternal Aunt        x3  . Colon cancer Neg Hx     DRUG ALLERGIES:   Allergies  Allergen Reactions  . Oruvail [Ketoprofen]   . Relafen [Nabumetone]   . Timentin [Ticarcillin-Pot Clavulanate]     REVIEW OF SYSTEMS:   ROS As per history of present illness. All pertinent systems were reviewed above. Constitutional, HEENT, cardiovascular,  respiratory, GI, GU, musculoskeletal, neuro, psychiatric, endocrine, integumentary and hematologic systems were reviewed and are otherwise negative/unremarkable except for positive findings mentioned above in the HPI.   MEDICATIONS AT HOME:   Prior to Admission medications   Medication Sig Start Date End Date Taking? Authorizing Provider  acetaminophen (TYLENOL) 650 MG CR tablet Take 650 mg by mouth every 8 (eight) hours as needed for pain.    [provider]  amLODipine (NORVASC) 5 MG tablet TAKE 1 TABLET BY MOUTH ONCE DAILY AT [facility] TIME. CALL OUR OFFICE TO SCHEDULE APPOINTMENT FOR FURTHER REFILLS ON MEDICATIONS. 12/22/20   Minna Merritts, MD  apixaban (ELIQUIS) 2.5 MG TABS tablet Take 1 tablet (2.5 mg total) by mouth 2 (two) times daily. 06/07/20   Robert Bellow, MD  Ascorbic Acid (VITAMIN C) 250 MG CHEW Chew 250 mg by mouth daily.     [provider]  benzonatate (TESSALON) 100 MG capsule Take 2 capsules (200 mg total) by mouth every 8 (eight) hours. 12/22/20   Margarette Canada, NP  calcium citrate-vitamin D 500-500 MG-UNIT chewable tablet Chew 1 tablet by mouth daily.    [provider]  famotidine (PEPCID) 20 MG tablet Take 20 mg by mouth daily.    [provider]  losartan (COZAAR) 100 MG tablet TAKE 1 TABLET BY MOUTH ONCE DAILY 08/25/20   Einar Pheasant, MD  Multiple Vitamin (MULTIVITAMIN) tablet Take 1 tablet by mouth daily.    [provider]  ondansetron (ZOFRAN ODT) 8 MG disintegrating tablet Take 1 tablet (8 mg total) by mouth every 8 (eight) hours as needed for nausea or vomiting. 12/22/20   Margarette Canada, NP  promethazine-dextromethorphan (PROMETHAZINE-DM) 6.25-15 MG/5ML syrup Take 5 mLs by mouth 4 (four) times daily as needed. 12/22/20   Margarette Canada, NP  propranolol (INDERAL) 10 MG tablet Take 10 mg by mouth 3 (three) times daily as needed (heart rate). Take 1st for rapid heart rate. If no response after 1 hour take diltiazem    [provider]  sertraline (ZOLOFT) 50 MG tablet TAKE 1 TABLET BY MOUTH AT BEDTIME 11/24/20   Einar Pheasant, MD      VITAL SIGNS:  Blood pressure 121/74, pulse 65, temperature 98.6 F (37 C), temperature source Oral, resp. rate (!) 23, last menstrual period 10/27/1989, SpO2 90 %.  PHYSICAL EXAMINATION:  Physical Exam  GENERAL:  87 y.o.-year-old Caucasian female patient lying in the bed with no acute distress.  EYES: Pupils equal, round, reactive to light and accommodation. No scleral icterus. Extraocular muscles intact.  HEENT: Head atraumatic, normocephalic. Oropharynx and nasopharynx clear.  NECK:  Supple, no jugular venous distention. No thyroid enlargement, no tenderness.  LUNGS: Normal breath sounds bilaterally, no wheezing, rales,rhonchi or crepitation. No use of accessory muscles of respiration.  CARDIOVASCULAR: Regular rate and rhythm, S1,  S2 normal. No murmurs, rubs, or gallops.  ABDOMEN: Soft, nondistended, nontender. Bowel sounds present. No organomegaly or mass.  EXTREMITIES: No pedal edema, cyanosis, or clubbing.  NEUROLOGIC: Cranial nerves II through XII are intact. Muscle strength 5/5 in all extremities. Sensation intact. Gait not checked.  PSYCHIATRIC: The patient is alert and oriented x 3.  Normal affect and good eye contact. SKIN: No obvious rash, lesion, or ulcer.   LABORATORY PANEL:   CBC Recent Labs  Lab 12/25/20 2025  WBC 10.1  HGB 10.8*  HCT 31.2*  PLT 320   ------------------------------------------------------------------------------------------------------------------  Chemistries  Recent Labs  Lab 12/25/20 2025  NA 127*  K 4.3  CL 92*  CO2 25  GLUCOSE 126*  BUN 19  CREATININE 1.16*  CALCIUM 9.0   ------------------------------------------------------------------------------------------------------------------  Cardiac Enzymes No results for input(s): TROPONINI in the last 168  hours. ------------------------------------------------------------------------------------------------------------------  RADIOLOGY:  DG Chest Portable 1 View  Result Date: 12/25/2020 CLINICAL DATA:  Shortness of breath for 2 days EXAM: PORTABLE CHEST 1 VIEW COMPARISON:  12/22/2020 FINDINGS: Single frontal view of the chest demonstrates interval development of multifocal bilateral airspace disease, left greater than right. Trace left pleural effusion. No pneumothorax. Cardiac silhouette is unremarkable. No acute bony abnormalities. IMPRESSION: 1. Interval development of bilateral airspace disease, left greater than right, with trace left pleural effusion. Findings could reflect asymmetric edema or multifocal pneumonia. Electronically Signed   By: Randa Ngo M.D.   On: 12/25/2020 20:50      IMPRESSION AND PLAN:  Active Problems:   CAP (community acquired pneumonia)  1.  Community-acquired likely atypical pneumonia. -The patient will be admitted to a medically monitored bed. -We will continue antibiotic therapy with IV Rocephin and Zithromax. -We will follow blood cultures check sputum Gram stain culture and sensitivity as well as obtain urine pneumonia antigens. -Mucolytic therapy will be provided as well as scheduled and as needed duo nebs.  2.  Acute hypoxic respiratory failure secondary to #1. -O2 protocol will be followed. -Management otherwise as above.  3.  Anemia. -We will follow H&H and obtain anemia work-up including stool Hemoccult.  4.  Hyponatremialikely hypovolemic. -The patient will be hydrated with IV normal saline awe will follow BMP  .  5.  Essential hypertension. -We will continue amlodipine, Inderal and Cozaar.  6.  Depression. -We will continue Zoloft.  7.  History of breast cancer. -Continue Femara.  8.  Paroxysmal atrial fibrillation. -We will continue Eliquis.  DVT prophylaxis: We will continue Eliquis Code Status: full code. Family  Communication:  The plan of care was discussed in details with the patient (and family). I answered all questions. The patient agreed to proceed with the above mentioned plan. Further management will depend upon hospital course. Disposition Plan: Back to previous home environment Consults called: none. All the records are reviewed and case discussed with ED provider.  Status is: Inpatient  Remains inpatient appropriate because:Ongoing diagnostic testing needed not appropriate for outpatient work up, Unsafe d/c plan, IV treatments appropriate due to intensity of illness or inability to take PO and Inpatient level of care appropriate due to severity of illness   Dispo: The patient is from: Home              Anticipated d/c is to: Home              Patient currently is not medically stable to d/c.   Difficult to place patient No   TOTAL TIME TAKING CARE OF THIS PATIENT: 77  minutes.    Christel Mormon M.D on 12/25/2020 at 10:46 PM  [facility]   From 7 PM-7 AM, contact night-coverage [email or web address]  CC: Primary care physician; Einar Pheasant, MD

## 2020-12-25 NOTE — ED Provider Notes (Signed)
[facility] Emergency Department Provider Note   ____________________________________________   I have reviewed the triage vital signs and the nursing notes.   HISTORY  Chief Complaint Shortness of Breath (X 2 days , sob)   History limited by: Not Limited   HPI Anita Benjamin is a 87 y.o. female who presents to the emergency department today because of concern for because of concern for shortness of breath and hypoxia. The patient has been having some shortness of breath and cough for almost 1 week. Went to urgent care three days ago and was told that she had RSV. Since then has had continued worsening breathing and cough. Today family found her to be hypoxic. Patient denies any history of chronic lung disease.   Records reviewed. Per medical record review patient has a history of recent urgent care visit. Per record review tested negative for covid and influenza. Did not see RSV test.    Past Medical History:  Diagnosis Date  . Anemia   . Atrial fibrillation ([place])   . Atrial flutter ([place])   . Fibrocystic breast disease   . GERD (gastroesophageal reflux disease)   . History of chicken pox   . Hypercholesterolemia   . Hypertension     Patient Active Problem List   Diagnosis Date Noted  . Breast cancer ([place]) 06/09/2020  . Diarrhea 04/11/2019  . Hair loss 06/10/2017  . Fatigue 02/19/2017  . Chest pain 02/12/2017  . Encounter for anticoagulation discussion and counseling 10/05/2016  . Cough 10/01/2016  . Gas 10/27/2015  . Dysuria 09/17/2015  . Left hip pain 07/21/2015  . Neck pain 06/09/2015  . Syncope 03/25/2015  . Left wrist pain 03/25/2015  . SOB (shortness of breath) 12/01/2014  . Abdominal discomfort 12/01/2014  . Atrial flutter ([place]) 11/15/2014  . Stress 11/08/2014  . Difficulty sleeping 11/08/2014  . Health care maintenance 11/08/2014  . Adjustment disorder 10/14/2014  . Tachycardia 07/17/2014  . Hyperkalemia 07/17/2014  . History of  colonic polyps 03/09/2014  . Decreased sense of taste 03/09/2014  . Abnormal mammogram, unspecified 07/31/2013  . Abnormal mammogram 01/20/2013  . Essential hypertension 10/29/2012  . Hypercholesterolemia 10/29/2012  . Leukopenia 10/29/2012    Past Surgical History:  Procedure Laterality Date  . BREAST BIOPSY Right 2014   NEG  . BREAST LUMPECTOMY WITH SENTINEL LYMPH NODE BIOPSY Left 06/04/2020   Procedure: BREAST LUMPECTOMY WITH SENTINEL LYMPH NODE BX;  Surgeon: Robert Bellow, MD;  Location: [facility] ORS;  Service: General;  Laterality: Left;  . CATARACT EXTRACTION    . DILATION AND CURETTAGE OF UTERUS    . Laser repair of torn retina      Prior to Admission medications   Medication Sig Start Date End Date Taking? Authorizing Provider  acetaminophen (TYLENOL) 650 MG CR tablet Take 650 mg by mouth every 8 (eight) hours as needed for pain.    [provider]  amLODipine (NORVASC) 5 MG tablet TAKE 1 TABLET BY MOUTH ONCE DAILY AT [facility] TIME. CALL OUR OFFICE TO SCHEDULE APPOINTMENT FOR FURTHER REFILLS ON MEDICATIONS. 12/22/20   Minna Merritts, MD  apixaban (ELIQUIS) 2.5 MG TABS tablet Take 1 tablet (2.5 mg total) by mouth 2 (two) times daily. 06/07/20   Robert Bellow, MD  Ascorbic Acid (VITAMIN C) 250 MG CHEW Chew 250 mg by mouth daily.     [provider]  benzonatate (TESSALON) 100 MG capsule Take 2 capsules (200 mg total) by mouth every 8 (eight) hours. 12/22/20  Margarette Canada, NP  calcium citrate-vitamin D 500-500 MG-UNIT chewable tablet Chew 1 tablet by mouth daily.    [provider]  famotidine (PEPCID) 20 MG tablet Take 20 mg by mouth daily.    [provider]  losartan (COZAAR) 100 MG tablet TAKE 1 TABLET BY MOUTH ONCE DAILY 08/25/20   Einar Pheasant, MD  Multiple Vitamin (MULTIVITAMIN) tablet Take 1 tablet by mouth daily.    [provider]  ondansetron (ZOFRAN ODT) 8 MG disintegrating tablet Take 1 tablet (8 mg total) by mouth  every 8 (eight) hours as needed for nausea or vomiting. 12/22/20   Margarette Canada, NP  promethazine-dextromethorphan (PROMETHAZINE-DM) 6.25-15 MG/5ML syrup Take 5 mLs by mouth 4 (four) times daily as needed. 12/22/20   Margarette Canada, NP  propranolol (INDERAL) 10 MG tablet Take 10 mg by mouth 3 (three) times daily as needed (heart rate). Take 1st for rapid heart rate. If no response after 1 hour take diltiazem    [provider]  sertraline (ZOLOFT) 50 MG tablet TAKE 1 TABLET BY MOUTH AT BEDTIME 11/24/20   Einar Pheasant, MD    Allergies Oruvail [ketoprofen], Relafen [nabumetone], and Timentin [ticarcillin-pot clavulanate]  Family History  Problem Relation Age of Onset  . Heart disease Mother 57       Heart attack  . Cancer Father 74       lung cancer  . Breast cancer Paternal Aunt        x3  . Colon cancer Neg Hx     Social History Social History   Tobacco Use  . Smoking status: Never Smoker  . Smokeless tobacco: Never Used  Vaping Use  . Vaping Use: Never used  Substance Use Topics  . Alcohol use: No    Alcohol/week: 0.0 standard drinks  . Drug use: No    Review of Systems Constitutional: Positive for fevers.  Eyes: No visual changes. ENT: No sore throat. Cardiovascular: Denies chest pain. Respiratory: Positive for cough and shortness of breath. Gastrointestinal: No abdominal pain.  No nausea, no vomiting.  No diarrhea.   Genitourinary: Negative for dysuria. Musculoskeletal: Negative for back pain. Skin: Negative for rash. Neurological: Negative for headaches, focal weakness or numbness.  ____________________________________________   PHYSICAL EXAM:  VITAL SIGNS: ED Triage Vitals  Enc Vitals Group     BP 12/25/20 2028 122/64     Pulse Rate 12/25/20 2023 89     Resp 12/25/20 2023 17     Temp 12/25/20 2023 98.6 F (37 C)     Temp Source 12/25/20 2023 Oral     SpO2 12/25/20 2023 (!) 88 %     Weight --      Height --      Head Circumference --       Peak Flow --      Pain Score 12/25/20 2023 0   Constitutional: Alert and oriented.  Eyes: Conjunctivae are normal.  ENT      Head: Normocephalic and atraumatic.      Nose: No congestion/rhinnorhea.      Mouth/Throat: Mucous membranes are moist.      Neck: No stridor. Hematological/Lymphatic/Immunilogical: No cervical lymphadenopathy. Cardiovascular: Normal rate, regular rhythm.  No murmurs, rubs, or gallops.  Respiratory: Normal respiratory effort without tachypnea nor retractions. Diffuse rhonchi.  Gastrointestinal: Soft and non tender. No rebound. No guarding.  Genitourinary: Deferred Musculoskeletal: Normal range of motion in all extremities. No lower extremity edema. Neurologic:  Normal speech and language. No gross focal neurologic deficits  are appreciated.  Skin:  Skin is warm, dry and intact. No rash noted. Psychiatric: Mood and affect are normal. Speech and behavior are normal. Patient exhibits appropriate insight and judgment.  ____________________________________________    LABS (pertinent positives/negatives)  COVID negative Trop hs 16 BMP na 127, k 4.3, glu 126, cr 1.16 CBC wbc 10.1, hgb 10.8, plt 320  ____________________________________________   EKG  I, Nance Pear, attending physician, personally viewed and interpreted this EKG  EKG Time: 2027 Rate: 69 Rhythm: sinus rhythm Axis: normal Intervals: qtc 432 QRS: narrow, RSR' in v1, v2 ST changes: no st elevation Impression: abnormal ekg   ____________________________________________    RADIOLOGY  CXR Bilateral airspace disease  ____________________________________________   PROCEDURES  Procedures  ____________________________________________   INITIAL IMPRESSION / ASSESSMENT AND PLAN / ED COURSE  Pertinent labs & imaging results that were available during my care of the patient were reviewed by me and considered in my medical decision making (see chart for details).   Patient  presented to the emergency department today because of concerns for continued shortness of breath and hypoxia.  Patient was hypoxic here in the emergency department to the mid 80s.  Lung exam had diffuse rhonchi.  Chest x-ray was concerning for bilateral airspace disease.  At this time I do have concerns for pneumonia.  Patient was again Covid negative here.  Will start IV antibiotics.  Will plan on admission to the hospitalist service.  Discussed work-up and findings with patient.   ____________________________________________   FINAL CLINICAL IMPRESSION(S) / ED DIAGNOSES  Final diagnoses:  Community acquired pneumonia, unspecified laterality  Hypoxia     Note: This dictation was prepared with Sales executive. Any transcriptional errors that result from this process are unintentional     Nance Pear, MD 12/25/20 [phone]

## 2020-12-25 NOTE — ED Triage Notes (Signed)
X 2 days sob

## 2020-12-26 ENCOUNTER — Other Ambulatory Visit: Payer: Self-pay | Admitting: Internal Medicine

## 2020-12-26 DIAGNOSIS — J189 Pneumonia, unspecified organism: Secondary | ICD-10-CM | POA: Diagnosis not present

## 2020-12-26 LAB — CBC
HCT: 33 % — ABNORMAL LOW (ref 36.0–46.0)
Hemoglobin: 11.1 g/dL — ABNORMAL LOW (ref 12.0–15.0)
MCH: 31.2 pg (ref 26.0–34.0)
MCHC: 33.6 g/dL (ref 30.0–36.0)
MCV: 92.7 fL (ref 80.0–100.0)
Platelets: 342 10*3/uL (ref 150–400)
RBC: 3.56 MIL/uL — ABNORMAL LOW (ref 3.87–5.11)
RDW: 13.5 % (ref 11.5–15.5)
WBC: 9.5 10*3/uL (ref 4.0–10.5)
nRBC: 0 % (ref 0.0–0.2)

## 2020-12-26 LAB — RETICULOCYTES
Immature Retic Fract: 13.9 % (ref 2.3–15.9)
RBC.: 3.62 MIL/uL — ABNORMAL LOW (ref 3.87–5.11)
Retic Count, Absolute: 39.8 10*3/uL (ref 19.0–186.0)
Retic Ct Pct: 1.1 % (ref 0.4–3.1)

## 2020-12-26 LAB — FERRITIN: Ferritin: 336 ng/mL — ABNORMAL HIGH (ref 11–307)

## 2020-12-26 LAB — BLOOD CULTURE ID PANEL (REFLEXED) - BCID2

## 2020-12-26 LAB — BASIC METABOLIC PANEL
Anion gap: 11 (ref 5–15)
BUN: 17 mg/dL (ref 8–23)
CO2: 21 mmol/L — ABNORMAL LOW (ref 22–32)
Calcium: 8.9 mg/dL (ref 8.9–10.3)
Chloride: 100 mmol/L (ref 98–111)
Creatinine, Ser: 1 mg/dL (ref 0.44–1.00)
GFR, Estimated: 56 mL/min — ABNORMAL LOW (ref >60)
Glucose, Bld: 205 mg/dL — ABNORMAL HIGH (ref 70–99)
Potassium: 4 mmol/L (ref 3.5–5.1)
Sodium: 132 mmol/L — ABNORMAL LOW (ref 135–145)

## 2020-12-26 LAB — LACTIC ACID, PLASMA
Lactic Acid, Venous: 3.1 mmol/L (ref 0.5–1.9)
Lactic Acid, Venous: 2.8 mmol/L (ref 0.5–1.9)

## 2020-12-26 LAB — EXPECTORATED SPUTUM ASSESSMENT W GRAM STAIN, RFLX TO RESP C

## 2020-12-26 LAB — VITAMIN B12: Vitamin B-12: 2017 pg/mL — ABNORMAL HIGH (ref 180–914)

## 2020-12-26 LAB — IRON AND TIBC
Iron: 17 ug/dL — ABNORMAL LOW (ref 28–170)
Saturation Ratios: 9 % — ABNORMAL LOW (ref 10.4–31.8)
TIBC: 185 ug/dL — ABNORMAL LOW (ref 250–450)
UIBC: 168 ug/dL

## 2020-12-26 LAB — HEPATIC FUNCTION PANEL
ALT: 28 U/L (ref 0–44)
AST: 38 U/L (ref 15–41)
Albumin: 2.6 g/dL — ABNORMAL LOW (ref 3.5–5.0)
Alkaline Phosphatase: 73 U/L (ref 38–126)
Bilirubin, Direct: 0.1 mg/dL (ref 0.0–0.2)
Indirect Bilirubin: 0.5 mg/dL (ref 0.3–0.9)
Total Bilirubin: 0.6 mg/dL (ref 0.3–1.2)
Total Protein: 6.3 g/dL — ABNORMAL LOW (ref 6.5–8.1)

## 2020-12-26 LAB — LACTATE DEHYDROGENASE: LDH: 186 U/L (ref 98–192)

## 2020-12-26 LAB — FOLATE: Folate: 20.6 ng/mL (ref >5.9)

## 2020-12-26 LAB — STREP PNEUMONIAE URINARY ANTIGEN: Strep Pneumo Urinary Antigen: NEGATIVE

## 2020-12-26 MED ORDER — SODIUM CHLORIDE 0.9 % IV SOLN
500.0000 mg | INTRAVENOUS | Status: AC
  Administered 2020-12-26 – 2020-12-29 (×4): 500 mg via INTRAVENOUS
  Filled 2020-12-26 (×4): qty 500

## 2020-12-26 MED ORDER — SODIUM CHLORIDE 0.9 % IV BOLUS
1000.0000 mL | Freq: Once | INTRAVENOUS | Status: AC
  Administered 2020-12-26: 1000 mL via INTRAVENOUS

## 2020-12-26 NOTE — Progress Notes (Signed)
PROGRESS NOTE  Anita Benjamin DTO:[id number] DOB: Mar 22, 1934 DOA: 12/25/2020 PCP: Einar Pheasant, MD  HPI/Recap of past 24 hours: Anita Benjamin is a 87 y.o. 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  Her symptoms have been going on for about a week with associated generalized weakness, fever and chills.  She had a T-max more than 102 today and her pulse oximetry dropped to the high 70s per her daughter who was with her in the ER.  She was seen in urgent care on Monday and reportedly diagnosed with positive RSV and she was negative for Covid and flu.  She was given medications for cough and nausea.  Symptoms have been getting worse.  She admitted to nausea today and had vomiting once.  No chest pain or palpitations.  No dysuria, oliguria or hematuria or flank pain.  No rhinorrhea or nasal congestion or sore throat or earache.  ED Course: When she came to the ER, temperature was 100.9 with respiratory rate 28 with a pulse oximetry of 88-89% on room air and this was up to 92% on 3 L of O2 by nasal cannula and otherwise vital signs were within normal.  Labs revealed mild hyponatremia hypochloremia and CBC showed anemia 810 hematocrit 31.2 compared to 13.2/39.4 on 12/25/2020.  TSH was 3.13.  Influenza antigens and COVID-19 PCR came back negative. EKG as reviewed by me :showed sinus rhythm with rate of 89 with probable left atrial enlargement with RSR-on 2 of the inversion laterally and anteroseptally. Imaging: Chest x-ray showed interval development of bilateral airspace disease, left greater than right with trace left pleural effusion, findings that could reflect asymmetric edema or multifocal pneumonia.   12/26/20: Patient was seen and examined at  her bedside.  Reports persistent cough.  Dyspnea is improved at rest.  She is currently on 2 L nasal cannula.  Not on oxygen supplementation at baseline.  Assessment/Plan: Active Problems:   CAP (community acquired pneumonia)   Sepsis secondary to community-acquired pneumonia, likely atypical pneumonia, POA. She presented with fever with T-max 100.9, respiration rate 25, productive cough and evidence of pneumonia on chest x-ray. Recently diagnosed with RSV at urgent care. She was started on Rocephin and azithromycin empirically in the ED, continue. Continue pulmonary toilette. Continue to monitor WBC and fever curve.  Acute hypoxic respiratory failure secondary to above Not on oxygen supplementation at baseline Continue to maintain O2 saturation greater than 90%.  She is currently on 2 L nasal cannula. Wean off oxygen supplementation as tolerated.  Acute hypovolemic hyponatremia She presented with serum sodium of 127, improved with IV fluid hydration normal saline. Repeated serum sodium 132  Type 2 diabetes with hyperglycemia Obtain hemoglobin A1c S-won.  Chronic normocytic anemia Hemoglobin is currently 11, could be dilutional, previous hemoglobin 13 in 2021. No overt bleeding  Essential hypertension BP is currently at goal Continue home Norvasc 5 mg daily and losartan 100 mg daily.  Chronic anxiety/depression Stable Resume home Zoloft  Paroxysmal A. fib, on Eliquis She is currently rate controlled Continue home Norvasc 5 mg daily Continue home Eliquis  continue to monitor on telemetry  Generalized weakness/physical debility likely contributed by acute illness, sepsis. PT OT to assess Fall precautions   Code Status: Full code.  Family Communication: None at bedside.  Disposition Plan: Likely will  discharge to home when hemodynamically stable and oxygen requirement has  improved.   Consultants:  None.  Procedures:  None.  Antimicrobials:  Rocephin  Azithromycin  DVT prophylaxis:  Eliquis  Status is: Inpatient    Dispo:  Patient From: Home  Planned Disposition: Home  Anticipated date of discharge 12/28/2020.  Medically stable for discharge: No, ongoing management of sepsis secondary to community-acquired pneumonia, acute hypoxic respiratory failure.          Objective: Vitals:   12/26/20 0344 12/26/20 0951 12/26/20 1047 12/26/20 1104  BP: (!) 114/58 (!) 111/54 107/60   Pulse: 74 84 79   Resp: 18 18 18    Temp: 97.6 F (36.4 C) 98.4 F (36.9 C) 98 F (36.7 C)   TempSrc: Oral Oral Oral   SpO2: 91% 95% 96% 94%  Weight:      Height:        Intake/Output Summary (Last 24 hours) at 12/26/2020 1341 Last data filed at 12/26/2020 1333 Gross per 24 hour  Intake 950 ml  Output 800 ml  Net 150 ml   Filed Weights   12/26/20 0026  Weight: 59.5 kg    Exam:  . General: 87 y.o. year-old female frail-appearing in no acute distress..  Alert and oriented x3. . Cardiovascular: Regular rate and rhythm with no rubs or gallops.  No thyromegaly or JVD noted.   Marland Kitchen Respiratory: Diffuse rales bilaterally with no wheezing noted.  Good inspiratory effort.   . Abdomen: Soft nontender nondistended with normal bowel sounds x4 quadrants. . Musculoskeletal: No lower extremity edema. 2/4 pulses in all 4 extremities. . Skin: No ulcerative lesions noted or rashes, . Psychiatry: Mood is appropriate for condition and setting   Data Reviewed: CBC: Recent Labs  Lab 12/25/20 2025 12/26/20 0505  WBC 10.1 9.5  NEUTROABS 9.1*  --   HGB 10.8* 11.1*  HCT 31.2* 33.0*  MCV 91.5 92.7  PLT 320 476   Basic Metabolic Panel: Recent Labs  Lab 12/25/20 2025 12/26/20 0505  NA 127* 132*  K 4.3 4.0  CL 92* 100  CO2 25 21*  GLUCOSE 126* 205*  BUN 19 17  CREATININE 1.16* 1.00  CALCIUM 9.0 8.9   GFR: Estimated Creatinine Clearance: 39.3 mL/min (by  C-G formula based on SCr of 1 mg/dL). Liver Function Tests: Recent Labs  Lab 12/26/20 0505  AST 38  ALT 28  ALKPHOS 73  BILITOT 0.6  PROT 6.3*  ALBUMIN 2.6*   No results for input(s): LIPASE, AMYLASE in the last 168 hours. No results for input(s): AMMONIA in the last 168 hours. Coagulation Profile: No results for input(s): INR, PROTIME in the last 168 hours. Cardiac Enzymes: No results for input(s): CKTOTAL, CKMB, CKMBINDEX, TROPONINI in the last 168 hours. BNP (last 3 results) No results for input(s): PROBNP in the last 8760 hours. HbA1C: No results for input(s): HGBA1C in the last 72 hours. CBG: No results for input(s): GLUCAP in the last 168 hours. Lipid Profile: No results for input(s): CHOL, HDL, LDLCALC, TRIG, CHOLHDL, LDLDIRECT in the last 72 hours. Thyroid Function Tests: No results for input(s): TSH, T4TOTAL, FREET4, T3FREE, THYROIDAB in the last 72 hours. Anemia Panel: Recent Labs    12/25/20 2025 12/25/20 2305  VITAMINB12  --  2,017*  FOLATE 20.6  --   FERRITIN 336*  --   TIBC 185*  --   IRON 17*  --   RETICCTPCT  --  1.1   Urine analysis:    Component Value Date/Time  COLORURINE YELLOW (A) 06/27/2020 1658   APPEARANCEUR CLEAR (A) 06/27/2020 1658   LABSPEC 1.014 06/27/2020 1658   PHURINE 6.0 06/27/2020 1658   GLUCOSEU NEGATIVE 06/27/2020 1658   GLUCOSEU NEGATIVE 10/09/2014 1641   HGBUR NEGATIVE 06/27/2020 [place] 06/27/2020 1658   BILIRUBINUR neg 09/09/2015 [place] 06/27/2020 1658   PROTEINUR NEGATIVE 06/27/2020 1658   UROBILINOGEN 0.2 09/09/2015 1049   UROBILINOGEN 0.2 10/09/2014 1641   NITRITE NEGATIVE 06/27/2020 1658   LEUKOCYTESUR NEGATIVE 06/27/2020 1658   Sepsis Labs: @LABRCNTIP (procalcitonin:4,lacticidven:4)  ) Recent Results (from the past 240 hour(s))  Resp Panel by RT-PCR (Flu A&B, Covid) Nasopharyngeal Swab     Status: None   Collection Time: 12/22/20 11:11 AM   Specimen: Nasopharyngeal  Swab; Nasopharyngeal(NP) swabs in vial transport medium  Result Value Ref Range Status   SARS Coronavirus 2 by RT PCR NEGATIVE NEGATIVE Final    Comment: (NOTE) SARS-CoV-2 target nucleic acids are NOT DETECTED.  The SARS-CoV-2 RNA is generally detectable in upper respiratory specimens during the acute phase of infection. The lowest concentration of SARS-CoV-2 viral copies this assay can detect is 138 copies/mL. A negative result does not preclude SARS-Cov-2 infection and should not be used as the sole basis for treatment or other patient management decisions. A negative result may occur with  improper specimen collection/handling, submission of specimen other than nasopharyngeal swab, presence of viral mutation(s) within the areas targeted by this assay, and inadequate number of viral copies(<138 copies/mL). A negative result must be combined with clinical observations, patient history, and epidemiological information. The expected result is Negative.  Fact Sheet for Patients:  EntrepreneurPulse.com.au  Fact Sheet for Healthcare Providers:  IncredibleEmployment.be  This test is no t yet approved or cleared by the [place] FDA and  has been authorized for detection and/or diagnosis of SARS-CoV-2 by FDA under an Emergency Use Authorization (EUA). This EUA will remain  in effect (meaning this test can be used) for the duration of the COVID-19 declaration under Section 564(b)(1) of the Act, 21 U.S.C.section 360bbb-3(b)(1), unless the authorization is terminated  or revoked sooner.       Influenza A by PCR NEGATIVE NEGATIVE Final   Influenza B by PCR NEGATIVE NEGATIVE Final    Comment: (NOTE) The Xpert Xpress SARS-CoV-2/FLU/RSV plus assay is intended as an aid in the diagnosis of influenza from Nasopharyngeal swab specimens and should not be used as a sole basis for treatment. Nasal washings and aspirates are unacceptable for Xpert Xpress  SARS-CoV-2/FLU/RSV testing.  Fact Sheet for Patients: EntrepreneurPulse.com.au  Fact Sheet for Healthcare Providers: IncredibleEmployment.be  This test is not yet approved or cleared by the [place] FDA and has been authorized for detection and/or diagnosis of SARS-CoV-2 by FDA under an Emergency Use Authorization (EUA). This EUA will remain in effect (meaning this test can be used) for the duration of the COVID-19 declaration under Section 564(b)(1) of the Act, 21 U.S.C. section 360bbb-3(b)(1), unless the authorization is terminated or revoked.  Performed at [facility], [address] [place], [place] [address]   Blood culture (routine x 2)     Status: None (Preliminary result)   Collection Time: 12/25/20  8:25 PM   Specimen: BLOOD  Result Value Ref Range Status   Specimen Description BLOOD RIGHT ANTECUBITAL  Final   Special Requests   Final    BOTTLES DRAWN AEROBIC AND ANAEROBIC Blood Culture adequate volume   Culture   Final    NO  GROWTH < 12 HOURS Performed at [facility], [address] [place], [place] [address]    Report Status PENDING  Incomplete  Resp Panel by RT-PCR (Flu A&B, Covid) Nasopharyngeal Swab     Status: None   Collection Time: 12/25/20  9:14 PM   Specimen: Nasopharyngeal Swab; Nasopharyngeal(NP) swabs in vial transport medium  Result Value Ref Range Status   SARS Coronavirus 2 by RT PCR NEGATIVE NEGATIVE Final    Comment: (NOTE) SARS-CoV-2 target nucleic acids are NOT DETECTED.  The SARS-CoV-2 RNA is generally detectable in upper respiratory specimens during the acute phase of infection. The lowest concentration of SARS-CoV-2 viral copies this assay can detect is 138 copies/mL. A negative result does not preclude SARS-Cov-2 infection and should not be used as the sole basis for treatment or other patient management decisions. A negative result may occur with  improper specimen  collection/handling, submission of specimen other than nasopharyngeal swab, presence of viral mutation(s) within the areas targeted by this assay, and inadequate number of viral copies(<138 copies/mL). A negative result must be combined with clinical observations, patient history, and epidemiological information. The expected result is Negative.  Fact Sheet for Patients:  EntrepreneurPulse.com.au  Fact Sheet for Healthcare Providers:  IncredibleEmployment.be  This test is no t yet approved or cleared by the [place] FDA and  has been authorized for detection and/or diagnosis of SARS-CoV-2 by FDA under an Emergency Use Authorization (EUA). This EUA will remain  in effect (meaning this test can be used) for the duration of the COVID-19 declaration under Section 564(b)(1) of the Act, 21 U.S.C.section 360bbb-3(b)(1), unless the authorization is terminated  or revoked sooner.       Influenza A by PCR NEGATIVE NEGATIVE Final   Influenza B by PCR NEGATIVE NEGATIVE Final    Comment: (NOTE) The Xpert Xpress SARS-CoV-2/FLU/RSV plus assay is intended as an aid in the diagnosis of influenza from Nasopharyngeal swab specimens and should not be used as a sole basis for treatment. Nasal washings and aspirates are unacceptable for Xpert Xpress SARS-CoV-2/FLU/RSV testing.  Fact Sheet for Patients: EntrepreneurPulse.com.au  Fact Sheet for Healthcare Providers: IncredibleEmployment.be  This test is not yet approved or cleared by the [place] FDA and has been authorized for detection and/or diagnosis of SARS-CoV-2 by FDA under an Emergency Use Authorization (EUA). This EUA will remain in effect (meaning this test can be used) for the duration of the COVID-19 declaration under Section 564(b)(1) of the Act, 21 U.S.C. section 360bbb-3(b)(1), unless the authorization is terminated or revoked.  Performed at [facility], [address] [place], [place] [address]   Blood culture (routine x 2)     Status: None (Preliminary result)   Collection Time: 12/25/20 10:30 PM   Specimen: BLOOD  Result Value Ref Range Status   Specimen Description BLOOD LEFT ANTECUBITAL  Final   Special Requests   Final    BOTTLES DRAWN AEROBIC AND ANAEROBIC Blood Culture adequate volume   Culture   Final    NO GROWTH < 12 HOURS Performed at [facility], [address] [place], [place] [address]    Report Status PENDING  Incomplete  Culture, sputum-assessment     Status: None   Collection Time: 12/26/20  6:24 AM   Specimen: Sputum  Result Value Ref Range Status   Specimen Description SPUTUM  Final   Special Requests NONE  Final   Sputum evaluation   Final    THIS SPECIMEN IS ACCEPTABLE FOR SPUTUM  CULTURE Performed at [facility], [address] [place], [place] [address]    Report Status 12/26/2020 FINAL  Final      Studies: DG Chest Portable 1 View  Result Date: 12/25/2020 CLINICAL DATA:  Shortness of breath for 2 days EXAM: PORTABLE CHEST 1 VIEW COMPARISON:  12/22/2020 FINDINGS: Single frontal view of the chest demonstrates interval development of multifocal bilateral airspace disease, left greater than right. Trace left pleural effusion. No pneumothorax. Cardiac silhouette is unremarkable. No acute bony abnormalities. IMPRESSION: 1. Interval development of bilateral airspace disease, left greater than right, with trace left pleural effusion. Findings could reflect asymmetric edema or multifocal pneumonia. Electronically Signed   By: Randa Ngo M.D.   On: 12/25/2020 20:50    Scheduled Meds: . amLODipine  5 mg Oral Q supper  . apixaban  2.5 mg Oral BID  . ascorbic acid  250 mg Oral Daily  . benzonatate  200 mg Oral Q8H  . calcium citrate-vitamin D  1 tablet Oral Daily  . dextromethorphan-guaiFENesin  1 tablet Oral BID  . famotidine  20 mg Oral Daily  . guaiFENesin  600 mg  Oral BID  . ipratropium-albuterol  3 mL Nebulization QID  . losartan  100 mg Oral Daily  . multivitamin with minerals  1 tablet Oral Daily  . sertraline  50 mg Oral QHS    Continuous Infusions: . sodium chloride 125 mL/hr at 12/26/20 0800  . azithromycin    . cefTRIAXone (ROCEPHIN)  IV 2 g (12/26/20 1015)     LOS: 1 day     Kayleen Memos, MD [facility] Pager [phone]  If 7PM-7AM, please contact night-coverage [email or web address] Password [facility] 12/26/2020, 1:41 PM

## 2020-12-26 NOTE — Progress Notes (Signed)
PHARMACY - PHYSICIAN COMMUNICATION CRITICAL VALUE ALERT - BLOOD CULTURE IDENTIFICATION (BCID)  Anita Benjamin is an 87 y.o. female who presented to [facility] on 12/25/2020 with sepsis secondary to CAP, reportedly diagnosed with RSV on Monday.   Assessment: BCID with staph species (no organism identified) growing in 1/4 bottles, no resistance detected.   Name of physician (or Provider) Contacted: Dr. Nevada Crane  Current antibiotics: ceftriaxone 2 g IV q24h and azithromycin 500 mg IV q24h  Changes to prescribed antibiotics recommended:  Response not received from provider;  Possible contaminant.  No changes to current therapy recommended   Results for orders placed or performed during the hospital encounter of 12/25/20  Blood Culture ID Panel (Reflexed) (Collected: 12/25/2020 10:30 PM)  Result Value Ref Range   Enterococcus faecalis NOT DETECTED NOT DETECTED   Enterococcus Faecium NOT DETECTED NOT DETECTED   Listeria monocytogenes NOT DETECTED NOT DETECTED   Staphylococcus species DETECTED (A) NOT DETECTED   Staphylococcus aureus (BCID) NOT DETECTED NOT DETECTED   Staphylococcus epidermidis NOT DETECTED NOT DETECTED   Staphylococcus lugdunensis NOT DETECTED NOT DETECTED   Streptococcus species NOT DETECTED NOT DETECTED   Streptococcus agalactiae NOT DETECTED NOT DETECTED   Streptococcus pneumoniae NOT DETECTED NOT DETECTED   Streptococcus pyogenes NOT DETECTED NOT DETECTED   A.calcoaceticus-baumannii NOT DETECTED NOT DETECTED   Bacteroides fragilis NOT DETECTED NOT DETECTED   Enterobacterales NOT DETECTED NOT DETECTED   Enterobacter cloacae complex NOT DETECTED NOT DETECTED   Escherichia coli NOT DETECTED NOT DETECTED   Klebsiella aerogenes NOT DETECTED NOT DETECTED   Klebsiella oxytoca NOT DETECTED NOT DETECTED   Klebsiella pneumoniae NOT DETECTED NOT DETECTED   Proteus species NOT DETECTED NOT DETECTED   Salmonella species NOT DETECTED NOT DETECTED   Serratia marcescens NOT DETECTED NOT  DETECTED   Haemophilus influenzae NOT DETECTED NOT DETECTED   Neisseria meningitidis NOT DETECTED NOT DETECTED   Pseudomonas aeruginosa NOT DETECTED NOT DETECTED   Stenotrophomonas maltophilia NOT DETECTED NOT DETECTED   Candida albicans NOT DETECTED NOT DETECTED   Candida auris NOT DETECTED NOT DETECTED   Candida glabrata NOT DETECTED NOT DETECTED   Candida krusei NOT DETECTED NOT DETECTED   Candida parapsilosis NOT DETECTED NOT DETECTED   Candida tropicalis NOT DETECTED NOT DETECTED   Cryptococcus neoformans/gattii NOT DETECTED NOT DETECTED    Darnelle Bos 12/26/2020  8:31 PM

## 2020-12-27 DIAGNOSIS — J189 Pneumonia, unspecified organism: Secondary | ICD-10-CM | POA: Diagnosis not present

## 2020-12-27 LAB — BASIC METABOLIC PANEL
Anion gap: 6 (ref 5–15)
BUN: 17 mg/dL (ref 8–23)
CO2: 24 mmol/L (ref 22–32)
Calcium: 8.9 mg/dL (ref 8.9–10.3)
Chloride: 107 mmol/L (ref 98–111)
Creatinine, Ser: 0.71 mg/dL (ref 0.44–1.00)
GFR, Estimated: >60 mL/min (ref >60)
Glucose, Bld: 125 mg/dL — ABNORMAL HIGH (ref 70–99)
Potassium: 4.5 mmol/L (ref 3.5–5.1)
Sodium: 137 mmol/L (ref 135–145)

## 2020-12-27 LAB — CBC
HCT: 29.2 % — ABNORMAL LOW (ref 36.0–46.0)
Hemoglobin: 10 g/dL — ABNORMAL LOW (ref 12.0–15.0)
MCH: 31.6 pg (ref 26.0–34.0)
MCHC: 34.2 g/dL (ref 30.0–36.0)
MCV: 92.4 fL (ref 80.0–100.0)
Platelets: 367 10*3/uL (ref 150–400)
RBC: 3.16 MIL/uL — ABNORMAL LOW (ref 3.87–5.11)
RDW: 13.9 % (ref 11.5–15.5)
WBC: 13.8 10*3/uL — ABNORMAL HIGH (ref 4.0–10.5)
nRBC: 0 % (ref 0.0–0.2)

## 2020-12-27 LAB — LEGIONELLA PNEUMOPHILA SEROGP 1 UR AG: L. pneumophila Serogp 1 Ur Ag: NEGATIVE

## 2020-12-27 LAB — GLUCOSE, CAPILLARY
Glucose-Capillary: 113 mg/dL — ABNORMAL HIGH (ref 70–99)
Glucose-Capillary: 105 mg/dL — ABNORMAL HIGH (ref 70–99)

## 2020-12-27 LAB — MAGNESIUM: Magnesium: 2.1 mg/dL (ref 1.7–2.4)

## 2020-12-27 LAB — PROCALCITONIN: Procalcitonin: 0.68 ng/mL

## 2020-12-27 LAB — PHOSPHORUS: Phosphorus: 2.8 mg/dL (ref 2.5–4.6)

## 2020-12-27 MED ORDER — FERROUS SULFATE 325 (65 FE) MG PO TABS
325.0000 mg | ORAL_TABLET | Freq: Every day | ORAL | Status: DC
  Administered 2020-12-28 – 2020-12-31 (×4): 325 mg via ORAL
  Filled 2020-12-27 (×4): qty 1

## 2020-12-27 MED ORDER — IPRATROPIUM-ALBUTEROL 0.5-2.5 (3) MG/3ML IN SOLN
3.0000 mL | Freq: Three times a day (TID) | RESPIRATORY_TRACT | Status: DC
  Administered 2020-12-27 – 2020-12-30 (×9): 3 mL via RESPIRATORY_TRACT
  Filled 2020-12-27 (×9): qty 3

## 2020-12-27 MED ORDER — GUAIFENESIN ER 600 MG PO TB12
1200.0000 mg | ORAL_TABLET | Freq: Two times a day (BID) | ORAL | Status: AC
  Administered 2020-12-27 – 2020-12-30 (×6): 1200 mg via ORAL
  Filled 2020-12-27 (×7): qty 2

## 2020-12-27 MED ORDER — INSULIN ASPART 100 UNIT/ML ~~LOC~~ SOLN: 0.0000 [IU] | Freq: Three times a day (TID) | SUBCUTANEOUS | Status: DC

## 2020-12-27 MED ORDER — SODIUM CHLORIDE 3 % IN NEBU
4.0000 mL | INHALATION_SOLUTION | Freq: Two times a day (BID) | RESPIRATORY_TRACT | Status: DC
  Administered 2020-12-27: 4 mL via RESPIRATORY_TRACT
  Filled 2020-12-27 (×2): qty 4

## 2020-12-27 NOTE — Plan of Care (Signed)
Continuing with plan of care. 

## 2020-12-27 NOTE — Progress Notes (Signed)
PROGRESS NOTE  Anita Benjamin DUK:[id number] DOB: 08-Jan-1934 DOA: 12/25/2020 PCP: Einar Pheasant, MD  HPI/Recap of past 24 hours: Anita Benjamin is a 87 y.o. 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  Her symptoms have been going on for about a week with associated generalized weakness, fever and chills.  She had a T-max more than 102 today and her pulse oximetry dropped to the high 70s per her daughter who was with her in the ER.  She was seen in urgent care on Monday and reportedly diagnosed with positive RSV and she was negative for Covid and flu.  She was given medications for cough and nausea.  Symptoms have been getting worse.    She admitted to nausea and vomiting on the day of presentation.    Work-up revealed multifocal pneumonia for which she was started on Rocephin and azithromycin empirically.  Positive blood culture drawn on 12/25/2020, gram-positive cocci which is thought to be contaminant.  Sputum culture 12/26/20 grew moderate Streptococcus pneumoniae  Assessment/Plan: Active Problems:   CAP (community acquired pneumonia)   Sepsis secondary to streptococcus pneumoniae pneumonia, POA. She presented with fever with T-max 100.9, respiration rate 25, productive cough and evidence of pneumonia on chest x-ray. Recently diagnosed with RSV at urgent care. Sputum culture 12/26/20 grew moderate Streptococcus pneumoniae She was started on Rocephin and azithromycin empirically in the ED, continue. Start pulmonary toilette. Continue to monitor WBC and fever curve.  Acute hypoxic respiratory failure secondary to above Not on oxygen supplementation at baseline Continue to maintain O2 saturation greater than 90%.  She is currently  on 2 L nasal cannula. Continue to Wean off oxygen supplementation as tolerated.  Resolved Acute hypovolemic hyponatremia She presented with serum sodium of 127, improved with IV fluid hydration normal saline. Repeated serum sodium 137  Type 2 diabetes with hyperglycemia Obtain hemoglobin A1c Start ISS  Iron deficiency anemia/Chronic normocytic anemia Hemoglobin is currently 10 Iron studies suggestive of iron deficiency. No overt bleeding Start ferrous sulfate  Essential hypertension BP is currently at goal Continue home Norvasc 5 mg daily and losartan 100 mg daily.  Chronic anxiety/depression Stable Continue home Zoloft  Paroxysmal A. fib, on Eliquis She is currently rate controlled Continue home Norvasc 5 mg daily Continue home Eliquis  continue to monitor on telemetry  Generalized weakness/physical debility likely contributed by acute illness, sepsis. PT OT recommended home PT OT Continue Fall precautions   Code Status: Full code.  Family Communication: updated daughter via phone  Disposition Plan: Likely will discharge to home when hemodynamically stable and oxygen requirement has improved.   Consultants:  None.  Procedures:  None.  Antimicrobials:  Rocephin  Azithromycin  DVT prophylaxis:  Eliquis  Status is: Inpatient    Dispo:  Patient From: Home  Planned Disposition: Home  Anticipated date of discharge 12/28/2020.  Medically stable for discharge: No, ongoing management of sepsis secondary to community-acquired pneumonia, acute hypoxic respiratory failure.          Objective: Vitals:   12/26/20 2033 12/26/20 2318 12/27/20 0352 12/27/20 1110  BP: (!) 95/59 127/60 (!) 135/58 (!) 146/70  Pulse: 79 78 78 76  Resp: 17 20 18 20   Temp: 97.6 F (36.4 C)  98.1 F (36.7 C) 97.7 F (36.5 C)  TempSrc: Oral   Oral  SpO2: 94% 95% 91% 95%  Weight:      Height:        Intake/Output Summary (Last 24 hours) at 12/27/2020 1516 Last data  filed at 12/27/2020 1310 Gross per 24 hour  Intake 2449.25 ml  Output 800 ml  Net 1649.25 ml   Filed Weights   12/26/20 0026  Weight: 59.5 kg    Exam:  . General: 87 y.o. year-old female frail-appearing no acute distress.  She is alert and oriented x3.   . Cardiovascular: Regular rate and rhythm no rubs or gallops.   Marland Kitchen Respiratory: Mild rales at bases no wheezing noted.   . Abdomen: Soft nontender normal bowel sounds present.   . Musculoskeletal: No lower extremity edema bilaterally. . Skin: No ulcerative lesions noted.   Marland Kitchen Psychiatry: Mood is appropriate for condition and setting.   Data Reviewed: CBC: Recent Labs  Lab 12/25/20 2025 12/26/20 0505 12/27/20 0530  WBC 10.1 9.5 13.8*  NEUTROABS 9.1*  --   --   HGB 10.8* 11.1* 10.0*  HCT 31.2* 33.0* 29.2*  MCV 91.5 92.7 92.4  PLT 320 342 846   Basic Metabolic Panel: Recent Labs  Lab 12/25/20 2025 12/26/20 0505 12/27/20 0530  NA 127* 132* 137  K 4.3 4.0 4.5  CL 92* 100 107  CO2 25 21* 24  GLUCOSE 126* 205* 125*  BUN 19 17 17   CREATININE 1.16* 1.00 0.71  CALCIUM 9.0 8.9 8.9  MG  --   --  2.1  PHOS  --   --  2.8   GFR: Estimated Creatinine Clearance: 49.2 mL/min (by C-G formula based on SCr of 0.71 mg/dL). Liver Function Tests: Recent Labs  Lab 12/26/20 0505  AST 38  ALT 28  ALKPHOS 73  BILITOT 0.6  PROT 6.3*  ALBUMIN 2.6*   No results for input(s): LIPASE, AMYLASE in the last 168 hours. No results for input(s): AMMONIA in the last 168 hours. Coagulation Profile: No results for input(s): INR, PROTIME in the last 168 hours. Cardiac Enzymes: No results for input(s): CKTOTAL, CKMB, CKMBINDEX, TROPONINI in the last 168 hours. BNP (last 3 results) No results for input(s): PROBNP in the last 8760 hours. HbA1C: No results for input(s): HGBA1C in the last 72 hours. CBG: No results for input(s): GLUCAP in the last 168 hours. Lipid Profile: No results for input(s): CHOL, HDL, LDLCALC, TRIG, CHOLHDL,  LDLDIRECT in the last 72 hours. Thyroid Function Tests: No results for input(s): TSH, T4TOTAL, FREET4, T3FREE, THYROIDAB in the last 72 hours. Anemia Panel: Recent Labs    12/25/20 2025 12/25/20 2305  VITAMINB12  --  2,017*  FOLATE 20.6  --   FERRITIN 336*  --   TIBC 185*  --   IRON 17*  --   RETICCTPCT  --  1.1   Urine analysis:    Component Value Date/Time   COLORURINE YELLOW (A) 06/27/2020 1658   APPEARANCEUR CLEAR (A) 06/27/2020 1658   LABSPEC 1.014 06/27/2020 1658   PHURINE 6.0 06/27/2020 1658   GLUCOSEU NEGATIVE 06/27/2020 1658   GLUCOSEU NEGATIVE 10/09/2014 1641   HGBUR NEGATIVE 06/27/2020 1658   BILIRUBINUR NEGATIVE 06/27/2020 1658   BILIRUBINUR neg 09/09/2015 1049   KETONESUR NEGATIVE 06/27/2020 1658   PROTEINUR NEGATIVE 06/27/2020 1658   UROBILINOGEN 0.2 09/09/2015 1049   UROBILINOGEN 0.2 10/09/2014 1641   NITRITE NEGATIVE 06/27/2020 1658   LEUKOCYTESUR NEGATIVE 06/27/2020 1658   Sepsis Labs: @LABRCNTIP (procalcitonin:4,lacticidven:4)  ) Recent Results (from the past 240 hour(s))  Resp Panel by RT-PCR (  Flu A&B, Covid) Nasopharyngeal Swab     Status: None   Collection Time: 12/22/20 11:11 AM   Specimen: Nasopharyngeal Swab; Nasopharyngeal(NP) swabs in vial transport medium  Result Value Ref Range Status   SARS Coronavirus 2 by RT PCR NEGATIVE NEGATIVE Final    Comment: (NOTE) SARS-CoV-2 target nucleic acids are NOT DETECTED.  The SARS-CoV-2 RNA is generally detectable in upper respiratory specimens during the acute phase of infection. The lowest concentration of SARS-CoV-2 viral copies this assay can detect is 138 copies/mL. A negative result does not preclude SARS-Cov-2 infection and should not be used as the sole basis for treatment or other patient management decisions. A negative result may occur with  improper specimen collection/handling, submission of specimen other than nasopharyngeal swab, presence of viral mutation(s) within the areas targeted  by this assay, and inadequate number of viral copies(<138 copies/mL). A negative result must be combined with clinical observations, patient history, and epidemiological information. The expected result is Negative.  Fact Sheet for Patients:  EntrepreneurPulse.com.au  Fact Sheet for Healthcare Providers:  IncredibleEmployment.be  This test is no t yet approved or cleared by the [place] FDA and  has been authorized for detection and/or diagnosis of SARS-CoV-2 by FDA under an Emergency Use Authorization (EUA). This EUA will remain  in effect (meaning this test can be used) for the duration of the COVID-19 declaration under Section 564(b)(1) of the Act, 21 U.S.C.section 360bbb-3(b)(1), unless the authorization is terminated  or revoked sooner.       Influenza A by PCR NEGATIVE NEGATIVE Final   Influenza B by PCR NEGATIVE NEGATIVE Final    Comment: (NOTE) The Xpert Xpress SARS-CoV-2/FLU/RSV plus assay is intended as an aid in the diagnosis of influenza from Nasopharyngeal swab specimens and should not be used as a sole basis for treatment. Nasal washings and aspirates are unacceptable for Xpert Xpress SARS-CoV-2/FLU/RSV testing.  Fact Sheet for Patients: EntrepreneurPulse.com.au  Fact Sheet for Healthcare Providers: IncredibleEmployment.be  This test is not yet approved or cleared by the [place] FDA and has been authorized for detection and/or diagnosis of SARS-CoV-2 by FDA under an Emergency Use Authorization (EUA). This EUA will remain in effect (meaning this test can be used) for the duration of the COVID-19 declaration under Section 564(b)(1) of the Act, 21 U.S.C. section 360bbb-3(b)(1), unless the authorization is terminated or revoked.  Performed at [facility], [address] [place], [place] [address]   Blood culture (routine x 2)     Status: None (Preliminary result)    Collection Time: 12/25/20  8:25 PM   Specimen: BLOOD  Result Value Ref Range Status   Specimen Description BLOOD RIGHT ANTECUBITAL  Final   Special Requests   Final    BOTTLES DRAWN AEROBIC AND ANAEROBIC Blood Culture adequate volume   Culture   Final    NO GROWTH 2 DAYS Performed at [facility], [address], [place], [place] [address]    Report Status PENDING  Incomplete  Resp Panel by RT-PCR (Flu A&B, Covid) Nasopharyngeal Swab     Status: None   Collection Time: 12/25/20  9:14 PM   Specimen: Nasopharyngeal Swab; Nasopharyngeal(NP) swabs in vial transport medium  Result Value Ref Range Status   SARS Coronavirus 2 by RT PCR NEGATIVE NEGATIVE Final    Comment: (NOTE) SARS-CoV-2 target nucleic acids are NOT DETECTED.  The SARS-CoV-2 RNA is generally detectable in upper respiratory specimens during the acute phase of infection. The lowest concentration of SARS-CoV-2  viral copies this assay can detect is 138 copies/mL. A negative result does not preclude SARS-Cov-2 infection and should not be used as the sole basis for treatment or other patient management decisions. A negative result may occur with  improper specimen collection/handling, submission of specimen other than nasopharyngeal swab, presence of viral mutation(s) within the areas targeted by this assay, and inadequate number of viral copies(<138 copies/mL). A negative result must be combined with clinical observations, patient history, and epidemiological information. The expected result is Negative.  Fact Sheet for Patients:  EntrepreneurPulse.com.au  Fact Sheet for Healthcare Providers:  IncredibleEmployment.be  This test is no t yet approved or cleared by the [place] FDA and  has been authorized for detection and/or diagnosis of SARS-CoV-2 by FDA under an Emergency Use Authorization (EUA). This EUA will remain  in effect (meaning this test can be used) for the  duration of the COVID-19 declaration under Section 564(b)(1) of the Act, 21 U.S.C.section 360bbb-3(b)(1), unless the authorization is terminated  or revoked sooner.       Influenza A by PCR NEGATIVE NEGATIVE Final   Influenza B by PCR NEGATIVE NEGATIVE Final    Comment: (NOTE) The Xpert Xpress SARS-CoV-2/FLU/RSV plus assay is intended as an aid in the diagnosis of influenza from Nasopharyngeal swab specimens and should not be used as a sole basis for treatment. Nasal washings and aspirates are unacceptable for Xpert Xpress SARS-CoV-2/FLU/RSV testing.  Fact Sheet for Patients: EntrepreneurPulse.com.au  Fact Sheet for Healthcare Providers: IncredibleEmployment.be  This test is not yet approved or cleared by the [place] FDA and has been authorized for detection and/or diagnosis of SARS-CoV-2 by FDA under an Emergency Use Authorization (EUA). This EUA will remain in effect (meaning this test can be used) for the duration of the COVID-19 declaration under Section 564(b)(1) of the Act, 21 U.S.C. section 360bbb-3(b)(1), unless the authorization is terminated or revoked.  Performed at [facility], [address] [place], [place] [address]   Blood culture (routine x 2)     Status: None (Preliminary result)   Collection Time: 12/25/20 10:30 PM   Specimen: BLOOD  Result Value Ref Range Status   Specimen Description   Final    BLOOD LEFT ANTECUBITAL Performed at [facility], [address] [place], [place] [address]    Special Requests   Final    BOTTLES DRAWN AEROBIC AND ANAEROBIC Blood Culture adequate volume Performed at [facility], [address], [place], [place] [address]    Culture  Setup Time   Final    GRAM POSITIVE COCCI AEROBIC BOTTLE ONLY CRITICAL RESULT CALLED TO, READ BACK BY AND VERIFIED WITH: MORGAN HICKS AT 9323 ON 12/26/20 BY SS Performed at [facility], [place] [address]  [place], [place] [address]    Culture GRAM POSITIVE COCCI  Final   Report Status PENDING  Incomplete  Blood Culture ID Panel (Reflexed)     Status: Abnormal   Collection Time: 12/25/20 10:30 PM  Result Value Ref Range Status   Enterococcus faecalis NOT DETECTED NOT DETECTED Final   Enterococcus Faecium NOT DETECTED NOT DETECTED Final   Listeria monocytogenes NOT DETECTED NOT DETECTED Final   Staphylococcus species DETECTED (A) NOT DETECTED Final    Comment: CRITICAL RESULT CALLED TO, READ BACK BY AND VERIFIED WITH: MORGAN HICKS AT 1844 ON 12/26/20 BY SS    Staphylococcus aureus (BCID) NOT DETECTED NOT DETECTED Final   Staphylococcus epidermidis NOT DETECTED NOT DETECTED Final   Staphylococcus  lugdunensis NOT DETECTED NOT DETECTED Final   Streptococcus species NOT DETECTED NOT DETECTED Final   Streptococcus agalactiae NOT DETECTED NOT DETECTED Final   Streptococcus pneumoniae NOT DETECTED NOT DETECTED Final   Streptococcus pyogenes NOT DETECTED NOT DETECTED Final   A.calcoaceticus-baumannii NOT DETECTED NOT DETECTED Final   Bacteroides fragilis NOT DETECTED NOT DETECTED Final   Enterobacterales NOT DETECTED NOT DETECTED Final   Enterobacter cloacae complex NOT DETECTED NOT DETECTED Final   Escherichia coli NOT DETECTED NOT DETECTED Final   Klebsiella aerogenes NOT DETECTED NOT DETECTED Final   Klebsiella oxytoca NOT DETECTED NOT DETECTED Final   Klebsiella pneumoniae NOT DETECTED NOT DETECTED Final   Proteus species NOT DETECTED NOT DETECTED Final   Salmonella species NOT DETECTED NOT DETECTED Final   Serratia marcescens NOT DETECTED NOT DETECTED Final   Haemophilus influenzae NOT DETECTED NOT DETECTED Final   Neisseria meningitidis NOT DETECTED NOT DETECTED Final   Pseudomonas aeruginosa NOT DETECTED NOT DETECTED Final   Stenotrophomonas maltophilia NOT DETECTED NOT DETECTED Final   Candida albicans NOT DETECTED NOT DETECTED Final   Candida auris NOT DETECTED NOT DETECTED Final    Candida glabrata NOT DETECTED NOT DETECTED Final   Candida krusei NOT DETECTED NOT DETECTED Final   Candida parapsilosis NOT DETECTED NOT DETECTED Final   Candida tropicalis NOT DETECTED NOT DETECTED Final   Cryptococcus neoformans/gattii NOT DETECTED NOT DETECTED Final    Comment: Performed at [facility], [address] [place], [place] [address]  Culture, sputum-assessment     Status: None   Collection Time: 12/26/20  6:24 AM   Specimen: Sputum  Result Value Ref Range Status   Specimen Description SPUTUM  Final   Special Requests NONE  Final   Sputum evaluation   Final    THIS SPECIMEN IS ACCEPTABLE FOR SPUTUM CULTURE Performed at [facility], [address] [place], [place] [address]    Report Status 12/26/2020 FINAL  Final  Culture, Respiratory w Gram Stain     Status: None (Preliminary result)   Collection Time: 12/26/20  6:24 AM   Specimen: SPU  Result Value Ref Range Status   Specimen Description   Final    SPUTUM Performed at [facility], [address] [place], [place] [address]    Special Requests   Final    NONE Reflexed from [id number] Performed at [facility], [address] [place], [place] [address]    Gram Stain   Final    MODERATE WBC PRESENT,BOTH PMN AND MONONUCLEAR MODERATE GRAM POSITIVE COCCI MODERATE GRAM VARIABLE ROD    Culture   Final    MODERATE STREPTOCOCCUS PNEUMONIAE CULTURE REINCUBATED FOR BETTER GROWTH Performed at [facility], [place] [address] [place], [place] [address]    Report Status PENDING  Incomplete      Studies: No results found.  Scheduled Meds: . amLODipine  5 mg Oral Q supper  . apixaban  2.5 mg Oral BID  . ascorbic acid  250 mg Oral Daily  . benzonatate  200 mg Oral Q8H  . calcium citrate-vitamin D  1 tablet Oral Daily  . famotidine  20 mg Oral Daily  . guaiFENesin  1,200 mg Oral BID  . ipratropium-albuterol  3 mL Nebulization TID  . losartan  100 mg Oral Daily  .  multivitamin with minerals  1 tablet Oral Daily  . sertraline  50 mg Oral QHS  . sodium chloride HYPERTONIC  4 mL Nebulization BID  Continuous Infusions: . sodium chloride Stopped (12/27/20 1153)  . azithromycin Stopped (12/27/20 0024)  . cefTRIAXone (ROCEPHIN)  IV 200 mL/hr at 12/27/20 1310     LOS: 2 days     Kayleen Memos, MD [facility] Pager [phone]  If 7PM-7AM, please contact night-coverage [email or web address] Password [facility] 12/27/2020, 3:16 PM

## 2020-12-27 NOTE — Evaluation (Signed)
Physical Therapy Evaluation Patient Details Name: Anita Benjamin MRN: [id number] DOB: 08-18-1934 Today's Date: 12/27/2020   History of Present Illness  87 y.o. Caucasian female with medical history significant for hypertension, dyslipidemia, atrial fibrillation, GERD and anemia, presented to the ER with acute onset of worsening dyspnea with associated cough initially productive of dark yellowish sputum.  Clinical Impression  Pt has not needed O2 in the past, but required 2L t/o the session to maintain stats >88%.  She was not overly short of breath during prolonged (200 ft) of ambulation but she did have some fatigue and needed an AD (normally up ad lib w/o AD).  She was safe with mobility and though she is not at her baseline function she would be able to safely manage at home with assist from daughter (who is home 24/7 M-Th, works weekends). Pt's O2 dropped <88% quickly on room air in supine.  Follow Up Recommendations Home health PT    Equipment Recommendations  None recommended by PT    Recommendations for Other Services       Precautions / Restrictions Precautions Precautions: None Restrictions Weight Bearing Restrictions: No      Mobility  Bed Mobility Overal bed mobility: Independent                  Transfers Overall transfer level: Modified independent Equipment used: Rolling walker (2 wheeled)             General transfer comment: Pt able to rise without assist, but did need RW for stability (no ADs at basleine)  Ambulation/Gait Ambulation/Gait assistance: Min guard Gait Distance (Feet): 200 Feet Assistive device: Rolling walker (2 wheeled)       General Gait Details: On 2L O2 t/o the effort, sats generally stayed in the 88-93% range.  She was able to maintain safe and consistent cadence with FWW (did not feel confident enough to try HHA or no AD this date).  Pt with some fatigue that was not excessive during the effort.  Stairs             Wheelchair Mobility    Modified Rankin (Stroke Patients Only)       Balance Overall balance assessment: Modified Independent                                           Pertinent Vitals/Pain Pain Assessment: No/denies pain    Home Living Family/patient expects to be discharged to:: Private residence Living Arrangements: Children Available Help at Discharge:  (daughter works F,S,S variable hours)   Home Access: Stairs to enter Entrance Stairs-Rails: Right Entrance Stairs-Number of Steps: 3   Home Equipment: Environmental consultant - 2 wheels;Cane - single point      Prior Function Level of Independence: Independent         Comments: Pt drives, runs errands, does not typically need AD     Hand Dominance        Extremity/Trunk Assessment   Upper Extremity Assessment Upper Extremity Assessment: Overall WFL for tasks assessed    Lower Extremity Assessment Lower Extremity Assessment: Overall WFL for tasks assessed       Communication   Communication: No difficulties  Cognition Arousal/Alertness: Awake/alert Behavior During Therapy: WFL for tasks assessed/performed Overall Cognitive Status: Within Functional Limits for tasks assessed  General Comments General comments (skin integrity, edema, etc.): Pt's O2 in the low 90s on 2L on arriva, quickly dropped <88% on room air while still in supine w/o much activity at all - reapplied O2.    Exercises     Assessment/Plan    PT Assessment Patient needs continued PT services  PT Problem List Decreased strength;Decreased activity tolerance;Decreased range of motion;Decreased balance;Decreased mobility;Decreased safety awareness;Decreased knowledge of use of DME;Pain;Cardiopulmonary status limiting activity       PT Treatment Interventions Gait training;Stair training;Therapeutic exercise;Functional mobility training;Therapeutic activities;Balance  training;Patient/family education    PT Goals (Current goals can be found in the Care Plan section)  Acute Rehab PT Goals Patient Stated Goal: go home PT Goal Formulation: With patient Time For Goal Achievement: 01/10/21 Potential to Achieve Goals: Good    Frequency Min 2X/week   Barriers to discharge        Co-evaluation               AM-PAC PT "6 Clicks" Mobility  Outcome Measure Help needed turning from your back to your side while in a flat bed without using bedrails?: None Help needed moving from lying on your back to sitting on the side of a flat bed without using bedrails?: None Help needed moving to and from a bed to a chair (including a wheelchair)?: None Help needed standing up from a chair using your arms (e.g., wheelchair or bedside chair)?: None Help needed to walk in hospital room?: A Little Help needed climbing 3-5 steps with a railing? : A Little 6 Click Score: 22    End of Session Equipment Utilized During Treatment: Gait belt;Oxygen (2L) Activity Tolerance: Patient limited by fatigue;Patient tolerated treatment well Patient left: in chair;with call bell/phone within reach Nurse Communication: Mobility status PT Visit Diagnosis: Muscle weakness (generalized) (M62.81);Difficulty in walking, not elsewhere classified (R26.2)    Time: 3532-9924 PT Time Calculation (min) (ACUTE ONLY): 25 min   Charges:   PT Evaluation $PT Eval Low Complexity: 1 Low PT Treatments $Gait Training: 8-22 mins        Kreg Shropshire, DPT 12/27/2020, 1:55 PM

## 2020-12-28 ENCOUNTER — Inpatient Hospital Stay: Payer: Medicare Other

## 2020-12-28 DIAGNOSIS — J189 Pneumonia, unspecified organism: Secondary | ICD-10-CM | POA: Diagnosis not present

## 2020-12-28 LAB — CULTURE, BLOOD (ROUTINE X 2): Special Requests: ADEQUATE

## 2020-12-28 LAB — CBC WITH DIFFERENTIAL/PLATELET
Abs Immature Granulocytes: 0.19 10*3/uL — ABNORMAL HIGH (ref 0.00–0.07)
Basophils Absolute: 0 10*3/uL (ref 0.0–0.1)
Basophils Relative: 0 %
Eosinophils Absolute: 0 10*3/uL (ref 0.0–0.5)
Eosinophils Relative: 0 %
HCT: 30.7 % — ABNORMAL LOW (ref 36.0–46.0)
Hemoglobin: 10.2 g/dL — ABNORMAL LOW (ref 12.0–15.0)
Immature Granulocytes: 2 %
Lymphocytes Relative: 6 %
Lymphs Abs: 0.8 10*3/uL (ref 0.7–4.0)
MCH: 31.2 pg (ref 26.0–34.0)
MCHC: 33.2 g/dL (ref 30.0–36.0)
MCV: 93.9 fL (ref 80.0–100.0)
Monocytes Absolute: 0.7 10*3/uL (ref 0.1–1.0)
Monocytes Relative: 6 %
Neutro Abs: 10.2 10*3/uL — ABNORMAL HIGH (ref 1.7–7.7)
Neutrophils Relative %: 86 %
Platelets: 413 10*3/uL — ABNORMAL HIGH (ref 150–400)
RBC: 3.27 MIL/uL — ABNORMAL LOW (ref 3.87–5.11)
RDW: 14.3 % (ref 11.5–15.5)
WBC: 11.9 10*3/uL — ABNORMAL HIGH (ref 4.0–10.5)
nRBC: 0 % (ref 0.0–0.2)

## 2020-12-28 LAB — LACTIC ACID, PLASMA: Lactic Acid, Venous: 1 mmol/L (ref 0.5–1.9)

## 2020-12-28 LAB — GLUCOSE, CAPILLARY
Glucose-Capillary: 95 mg/dL (ref 70–99)
Glucose-Capillary: 81 mg/dL (ref 70–99)
Glucose-Capillary: 122 mg/dL — ABNORMAL HIGH (ref 70–99)
Glucose-Capillary: 100 mg/dL — ABNORMAL HIGH (ref 70–99)

## 2020-12-28 LAB — HEMOGLOBIN A1C
Hgb A1c MFr Bld: 5.9 % — ABNORMAL HIGH (ref 4.8–5.6)
Mean Plasma Glucose: 122.63 mg/dL

## 2020-12-28 LAB — PROCALCITONIN: Procalcitonin: 0.29 ng/mL

## 2020-12-28 MED ORDER — AMLODIPINE BESYLATE 5 MG PO TABS
5.0000 mg | ORAL_TABLET | Freq: Every day | ORAL | Status: DC
  Administered 2020-12-28 – 2020-12-30 (×3): 5 mg via ORAL
  Filled 2020-12-28 (×4): qty 1

## 2020-12-28 MED ORDER — BUDESONIDE 0.5 MG/2ML IN SUSP
0.5000 mg | Freq: Two times a day (BID) | RESPIRATORY_TRACT | Status: DC
  Administered 2020-12-28 – 2020-12-31 (×6): 0.5 mg via RESPIRATORY_TRACT
  Filled 2020-12-28 (×6): qty 2

## 2020-12-28 NOTE — Plan of Care (Signed)
Pt is alert and oriented X4. Pt has productive cough with diminished& rales on both lungs; oxygen increased to 4lpm/[place] as her oxygen saturation was at 88-89% on 2L. Latest oxygen sat at 90-92% on 4Lpm/[place].

## 2020-12-28 NOTE — Progress Notes (Signed)
PROGRESS NOTE  Anita Benjamin HYI:[id number] DOB: 1934-09-07 DOA: 12/25/2020 PCP: Einar Pheasant, MD  HPI/Recap of past 24 hours: Anita Benjamin is a 87 y.o. 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  Her symptoms have been going on for about a week with associated generalized weakness, fever and chills.  She had a T-max more than 102 today and her pulse oximetry dropped to the high 70s per her daughter who was with her in the ER.  She was seen in urgent care on Monday and reportedly diagnosed with positive RSV and she was negative for Covid and flu.  She was given medications for cough and nausea.  Symptoms have been getting worse.    She admitted to nausea and vomiting on the day of presentation.    Work-up revealed multifocal pneumonia for which she was started on Rocephin and azithromycin empirically.  Positive blood culture drawn on 12/25/2020, gram-positive cocci which is thought to be contaminant.  Sputum culture 12/26/20 grew moderate Streptococcus pneumoniae.  12/28/20: Seen and examined at her bedside.  Reports generalized weakness and worsening productive cough.  Worsening hypoxemia this morning, currently on 4 L O2 saturation in the low 90s.  Assessment/Plan: Active Problems:   CAP (community acquired pneumonia)   Sepsis secondary to streptococcus pneumoniae pneumonia, POA. She presented with fever with T-max 100.9, respiration rate 25, productive cough and evidence of pneumonia on chest x-ray. Recently diagnosed with RSV at urgent care. Sputum culture 12/26/20 grew moderate Streptococcus pneumoniae She was started on Rocephin and azithromycin empirically in the ED, continue. Continue pulmonary toilette. CT chest with no  contrast obtained to further assess, follow results Continue to monitor WBC and fever curve.  Worsening acute hypoxic respiratory failure secondary to above Not on oxygen supplementation at baseline She is currently on 4 L O2 saturation in the low 90s. Wean off oxygen supplementation as tolerated. Home O2 evaluation prior to DC  Resolved Acute hypovolemic hyponatremia She presented with serum sodium of 127, improved with IV fluid hydration normal saline. Repeated serum sodium 137  Type 2 diabetes with hyperglycemia Hemoglobin A1c 5.9 Continue ISS  Iron deficiency anemia/Chronic normocytic anemia Hemoglobin is currently 10 Iron studies suggestive of iron deficiency. No overt bleeding Continue ferrous sulfate  Uncontrolled essential hypertension BP is currently not at goal Increased dose of Norvasc up to 10 mg daily. Continue home losartan 100 mg daily. Continue to monitor vital signs.  Chronic anxiety/depression Stable Continue home Zoloft  Paroxysmal A. fib, on Eliquis She is currently rate controlled Continue home Norvasc 5 mg daily Continue home Eliquis  continue to monitor on telemetry  Generalized weakness/physical debility likely contributed by acute illness, sepsis. PT OT recommended home PT OT TOC consulted to arrange home health services Continue Fall precautions   Code Status: Full code.  Family Communication: updated daughter via phone  Disposition Plan: Likely will discharge to home when hemodynamically stable and oxygen requirement has improved.   Consultants:  None.  Procedures:  None.  Antimicrobials:  Rocephin  Azithromycin  DVT prophylaxis:  Eliquis  Status is: Inpatient    Dispo:  Patient From: Home  Planned Disposition: Home with Health Care Svc  Anticipated date of discharge 12/30/2020.  Medically stable for discharge: No, ongoing management of sepsis secondary to community-acquired pneumonia, acute hypoxic respiratory  failure.          Objective: Vitals:   12/28/20 0402 12/28/20 0802 12/28/20 1209 12/28/20 1426  BP:  (!) 162/79 (!) 164/64   Pulse:  85 80   Resp:  19 16   Temp:  99.5 F (37.5 C) 98.3 F (36.8 C)   TempSrc:   Oral   SpO2: 92% 93% 97% 92%  Weight:      Height:        Intake/Output Summary (Last 24 hours) at 12/28/2020 1434 Last data filed at 12/28/2020 0340 Gross per 24 hour  Intake 1375.66 ml  Output 750 ml  Net 625.66 ml   Filed Weights   12/26/20 0026  Weight: 59.5 kg    Exam:   General: 87 y.o. year-old female frail-appearing no acute distress.  She is alert and oriented x3.    Cardiovascular: Regular rate and rhythm no rubs or gallops.  Respiratory: Diffuse rales bilaterally no wheezing noted.  Poor inspiratory effort.    Abdomen: Soft nontender normal bowel sounds present.  Musculoskeletal: No extremity edema bilaterally.  Skin: No ulcerative lesions noted.  Psychiatry: Mood is appropriate for condition and setting.   Data Reviewed: CBC: Recent Labs  Lab 12/25/20 2025 12/26/20 0505 12/27/20 0530 12/28/20 0437  WBC 10.1 9.5 13.8* 11.9*  NEUTROABS 9.1*  --   --  10.2*  HGB 10.8* 11.1* 10.0* 10.2*  HCT 31.2* 33.0* 29.2* 30.7*  MCV 91.5 92.7 92.4 93.9  PLT 320 342 367 751*   Basic Metabolic Panel: Recent Labs  Lab 12/25/20 2025 12/26/20 0505 12/27/20 0530  NA 127* 132* 137  K 4.3 4.0 4.5  CL 92* 100 107  CO2 25 21* 24  GLUCOSE 126* 205* 125*  BUN 19 17 17   CREATININE 1.16* 1.00 0.71  CALCIUM 9.0 8.9 8.9  MG  --   --  2.1  PHOS  --   --  2.8   GFR: Estimated Creatinine Clearance: 49.2 mL/min (by C-G formula based on SCr of 0.71 mg/dL). Liver Function Tests: Recent Labs  Lab 12/26/20 0505  AST 38  ALT 28  ALKPHOS 73  BILITOT 0.6  PROT 6.3*  ALBUMIN 2.6*   No results for input(s): LIPASE, AMYLASE in the last 168 hours. No results for input(s): AMMONIA in the last 168 hours. Coagulation Profile: No results for input(s):  INR, PROTIME in the last 168 hours. Cardiac Enzymes: No results for input(s): CKTOTAL, CKMB, CKMBINDEX, TROPONINI in the last 168 hours. BNP (last 3 results) No results for input(s): PROBNP in the last 8760 hours. HbA1C: Recent Labs    12/28/20 0437  HGBA1C 5.9*   CBG: Recent Labs  Lab 12/27/20 1644 12/27/20 2059 12/28/20 0727 12/28/20 1222  GLUCAP 113* 105* 95 81   Lipid Profile: No results for input(s): CHOL, HDL, LDLCALC, TRIG, CHOLHDL, LDLDIRECT in the last 72 hours. Thyroid Function Tests: No results for input(s): TSH, T4TOTAL, FREET4, T3FREE, THYROIDAB in the last 72 hours. Anemia Panel: Recent Labs    12/25/20 2025 12/25/20 2305  VITAMINB12  --  2,017*  FOLATE 20.6  --   FERRITIN 336*  --   TIBC 185*  --   IRON 17*  --   RETICCTPCT  --  1.1   Urine analysis:    Component Value Date/Time   COLORURINE YELLOW (A) 06/27/2020 1658   APPEARANCEUR CLEAR (A) 06/27/2020 1658   LABSPEC 1.014 06/27/2020 1658   PHURINE 6.0 06/27/2020 1658   GLUCOSEU NEGATIVE 06/27/2020 1658   GLUCOSEU  NEGATIVE 10/09/2014 [place] 06/27/2020 [place] 06/27/2020 1658   BILIRUBINUR neg 09/09/2015 [place] 06/27/2020 1658   PROTEINUR NEGATIVE 06/27/2020 1658   UROBILINOGEN 0.2 09/09/2015 1049   UROBILINOGEN 0.2 10/09/2014 1641   NITRITE NEGATIVE 06/27/2020 1658   LEUKOCYTESUR NEGATIVE 06/27/2020 1658   Sepsis Labs: @LABRCNTIP (procalcitonin:4,lacticidven:4)  ) Recent Results (from the past 240 hour(s))  Resp Panel by RT-PCR (Flu A&B, Covid) Nasopharyngeal Swab     Status: None   Collection Time: 12/22/20 11:11 AM   Specimen: Nasopharyngeal Swab; Nasopharyngeal(NP) swabs in vial transport medium  Result Value Ref Range Status   SARS Coronavirus 2 by RT PCR NEGATIVE NEGATIVE Final    Comment: (NOTE) SARS-CoV-2 target nucleic acids are NOT DETECTED.  The SARS-CoV-2 RNA is generally detectable in upper respiratory specimens during  the acute phase of infection. The lowest concentration of SARS-CoV-2 viral copies this assay can detect is 138 copies/mL. A negative result does not preclude SARS-Cov-2 infection and should not be used as the sole basis for treatment or other patient management decisions. A negative result may occur with  improper specimen collection/handling, submission of specimen other than nasopharyngeal swab, presence of viral mutation(s) within the areas targeted by this assay, and inadequate number of viral copies(<138 copies/mL). A negative result must be combined with clinical observations, patient history, and epidemiological information. The expected result is Negative.  Fact Sheet for Patients:  EntrepreneurPulse.com.au  Fact Sheet for Healthcare Providers:  IncredibleEmployment.be  This test is no t yet approved or cleared by the [place] FDA and  has been authorized for detection and/or diagnosis of SARS-CoV-2 by FDA under an Emergency Use Authorization (EUA). This EUA will remain  in effect (meaning this test can be used) for the duration of the COVID-19 declaration under Section 564(b)(1) of the Act, 21 U.S.C.section 360bbb-3(b)(1), unless the authorization is terminated  or revoked sooner.       Influenza A by PCR NEGATIVE NEGATIVE Final   Influenza B by PCR NEGATIVE NEGATIVE Final    Comment: (NOTE) The Xpert Xpress SARS-CoV-2/FLU/RSV plus assay is intended as an aid in the diagnosis of influenza from Nasopharyngeal swab specimens and should not be used as a sole basis for treatment. Nasal washings and aspirates are unacceptable for Xpert Xpress SARS-CoV-2/FLU/RSV testing.  Fact Sheet for Patients: EntrepreneurPulse.com.au  Fact Sheet for Healthcare Providers: IncredibleEmployment.be  This test is not yet approved or cleared by the [place] FDA and has been authorized for detection and/or  diagnosis of SARS-CoV-2 by FDA under an Emergency Use Authorization (EUA). This EUA will remain in effect (meaning this test can be used) for the duration of the COVID-19 declaration under Section 564(b)(1) of the Act, 21 U.S.C. section 360bbb-3(b)(1), unless the authorization is terminated or revoked.  Performed at [facility], [address], [place], [place] [address]   Blood culture (routine x 2)     Status: None (Preliminary result)   Collection Time: 12/25/20  8:25 PM   Specimen: BLOOD  Result Value Ref Range Status   Specimen Description BLOOD RIGHT ANTECUBITAL  Final   Special Requests   Final    BOTTLES DRAWN AEROBIC AND ANAEROBIC Blood Culture adequate volume   Culture   Final    NO GROWTH 3 DAYS Performed at [facility], [address] [place], [place] [address]    Report Status PENDING  Incomplete  Resp Panel by RT-PCR (Flu A&B, Covid) Nasopharyngeal Swab  Status: None   Collection Time: 12/25/20  9:14 PM   Specimen: Nasopharyngeal Swab; Nasopharyngeal(NP) swabs in vial transport medium  Result Value Ref Range Status   SARS Coronavirus 2 by RT PCR NEGATIVE NEGATIVE Final    Comment: (NOTE) SARS-CoV-2 target nucleic acids are NOT DETECTED.  The SARS-CoV-2 RNA is generally detectable in upper respiratory specimens during the acute phase of infection. The lowest concentration of SARS-CoV-2 viral copies this assay can detect is 138 copies/mL. A negative result does not preclude SARS-Cov-2 infection and should not be used as the sole basis for treatment or other patient management decisions. A negative result may occur with  improper specimen collection/handling, submission of specimen other than nasopharyngeal swab, presence of viral mutation(s) within the areas targeted by this assay, and inadequate number of viral copies(<138 copies/mL). A negative result must be combined with clinical observations, patient history, and  epidemiological information. The expected result is Negative.  Fact Sheet for Patients:  EntrepreneurPulse.com.au  Fact Sheet for Healthcare Providers:  IncredibleEmployment.be  This test is no t yet approved or cleared by the [place] FDA and  has been authorized for detection and/or diagnosis of SARS-CoV-2 by FDA under an Emergency Use Authorization (EUA). This EUA will remain  in effect (meaning this test can be used) for the duration of the COVID-19 declaration under Section 564(b)(1) of the Act, 21 U.S.C.section 360bbb-3(b)(1), unless the authorization is terminated  or revoked sooner.       Influenza A by PCR NEGATIVE NEGATIVE Final   Influenza B by PCR NEGATIVE NEGATIVE Final    Comment: (NOTE) The Xpert Xpress SARS-CoV-2/FLU/RSV plus assay is intended as an aid in the diagnosis of influenza from Nasopharyngeal swab specimens and should not be used as a sole basis for treatment. Nasal washings and aspirates are unacceptable for Xpert Xpress SARS-CoV-2/FLU/RSV testing.  Fact Sheet for Patients: EntrepreneurPulse.com.au  Fact Sheet for Healthcare Providers: IncredibleEmployment.be  This test is not yet approved or cleared by the [place] FDA and has been authorized for detection and/or diagnosis of SARS-CoV-2 by FDA under an Emergency Use Authorization (EUA). This EUA will remain in effect (meaning this test can be used) for the duration of the COVID-19 declaration under Section 564(b)(1) of the Act, 21 U.S.C. section 360bbb-3(b)(1), unless the authorization is terminated or revoked.  Performed at [facility], [address] [place], [place] [address]   Blood culture (routine x 2)     Status: Abnormal   Collection Time: 12/25/20 10:30 PM   Specimen: BLOOD  Result Value Ref Range Status   Specimen Description   Final    BLOOD LEFT ANTECUBITAL Performed at [facility], [address], [place], [place] [address]    Special Requests   Final    BOTTLES DRAWN AEROBIC AND ANAEROBIC Blood Culture adequate volume Performed at [facility], [address] [place], [place] [address]    Culture  Setup Time   Final    GRAM POSITIVE COCCI AEROBIC BOTTLE ONLY CRITICAL RESULT CALLED TO, READ BACK BY AND VERIFIED WITH: MORGAN HICKS AT 1844 ON 12/26/20 BY SS    Culture (A)  Final    STAPHYLOCOCCUS HOMINIS THE SIGNIFICANCE OF ISOLATING THIS ORGANISM FROM A SINGLE SET OF BLOOD CULTURES WHEN MULTIPLE SETS ARE DRAWN IS UNCERTAIN. PLEASE NOTIFY THE MICROBIOLOGY DEPARTMENT WITHIN ONE WEEK IF SPECIATION AND SENSITIVITIES ARE REQUIRED. Performed at [facility], [place] [address] [place], [place] [address]    Report Status 12/28/2020  FINAL  Final  Blood Culture ID Panel (Reflexed)     Status: Abnormal   Collection Time: 12/25/20 10:30 PM  Result Value Ref Range Status   Enterococcus faecalis NOT DETECTED NOT DETECTED Final   Enterococcus Faecium NOT DETECTED NOT DETECTED Final   Listeria monocytogenes NOT DETECTED NOT DETECTED Final   Staphylococcus species DETECTED (A) NOT DETECTED Final    Comment: CRITICAL RESULT CALLED TO, READ BACK BY AND VERIFIED WITH: MORGAN HICKS AT 1844 ON 12/26/20 BY SS    Staphylococcus aureus (BCID) NOT DETECTED NOT DETECTED Final   Staphylococcus epidermidis NOT DETECTED NOT DETECTED Final   Staphylococcus lugdunensis NOT DETECTED NOT DETECTED Final   Streptococcus species NOT DETECTED NOT DETECTED Final   Streptococcus agalactiae NOT DETECTED NOT DETECTED Final   Streptococcus pneumoniae NOT DETECTED NOT DETECTED Final   Streptococcus pyogenes NOT DETECTED NOT DETECTED Final   A.calcoaceticus-baumannii NOT DETECTED NOT DETECTED Final   Bacteroides fragilis NOT DETECTED NOT DETECTED Final   Enterobacterales NOT DETECTED NOT DETECTED Final   Enterobacter cloacae complex NOT DETECTED NOT DETECTED Final    Escherichia coli NOT DETECTED NOT DETECTED Final   Klebsiella aerogenes NOT DETECTED NOT DETECTED Final   Klebsiella oxytoca NOT DETECTED NOT DETECTED Final   Klebsiella pneumoniae NOT DETECTED NOT DETECTED Final   Proteus species NOT DETECTED NOT DETECTED Final   Salmonella species NOT DETECTED NOT DETECTED Final   Serratia marcescens NOT DETECTED NOT DETECTED Final   Haemophilus influenzae NOT DETECTED NOT DETECTED Final   Neisseria meningitidis NOT DETECTED NOT DETECTED Final   Pseudomonas aeruginosa NOT DETECTED NOT DETECTED Final   Stenotrophomonas maltophilia NOT DETECTED NOT DETECTED Final   Candida albicans NOT DETECTED NOT DETECTED Final   Candida auris NOT DETECTED NOT DETECTED Final   Candida glabrata NOT DETECTED NOT DETECTED Final   Candida krusei NOT DETECTED NOT DETECTED Final   Candida parapsilosis NOT DETECTED NOT DETECTED Final   Candida tropicalis NOT DETECTED NOT DETECTED Final   Cryptococcus neoformans/gattii NOT DETECTED NOT DETECTED Final    Comment: Performed at [facility], [address] [place], [place] [address]  Culture, sputum-assessment     Status: None   Collection Time: 12/26/20  6:24 AM   Specimen: Sputum  Result Value Ref Range Status   Specimen Description SPUTUM  Final   Special Requests NONE  Final   Sputum evaluation   Final    THIS SPECIMEN IS ACCEPTABLE FOR SPUTUM CULTURE Performed at [facility], [address] [place], [place] [address]    Report Status 12/26/2020 FINAL  Final  Culture, Respiratory w Gram Stain     Status: None (Preliminary result)   Collection Time: 12/26/20  6:24 AM   Specimen: SPU  Result Value Ref Range Status   Specimen Description   Final    SPUTUM Performed at [facility], [address], [place], [place] [address]    Special Requests   Final    NONE Reflexed from [id number] Performed at [facility], [place], [place] [address]    Gram Stain   Final     MODERATE WBC PRESENT,BOTH PMN AND MONONUCLEAR MODERATE GRAM POSITIVE COCCI MODERATE GRAM VARIABLE ROD    Culture   Final    MODERATE STREPTOCOCCUS PNEUMONIAE SUSCEPTIBILITIES TO FOLLOW Performed at [facility], [address] [place], [place] [address]    Report Status PENDING  Incomplete  CULTURE, BLOOD (ROUTINE X 2) w Reflex to ID Panel  Status: None (Preliminary result)   Collection Time: 12/27/20  5:30 AM   Specimen: BLOOD  Result Value Ref Range Status   Specimen Description BLOOD RIGHT FOREARM  Final   Special Requests   Final    BOTTLES DRAWN AEROBIC AND ANAEROBIC Blood Culture adequate volume   Culture   Final    NO GROWTH < 24 HOURS Performed at [facility], [address] [place], [place] [address]    Report Status PENDING  Incomplete  CULTURE, BLOOD (ROUTINE X 2) w Reflex to ID Panel     Status: None (Preliminary result)   Collection Time: 12/27/20  5:36 AM   Specimen: BLOOD  Result Value Ref Range Status   Specimen Description BLOOD RIGHT ASSIST CONTROL  Final   Special Requests   Final    BOTTLES DRAWN AEROBIC AND ANAEROBIC Blood Culture results may not be optimal due to an inadequate volume of blood received in culture bottles   Culture   Final    NO GROWTH < 24 HOURS Performed at [facility], [address], [place], [place] [address]    Report Status PENDING  Incomplete      Studies: No results found.  Scheduled Meds:  amLODipine  5 mg Oral Q supper   apixaban  2.5 mg Oral BID   ascorbic acid  250 mg Oral Daily   benzonatate  200 mg Oral Q8H   budesonide (PULMICORT) nebulizer solution  0.5 mg Nebulization BID   calcium citrate-vitamin D  1 tablet Oral Daily   famotidine  20 mg Oral Daily   ferrous sulfate  325 mg Oral Q breakfast   guaiFENesin  1,200 mg Oral BID   insulin aspart  0-6 Units Subcutaneous TID WC   ipratropium-albuterol  3 mL Nebulization TID   losartan  100 mg Oral Daily   multivitamin  with minerals  1 tablet Oral Daily   sertraline  50 mg Oral QHS    Continuous Infusions:  sodium chloride 50 mL/hr at 12/28/20 0759   azithromycin Stopped (12/27/20 2325)   cefTRIAXone (ROCEPHIN)  IV 2 g (12/28/20 1403)     LOS: 3 days     Kayleen Memos, MD [facility] Pager [phone]  If 7PM-7AM, please contact night-coverage [email or web address] Password [facility] 12/28/2020, 2:34 PM

## 2020-12-29 ENCOUNTER — Inpatient Hospital Stay: Payer: Medicare Other

## 2020-12-29 DIAGNOSIS — J189 Pneumonia, unspecified organism: Secondary | ICD-10-CM | POA: Diagnosis not present

## 2020-12-29 LAB — CBC
HCT: 30.2 % — ABNORMAL LOW (ref 36.0–46.0)
Hemoglobin: 10.5 g/dL — ABNORMAL LOW (ref 12.0–15.0)
MCH: 32 pg (ref 26.0–34.0)
MCHC: 34.8 g/dL (ref 30.0–36.0)
MCV: 92.1 fL (ref 80.0–100.0)
Platelets: 393 10*3/uL (ref 150–400)
RBC: 3.28 MIL/uL — ABNORMAL LOW (ref 3.87–5.11)
RDW: 13.8 % (ref 11.5–15.5)
WBC: 12.7 10*3/uL — ABNORMAL HIGH (ref 4.0–10.5)
nRBC: 0 % (ref 0.0–0.2)

## 2020-12-29 LAB — GLUCOSE, CAPILLARY
Glucose-Capillary: 92 mg/dL (ref 70–99)
Glucose-Capillary: 113 mg/dL — ABNORMAL HIGH (ref 70–99)
Glucose-Capillary: 131 mg/dL — ABNORMAL HIGH (ref 70–99)

## 2020-12-29 LAB — CULTURE, RESPIRATORY W GRAM STAIN

## 2020-12-29 LAB — BASIC METABOLIC PANEL
Anion gap: 8 (ref 5–15)
BUN: 8 mg/dL (ref 8–23)
CO2: 27 mmol/L (ref 22–32)
Calcium: 8.6 mg/dL — ABNORMAL LOW (ref 8.9–10.3)
Chloride: 98 mmol/L (ref 98–111)
Creatinine, Ser: 0.61 mg/dL (ref 0.44–1.00)
GFR, Estimated: >60 mL/min (ref >60)
Glucose, Bld: 100 mg/dL — ABNORMAL HIGH (ref 70–99)
Potassium: 4.3 mmol/L (ref 3.5–5.1)
Sodium: 133 mmol/L — ABNORMAL LOW (ref 135–145)

## 2020-12-29 LAB — PROTEIN, TOTAL: Total Protein: 6.1 g/dL — ABNORMAL LOW (ref 6.5–8.1)

## 2020-12-29 LAB — BODY FLUID CELL COUNT WITH DIFFERENTIAL
Eos, Fluid: 0 %
Lymphs, Fluid: 20 %
Monocyte-Macrophage-Serous Fluid: 14 %
Neutrophil Count, Fluid: 66 %
Other Cells, Fluid: 0 %
Total Nucleated Cell Count, Fluid: 546 cu mm

## 2020-12-29 LAB — PROTEIN, PLEURAL OR PERITONEAL FLUID: Total protein, fluid: <3 g/dL

## 2020-12-29 LAB — LACTATE DEHYDROGENASE, PLEURAL OR PERITONEAL FLUID: LD, Fluid: 71 U/L — ABNORMAL HIGH (ref 3–23)

## 2020-12-29 LAB — PROCALCITONIN: Procalcitonin: 0.11 ng/mL

## 2020-12-29 LAB — LACTATE DEHYDROGENASE: LDH: 190 U/L (ref 98–192)

## 2020-12-29 MED ORDER — METOPROLOL TARTRATE 5 MG/5ML IV SOLN
2.5000 mg | Freq: Four times a day (QID) | INTRAVENOUS | Status: DC | PRN
  Administered 2020-12-29: 2.5 mg via INTRAVENOUS
  Filled 2020-12-29: qty 5

## 2020-12-29 NOTE — Progress Notes (Signed)
PT Cancellation Note  Patient Details Name: Anita Benjamin MRN: [id number] DOB: September 14, 1934   Cancelled Treatment:    Reason Eval/Treat Not Completed: Medical issues which prohibited therapy   Pt sitting EOB finish lunch.  Vitals stable.  Pt agrees to gait.  RN in requesting to hold mobility at this time given event earlier today with increased HR and temp.  HR now in 80's with sats 95%.  Held mobility per RN request.  Will continue tomorrow.   Chesley Noon 12/29/2020, 2:50 PM

## 2020-12-29 NOTE — Procedures (Signed)
Ultrasound-guided diagnostic and therapeutic  loculated left sided thoracentesis performed yielding 100 mililiters of serosanginous colored fluid. No immediate complications.   Diagnostic fluid was sent to the lab for further analysis. Follow-up chest x-ray pending. EBL is < 2 ml.

## 2020-12-29 NOTE — Progress Notes (Signed)
   12/29/20 1200  Assess: MEWS Score  Temp 98.2 F (36.8 C)  BP 111/74  Pulse Rate (!) 137  Resp (!) 22  Level of Consciousness Alert  SpO2 99 %  O2 Device Nasal Cannula  O2 Flow Rate (L/min) 4 L/min  Assess: MEWS Score  MEWS Temp 0  MEWS Systolic 0  MEWS Pulse 3  MEWS RR 1  MEWS LOC 0  MEWS Score 4  MEWS Score Color Red  Assess: if the MEWS score is Yellow or Red  Were vital signs taken at a resting state? Yes  Focused Assessment Change from prior assessment (see assessment flowsheet)  Early Detection of Sepsis Score *See Row Information* Low  MEWS guidelines implemented *See Row Information* Yes  Treat  MEWS Interventions Escalated (See documentation below)  Pain Scale 0-10  Pain Score 0  Take Vital Signs  Increase Vital Sign Frequency  Red: Q 1hr X 4 then Q 4hr X 4, if remains red, continue Q 4hrs  Escalate  MEWS: Escalate Red: discuss with charge nurse/RN and provider, consider discussing with RRT  Notify: Charge Nurse/RN  Name of Charge Nurse/RN Notified Donald, RN  Date Charge Nurse/RN Notified 12/29/20  Time Charge Nurse/RN Notified 1201  Notify: Provider  Provider Name/Title Irene Pap  Date Provider Notified 12/29/20  Time Provider Notified 1202  Notification Type Page  Notification Reason Change in status  Provider response See new orders  Date of Provider Response 12/29/20  Time of Provider Response 1202  Notify: Rapid Response  Name of Rapid Response RN Notified Charli,RN  Date Rapid Response Notified 12/29/20  Time Rapid Response Notified 1202

## 2020-12-29 NOTE — Significant Event (Signed)
Rapid Response Event Note   Reason for Call :  Tachycardia  Initial Focused Assessment:  Patient alert and oriented- found to have a heartrate in the 140's initially- now 120-130's. BP stable 226'J systolic,     Interventions:  EKG ordered as well as 2.5 of IV metoprolol-   Plan of Care:  Patient had no IV access- IV team attempting to place a line at this time- then Butch Penny, RN will give the metoprolol- I told her if the medication does not work to give me a call   Event Summary:   MD Notified: Dr. [place] Crane Call Time:12:01 Arrival Time:12:02 End FHLK:56:25  Penne Lash, RN

## 2020-12-29 NOTE — Consult Note (Signed)
NAME:  Anita Benjamin, MRN:  [id number], DOB:  August 28, 1934, LOS: 4 ADMISSION DATE:  12/25/2020, CONSULTATION DATE:  12/29/2020 REFERRING MD:  Dr. Nevada Crane, CHIEF COMPLAINT:  Shortness of Breath   Brief Pt Description:  87 year old female admitted with acute hypoxic respiratory failure and sepsis in the setting of Streptococcus Pneumoniae pneumonia.  CT chest on 12/28/2020 with Multifocal Pneumonia and small bilateral pleural effusions, with concern for loculation of the left pleural effusion.  History of Present Illness:  Anita Benjamin a 87 y.o.Caucasian femalewith medical history significant forhypertension, dyslipidemia, paroxysmal atrial fibrillation on Eliquis, GERD and anemia, presented to the ER on 12/25/20 with acute onset of worsening dyspnea with associated coughinitially productive of dark yellowish sputum and later with inability to expectorate. Her symptoms have been going on for about a week with associated generalized weakness, fever and chills.  She was febrile on the day of presentation with T-max of 102, hypoxic with O2 saturation in the 70s.  Daughter.  She was brought to the ED for further evaluation.  Reported recent diagnosis of RSV at urgent care.  COVID-19 screening test and influenza were negative.  She was admitted to the Med-Surg unit by the Hospitalist for further workup and treatment of Acute Hypoxic Respiratory Failure and Sepsis in the setting of Community Acquired Pneumonia.  Hospital Course: 1/2 bottles with positive blood culture drawn on 12/25/2020, Staph Hominis which is thought to be contaminant. Sputum cultures with streptococcal pneumoniae pneumonia for which she is currently on Rocephin and azithromycin, day #4.  She had a repeated CT chest done on 12/28/2020 which shows multifocal pneumonia, and concerning for possible left-sided loculated effusion.  On 12/29/20 a rapid response was called due to Tachycardia (140's) along with fever (T max 101.4).  She received 2.5 mg  IV Metoprolol with improvement in Heart rate.   PCCM is consulted for further workup of possible Left-sided loculated effusion and need to rule out empyema.  Pertinent  Medical History  Atrial fibrillation on Eliquis Hypertension Hypercholesterolemia GERD Anemia Chickenpox Fibrocystic breast disease  Significant Hospital Events: Including procedures, antibiotic start and stop dates in addition to other pertinent events    3/24: Admitted to Med-Surg unit by [facility]  3/28: PCCM consulted for possible Loculated Left Pleural Effusion; IR consulted for Thoracentesis   Cultures:  12/25/2020: SARS-CoV-2 PCR>> negative 12/25/2020: Influenza PCR>> negative 12/25/2020: Blood culture>>STAPHYLOCOCCUS HOMINIS, felt to likely be contaminant 12/26/2020: Sputum>>STREPTOCOCCUS PNEUMONIAE  12/27/2020: Blood culture x2>> no growth to date  Antimicrobials:  Azithromycin 3/24>> Ceftriaxone 3/24>>  Interim History / Subjective:  -PCCM consulted for questionable Loculated Left Pleural Effusion/Empyema -Rapid response called earlier due to Tachycardia, received 2.5 mg IV Metoprolol with improvement in HR -On 4L Nasal cannula with O2 sats 99% -Febrile  101.4 orally, otherwise Hemodynamically stable -WBC 12.7 (11.9), PCT 0.11 (0.29) -Will consult IR for Thoracentesis -Patient reports shortness of breath (which is improved), productive cough of greenish sputum -Denies chest pain, palpitations, abdominal pain, nausea, vomiting, diarrhea, chills  Objective   Blood pressure 123/74, pulse (!) 111, temperature 99.1 F (37.3 C), temperature source Oral, resp. rate 17, height 5' 7.01" (1.702 m), weight 59.5 kg, last menstrual period 10/27/1989, SpO2 99 %.        Intake/Output Summary (Last 24 hours) at 12/29/2020 1341 Last data filed at 12/29/2020 1017 Gross per 24 hour  Intake 120 ml  Output 150 ml  Net -30 ml   Filed Weights   12/26/20 0026  Weight: 59.5 kg    Examination: General:  Acutely  ill-appearing female, sitting up in bed, on 4 L nasal cannula, eating lunch, no acute distress HENT: Atraumatic, normocephalic, neck supple, no JVD Lungs: Coarse breath sounds bilaterally, no wheezing, mildly tachypneic, even Cardiovascular: Regular rate and rhythm, S1-S2, no murmurs, rubs, gallops, 2+ distal pulses Abdomen: Soft, nontender, nondistended, no guarding or rebound tenderness, bowel sounds positive x4 Extremities: Normal bulk and tone, no deformities, no edema Neuro: Awake, alert and oriented x4, follows commands, no focal deficits, speech clear GU: Deferred  Labs/imaging that I havepersonally reviewed  (right click and "Reselect all SmartList Selections" daily)  Sodium 133, procalcitonin 0.11, WBC 12.7, hemoglobin 10.5 CT chest without contrast 12/28/20>>1. Bilateral upper and lower lobe ground-glass and airspace densities are identified compatible with multifocal pneumonia. 2. Small bilateral pleural effusions. The left pleural effusion may be partially loculated. 3. Aortic atherosclerosis.  Resolved Hospital Problem list     Assessment & Plan:   Acute Hypoxic Respiratory Failure in the setting of Community Acquired Pneumonia & Bilateral Pleural Effusions  Suspected Loculated Left Pleural effusion, DDx Parapneumonic Effusion vs. Empyema -Supplemental O2 as needed to maintain O2 sats >92% -Follow intermittent CXR & ABG as needed -Prn Bronchodilators -Budesonide nebs -Antitussives -Continue Azithromycin & Ceftriaxone -Discussed with Dr. Mortimer Fries 12/29/20, he suspects Para-pneumonic effusion with pneumonia, but will obtain IR consult for Thoracentesis   Sepsis secondary to Streptococcus Pneumoniae Pneumonia -Monitor fever curve -Trend WBC's & Procalcitonin -Follow cultures as above -Continue Azithromycin & Ceftriaxone   Best practice (right click and "Reselect all SmartList Selections" daily)  Diet:  Oral Pain/Anxiety/Delirium protocol (if indicated): No VAP  protocol (if indicated): Not indicated DVT prophylaxis: Eliquis GI prophylaxis: H2B Glucose control:  SSI Yes Central venous access:  N/A Arterial line:  N/A Foley:  N/A Mobility:  OOB  PT consulted: N/A Last date of multidisciplinary goals of care discussion [N/A] Code Status:  full code Disposition: Med-Surg  Labs   CBC: Recent Labs  Lab 12/25/20 2025 12/26/20 0505 12/27/20 0530 12/28/20 0437 12/29/20 0551  WBC 10.1 9.5 13.8* 11.9* 12.7*  NEUTROABS 9.1*  --   --  10.2*  --   HGB 10.8* 11.1* 10.0* 10.2* 10.5*  HCT 31.2* 33.0* 29.2* 30.7* 30.2*  MCV 91.5 92.7 92.4 93.9 92.1  PLT 320 342 367 413* 263    Basic Metabolic Panel: Recent Labs  Lab 12/25/20 2025 12/26/20 0505 12/27/20 0530 12/29/20 0551  NA 127* 132* 137 133*  K 4.3 4.0 4.5 4.3  CL 92* 100 107 98  CO2 25 21* 24 27  GLUCOSE 126* 205* 125* 100*  BUN 19 17 17 8   CREATININE 1.16* 1.00 0.71 0.61  CALCIUM 9.0 8.9 8.9 8.6*  MG  --   --  2.1  --   PHOS  --   --  2.8  --    GFR: Estimated Creatinine Clearance: 49.2 mL/min (by C-G formula based on SCr of 0.61 mg/dL). Recent Labs  Lab 12/25/20 2025 12/25/20 2230 12/26/20 0505 12/26/20 0754 12/27/20 0530 12/28/20 0437 12/28/20 0543 12/29/20 0551  PROCALCITON 1.02  --   --   --  0.68 0.29  --  0.11  WBC 10.1  --  9.5  --  13.8* 11.9*  --  12.7*  LATICACIDVEN  --  1.7 3.1* 2.8*  --   --  1.0  --     Liver Function Tests: Recent Labs  Lab 12/26/20 0505  AST 38  ALT 28  ALKPHOS 73  BILITOT 0.6  PROT 6.3*  ALBUMIN  2.6*   No results for input(s): LIPASE, AMYLASE in the last 168 hours. No results for input(s): AMMONIA in the last 168 hours.  ABG No results found for: PHART, PCO2ART, PO2ART, HCO3, TCO2, ACIDBASEDEF, O2SAT   Coagulation Profile: No results for input(s): INR, PROTIME in the last 168 hours.  Cardiac Enzymes: No results for input(s): CKTOTAL, CKMB, CKMBINDEX, TROPONINI in the last 168 hours.  HbA1C: Hgb A1c MFr Bld   Date/Time Value Ref Range Status  12/28/2020 04:37 AM 5.9 (H) 4.8 - 5.6 % Final    Comment:    (NOTE) Pre diabetes:          5.7%-6.4%  Diabetes:              >6.4%  Glycemic control for   <7.0% adults with diabetes     CBG: Recent Labs  Lab 12/28/20 1222 12/28/20 1543 12/28/20 2209 12/29/20 0809 12/29/20 1202  GLUCAP 81 122* 100* 92 113*    Review of Systems:   Positives in BOLD: Gen: Denies fever, chills, weight change, fatigue, night sweats HEENT: Denies blurred vision, double vision, hearing loss, tinnitus, sinus congestion, rhinorrhea, sore throat, neck stiffness, dysphagia PULM: Denies shortness of breath, cough, sputum production, hemoptysis, wheezing CV: Denies chest pain, edema, orthopnea, paroxysmal nocturnal dyspnea, palpitations GI: Denies abdominal pain, nausea, vomiting, diarrhea, hematochezia, melena, constipation, change in bowel habits GU: Denies dysuria, hematuria, polyuria, oliguria, urethral discharge Endocrine: Denies hot or cold intolerance, polyuria, polyphagia or appetite change Derm: Denies rash, dry skin, scaling or peeling skin change Heme: Denies easy bruising, bleeding, bleeding gums Neuro: Denies headache, numbness, weakness, slurred speech, loss of memory or consciousness   Past Medical History:  She,  has a past medical history of Anemia, Atrial fibrillation (HCC), Atrial flutter ([place]), Fibrocystic breast disease, GERD (gastroesophageal reflux disease), History of chicken pox, Hypercholesterolemia, and Hypertension.   Surgical History:   Past Surgical History:  Procedure Laterality Date  . BREAST BIOPSY Right 2014   NEG  . BREAST LUMPECTOMY WITH SENTINEL LYMPH NODE BIOPSY Left 06/04/2020   Procedure: BREAST LUMPECTOMY WITH SENTINEL LYMPH NODE BX;  Surgeon: Robert Bellow, MD;  Location: [facility] ORS;  Service: General;  Laterality: Left;  . CATARACT EXTRACTION    . DILATION AND CURETTAGE OF UTERUS    . Laser repair of torn retina        Social History:   reports that she has never smoked. She has never used smokeless tobacco. She reports that she does not drink alcohol and does not use drugs.   Family History:  Her family history includes Breast cancer in her paternal aunt; Cancer (age of onset: 5) in her father; Heart disease (age of onset: 54) in her mother. There is no history of Colon cancer.   Allergies Allergies  Allergen Reactions  . Oruvail [Ketoprofen]   . Relafen [Nabumetone]   . Timentin [Ticarcillin-Pot Clavulanate]      Home Medications  Prior to Admission medications   Medication Sig Start Date End Date Taking? Authorizing Provider  acetaminophen (TYLENOL) 650 MG CR tablet Take 650 mg by mouth every 8 (eight) hours as needed for pain.   Yes [provider]  amLODipine (NORVASC) 5 MG tablet TAKE 1 TABLET BY MOUTH ONCE DAILY AT [facility] TIME. CALL OUR OFFICE TO SCHEDULE APPOINTMENT FOR FURTHER REFILLS ON MEDICATIONS. 12/22/20  Yes Gollan, Kathlene November, MD  apixaban (ELIQUIS) 2.5 MG TABS tablet Take 1 tablet (2.5 mg total) by mouth 2 (two) times daily. 06/07/20  Yes Byrnett, Forest Gleason, MD  Ascorbic Acid (VITAMIN C) 250 MG CHEW Chew 250 mg by mouth daily.    Yes [provider]  benzonatate (TESSALON) 100 MG capsule Take 2 capsules (200 mg total) by mouth every 8 (eight) hours. 12/22/20  Yes Margarette Canada, NP  calcium citrate-vitamin D 500-500 MG-UNIT chewable tablet Chew 1 tablet by mouth daily.   Yes [provider]  famotidine (PEPCID) 20 MG tablet Take 20 mg by mouth daily.   Yes [provider]  letrozole (FEMARA) 2.5 MG tablet Take 2.5 mg by mouth daily. 12/12/20  Yes [provider]  losartan (COZAAR) 100 MG tablet TAKE 1 TABLET BY MOUTH ONCE DAILY 08/25/20  Yes Einar Pheasant, MD  Multiple Vitamin (MULTIVITAMIN) tablet Take 1 tablet by mouth daily.   Yes [provider]  ondansetron (ZOFRAN ODT) 8 MG disintegrating tablet Take 1 tablet (8 mg total) by  mouth every 8 (eight) hours as needed for nausea or vomiting. 12/22/20  Yes Margarette Canada, NP  promethazine-dextromethorphan (PROMETHAZINE-DM) 6.25-15 MG/5ML syrup Take 5 mLs by mouth 4 (four) times daily as needed. Patient taking differently: Take 5 mLs by mouth 4 (four) times daily as needed. 12/22/20  Yes Margarette Canada, NP  propranolol (INDERAL) 10 MG tablet Take 10 mg by mouth 3 (three) times daily as needed (heart rate). Take 1st for rapid heart rate. If no response after 1 hour take diltiazem   Yes [provider]  sertraline (ZOLOFT) 50 MG tablet TAKE 1 TABLET BY MOUTH AT BEDTIME 11/24/20  Yes Einar Pheasant, MD     Critical care time: 74 minutes     Darel Hong, [facility] [place] [facility] Pager: [phone]

## 2020-12-29 NOTE — Care Management Important Message (Signed)
Important Message  Patient Details  Name: Anita Benjamin MRN: [id number] Date of Birth: 02-19-1934   Medicare Important Message Given:  Yes     [name] 12/29/2020, 11:30 AM

## 2020-12-29 NOTE — Progress Notes (Signed)
PROGRESS NOTE  Anita Benjamin KGY:[id number] DOB: 1933/10/07 DOA: 12/25/2020 PCP: Einar Pheasant, MD  HPI/Recap of past 24 hours: Anita Benjamin is a 87 y.o. Caucasian female with medical history significant for hypertension, dyslipidemia, paroxysmal atrial fibrillation on Eliquis, GERD and anemia, presented to the ER with acute onset of worsening dyspnea with associated cough initially productive of dark yellowish sputum and later with inability to expectorate.  Her symptoms have been going on for about a week with associated generalized weakness, fever and chills.  She was febrile on the day of presentation with T-max of 102, hypoxic with O2 saturation in the 70s.  Daughter.  She was brought to the ED for further evaluation.  Reported recent diagnosis of RSV at urgent care.  COVID-19 screening test and influenza were negative.  1/2 bottles with positive blood culture drawn on 12/25/2020, Staph Hominis which is thought to be contaminant.  Work-up revealed multifocal streptococcal pneumoniae pneumonia for which she is currently on Rocephin and azithromycin, day #4.  She had a repeated CT chest done on 12/28/2020 which shows findings concerning for possible left-sided loculated effusion, need to rule out empyema.  Pulmonary Dr. Mortimer Fries, consulted.  12/29/20: Patient was seen and examined at her bedside.  Reports her breathing is improved.  Cough is persistent and productive.  Assessment/Plan: Active Problems:   CAP (community acquired pneumonia)   Persistent sepsis secondary to streptococcus pneumoniae pneumonia, POA. She presented with fever with T-max 100.9, respiration rate 25, productive cough and evidence of pneumonia on chest x-ray. Recently diagnosed with RSV at urgent care. Sputum culture 12/26/20 grew moderate Streptococcus pneumoniae She was started on Rocephin and azithromycin empirically in the ED, day #4. CT chest with no contrast obtained to further assess which shows evidence of multifocal  pneumonia, findings concerning for possible left-sided loculated effusion, need to rule out empyema.   Pulmonary Dr. Mortimer Fries, consulted. WBC is uptrending Continue to monitor WBC and fever curve.  Possible left loculated pleural effusion seen on CT chest in the setting of strep pneumoniae CAP, POA. Management per above.  Paroxysmal A. fib with RVR, suspect driven by her lung physiology. She went into A. fib with RVR 12/29/2020 She is currently on Norvasc 5 mg daily. Added IV Lopressor as needed with parameters Continue Eliquis for primary CVA prevention. Continue on telemetry.  Worsening acute hypoxic respiratory failure secondary to above Not on oxygen supplementation at baseline She is currently on 4 L O2 saturation in the low 90s. Wean off oxygen supplementation as tolerated. Home O2 evaluation prior to DC  Resolved Acute hypovolemic hyponatremia She presented with serum sodium of 127, improved with IV fluid hydration normal saline. Repeated serum sodium 137 She is currently off IV fluid normal saline, downtrending her serum sodium, 133. Encourage increase in oral intake.  Type 2 diabetes with hyperglycemia Hemoglobin A1c 5.9 Continue ISS  Iron deficiency anemia/Chronic normocytic anemia Hemoglobin is currently 10, at baseline. Iron studies suggestive of iron deficiency. No overt bleeding Continue ferrous sulfate  Uncontrolled essential hypertension BP is currently not at goal Increased dose of Norvasc up to 10 mg daily. Continue home losartan 100 mg daily. Continue to monitor vital signs.  Chronic anxiety/depression Stable Continue home Zoloft  Generalized weakness/physical debility likely contributed by acute illness, sepsis. PT OT recommended home PT OT TOC consulted to arrange home health services Continue Fall precautions   Code Status: Full code.  Family Communication: Updated her daughter via phone and updated her granddaughter in the room.  Disposition  Plan: Likely will discharge to home when hemodynamically stable and oxygen requirement has improved.   Consultants:  Pulmonary.  Procedures:  None.  Antimicrobials:  Rocephin  Azithromycin  DVT prophylaxis:  Eliquis  Status is: Inpatient    Dispo:  Patient From: Home  Planned Disposition: Home with Health Care Svc  Anticipated date of discharge 12/30/2020.  Medically stable for discharge: No, ongoing management of sepsis secondary to community-acquired pneumonia, acute hypoxic respiratory failure.          Objective: Vitals:   12/28/20 2030 12/28/20 2329 12/29/20 0438 12/29/20 0733  BP: (!) 163/75 (!) 145/58 (!) 143/63 (!) 164/72  Pulse: 87 84 85 88  Resp: (!) 22 (!) 21 20 20   Temp: 99.1 F (37.3 C) 98.1 F (36.7 C) 98.1 F (36.7 C) 99.1 F (37.3 C)  TempSrc: Oral Oral Oral Oral  SpO2: 92% 91% 91% 94%  Weight:      Height:        Intake/Output Summary (Last 24 hours) at 12/29/2020 1132 Last data filed at 12/29/2020 1017 Gross per 24 hour  Intake 120 ml  Output 150 ml  Net -30 ml   Filed Weights   12/26/20 0026  Weight: 59.5 kg    Exam:  . General: 87 y.o. year-old female frail-appearing, in no acute distress.  She is alert and oriented x3. . Cardiovascular: Tachycardic with no rubs or gallops. Marland Kitchen Respiratory: Diffuse rales bilaterally no wheezing noted.  Good inspiratory effort.   . Abdomen: Soft nontender normal bowel sounds present. . Musculoskeletal: No lower extremity edema bilaterally. . Skin: No ulcerative lesions noted. Marland Kitchen Psychiatry: Mood is appropriate for condition and setting.   Data Reviewed: CBC: Recent Labs  Lab 12/25/20 2025 12/26/20 0505 12/27/20 0530 12/28/20 0437 12/29/20 0551  WBC 10.1 9.5 13.8* 11.9* 12.7*  NEUTROABS 9.1*  --   --  10.2*  --   HGB 10.8* 11.1* 10.0* 10.2* 10.5*  HCT 31.2* 33.0* 29.2* 30.7* 30.2*  MCV 91.5 92.7 92.4 93.9 92.1  PLT 320 342 367 413* 400   Basic Metabolic Panel: Recent Labs  Lab  12/25/20 2025 12/26/20 0505 12/27/20 0530 12/29/20 0551  NA 127* 132* 137 133*  K 4.3 4.0 4.5 4.3  CL 92* 100 107 98  CO2 25 21* 24 27  GLUCOSE 126* 205* 125* 100*  BUN 19 17 17 8   CREATININE 1.16* 1.00 0.71 0.61  CALCIUM 9.0 8.9 8.9 8.6*  MG  --   --  2.1  --   PHOS  --   --  2.8  --    GFR: Estimated Creatinine Clearance: 49.2 mL/min (by C-G formula based on SCr of 0.61 mg/dL). Liver Function Tests: Recent Labs  Lab 12/26/20 0505  AST 38  ALT 28  ALKPHOS 73  BILITOT 0.6  PROT 6.3*  ALBUMIN 2.6*   No results for input(s): LIPASE, AMYLASE in the last 168 hours. No results for input(s): AMMONIA in the last 168 hours. Coagulation Profile: No results for input(s): INR, PROTIME in the last 168 hours. Cardiac Enzymes: No results for input(s): CKTOTAL, CKMB, CKMBINDEX, TROPONINI in the last 168 hours. BNP (last 3 results) No results for input(s): PROBNP in the last 8760 hours. HbA1C: Recent Labs    12/28/20 0437  HGBA1C 5.9*   CBG: Recent Labs  Lab 12/28/20 0727 12/28/20 1222 12/28/20 1543 12/28/20 2209 12/29/20 0809  GLUCAP 95 81 122* 100* 92   Lipid Profile: No results for input(s): CHOL, HDL, LDLCALC, TRIG, CHOLHDL, LDLDIRECT  in the last 72 hours. Thyroid Function Tests: No results for input(s): TSH, T4TOTAL, FREET4, T3FREE, THYROIDAB in the last 72 hours. Anemia Panel: No results for input(s): VITAMINB12, FOLATE, FERRITIN, TIBC, IRON, RETICCTPCT in the last 72 hours. Urine analysis:    Component Value Date/Time   COLORURINE YELLOW (A) 06/27/2020 1658   APPEARANCEUR CLEAR (A) 06/27/2020 1658   LABSPEC 1.014 06/27/2020 1658   PHURINE 6.0 06/27/2020 1658   GLUCOSEU NEGATIVE 06/27/2020 1658   GLUCOSEU NEGATIVE 10/09/2014 1641   HGBUR NEGATIVE 06/27/2020 [place] 06/27/2020 1658   BILIRUBINUR neg 09/09/2015 1049   KETONESUR NEGATIVE 06/27/2020 1658   PROTEINUR NEGATIVE 06/27/2020 1658   UROBILINOGEN 0.2 09/09/2015 1049    UROBILINOGEN 0.2 10/09/2014 1641   NITRITE NEGATIVE 06/27/2020 1658   LEUKOCYTESUR NEGATIVE 06/27/2020 1658   Sepsis Labs: @LABRCNTIP (procalcitonin:4,lacticidven:4)  ) Recent Results (from the past 240 hour(s))  Resp Panel by RT-PCR (Flu A&B, Covid) Nasopharyngeal Swab     Status: None   Collection Time: 12/22/20 11:11 AM   Specimen: Nasopharyngeal Swab; Nasopharyngeal(NP) swabs in vial transport medium  Result Value Ref Range Status   SARS Coronavirus 2 by RT PCR NEGATIVE NEGATIVE Final    Comment: (NOTE) SARS-CoV-2 target nucleic acids are NOT DETECTED.  The SARS-CoV-2 RNA is generally detectable in upper respiratory specimens during the acute phase of infection. The lowest concentration of SARS-CoV-2 viral copies this assay can detect is 138 copies/mL. A negative result does not preclude SARS-Cov-2 infection and should not be used as the sole basis for treatment or other patient management decisions. A negative result may occur with  improper specimen collection/handling, submission of specimen other than nasopharyngeal swab, presence of viral mutation(s) within the areas targeted by this assay, and inadequate number of viral copies(<138 copies/mL). A negative result must be combined with clinical observations, patient history, and epidemiological information. The expected result is Negative.  Fact Sheet for Patients:  EntrepreneurPulse.com.au  Fact Sheet for Healthcare Providers:  IncredibleEmployment.be  This test is no t yet approved or cleared by the [place] FDA and  has been authorized for detection and/or diagnosis of SARS-CoV-2 by FDA under an Emergency Use Authorization (EUA). This EUA will remain  in effect (meaning this test can be used) for the duration of the COVID-19 declaration under Section 564(b)(1) of the Act, 21 U.S.C.section 360bbb-3(b)(1), unless the authorization is terminated  or revoked sooner.        Influenza A by PCR NEGATIVE NEGATIVE Final   Influenza B by PCR NEGATIVE NEGATIVE Final    Comment: (NOTE) The Xpert Xpress SARS-CoV-2/FLU/RSV plus assay is intended as an aid in the diagnosis of influenza from Nasopharyngeal swab specimens and should not be used as a sole basis for treatment. Nasal washings and aspirates are unacceptable for Xpert Xpress SARS-CoV-2/FLU/RSV testing.  Fact Sheet for Patients: EntrepreneurPulse.com.au  Fact Sheet for Healthcare Providers: IncredibleEmployment.be  This test is not yet approved or cleared by the [place] FDA and has been authorized for detection and/or diagnosis of SARS-CoV-2 by FDA under an Emergency Use Authorization (EUA). This EUA will remain in effect (meaning this test can be used) for the duration of the COVID-19 declaration under Section 564(b)(1) of the Act, 21 U.S.C. section 360bbb-3(b)(1), unless the authorization is terminated or revoked.  Performed at [facility], [address], [place], [place] [address]   Blood culture (routine x 2)     Status: None (Preliminary result)   Collection Time: 12/25/20  8:25  PM   Specimen: BLOOD  Result Value Ref Range Status   Specimen Description BLOOD RIGHT ANTECUBITAL  Final   Special Requests   Final    BOTTLES DRAWN AEROBIC AND ANAEROBIC Blood Culture adequate volume   Culture   Final    NO GROWTH 4 DAYS Performed at [facility], [address] [place], [place] [address]    Report Status PENDING  Incomplete  Resp Panel by RT-PCR (Flu A&B, Covid) Nasopharyngeal Swab     Status: None   Collection Time: 12/25/20  9:14 PM   Specimen: Nasopharyngeal Swab; Nasopharyngeal(NP) swabs in vial transport medium  Result Value Ref Range Status   SARS Coronavirus 2 by RT PCR NEGATIVE NEGATIVE Final    Comment: (NOTE) SARS-CoV-2 target nucleic acids are NOT DETECTED.  The SARS-CoV-2 RNA is generally detectable in upper  respiratory specimens during the acute phase of infection. The lowest concentration of SARS-CoV-2 viral copies this assay can detect is 138 copies/mL. A negative result does not preclude SARS-Cov-2 infection and should not be used as the sole basis for treatment or other patient management decisions. A negative result may occur with  improper specimen collection/handling, submission of specimen other than nasopharyngeal swab, presence of viral mutation(s) within the areas targeted by this assay, and inadequate number of viral copies(<138 copies/mL). A negative result must be combined with clinical observations, patient history, and epidemiological information. The expected result is Negative.  Fact Sheet for Patients:  EntrepreneurPulse.com.au  Fact Sheet for Healthcare Providers:  IncredibleEmployment.be  This test is no t yet approved or cleared by the [place] FDA and  has been authorized for detection and/or diagnosis of SARS-CoV-2 by FDA under an Emergency Use Authorization (EUA). This EUA will remain  in effect (meaning this test can be used) for the duration of the COVID-19 declaration under Section 564(b)(1) of the Act, 21 U.S.C.section 360bbb-3(b)(1), unless the authorization is terminated  or revoked sooner.       Influenza A by PCR NEGATIVE NEGATIVE Final   Influenza B by PCR NEGATIVE NEGATIVE Final    Comment: (NOTE) The Xpert Xpress SARS-CoV-2/FLU/RSV plus assay is intended as an aid in the diagnosis of influenza from Nasopharyngeal swab specimens and should not be used as a sole basis for treatment. Nasal washings and aspirates are unacceptable for Xpert Xpress SARS-CoV-2/FLU/RSV testing.  Fact Sheet for Patients: EntrepreneurPulse.com.au  Fact Sheet for Healthcare Providers: IncredibleEmployment.be  This test is not yet approved or cleared by the [place] FDA and has been  authorized for detection and/or diagnosis of SARS-CoV-2 by FDA under an Emergency Use Authorization (EUA). This EUA will remain in effect (meaning this test can be used) for the duration of the COVID-19 declaration under Section 564(b)(1) of the Act, 21 U.S.C. section 360bbb-3(b)(1), unless the authorization is terminated or revoked.  Performed at [facility], [address] [place], [place] [address]   Blood culture (routine x 2)     Status: Abnormal   Collection Time: 12/25/20 10:30 PM   Specimen: BLOOD  Result Value Ref Range Status   Specimen Description   Final    BLOOD LEFT ANTECUBITAL Performed at [facility], [address] [place], [place] [address]    Special Requests   Final    BOTTLES DRAWN AEROBIC AND ANAEROBIC Blood Culture adequate volume Performed at [facility], [address] [place],  [address]    Culture  Setup Time   Final    [facility] POSITIVE COCCI  AEROBIC BOTTLE ONLY CRITICAL RESULT CALLED TO, READ BACK BY AND VERIFIED WITH: MORGAN HICKS AT 1844 ON 12/26/20 BY SS    Culture (A)  Final    STAPHYLOCOCCUS HOMINIS THE SIGNIFICANCE OF ISOLATING THIS ORGANISM FROM A SINGLE SET OF BLOOD CULTURES WHEN MULTIPLE SETS ARE DRAWN IS UNCERTAIN. PLEASE NOTIFY THE MICROBIOLOGY DEPARTMENT WITHIN ONE WEEK IF SPECIATION AND SENSITIVITIES ARE REQUIRED. Performed at [facility], [place] [address] [place], [place] [address]    Report Status 12/28/2020 FINAL  Final  Blood Culture ID Panel (Reflexed)     Status: Abnormal   Collection Time: 12/25/20 10:30 PM  Result Value Ref Range Status   Enterococcus faecalis NOT DETECTED NOT DETECTED Final   Enterococcus Faecium NOT DETECTED NOT DETECTED Final   Listeria monocytogenes NOT DETECTED NOT DETECTED Final   Staphylococcus species DETECTED (A) NOT DETECTED Final    Comment: CRITICAL RESULT CALLED TO, READ BACK BY AND VERIFIED WITH: MORGAN HICKS AT 1844 ON 12/26/20 BY SS     Staphylococcus aureus (BCID) NOT DETECTED NOT DETECTED Final   Staphylococcus epidermidis NOT DETECTED NOT DETECTED Final   Staphylococcus lugdunensis NOT DETECTED NOT DETECTED Final   Streptococcus species NOT DETECTED NOT DETECTED Final   Streptococcus agalactiae NOT DETECTED NOT DETECTED Final   Streptococcus pneumoniae NOT DETECTED NOT DETECTED Final   Streptococcus pyogenes NOT DETECTED NOT DETECTED Final   A.calcoaceticus-baumannii NOT DETECTED NOT DETECTED Final   Bacteroides fragilis NOT DETECTED NOT DETECTED Final   Enterobacterales NOT DETECTED NOT DETECTED Final   Enterobacter cloacae complex NOT DETECTED NOT DETECTED Final   Escherichia coli NOT DETECTED NOT DETECTED Final   Klebsiella aerogenes NOT DETECTED NOT DETECTED Final   Klebsiella oxytoca NOT DETECTED NOT DETECTED Final   Klebsiella pneumoniae NOT DETECTED NOT DETECTED Final   Proteus species NOT DETECTED NOT DETECTED Final   Salmonella species NOT DETECTED NOT DETECTED Final   Serratia marcescens NOT DETECTED NOT DETECTED Final   Haemophilus influenzae NOT DETECTED NOT DETECTED Final   Neisseria meningitidis NOT DETECTED NOT DETECTED Final   Pseudomonas aeruginosa NOT DETECTED NOT DETECTED Final   Stenotrophomonas maltophilia NOT DETECTED NOT DETECTED Final   Candida albicans NOT DETECTED NOT DETECTED Final   Candida auris NOT DETECTED NOT DETECTED Final   Candida glabrata NOT DETECTED NOT DETECTED Final   Candida krusei NOT DETECTED NOT DETECTED Final   Candida parapsilosis NOT DETECTED NOT DETECTED Final   Candida tropicalis NOT DETECTED NOT DETECTED Final   Cryptococcus neoformans/gattii NOT DETECTED NOT DETECTED Final    Comment: Performed at [facility], [address] [place], [place] [address]  Culture, sputum-assessment     Status: None   Collection Time: 12/26/20  6:24 AM   Specimen: Sputum  Result Value Ref Range Status   Specimen Description SPUTUM  Final   Special Requests NONE  Final    Sputum evaluation   Final    THIS SPECIMEN IS ACCEPTABLE FOR SPUTUM CULTURE Performed at [facility], [address] [place], [place] [address]    Report Status 12/26/2020 FINAL  Final  Culture, Respiratory w Gram Stain     Status: None (Preliminary result)   Collection Time: 12/26/20  6:24 AM   Specimen: SPU  Result Value Ref Range Status   Specimen Description   Final    SPUTUM Performed at [facility], [address], [place], [place] [address]    Special Requests   Final    NONE Reflexed from  [id number] Performed at [facility], [place], [place] [address]    Gram Stain   Final    MODERATE WBC PRESENT,BOTH PMN AND MONONUCLEAR MODERATE GRAM POSITIVE COCCI MODERATE GRAM VARIABLE ROD    Culture   Final    MODERATE STREPTOCOCCUS PNEUMONIAE SUSCEPTIBILITIES TO FOLLOW Performed at [facility], [place] [address], [place], [place] [address]    Report Status PENDING  Incomplete  CULTURE, BLOOD (ROUTINE X 2) w Reflex to ID Panel     Status: None (Preliminary result)   Collection Time: 12/27/20  5:30 AM   Specimen: BLOOD  Result Value Ref Range Status   Specimen Description BLOOD RIGHT FOREARM  Final   Special Requests   Final    BOTTLES DRAWN AEROBIC AND ANAEROBIC Blood Culture adequate volume   Culture   Final    NO GROWTH 2 DAYS Performed at [facility], [address], [place], [place] [address]    Report Status PENDING  Incomplete  CULTURE, BLOOD (ROUTINE X 2) w Reflex to ID Panel     Status: None (Preliminary result)   Collection Time: 12/27/20  5:36 AM   Specimen: BLOOD  Result Value Ref Range Status   Specimen Description BLOOD RIGHT ASSIST CONTROL  Final   Special Requests   Final    BOTTLES DRAWN AEROBIC AND ANAEROBIC Blood Culture results may not be optimal due to an inadequate volume of blood received in culture bottles   Culture   Final    NO GROWTH 2 DAYS Performed at [facility], [address], [place], [place] [address]    Report Status PENDING  Incomplete      Studies: CT CHEST WO CONTRAST  Result Date: 12/28/2020 CLINICAL DATA:  Respiratory failure.  Cough and SOB. EXAM: CT CHEST WITHOUT CONTRAST TECHNIQUE: Multidetector CT imaging of the chest was performed following the standard protocol without IV contrast. COMPARISON:  Chest radiograph 12/22/2020. FINDINGS: Cardiovascular: The heart size is normal. No pericardial effusion. Aortic atherosclerosis. Mediastinum/Nodes: Small thyroid nodules are identified. The largest is in the right upper lobe measuring 1.5 cm, image 4/2. This has been evaluated on previous imaging. (ref: J Am Coll Radiol. 2015 Feb;12(2): 143-50).See report from thyroid ultrasound 08/01/2019 insert trach Normal appearance of the esophagus. Lungs/Pleura: Small bilateral pleural effusions are identified. The left pleural effusion may be partially loculated. Bilateral upper and lower lobe ground-glass and airspace densities are identified compatible with multifocal pneumonia. Several thin walled cystic structures within the left upper lobe are noted measuring up to 1.5 cm and favored to represent pneumatoceles. Upper Abdomen: Small hiatal hernia. No acute findings within the imaged portions of the upper abdomen. Musculoskeletal: No chest wall mass or suspicious bone lesions identified. Mild thoracic degenerative disc disease. No acute or suspicious osseous findings. IMPRESSION: 1. Bilateral upper and lower lobe ground-glass and airspace densities are identified compatible with multifocal pneumonia. 2. Small bilateral pleural effusions. The left pleural effusion may be partially loculated. 3. Aortic atherosclerosis. Aortic Atherosclerosis (ICD10-I70.0). Electronically Signed   By: Kerby Moors M.D.   On: 12/28/2020 14:52    Scheduled Meds: . amLODipine  5 mg Oral Q supper  . apixaban  2.5 mg Oral BID  . ascorbic acid  250 mg Oral Daily  . benzonatate  200 mg  Oral Q8H  . budesonide (PULMICORT) nebulizer solution  0.5 mg Nebulization BID  . calcium citrate-vitamin D  1 tablet Oral Daily  . famotidine  20 mg Oral  Daily  . ferrous sulfate  325 mg Oral Q breakfast  . guaiFENesin  1,200 mg Oral BID  . insulin aspart  0-6 Units Subcutaneous TID WC  . ipratropium-albuterol  3 mL Nebulization TID  . losartan  100 mg Oral Daily  . multivitamin with minerals  1 tablet Oral Daily  . sertraline  50 mg Oral QHS    Continuous Infusions: . azithromycin 500 mg (12/28/20 2226)  . cefTRIAXone (ROCEPHIN)  IV 2 g (12/28/20 1403)     LOS: 4 days     Kayleen Memos, MD [facility] Pager [phone]  If 7PM-7AM, please contact night-coverage [email or web address] Password [facility] 12/29/2020, 11:32 AM

## 2020-12-29 NOTE — TOC Initial Note (Signed)
Transition of Care [facility]) - Initial/Assessment Note    Patient Details  Name: Anita Benjamin MRN: [id number] Date of Birth: 03-31-1934  Transition of Care [facility]) CM/SW Contact:    Candie Chroman, LCSW Phone Number: 12/29/2020, 10:40 AM  Clinical Narrative: CSW met with patient. Female family member at bedside. CSW introduced role and explained that PT recommendations would be discussed. Patient wants to consider home health closer to discharge. Provided list of CMS scores for agencies that serve her zip code. Made them aware of frequent difficulty with finding home health for [facility] Medicare. Discussed potential for outpatient PT in the event we cannot find an agency to accept referral. Patient stated she does not use DME at home. She is not on oxygen at home. Will follow for this potential need. No further concerns. CSW encouraged patient to contact CSW as needed. CSW will continue to follow patient for support and facilitate return home when stable.                 Expected Discharge Plan: [place] Barriers to Discharge: Continued Medical Work up   Patient Goals and CMS Choice   CMS Medicare.gov Compare Post Acute Care list provided to:: Patient (Family member at bedside.)    Expected Discharge Plan and Services Expected Discharge Plan: [place]     Post Acute Care Choice:  (TBD) Living arrangements for the past 2 months: Single Family Home                                      Prior Living Arrangements/Services Living arrangements for the past 2 months: Single Family Home Lives with:: Adult Children Patient language and need for interpreter reviewed:: Yes Do you feel safe going back to the place where you live?: Yes      Need for Family Participation in Patient Care: Yes (Comment) Care giver support system in place?: Yes (comment)   Criminal Activity/Legal Involvement Pertinent to Current Situation/Hospitalization: No - Comment as  needed  Activities of Daily Living Home Assistive Devices/Equipment: Eyeglasses ADL Screening (condition at time of admission) Patient's cognitive ability adequate to safely complete daily activities?: Yes Is the patient deaf or have difficulty hearing?: No Does the patient have difficulty seeing, even when wearing glasses/contacts?: No Does the patient have difficulty concentrating, remembering, or making decisions?: No Patient able to express need for assistance with ADLs?: Yes Does the patient have difficulty dressing or bathing?: No Independently performs ADLs?: Yes (appropriate for developmental age) Does the patient have difficulty walking or climbing stairs?: No Weakness of Legs: None Weakness of Arms/Hands: None  Permission Sought/Granted Permission sought to share information with : Facility Sales executive granted to share info w Relationship: Family member at bedside     Emotional Assessment Appearance:: Appears stated age Attitude/Demeanor/Rapport: Engaged,Gracious Affect (typically observed): Accepting,Appropriate,Calm,Pleasant Orientation: : Oriented to Self,Oriented to Place,Oriented to  Time,Oriented to Situation Alcohol / Substance Use: Not Applicable Psych Involvement: No (comment)  Admission diagnosis:  Hypoxia [R09.02] CAP (community acquired pneumonia) [J18.9] Community acquired pneumonia, unspecified laterality [J18.9] Patient Active Problem List   Diagnosis Date Noted  . CAP (community acquired pneumonia) 12/25/2020  . Breast cancer ([place]) 06/09/2020  . Diarrhea 04/11/2019  . Hair loss 06/10/2017  . Fatigue 02/19/2017  . Chest pain 02/12/2017  . Encounter for anticoagulation discussion  and counseling 10/05/2016  . Cough 10/01/2016  . Gas 10/27/2015  . Dysuria 09/17/2015  . Left hip pain 07/21/2015  . Neck pain 06/09/2015  . Syncope 03/25/2015  . Left wrist pain 03/25/2015  . SOB (shortness of breath)  12/01/2014  . Abdominal discomfort 12/01/2014  . Atrial flutter ([place]) 11/15/2014  . Stress 11/08/2014  . Difficulty sleeping 11/08/2014  . Health care maintenance 11/08/2014  . Adjustment disorder 10/14/2014  . Tachycardia 07/17/2014  . Hyperkalemia 07/17/2014  . History of colonic polyps 03/09/2014  . Decreased sense of taste 03/09/2014  . Abnormal mammogram, unspecified 07/31/2013  . Abnormal mammogram 01/20/2013  . Essential hypertension 10/29/2012  . Hypercholesterolemia 10/29/2012  . Leukopenia 10/29/2012   PCP:  Einar Pheasant, MD Pharmacy:   [place], [place] [address] Phone: [phone] Fax: [phone]     Social Determinants of Health (SDOH) Interventions    Readmission Risk Interventions No flowsheet data found.

## 2020-12-30 ENCOUNTER — Other Ambulatory Visit: Payer: Medicare Other

## 2020-12-30 DIAGNOSIS — J189 Pneumonia, unspecified organism: Secondary | ICD-10-CM | POA: Diagnosis not present

## 2020-12-30 LAB — BASIC METABOLIC PANEL
Anion gap: 8 (ref 5–15)
BUN: 10 mg/dL (ref 8–23)
CO2: 27 mmol/L (ref 22–32)
Calcium: 8.4 mg/dL — ABNORMAL LOW (ref 8.9–10.3)
Chloride: 101 mmol/L (ref 98–111)
Creatinine, Ser: 0.56 mg/dL (ref 0.44–1.00)
GFR, Estimated: >60 mL/min (ref >60)
Glucose, Bld: 111 mg/dL — ABNORMAL HIGH (ref 70–99)
Potassium: 4.2 mmol/L (ref 3.5–5.1)
Sodium: 136 mmol/L (ref 135–145)

## 2020-12-30 LAB — CULTURE, BLOOD (ROUTINE X 2)
Culture: NO GROWTH
Special Requests: ADEQUATE

## 2020-12-30 LAB — GLUCOSE, CAPILLARY
Glucose-Capillary: 103 mg/dL — ABNORMAL HIGH (ref 70–99)
Glucose-Capillary: 133 mg/dL — ABNORMAL HIGH (ref 70–99)
Glucose-Capillary: 106 mg/dL — ABNORMAL HIGH (ref 70–99)
Glucose-Capillary: 123 mg/dL — ABNORMAL HIGH (ref 70–99)

## 2020-12-30 LAB — CBC
HCT: 27.7 % — ABNORMAL LOW (ref 36.0–46.0)
Hemoglobin: 9.5 g/dL — ABNORMAL LOW (ref 12.0–15.0)
MCH: 31.6 pg (ref 26.0–34.0)
MCHC: 34.3 g/dL (ref 30.0–36.0)
MCV: 92 fL (ref 80.0–100.0)
Platelets: 443 10*3/uL — ABNORMAL HIGH (ref 150–400)
RBC: 3.01 MIL/uL — ABNORMAL LOW (ref 3.87–5.11)
RDW: 13.8 % (ref 11.5–15.5)
WBC: 12.1 10*3/uL — ABNORMAL HIGH (ref 4.0–10.5)
nRBC: 0 % (ref 0.0–0.2)

## 2020-12-30 LAB — PROCALCITONIN: Procalcitonin: 0.17 ng/mL

## 2020-12-30 LAB — PHOSPHORUS: Phosphorus: 3.2 mg/dL (ref 2.5–4.6)

## 2020-12-30 LAB — MAGNESIUM: Magnesium: 1.5 mg/dL — ABNORMAL LOW (ref 1.7–2.4)

## 2020-12-30 MED ORDER — IPRATROPIUM-ALBUTEROL 0.5-2.5 (3) MG/3ML IN SOLN
3.0000 mL | Freq: Two times a day (BID) | RESPIRATORY_TRACT | Status: DC
  Administered 2020-12-30 – 2020-12-31 (×2): 3 mL via RESPIRATORY_TRACT
  Filled 2020-12-30 (×2): qty 3

## 2020-12-30 MED ORDER — MAGNESIUM SULFATE 4 GM/100ML IV SOLN
4.0000 g | Freq: Once | INTRAVENOUS | Status: AC
  Administered 2020-12-30: 4 g via INTRAVENOUS
  Filled 2020-12-30: qty 100

## 2020-12-30 NOTE — Progress Notes (Signed)
PROGRESS NOTE  Anita Benjamin KWI:[id number] DOB: 1934/06/11 DOA: 12/25/2020 PCP: Einar Pheasant, MD  HPI/Recap of past 24 hours: Anita Benjamin is a 87 y.o. Caucasian female with medical history significant for hypertension, dyslipidemia, paroxysmal atrial fibrillation on Eliquis, GERD and anemia, presented to the ER with acute onset of worsening dyspnea with associated cough initially productive of dark yellowish sputum and later with inability to expectorate.  Her symptoms have been going on for about a week with associated generalized weakness, fever and chills.  She was febrile on the day of presentation with T-max of 102, hypoxic with O2 saturation in the 70s.  Daughter.  She was brought to the ED for further evaluation.  Reported recent diagnosis of RSV at urgent care.  COVID-19 screening test and influenza were negative.  1/2 bottles with positive blood culture drawn on 12/25/2020, Staph Hominis which is thought to be contaminant.  Work-up revealed multifocal streptococcal pneumoniae pneumonia for which she is currently on Rocephin and azithromycin, day #4.  She had a repeated CT chest done on 12/28/2020 which shows findings concerning for possible left-sided loculated effusion, need to rule out empyema.  Pulmonary Dr. Mortimer Fries, consulted.  12/30/20: Patient was seen at her bedside.  She states she continues to feel better.  Cough and dyspnea are improving.  Her daughter is at bedside.  Answered questions to the best of my ability.  Assessment/Plan: Active Problems:   CAP (community acquired pneumonia)   Persistent sepsis secondary to streptococcus pneumoniae pneumonia, POA. She presented with fever with T-max 100.9, respiration rate 25, productive cough and evidence of pneumonia on chest x-ray. Recently diagnosed with RSV at urgent care. Sputum culture 12/26/20 grew moderate Streptococcus pneumoniae She was started on Rocephin and azithromycin empirically in the ED, day #5. CT chest with no  contrast obtained to further assess which shows evidence of multifocal pneumonia, findings concerning for possible left-sided loculated effusion, need to rule out empyema body fluid culture negative to date, continue to follow.   Seen by pulmonary Dr. Mortimer Fries, signed off.  Possible left loculated pleural effusion seen on CT chest in the setting of strep pneumoniae CAP, POA. Management per above.  Paroxysmal A. fib with RVR, suspect driven by her lung physiology. She went into A. fib with RVR 12/29/2020 She is currently on Norvasc 5 mg daily. Added IV Lopressor as needed with parameters Continue Eliquis for primary CVA prevention. Continue on telemetry.  Acute hypoxic respiratory failure secondary to above Not on oxygen supplementation at baseline She is currently on 3 L O2 saturation in the low 90s. Continue to wean off oxygen supplementation as tolerated. Home O2 evaluation prior to DC  Resolved Acute hypovolemic hyponatremia She presented with serum sodium of 127, improved with IV fluid hydration normal saline. Repeated serum sodium 136 She received IV fluid hydration normal saline. Continue to encourage increase in oral intake.  Type 2 diabetes with hyperglycemia Hemoglobin A1c 5.9 Continue ISS, avoid hypoglycemia.  Iron deficiency anemia/Chronic normocytic anemia Hemoglobin is currently 10, at baseline. Iron studies suggestive of iron deficiency. No overt bleeding Continue ferrous sulfate  Essential hypertension BP is currently at goal. Continue Norvasc 5 mg daily and losartan 100 mg daily.  Chronic anxiety/depression Stable Continue home Zoloft  Generalized weakness/physical debility likely contributed by acute illness, sepsis. PT OT recommended home PT OT TOC consulted to arrange home health services Continue Fall precautions   Code Status: Full code.  Family Communication: Updated her daughter at bedside.  All questions answered to the  best of my  ability.  Disposition Plan: Likely will discharge to home on 12/31/2020.  Consultants:  Pulmonary.  Procedures:  None.  Antimicrobials:  Rocephin, completed on 12/30/2020  Azithromycin, completed on 12/30/2020.  DVT prophylaxis:  Eliquis  Status is: Inpatient    Dispo:  Patient From: Home  Planned Disposition: Home with Health Care Svc  Anticipated date of discharge 12/31/2020.  Medically stable for discharge: No, ongoing management of sepsis secondary to community-acquired pneumonia, acute hypoxic respiratory failure.          Objective: Vitals:   12/30/20 0725 12/30/20 1059 12/30/20 1147 12/30/20 1610  BP: 135/64  (!) 115/55 128/66  Pulse: 85  73 79  Resp: 16  16 17   Temp: 99.2 F (37.3 C)  99 F (37.2 C) 98.1 F (36.7 C)  TempSrc: Oral  Oral Oral  SpO2: 90% 95% 93% 96%  Weight:      Height:        Intake/Output Summary (Last 24 hours) at 12/30/2020 1659 Last data filed at 12/30/2020 1510 Gross per 24 hour  Intake 520.5 ml  Output 800 ml  Net -279.5 ml   Filed Weights   12/26/20 0026  Weight: 59.5 kg    Exam:  . General: 87 y.o. year-old female frail-appearing in no acute distress.  She is alert and oriented x3.   . Cardiovascular: Regular rate and rhythm no rubs or gallops. Marland Kitchen Respiratory: Clear to auscultation with no wheezes noted.  Poor inspiratory effort.   . Abdomen: Soft nontender normal bowel sounds present. . Musculoskeletal: No lower extremity edema bilaterally. . Skin: No ulcerative lesions noted.   Marland Kitchen Psychiatry: Mood is appropriate for condition and setting.   Data Reviewed: CBC: Recent Labs  Lab 12/25/20 2025 12/26/20 0505 12/27/20 0530 12/28/20 0437 12/29/20 0551 12/30/20 0432  WBC 10.1 9.5 13.8* 11.9* 12.7* 12.1*  NEUTROABS 9.1*  --   --  10.2*  --   --   HGB 10.8* 11.1* 10.0* 10.2* 10.5* 9.5*  HCT 31.2* 33.0* 29.2* 30.7* 30.2* 27.7*  MCV 91.5 92.7 92.4 93.9 92.1 92.0  PLT 320 342 367 413* 393 443*   Basic  Metabolic Panel: Recent Labs  Lab 12/25/20 2025 12/26/20 0505 12/27/20 0530 12/29/20 0551 12/30/20 0432  NA 127* 132* 137 133* 136  K 4.3 4.0 4.5 4.3 4.2  CL 92* 100 107 98 101  CO2 25 21* 24 27 27   GLUCOSE 126* 205* 125* 100* 111*  BUN 19 17 17 8 10   CREATININE 1.16* 1.00 0.71 0.61 0.56  CALCIUM 9.0 8.9 8.9 8.6* 8.4*  MG  --   --  2.1  --  1.5*  PHOS  --   --  2.8  --  3.2   GFR: Estimated Creatinine Clearance: 49.2 mL/min (by C-G formula based on SCr of 0.56 mg/dL). Liver Function Tests: Recent Labs  Lab 12/26/20 0505 12/29/20 1814  AST 38  --   ALT 28  --   ALKPHOS 73  --   BILITOT 0.6  --   PROT 6.3* 6.1*  ALBUMIN 2.6*  --    No results for input(s): LIPASE, AMYLASE in the last 168 hours. No results for input(s): AMMONIA in the last 168 hours. Coagulation Profile: No results for input(s): INR, PROTIME in the last 168 hours. Cardiac Enzymes: No results for input(s): CKTOTAL, CKMB, CKMBINDEX, TROPONINI in the last 168 hours. BNP (last 3 results) No results for input(s): PROBNP in the last 8760 hours. HbA1C: Recent Labs  12/28/20 0437  HGBA1C 5.9*   CBG: Recent Labs  Lab 12/29/20 1202 12/29/20 2306 12/30/20 0744 12/30/20 1145 12/30/20 1622  GLUCAP 113* 131* 103* 133* 106*   Lipid Profile: No results for input(s): CHOL, HDL, LDLCALC, TRIG, CHOLHDL, LDLDIRECT in the last 72 hours. Thyroid Function Tests: No results for input(s): TSH, T4TOTAL, FREET4, T3FREE, THYROIDAB in the last 72 hours. Anemia Panel: No results for input(s): VITAMINB12, FOLATE, FERRITIN, TIBC, IRON, RETICCTPCT in the last 72 hours. Urine analysis:    Component Value Date/Time   COLORURINE YELLOW (A) 06/27/2020 1658   APPEARANCEUR CLEAR (A) 06/27/2020 1658   LABSPEC 1.014 06/27/2020 1658   PHURINE 6.0 06/27/2020 1658   GLUCOSEU NEGATIVE 06/27/2020 1658   GLUCOSEU NEGATIVE 10/09/2014 1641   HGBUR NEGATIVE 06/27/2020 [place] 06/27/2020 1658   BILIRUBINUR  neg 09/09/2015 1049   KETONESUR NEGATIVE 06/27/2020 1658   PROTEINUR NEGATIVE 06/27/2020 1658   UROBILINOGEN 0.2 09/09/2015 1049   UROBILINOGEN 0.2 10/09/2014 1641   NITRITE NEGATIVE 06/27/2020 1658   LEUKOCYTESUR NEGATIVE 06/27/2020 1658   Sepsis Labs: @LABRCNTIP (procalcitonin:4,lacticidven:4)  ) Recent Results (from the past 240 hour(s))  Resp Panel by RT-PCR (Flu A&B, Covid) Nasopharyngeal Swab     Status: None   Collection Time: 12/22/20 11:11 AM   Specimen: Nasopharyngeal Swab; Nasopharyngeal(NP) swabs in vial transport medium  Result Value Ref Range Status   SARS Coronavirus 2 by RT PCR NEGATIVE NEGATIVE Final    Comment: (NOTE) SARS-CoV-2 target nucleic acids are NOT DETECTED.  The SARS-CoV-2 RNA is generally detectable in upper respiratory specimens during the acute phase of infection. The lowest concentration of SARS-CoV-2 viral copies this assay can detect is 138 copies/mL. A negative result does not preclude SARS-Cov-2 infection and should not be used as the sole basis for treatment or other patient management decisions. A negative result may occur with  improper specimen collection/handling, submission of specimen other than nasopharyngeal swab, presence of viral mutation(s) within the areas targeted by this assay, and inadequate number of viral copies(<138 copies/mL). A negative result must be combined with clinical observations, patient history, and epidemiological information. The expected result is Negative.  Fact Sheet for Patients:  EntrepreneurPulse.com.au  Fact Sheet for Healthcare Providers:  IncredibleEmployment.be  This test is no t yet approved or cleared by the [place] FDA and  has been authorized for detection and/or diagnosis of SARS-CoV-2 by FDA under an Emergency Use Authorization (EUA). This EUA will remain  in effect (meaning this test can be used) for the duration of the COVID-19 declaration under  Section 564(b)(1) of the Act, 21 U.S.C.section 360bbb-3(b)(1), unless the authorization is terminated  or revoked sooner.       Influenza A by PCR NEGATIVE NEGATIVE Final   Influenza B by PCR NEGATIVE NEGATIVE Final    Comment: (NOTE) The Xpert Xpress SARS-CoV-2/FLU/RSV plus assay is intended as an aid in the diagnosis of influenza from Nasopharyngeal swab specimens and should not be used as a sole basis for treatment. Nasal washings and aspirates are unacceptable for Xpert Xpress SARS-CoV-2/FLU/RSV testing.  Fact Sheet for Patients: EntrepreneurPulse.com.au  Fact Sheet for Healthcare Providers: IncredibleEmployment.be  This test is not yet approved or cleared by the [place] FDA and has been authorized for detection and/or diagnosis of SARS-CoV-2 by FDA under an Emergency Use Authorization (EUA). This EUA will remain in effect (meaning this test can be used) for the duration of the COVID-19 declaration under Section 564(b)(1) of the Act, 21 U.S.C. section 360bbb-3(b)(1),  unless the authorization is terminated or revoked.  Performed at [facility], [address], [place], [place] [address]   Blood culture (routine x 2)     Status: None   Collection Time: 12/25/20  8:25 PM   Specimen: BLOOD  Result Value Ref Range Status   Specimen Description BLOOD RIGHT ANTECUBITAL  Final   Special Requests   Final    BOTTLES DRAWN AEROBIC AND ANAEROBIC Blood Culture adequate volume   Culture   Final    NO GROWTH 5 DAYS Performed at [facility], [address] [place], [place] [address]    Report Status 12/30/2020 FINAL  Final  Resp Panel by RT-PCR (Flu A&B, Covid) Nasopharyngeal Swab     Status: None   Collection Time: 12/25/20  9:14 PM   Specimen: Nasopharyngeal Swab; Nasopharyngeal(NP) swabs in vial transport medium  Result Value Ref Range Status   SARS Coronavirus 2 by RT PCR NEGATIVE NEGATIVE Final    Comment:  (NOTE) SARS-CoV-2 target nucleic acids are NOT DETECTED.  The SARS-CoV-2 RNA is generally detectable in upper respiratory specimens during the acute phase of infection. The lowest concentration of SARS-CoV-2 viral copies this assay can detect is 138 copies/mL. A negative result does not preclude SARS-Cov-2 infection and should not be used as the sole basis for treatment or other patient management decisions. A negative result may occur with  improper specimen collection/handling, submission of specimen other than nasopharyngeal swab, presence of viral mutation(s) within the areas targeted by this assay, and inadequate number of viral copies(<138 copies/mL). A negative result must be combined with clinical observations, patient history, and epidemiological information. The expected result is Negative.  Fact Sheet for Patients:  EntrepreneurPulse.com.au  Fact Sheet for Healthcare Providers:  IncredibleEmployment.be  This test is no t yet approved or cleared by the [place] FDA and  has been authorized for detection and/or diagnosis of SARS-CoV-2 by FDA under an Emergency Use Authorization (EUA). This EUA will remain  in effect (meaning this test can be used) for the duration of the COVID-19 declaration under Section 564(b)(1) of the Act, 21 U.S.C.section 360bbb-3(b)(1), unless the authorization is terminated  or revoked sooner.       Influenza A by PCR NEGATIVE NEGATIVE Final   Influenza B by PCR NEGATIVE NEGATIVE Final    Comment: (NOTE) The Xpert Xpress SARS-CoV-2/FLU/RSV plus assay is intended as an aid in the diagnosis of influenza from Nasopharyngeal swab specimens and should not be used as a sole basis for treatment. Nasal washings and aspirates are unacceptable for Xpert Xpress SARS-CoV-2/FLU/RSV testing.  Fact Sheet for Patients: EntrepreneurPulse.com.au  Fact Sheet for Healthcare  Providers: IncredibleEmployment.be  This test is not yet approved or cleared by the [place] FDA and has been authorized for detection and/or diagnosis of SARS-CoV-2 by FDA under an Emergency Use Authorization (EUA). This EUA will remain in effect (meaning this test can be used) for the duration of the COVID-19 declaration under Section 564(b)(1) of the Act, 21 U.S.C. section 360bbb-3(b)(1), unless the authorization is terminated or revoked.  Performed at [facility], [address] [place], [place] [address]   Blood culture (routine x 2)     Status: Abnormal   Collection Time: 12/25/20 10:30 PM   Specimen: BLOOD  Result Value Ref Range Status   Specimen Description   Final    BLOOD LEFT ANTECUBITAL Performed at [facility], [address], [place], [place] [address]    Special Requests  Final    BOTTLES DRAWN AEROBIC AND ANAEROBIC Blood Culture adequate volume Performed at [facility], [address] [place], [place] [address]    Culture  Setup Time   Final    GRAM POSITIVE COCCI AEROBIC BOTTLE ONLY CRITICAL RESULT CALLED TO, READ BACK BY AND VERIFIED WITH: MORGAN HICKS AT 1844 ON 12/26/20 BY SS    Culture (A)  Final    STAPHYLOCOCCUS HOMINIS THE SIGNIFICANCE OF ISOLATING THIS ORGANISM FROM A SINGLE SET OF BLOOD CULTURES WHEN MULTIPLE SETS ARE DRAWN IS UNCERTAIN. PLEASE NOTIFY THE MICROBIOLOGY DEPARTMENT WITHIN ONE WEEK IF SPECIATION AND SENSITIVITIES ARE REQUIRED. Performed at [facility], [place] [address] [place], [place] [address]    Report Status 12/28/2020 FINAL  Final  Blood Culture ID Panel (Reflexed)     Status: Abnormal   Collection Time: 12/25/20 10:30 PM  Result Value Ref Range Status   Enterococcus faecalis NOT DETECTED NOT DETECTED Final   Enterococcus Faecium NOT DETECTED NOT DETECTED Final   Listeria monocytogenes NOT DETECTED NOT DETECTED Final   Staphylococcus species DETECTED (A) NOT  DETECTED Final    Comment: CRITICAL RESULT CALLED TO, READ BACK BY AND VERIFIED WITH: MORGAN HICKS AT 1844 ON 12/26/20 BY SS    Staphylococcus aureus (BCID) NOT DETECTED NOT DETECTED Final   Staphylococcus epidermidis NOT DETECTED NOT DETECTED Final   Staphylococcus lugdunensis NOT DETECTED NOT DETECTED Final   Streptococcus species NOT DETECTED NOT DETECTED Final   Streptococcus agalactiae NOT DETECTED NOT DETECTED Final   Streptococcus pneumoniae NOT DETECTED NOT DETECTED Final   Streptococcus pyogenes NOT DETECTED NOT DETECTED Final   A.calcoaceticus-baumannii NOT DETECTED NOT DETECTED Final   Bacteroides fragilis NOT DETECTED NOT DETECTED Final   Enterobacterales NOT DETECTED NOT DETECTED Final   Enterobacter cloacae complex NOT DETECTED NOT DETECTED Final   Escherichia coli NOT DETECTED NOT DETECTED Final   Klebsiella aerogenes NOT DETECTED NOT DETECTED Final   Klebsiella oxytoca NOT DETECTED NOT DETECTED Final   Klebsiella pneumoniae NOT DETECTED NOT DETECTED Final   Proteus species NOT DETECTED NOT DETECTED Final   Salmonella species NOT DETECTED NOT DETECTED Final   Serratia marcescens NOT DETECTED NOT DETECTED Final   Haemophilus influenzae NOT DETECTED NOT DETECTED Final   Neisseria meningitidis NOT DETECTED NOT DETECTED Final   Pseudomonas aeruginosa NOT DETECTED NOT DETECTED Final   Stenotrophomonas maltophilia NOT DETECTED NOT DETECTED Final   Candida albicans NOT DETECTED NOT DETECTED Final   Candida auris NOT DETECTED NOT DETECTED Final   Candida glabrata NOT DETECTED NOT DETECTED Final   Candida krusei NOT DETECTED NOT DETECTED Final   Candida parapsilosis NOT DETECTED NOT DETECTED Final   Candida tropicalis NOT DETECTED NOT DETECTED Final   Cryptococcus neoformans/gattii NOT DETECTED NOT DETECTED Final    Comment: Performed at [facility], [address] [place], [place] [address]  Culture, sputum-assessment     Status: None   Collection Time: 12/26/20   6:24 AM   Specimen: Sputum  Result Value Ref Range Status   Specimen Description SPUTUM  Final   Special Requests NONE  Final   Sputum evaluation   Final    THIS SPECIMEN IS ACCEPTABLE FOR SPUTUM CULTURE Performed at [facility], [address] [place],  [address]    Report Status 12/26/2020 FINAL  Final  Culture, Respiratory w Gram Stain     Status: None   Collection Time: 12/26/20  6:24 AM   Specimen: SPU  Result Value Ref  Range Status   Specimen Description   Final    SPUTUM Performed at [facility], [address] [place], [place] [address]    Special Requests   Final    NONE Reflexed from [id number] Performed at [facility], [place], [place] [address]    Gram Stain   Final    MODERATE WBC PRESENT,BOTH PMN AND MONONUCLEAR MODERATE GRAM POSITIVE COCCI MODERATE GRAM VARIABLE ROD Performed at [facility], [place] [address] [place], [place] [address]    Culture MODERATE STREPTOCOCCUS PNEUMONIAE  Final   Report Status 12/29/2020 FINAL  Final   Organism ID, Bacteria STREPTOCOCCUS PNEUMONIAE  Final      Susceptibility   Streptococcus pneumoniae - MIC*    ERYTHROMYCIN >=8 RESISTANT Resistant     LEVOFLOXACIN 0.5 SENSITIVE Sensitive     VANCOMYCIN 0.5 SENSITIVE Sensitive     PENICILLIN (meningitis) 1 RESISTANT Resistant     PENO - penicillin 1      PENICILLIN (non-meningitis) 1 SENSITIVE Sensitive     PENICILLIN (oral) 1 INTERMEDIATE Intermediate     CEFTRIAXONE (non-meningitis) 1 SENSITIVE Sensitive     CEFTRIAXONE (meningitis) 1 INTERMEDIATE Intermediate     * MODERATE STREPTOCOCCUS PNEUMONIAE  CULTURE, BLOOD (ROUTINE X 2) w Reflex to ID Panel     Status: None (Preliminary result)   Collection Time: 12/27/20  5:30 AM   Specimen: BLOOD  Result Value Ref Range Status   Specimen Description BLOOD RIGHT FOREARM  Final   Special Requests   Final    BOTTLES DRAWN AEROBIC AND ANAEROBIC Blood Culture adequate volume    Culture   Final    NO GROWTH 3 DAYS Performed at [facility], [address], [place], [place] [address]    Report Status PENDING  Incomplete  CULTURE, BLOOD (ROUTINE X 2) w Reflex to ID Panel     Status: None (Preliminary result)   Collection Time: 12/27/20  5:36 AM   Specimen: BLOOD  Result Value Ref Range Status   Specimen Description BLOOD RIGHT ASSIST CONTROL  Final   Special Requests   Final    BOTTLES DRAWN AEROBIC AND ANAEROBIC Blood Culture results may not be optimal due to an inadequate volume of blood received in culture bottles   Culture   Final    NO GROWTH 3 DAYS Performed at [facility], [address] [place], [place] [address]    Report Status PENDING  Incomplete  Body fluid culture w Gram Stain     Status: None (Preliminary result)   Collection Time: 12/29/20  4:43 PM   Specimen: PATH Cytology Pleural fluid  Result Value Ref Range Status   Specimen Description   Final    PLEURAL Performed at [facility], [address], [place], [place] [address]    Special Requests   Final    PLEURAL Performed at [facility], [address] [place], [place] [address]    Gram Stain PENDING  Incomplete   Culture   Final    NO GROWTH < 12 HOURS Performed at [facility], [place] [address], [place],  [address]    Report Status PENDING  Incomplete      Studies: US THORACENTESIS ASP PLEURAL SPACE W/IMG GUIDE  Result Date: 12/30/2020 INDICATION: Patient history of acute hypoxic respiratory failure with small loculated bilateral pleural effusions. Request is for therapeutic and diagnostic thoracentesis EXAM: ULTRASOUND GUIDED THERAPEUTIC AND DIAGNOSTIC THORACENTESIS MEDICATIONS: Lidocaine 1% 10  mL COMPLICATIONS: None immediate. PROCEDURE: An ultrasound guided thoracentesis was thoroughly discussed with the patient and questions answered. The benefits, risks, alternatives and complications were also discussed. The patient  understands and wishes to proceed with the procedure. Written consent was obtained. Ultrasound was performed to localize and mark an adequate pocket of fluid in the left chest. The area was then prepped and draped in the normal sterile fashion. 1% Lidocaine was used for local anesthesia. Under ultrasound guidance a 6 Fr Safe-T-Centesis catheter was introduced. Thoracentesis was performed. The catheter was removed and a dressing applied. FINDINGS: Ultrasound chest demonstrates a left-sided loculated and small right-sided pleural a total of approximately 100 mL of serosanguineous fluid was removed. Samples were sent to the laboratory as requested by the clinical team. IMPRESSION: Successful ultrasound guided left-sided therapeutic and diagnostic thoracentesis yielding 100 mL of pleural fluid. Read by: Rushie Nyhan, NP Electronically Signed   By: Jerilynn Mages.  Shick M.D.   On: 12/29/2020 17:17    Scheduled Meds: . amLODipine  5 mg Oral Q supper  . apixaban  2.5 mg Oral BID  . ascorbic acid  250 mg Oral Daily  . benzonatate  200 mg Oral Q8H  . budesonide (PULMICORT) nebulizer solution  0.5 mg Nebulization BID  . calcium citrate-vitamin D  1 tablet Oral Daily  . famotidine  20 mg Oral Daily  . ferrous sulfate  325 mg Oral Q breakfast  . insulin aspart  0-6 Units Subcutaneous TID WC  . ipratropium-albuterol  3 mL Nebulization BID  . losartan  100 mg Oral Daily  . multivitamin with minerals  1 tablet Oral Daily  . sertraline  50 mg Oral QHS    Continuous Infusions:    LOS: 5 days     Kayleen Memos, MD [facility] Pager [phone]  If 7PM-7AM, please contact night-coverage [email or web address] Password [facility] 12/30/2020, 4:59 PM

## 2020-12-30 NOTE — Progress Notes (Signed)
Physical Therapy Treatment Patient Details Name: Anita Benjamin MRN: [id number] DOB: 07/30/1934 Today's Date: 12/30/2020    History of Present Illness 87 y.o. Caucasian female with medical history significant for hypertension, dyslipidemia, atrial fibrillation, GERD and anemia, presented to the ER with acute onset of worsening dyspnea with associated cough initially productive of dark yellowish sputum.    PT Comments    Patient received sitting up on the side of bed with daughter present. Patient is agreeable to PT session. She reports she is cold, but otherwise feeling OK. Patient HR in 80s and O2 sats in mid 90s on 3 lpm. Patient requires min guard for sit to stand and ambulated 120 feet with RW. Does not feel comfortable not using RW at this point due to weakness. Patient ambulates with slow, guarded gait. Mild sob with mobility. She will continue to benefit from skilled PT while here to improve strength and functional independence for safe return home at discharge.       Follow Up Recommendations  Home health PT;Supervision for mobility/OOB     Equipment Recommendations  Rolling walker with 5" wheels    Recommendations for Other Services       Precautions / Restrictions Precautions Precautions: Fall Restrictions Weight Bearing Restrictions: No    Mobility  Bed Mobility               General bed mobility comments: patient received sitting up on the side of bed. Daughter present in room.    Transfers Overall transfer level: Needs assistance Equipment used: Rolling walker (2 wheeled) Transfers: Sit to/from Stand Sit to Stand: Min assist            Ambulation/Gait Ambulation/Gait assistance: Min guard Gait Distance (Feet): 120 Feet Assistive device: Rolling walker (2 wheeled) Gait Pattern/deviations: Step-through pattern;Decreased step length - right;Decreased step length - left;Narrow base of support Gait velocity: decr   General Gait Details: Patient mildly  sob with ambulation. Hesitant with gait. Requires use of RW and min guard. Patient HR remained in 80s throughout session. O2 sats at end of walk at 91% on 3 lpm.   Stairs             Wheelchair Mobility    Modified Rankin (Stroke Patients Only)       Balance Overall balance assessment: Needs assistance Sitting-balance support: Feet supported Sitting balance-Leahy Scale: Good     Standing balance support: Bilateral upper extremity supported;During functional activity Standing balance-Leahy Scale: Fair Standing balance comment: reliant on RW and min guard for safety with mobility                            Cognition Arousal/Alertness: Awake/alert Behavior During Therapy: WFL for tasks assessed/performed Overall Cognitive Status: Within Functional Limits for tasks assessed                                        Exercises      General Comments        Pertinent Vitals/Pain Pain Assessment: No/denies pain    Home Living                      Prior Function            PT Goals (current goals can now be found in the care plan section) Acute Rehab PT Goals Patient Stated  Goal: go home PT Goal Formulation: With patient/family Time For Goal Achievement: 01/10/21 Potential to Achieve Goals: Good Progress towards PT goals: Progressing toward goals    Frequency    Min 2X/week      PT Plan      Co-evaluation              AM-PAC PT "6 Clicks" Mobility   Outcome Measure  Help needed turning from your back to your side while in a flat bed without using bedrails?: None Help needed moving from lying on your back to sitting on the side of a flat bed without using bedrails?: A Little Help needed moving to and from a bed to a chair (including a wheelchair)?: A Little Help needed standing up from a chair using your arms (e.g., wheelchair or bedside chair)?: A Little Help needed to walk in hospital room?: A Little Help  needed climbing 3-5 steps with a railing? : A Lot 6 Click Score: 18    End of Session Equipment Utilized During Treatment: Gait belt;Oxygen Activity Tolerance: Patient limited by fatigue Patient left: in bed;with call bell/phone within reach;with family/visitor present Nurse Communication: Mobility status PT Visit Diagnosis: Muscle weakness (generalized) (M62.81);Difficulty in walking, not elsewhere classified (R26.2)     Time: [id number] PT Time Calculation (min) (ACUTE ONLY): 21 min  Charges:  $Gait Training: 8-22 mins                     [employer], PT, [facility] 12/30/20,11:08 AM

## 2020-12-31 ENCOUNTER — Other Ambulatory Visit: Payer: Self-pay

## 2020-12-31 DIAGNOSIS — J9601 Acute respiratory failure with hypoxia: Secondary | ICD-10-CM | POA: Diagnosis not present

## 2020-12-31 DIAGNOSIS — J189 Pneumonia, unspecified organism: Secondary | ICD-10-CM | POA: Diagnosis not present

## 2020-12-31 DIAGNOSIS — J154 Pneumonia due to other streptococci: Secondary | ICD-10-CM | POA: Diagnosis not present

## 2020-12-31 DIAGNOSIS — I4891 Unspecified atrial fibrillation: Secondary | ICD-10-CM

## 2020-12-31 DIAGNOSIS — A408 Other streptococcal sepsis: Secondary | ICD-10-CM

## 2020-12-31 DIAGNOSIS — R6520 Severe sepsis without septic shock: Secondary | ICD-10-CM

## 2020-12-31 LAB — BASIC METABOLIC PANEL
Anion gap: 7 (ref 5–15)
BUN: 7 mg/dL — ABNORMAL LOW (ref 8–23)
CO2: 26 mmol/L (ref 22–32)
Calcium: 8.1 mg/dL — ABNORMAL LOW (ref 8.9–10.3)
Chloride: 98 mmol/L (ref 98–111)
Creatinine, Ser: 0.56 mg/dL (ref 0.44–1.00)
GFR, Estimated: >60 mL/min (ref >60)
Glucose, Bld: 100 mg/dL — ABNORMAL HIGH (ref 70–99)
Potassium: 3.8 mmol/L (ref 3.5–5.1)
Sodium: 131 mmol/L — ABNORMAL LOW (ref 135–145)

## 2020-12-31 LAB — CBC
HCT: 27.7 % — ABNORMAL LOW (ref 36.0–46.0)
Hemoglobin: 9.5 g/dL — ABNORMAL LOW (ref 12.0–15.0)
MCH: 31.6 pg (ref 26.0–34.0)
MCHC: 34.3 g/dL (ref 30.0–36.0)
MCV: 92 fL (ref 80.0–100.0)
Platelets: 475 10*3/uL — ABNORMAL HIGH (ref 150–400)
RBC: 3.01 MIL/uL — ABNORMAL LOW (ref 3.87–5.11)
RDW: 13.8 % (ref 11.5–15.5)
WBC: 11.3 10*3/uL — ABNORMAL HIGH (ref 4.0–10.5)
nRBC: 0 % (ref 0.0–0.2)

## 2020-12-31 LAB — GLUCOSE, CAPILLARY
Glucose-Capillary: 98 mg/dL (ref 70–99)
Glucose-Capillary: 116 mg/dL — ABNORMAL HIGH (ref 70–99)

## 2020-12-31 LAB — PROTEIN, BODY FLUID (OTHER): Total Protein, Body Fluid Other: 1.7 g/dL

## 2020-12-31 LAB — PROCALCITONIN: Procalcitonin: <0.1 ng/mL

## 2020-12-31 LAB — CYTOLOGY - NON PAP

## 2020-12-31 LAB — MAGNESIUM: Magnesium: 2.1 mg/dL (ref 1.7–2.4)

## 2020-12-31 MED ORDER — BENZONATATE 100 MG PO CAPS: 200.0000 mg | ORAL_CAPSULE | Freq: Three times a day (TID) | ORAL | 0 refills | Status: DC | PRN

## 2020-12-31 MED ORDER — BENZONATATE 100 MG PO CAPS: 200.0000 mg | ORAL_CAPSULE | Freq: Three times a day (TID) | ORAL | Status: DC | PRN

## 2020-12-31 NOTE — Discharge Instructions (Signed)
Anita Benjamin,  You are in the hospital with pneumonia.  You had multifocal pneumonia which means there was evidence of infection of both of your lungs.  He also had some fluid around her lung.  You tested your sputum which was positive for strep pneumonia bacteria infection.  You are treated with 5 days of antibiotics which included ceftriaxone and azithromycin.  It also seems you had tested positive for RSV as an outpatient at the urgent care clinic which likely contributed to your pneumonia as well.  Overall, you have been improving but still requires some oxygen especially when walking around.  This oxygen requirement should improve as your lungs continue to heal after your infection.  The fluid that was obtained from around your lung does not appear to contain any bacterial infections and is likely inflammatory from the overall infection.  I recommend following up with your primary care physician and having a repeat chest x-ray in 2 to 3 weeks.  If you have any worsening symptoms please return for reevaluation. Please use your flutter valve and incentive spirometer multiple times per hour.

## 2020-12-31 NOTE — Progress Notes (Signed)
SATURATION QUALIFICATIONS:  Patient Saturations on Room Air at Rest = 88%  Patient Saturations on 2 Liters at rest was 94-96%  Patient Saturations on 3 Liters of oxygen while Ambulating = 92%  Please briefly explain why patient needs home oxygen: Patient desats on room air at rest and while ambulating. 2L required while at rest and 3L requires while ambulating

## 2020-12-31 NOTE — Progress Notes (Signed)
Oxygen was delivered. IV removed. Instructions reviewed with the patient. Granddaughter present. Patient discharged to waiting ride via wheelchair with belongings and oxygen

## 2020-12-31 NOTE — Discharge Summary (Addendum)
Physician Discharge Summary  Breella Vanostrand TGG:[id number] DOB: 07-Jun-1934 DOA: 12/25/2020  PCP: Einar Pheasant, MD  Admit date: 12/25/2020 Discharge date: 12/31/2020  Admitted From: Home Disposition: Home  Recommendations for Outpatient Follow-up:  1. Follow up with PCP in 1 week 2. Please obtain BMP/CBC in one week 3. Wean oxygen to room air as able 4. Repeat chest x-ray in 2-3 weeks 5. Please follow up on the following pending results: None  Home Health: PT Equipment/Devices: Rolling walker, Oxygen  Discharge Condition: Stable CODE STATUS: Full code Diet recommendation: Heart healthy   Brief/Interim Summary:  Admission HPI written by Christel Mormon, MD   HISTORY OF PRESENT ILLNESS: Eveleen Mcnear is a 87 y.o. Caucasian female with medical history significant for hypertension, dyslipidemia, atrial fibrillation, GERD and anemia, presented to the ER with acute onset of worsening dyspnea with associated cough initially productive of dark yellowish sputum and later with inability to expectorate.  Her symptoms have been going on for about a week with associated generalized weakness, fever and chills.  She had a T-max more than 102 today and her pulse oximetry dropped to the high 70s per her daughter who was with her in the ER.  She was seen in urgent care on Monday and reportedly diagnosed with positive RSV and she was negative for Covid and flu.  She was given medications for cough and nausea.  Symptoms have been getting worse.  She admitted to nausea today and had vomiting once.  No chest pain or palpitations.  No dysuria, oliguria or hematuria or flank pain.  No rhinorrhea or nasal congestion or sore throat or earache.   Hospital course:  Community acquired pneumonia Previous diagnosis of RSV infection.  While in the hospital was found to also have a sputum culture significant for Streptococcus pneumonia.  Chest x-ray significant for multifocal pneumonia.  Patient was started on empiric  ceftriaxone and azithromycin and completed 5 days of treatment.  Chest CT was obtained and was significant for bilateral pleural effusions with left pleural effusion concerning for possible empyema.  Patient underwent left-sided thoracentesis with labs not consistent with empyema.  Pleural fluid Gram stain with no organisms and culture was significant for no growth to date.  Recommend repeat chest x-ray in 2 to 3 weeks.  Acute respiratory failure with hypoxia Secondary to above problem.  Patient was requiring up to 4 L/min of oxygen via nasal cannula and weaned down to 2 L/min of oxygen at rest.  With ambulation, patient required 3 L/min of of oxygen.  Physical therapy recommending home health physical therapy in addition to rolling walker.  Patient qualify for home oxygen.  Recommend weaning patient to room air as able.  Sepsis, ruled out Does not appear to have met sepsis criteria on admission  Paroxysmal atrial fibrillation with RVR RVR was likely secondary to above.  Resolved without need for IV antiarrhythmic medication. Continue Eliquis and home propranolol.  Hyponatremia Mild and asymptomatic. Secondary to hypovolemia and resolved with IV fluids.  Diabetes mellitus, type II Hemoglobin A1C of 5.9%. Continue diet control.  Iron deficiency anemia Continue iron supplementation.  Primary hypertension Continue amlodipine  Anxiety Depression Continue Zoloft  Discharge Diagnoses:  Principal Problem:   Streptococcal pneumonia ([place]) Active Problems:   Essential hypertension   CAP (community acquired pneumonia)   Sepsis ([place])   Atrial fibrillation with RVR ([place])   Hyponatremia   Acute respiratory failure with hypoxia [facility])    Discharge Instructions  Discharge Instructions  Call MD for:  difficulty breathing, headache or visual disturbances   Complete by: As directed    Increase activity slowly   Complete by: As directed      Allergies as of 12/31/2020      Reactions    Oruvail [ketoprofen]    Relafen [nabumetone]    Timentin [ticarcillin-pot Clavulanate]       Medication List    STOP taking these medications   promethazine-dextromethorphan 6.25-15 MG/5ML syrup Commonly known as: PROMETHAZINE-DM     TAKE these medications   acetaminophen 650 MG CR tablet Commonly known as: TYLENOL Take 650 mg by mouth every 8 (eight) hours as needed for pain.   amLODipine 5 MG tablet Commonly known as: NORVASC TAKE 1 TABLET BY MOUTH ONCE DAILY AT [facility] TIME. CALL OUR OFFICE TO SCHEDULE APPOINTMENT FOR FURTHER REFILLS ON MEDICATIONS.   apixaban 2.5 MG Tabs tablet Commonly known as: Eliquis Take 1 tablet (2.5 mg total) by mouth 2 (two) times daily.   benzonatate 100 MG capsule Commonly known as: TESSALON Take 2 capsules (200 mg total) by mouth 3 (three) times daily as needed for cough. What changed:   when to take this  reasons to take this   calcium citrate-vitamin D 500-500 MG-UNIT chewable tablet Chew 1 tablet by mouth daily.   famotidine 20 MG tablet Commonly known as: PEPCID Take 20 mg by mouth daily.   letrozole 2.5 MG tablet Commonly known as: FEMARA Take 2.5 mg by mouth daily.   losartan 100 MG tablet Commonly known as: COZAAR TAKE 1 TABLET BY MOUTH ONCE DAILY   multivitamin tablet Take 1 tablet by mouth daily.   ondansetron 8 MG disintegrating tablet Commonly known as: Zofran ODT Take 1 tablet (8 mg total) by mouth every 8 (eight) hours as needed for nausea or vomiting.   propranolol 10 MG tablet Commonly known as: INDERAL Take 10 mg by mouth 3 (three) times daily as needed (heart rate). Take 1st for rapid heart rate. If no response after 1 hour take diltiazem   sertraline 50 MG tablet Commonly known as: ZOLOFT TAKE 1 TABLET BY MOUTH AT BEDTIME   Vitamin C 250 MG Chew Chew 250 mg by mouth daily.       Follow-up Information    Einar Pheasant, MD. Go on 01/12/2021.   Specialty: Internal Medicine Why:  9:30am  appointment Contact information: [address] [place] [address] [phone]        [facility] Follow up.   Specialty: Home Health Services Why: They will follow up with you for your home health needs. Please call Jana Half at [phone] if you have any questions. Contact information: [address] [place] [id number]              Allergies  Allergen Reactions  . Oruvail [Ketoprofen]   . Relafen [Nabumetone]   . Timentin [Ticarcillin-Pot Clavulanate]     Consultations:  None   Procedures/Studies: DG Chest 2 View  Result Date: 12/22/2020 CLINICAL DATA:  87 year old female with history of cough, fever and weakness. History of breast cancer. EXAM: CHEST - 2 VIEW COMPARISON:  Chest x-ray 06/27/2020. FINDINGS: Lung volumes are normal. No consolidative airspace disease. No pleural effusions. No pneumothorax. Areas of pleuroparenchymal thickening and architectural distortion in the apices of both lungs, similar to prior studies, most compatible with chronic post infectious or inflammatory scarring. No pulmonary nodule or mass noted. Pulmonary vasculature and the cardiomediastinal silhouette are  within normal limits. Atherosclerotic calcifications in the thoracic aorta. IMPRESSION: 1. No radiographic evidence of acute cardiopulmonary disease. 2. Aortic atherosclerosis. Electronically Signed   By: Vinnie Langton M.D.   On: 12/22/2020 12:06   CT CHEST WO CONTRAST  Result Date: 12/28/2020 CLINICAL DATA:  Respiratory failure.  Cough and SOB. EXAM: CT CHEST WITHOUT CONTRAST TECHNIQUE: Multidetector CT imaging of the chest was performed following the standard protocol without IV contrast. COMPARISON:  Chest radiograph 12/22/2020. FINDINGS: Cardiovascular: The heart size is normal. No pericardial effusion. Aortic atherosclerosis. Mediastinum/Nodes: Small thyroid nodules are identified. The largest is in the right  upper lobe measuring 1.5 cm, image 4/2. This has been evaluated on previous imaging. (ref: J Am Coll Radiol. 2015 Feb;12(2): 143-50).See report from thyroid ultrasound 08/01/2019 insert trach Normal appearance of the esophagus. Lungs/Pleura: Small bilateral pleural effusions are identified. The left pleural effusion may be partially loculated. Bilateral upper and lower lobe ground-glass and airspace densities are identified compatible with multifocal pneumonia. Several thin walled cystic structures within the left upper lobe are noted measuring up to 1.5 cm and favored to represent pneumatoceles. Upper Abdomen: Small hiatal hernia. No acute findings within the imaged portions of the upper abdomen. Musculoskeletal: No chest wall mass or suspicious bone lesions identified. Mild thoracic degenerative disc disease. No acute or suspicious osseous findings. IMPRESSION: 1. Bilateral upper and lower lobe ground-glass and airspace densities are identified compatible with multifocal pneumonia. 2. Small bilateral pleural effusions. The left pleural effusion may be partially loculated. 3. Aortic atherosclerosis. Aortic Atherosclerosis (ICD10-I70.0). Electronically Signed   By: Kerby Moors M.D.   On: 12/28/2020 14:52   DG Chest Port 1 View  Result Date: 12/29/2020 CLINICAL DATA:  Pleural effusion.  Status post thoracentesis. EXAM: PORTABLE CHEST 1 VIEW COMPARISON:  Radiograph 12/25/2020, CT yesterday. FINDINGS: No evidence of pneumothorax post left thoracentesis perineum small volume of pleural fluid at the lung base persists, may be partially loculated. Small right pleural effusion again seen. Multifocal bilateral airspace disease unchanged from CT yesterday heart is normal in size with unchanged mediastinal contours. IMPRESSION: 1. No evidence of pneumothorax post left thoracentesis. Small volume of pleural fluid at the left lung base persists, may be partially loculated. 2. Unchanged bilateral multifocal airspace  disease and small right pleural effusion. Electronically Signed   By: Keith Rake M.D.   On: 12/29/2020 17:19   DG Chest Portable 1 View  Result Date: 12/25/2020 CLINICAL DATA:  Shortness of breath for 2 days EXAM: PORTABLE CHEST 1 VIEW COMPARISON:  12/22/2020 FINDINGS: Single frontal view of the chest demonstrates interval development of multifocal bilateral airspace disease, left greater than right. Trace left pleural effusion. No pneumothorax. Cardiac silhouette is unremarkable. No acute bony abnormalities. IMPRESSION: 1. Interval development of bilateral airspace disease, left greater than right, with trace left pleural effusion. Findings could reflect asymmetric edema or multifocal pneumonia. Electronically Signed   By: Randa Ngo M.D.   On: 12/25/2020 20:50   US THORACENTESIS ASP PLEURAL SPACE W/IMG GUIDE  Result Date: 12/30/2020 INDICATION: Patient history of acute hypoxic respiratory failure with small loculated bilateral pleural effusions. Request is for therapeutic and diagnostic thoracentesis EXAM: ULTRASOUND GUIDED THERAPEUTIC AND DIAGNOSTIC THORACENTESIS MEDICATIONS: Lidocaine 1% 10 mL COMPLICATIONS: None immediate. PROCEDURE: An ultrasound guided thoracentesis was thoroughly discussed with the patient and questions answered. The benefits, risks, alternatives and complications were also discussed. The patient understands and wishes to proceed with the procedure. Written consent was obtained. Ultrasound was performed to localize and mark an adequate  pocket of fluid in the left chest. The area was then prepped and draped in the normal sterile fashion. 1% Lidocaine was used for local anesthesia. Under ultrasound guidance a 6 Fr Safe-T-Centesis catheter was introduced. Thoracentesis was performed. The catheter was removed and a dressing applied. FINDINGS: Ultrasound chest demonstrates a left-sided loculated and small right-sided pleural a total of approximately 100 mL of serosanguineous  fluid was removed. Samples were sent to the laboratory as requested by the clinical team. IMPRESSION: Successful ultrasound guided left-sided therapeutic and diagnostic thoracentesis yielding 100 mL of pleural fluid. Read by: Rushie Nyhan, NP Electronically Signed   By: Jerilynn Mages.  Shick M.D.   On: 12/29/2020 17:17     Subjective: No dyspnea or chest pain.  Discharge Exam: Vitals:   12/31/20 0853 12/31/20 1139  BP: (!) 145/62 118/63  Pulse: 83 79  Resp: 16 18  Temp: 98.7 F (37.1 C) 98.3 F (36.8 C)  SpO2: 97% 90%   Vitals:   12/30/20 2342 12/31/20 0428 12/31/20 0853 12/31/20 1139  BP: (!) 121/55 126/71 (!) 145/62 118/63  Pulse: 75 77 83 79  Resp: _0 Temp: 99.7 F (37.6 C) 99.6 F (37.6 C) 98.7 F (37.1 C) 98.3 F (36.8 C)  TempSrc: Oral Oral Oral   SpO2: 93% 92% 97% 90%  Weight:      Height:        General exam: Appears calm and comfortable and in no acute distress. Conversant Respiratory: Slightly diminished to auscultation but otherwise clear. Respiratory effort normal with no intercostal retractions or use of accessory muscles Cardiovascular: S1 & S2 heard, RRR. No murmurs, rubs, gallops or clicks. No edema Gastrointestinal: Abdomen is nondistended, soft and nontender. No masses felt. Normal bowel sounds heard Neurologic: No new focal neurological deficits Musculoskeletal: No calf tenderness Skin: No cyanosis. No new rashes Psychiatry: Alert and oriented. Memory intact. Mood & affect appropriate    The results of significant diagnostics from this hospitalization (including imaging, microbiology, ancillary and laboratory) are listed below for reference.     Microbiology: Recent Results (from the past 240 hour(s))  Resp Panel by RT-PCR (Flu A&B, Covid) Nasopharyngeal Swab     Status: None   Collection Time: 12/22/20 11:11 AM   Specimen: Nasopharyngeal Swab; Nasopharyngeal(NP) swabs in vial transport medium  Result Value Ref Range Status   SARS  Coronavirus 2 by RT PCR NEGATIVE NEGATIVE Final    Comment: (NOTE) SARS-CoV-2 target nucleic acids are NOT DETECTED.  The SARS-CoV-2 RNA is generally detectable in upper respiratory specimens during the acute phase of infection. The lowest concentration of SARS-CoV-2 viral copies this assay can detect is 138 copies/mL. A negative result does not preclude SARS-Cov-2 infection and should not be used as the sole basis for treatment or other patient management decisions. A negative result may occur with  improper specimen collection/handling, submission of specimen other than nasopharyngeal swab, presence of viral mutation(s) within the areas targeted by this assay, and inadequate number of viral copies(<138 copies/mL). A negative result must be combined with clinical observations, patient history, and epidemiological information. The expected result is Negative.  Fact Sheet for Patients:  EntrepreneurPulse.com.au  Fact Sheet for Healthcare Providers:  IncredibleEmployment.be  This test is no t yet approved or cleared by the [place] FDA and  has been authorized for detection and/or diagnosis of SARS-CoV-2 by FDA under an Emergency Use Authorization (EUA). This EUA will remain  in effect (meaning this test can be used) for the duration of  the COVID-19 declaration under Section 564(b)(1) of the Act, 21 U.S.C.section 360bbb-3(b)(1), unless the authorization is terminated  or revoked sooner.       Influenza A by PCR NEGATIVE NEGATIVE Final   Influenza B by PCR NEGATIVE NEGATIVE Final    Comment: (NOTE) The Xpert Xpress SARS-CoV-2/FLU/RSV plus assay is intended as an aid in the diagnosis of influenza from Nasopharyngeal swab specimens and should not be used as a sole basis for treatment. Nasal washings and aspirates are unacceptable for Xpert Xpress SARS-CoV-2/FLU/RSV testing.  Fact Sheet for  Patients: EntrepreneurPulse.com.au  Fact Sheet for Healthcare Providers: IncredibleEmployment.be  This test is not yet approved or cleared by the [place] FDA and has been authorized for detection and/or diagnosis of SARS-CoV-2 by FDA under an Emergency Use Authorization (EUA). This EUA will remain in effect (meaning this test can be used) for the duration of the COVID-19 declaration under Section 564(b)(1) of the Act, 21 U.S.C. section 360bbb-3(b)(1), unless the authorization is terminated or revoked.  Performed at [facility], [address], [place], [place] [address]   Blood culture (routine x 2)     Status: None   Collection Time: 12/25/20  8:25 PM   Specimen: BLOOD  Result Value Ref Range Status   Specimen Description BLOOD RIGHT ANTECUBITAL  Final   Special Requests   Final    BOTTLES DRAWN AEROBIC AND ANAEROBIC Blood Culture adequate volume   Culture   Final    NO GROWTH 5 DAYS Performed at [facility], [address] [place], [place] [address]    Report Status 12/30/2020 FINAL  Final  Resp Panel by RT-PCR (Flu A&B, Covid) Nasopharyngeal Swab     Status: None   Collection Time: 12/25/20  9:14 PM   Specimen: Nasopharyngeal Swab; Nasopharyngeal(NP) swabs in vial transport medium  Result Value Ref Range Status   SARS Coronavirus 2 by RT PCR NEGATIVE NEGATIVE Final    Comment: (NOTE) SARS-CoV-2 target nucleic acids are NOT DETECTED.  The SARS-CoV-2 RNA is generally detectable in upper respiratory specimens during the acute phase of infection. The lowest concentration of SARS-CoV-2 viral copies this assay can detect is 138 copies/mL. A negative result does not preclude SARS-Cov-2 infection and should not be used as the sole basis for treatment or other patient management decisions. A negative result may occur with  improper specimen collection/handling, submission of specimen other than nasopharyngeal  swab, presence of viral mutation(s) within the areas targeted by this assay, and inadequate number of viral copies(<138 copies/mL). A negative result must be combined with clinical observations, patient history, and epidemiological information. The expected result is Negative.  Fact Sheet for Patients:  EntrepreneurPulse.com.au  Fact Sheet for Healthcare Providers:  IncredibleEmployment.be  This test is no t yet approved or cleared by the [place] FDA and  has been authorized for detection and/or diagnosis of SARS-CoV-2 by FDA under an Emergency Use Authorization (EUA). This EUA will remain  in effect (meaning this test can be used) for the duration of the COVID-19 declaration under Section 564(b)(1) of the Act, 21 U.S.C.section 360bbb-3(b)(1), unless the authorization is terminated  or revoked sooner.       Influenza A by PCR NEGATIVE NEGATIVE Final   Influenza B by PCR NEGATIVE NEGATIVE Final    Comment: (NOTE) The Xpert Xpress SARS-CoV-2/FLU/RSV plus assay is intended as an aid in the diagnosis of influenza from Nasopharyngeal swab specimens and should not be used as a sole basis for treatment. Nasal washings  and aspirates are unacceptable for Xpert Xpress SARS-CoV-2/FLU/RSV testing.  Fact Sheet for Patients: EntrepreneurPulse.com.au  Fact Sheet for Healthcare Providers: IncredibleEmployment.be  This test is not yet approved or cleared by the [place] FDA and has been authorized for detection and/or diagnosis of SARS-CoV-2 by FDA under an Emergency Use Authorization (EUA). This EUA will remain in effect (meaning this test can be used) for the duration of the COVID-19 declaration under Section 564(b)(1) of the Act, 21 U.S.C. section 360bbb-3(b)(1), unless the authorization is terminated or revoked.  Performed at [facility], [address] [place], [place] [address]   Blood  culture (routine x 2)     Status: Abnormal   Collection Time: 12/25/20 10:30 PM   Specimen: BLOOD  Result Value Ref Range Status   Specimen Description   Final    BLOOD LEFT ANTECUBITAL Performed at [facility], [address], [place], [place] [address]    Special Requests   Final    BOTTLES DRAWN AEROBIC AND ANAEROBIC Blood Culture adequate volume Performed at [facility], [address] [place], [place] [address]    Culture  Setup Time   Final    GRAM POSITIVE COCCI AEROBIC BOTTLE ONLY CRITICAL RESULT CALLED TO, READ BACK BY AND VERIFIED WITH: MORGAN HICKS AT 1844 ON 12/26/20 BY SS    Culture (A)  Final    STAPHYLOCOCCUS HOMINIS THE SIGNIFICANCE OF ISOLATING THIS ORGANISM FROM A SINGLE SET OF BLOOD CULTURES WHEN MULTIPLE SETS ARE DRAWN IS UNCERTAIN. PLEASE NOTIFY THE MICROBIOLOGY DEPARTMENT WITHIN ONE WEEK IF SPECIATION AND SENSITIVITIES ARE REQUIRED. Performed at [facility], [place] [address], [place], [place] [address]    Report Status 12/28/2020 FINAL  Final  Blood Culture ID Panel (Reflexed)     Status: Abnormal   Collection Time: 12/25/20 10:30 PM  Result Value Ref Range Status   Enterococcus faecalis NOT DETECTED NOT DETECTED Final   Enterococcus Faecium NOT DETECTED NOT DETECTED Final   Listeria monocytogenes NOT DETECTED NOT DETECTED Final   Staphylococcus species DETECTED (A) NOT DETECTED Final    Comment: CRITICAL RESULT CALLED TO, READ BACK BY AND VERIFIED WITH: MORGAN HICKS AT 1844 ON 12/26/20 BY SS    Staphylococcus aureus (BCID) NOT DETECTED NOT DETECTED Final   Staphylococcus epidermidis NOT DETECTED NOT DETECTED Final   Staphylococcus lugdunensis NOT DETECTED NOT DETECTED Final   Streptococcus species NOT DETECTED NOT DETECTED Final   Streptococcus agalactiae NOT DETECTED NOT DETECTED Final   Streptococcus pneumoniae NOT DETECTED NOT DETECTED Final   Streptococcus pyogenes NOT DETECTED NOT DETECTED Final   A.calcoaceticus-baumannii  NOT DETECTED NOT DETECTED Final   Bacteroides fragilis NOT DETECTED NOT DETECTED Final   Enterobacterales NOT DETECTED NOT DETECTED Final   Enterobacter cloacae complex NOT DETECTED NOT DETECTED Final   Escherichia coli NOT DETECTED NOT DETECTED Final   Klebsiella aerogenes NOT DETECTED NOT DETECTED Final   Klebsiella oxytoca NOT DETECTED NOT DETECTED Final   Klebsiella pneumoniae NOT DETECTED NOT DETECTED Final   Proteus species NOT DETECTED NOT DETECTED Final   Salmonella species NOT DETECTED NOT DETECTED Final   Serratia marcescens NOT DETECTED NOT DETECTED Final   Haemophilus influenzae NOT DETECTED NOT DETECTED Final   Neisseria meningitidis NOT DETECTED NOT DETECTED Final   Pseudomonas aeruginosa NOT DETECTED NOT DETECTED Final   Stenotrophomonas maltophilia NOT DETECTED NOT DETECTED Final   Candida albicans NOT DETECTED NOT DETECTED Final   Candida auris NOT DETECTED NOT DETECTED Final   Candida  glabrata NOT DETECTED NOT DETECTED Final   Candida krusei NOT DETECTED NOT DETECTED Final   Candida parapsilosis NOT DETECTED NOT DETECTED Final   Candida tropicalis NOT DETECTED NOT DETECTED Final   Cryptococcus neoformans/gattii NOT DETECTED NOT DETECTED Final    Comment: Performed at [facility], [address] [place], [place] [address]  Culture, sputum-assessment     Status: None   Collection Time: 12/26/20  6:24 AM   Specimen: Sputum  Result Value Ref Range Status   Specimen Description SPUTUM  Final   Special Requests NONE  Final   Sputum evaluation   Final    THIS SPECIMEN IS ACCEPTABLE FOR SPUTUM CULTURE Performed at [facility], [address], [place], [place] [address]    Report Status 12/26/2020 FINAL  Final  Culture, Respiratory w Gram Stain     Status: None   Collection Time: 12/26/20  6:24 AM   Specimen: SPU  Result Value Ref Range Status   Specimen Description   Final    SPUTUM Performed at [facility], [address],  [place], [place] [address]    Special Requests   Final    NONE Reflexed from [id number] Performed at [facility], [address] [place], [place] [address]    Gram Stain   Final    MODERATE WBC PRESENT,BOTH PMN AND MONONUCLEAR MODERATE GRAM POSITIVE COCCI MODERATE GRAM VARIABLE ROD Performed at [facility], [place] [address] [place], [place] [address]    Culture MODERATE STREPTOCOCCUS PNEUMONIAE  Final   Report Status 12/29/2020 FINAL  Final   Organism ID, Bacteria STREPTOCOCCUS PNEUMONIAE  Final      Susceptibility   Streptococcus pneumoniae - MIC*    ERYTHROMYCIN >=8 RESISTANT Resistant     LEVOFLOXACIN 0.5 SENSITIVE Sensitive     VANCOMYCIN 0.5 SENSITIVE Sensitive     PENICILLIN (meningitis) 1 RESISTANT Resistant     PENO - penicillin 1      PENICILLIN (non-meningitis) 1 SENSITIVE Sensitive     PENICILLIN (oral) 1 INTERMEDIATE Intermediate     CEFTRIAXONE (non-meningitis) 1 SENSITIVE Sensitive     CEFTRIAXONE (meningitis) 1 INTERMEDIATE Intermediate     * MODERATE STREPTOCOCCUS PNEUMONIAE  CULTURE, BLOOD (ROUTINE X 2) w Reflex to ID Panel     Status: None (Preliminary result)   Collection Time: 12/27/20  5:30 AM   Specimen: BLOOD  Result Value Ref Range Status   Specimen Description BLOOD RIGHT FOREARM  Final   Special Requests   Final    BOTTLES DRAWN AEROBIC AND ANAEROBIC Blood Culture adequate volume   Culture   Final    NO GROWTH 4 DAYS Performed at [facility], [address], [place], [place] [address]    Report Status PENDING  Incomplete  CULTURE, BLOOD (ROUTINE X 2) w Reflex to ID Panel     Status: None (Preliminary result)   Collection Time: 12/27/20  5:36 AM   Specimen: BLOOD  Result Value Ref Range Status   Specimen Description BLOOD RIGHT ASSIST CONTROL  Final   Special Requests   Final    BOTTLES DRAWN AEROBIC AND ANAEROBIC Blood Culture results may not be optimal due to an inadequate volume of blood received in culture bottles   Culture    Final    NO GROWTH 4 DAYS Performed at [facility], [address], [place], [place] [address]    Report Status PENDING  Incomplete  Body fluid culture w Gram Stain  Status: None (Preliminary result)   Collection Time: 12/29/20  4:43 PM   Specimen: PATH Cytology Pleural fluid  Result Value Ref Range Status   Specimen Description   Final    PLEURAL Performed at [facility], [address], [place], [place] [address]    Special Requests   Final    PLEURAL Performed at [facility], [address] [place], [place] [address]    Gram Stain   Final    RARE WBC PRESENT, PREDOMINANTLY PMN NO ORGANISMS SEEN    Culture   Final    NO GROWTH 2 DAYS Performed at [facility], [place] [address], [place], [place] [address]    Report Status PENDING  Incomplete     Labs: BNP (last 3 results) Recent Labs    06/27/20 1546 12/25/20 2025  BNP 199.3* 195.0*   Basic Metabolic Panel: Recent Labs  Lab 12/26/20 0505 12/27/20 0530 12/29/20 0551 12/30/20 0432 12/31/20 0435  NA 132* 137 133* 136 131*  K 4.0 4.5 4.3 4.2 3.8  CL 100 107 98 101 98  CO2 21* _0 GLUCOSE 205* 125* 100* 111* 100*  BUN _1 7*  CREATININE 1.00 0.71 0.61 0.56 0.56  CALCIUM 8.9 8.9 8.6* 8.4* 8.1*  MG  --  2.1  --  1.5* 2.1  PHOS  --  2.8  --  3.2  --    Liver Function Tests: Recent Labs  Lab 12/26/20 0505 12/29/20 1814  AST 38  --   ALT 28  --   ALKPHOS 73  --   BILITOT 0.6  --   PROT 6.3* 6.1*  ALBUMIN 2.6*  --    No results for input(s): LIPASE, AMYLASE in the last 168 hours. No results for input(s): AMMONIA in the last 168 hours. CBC: Recent Labs  Lab 12/25/20 2025 12/26/20 0505 12/27/20 0530 12/28/20 0437 12/29/20 0551 12/30/20 0432 12/31/20 0435  WBC 10.1   < > 13.8* 11.9* 12.7* 12.1* 11.3*  NEUTROABS 9.1*  --   --  10.2*  --   --   --   HGB 10.8*   < > 10.0* 10.2* 10.5* 9.5* 9.5*  HCT 31.2*   < > 29.2* 30.7* 30.2* 27.7* 27.7*   MCV 91.5   < > 92.4 93.9 92.1 92.0 92.0  PLT 320   < > 367 413* 393 443* 475*   < > = values in this interval not displayed.   Cardiac Enzymes: No results for input(s): CKTOTAL, CKMB, CKMBINDEX, TROPONINI in the last 168 hours. BNP: Invalid input(s): POCBNP CBG: Recent Labs  Lab 12/30/20 1145 12/30/20 1622 12/30/20 2115 12/31/20 0744 12/31/20 1136  GLUCAP 133* 106* 123* 98 116*   D-Dimer No results for input(s): DDIMER in the last 72 hours. Hgb A1c No results for input(s): HGBA1C in the last 72 hours. Lipid Profile No results for input(s): CHOL, HDL, LDLCALC, TRIG, CHOLHDL, LDLDIRECT in the last 72 hours. Thyroid function studies No results for input(s): TSH, T4TOTAL, T3FREE, THYROIDAB in the last 72 hours.  Invalid input(s): FREET3 Anemia work up No results for input(s): VITAMINB12, FOLATE, FERRITIN, TIBC, IRON, RETICCTPCT in the last 72 hours. Urinalysis    Component Value Date/Time   COLORURINE YELLOW (A) 06/27/2020 1658   APPEARANCEUR CLEAR (A) 06/27/2020 1658   LABSPEC 1.014 06/27/2020 1658   PHURINE 6.0 06/27/2020 1658   GLUCOSEU NEGATIVE 06/27/2020 1658   GLUCOSEU NEGATIVE 10/09/2014 1641   HGBUR NEGATIVE 06/27/2020  [place] 06/27/2020 1658   BILIRUBINUR neg 09/09/2015 [place] 06/27/2020 1658   PROTEINUR NEGATIVE 06/27/2020 1658   UROBILINOGEN 0.2 09/09/2015 1049   UROBILINOGEN 0.2 10/09/2014 1641   NITRITE NEGATIVE 06/27/2020 1658   LEUKOCYTESUR NEGATIVE 06/27/2020 1658   Sepsis Labs Invalid input(s): PROCALCITONIN,  WBC,  LACTICIDVEN Microbiology Recent Results (from the past 240 hour(s))  Resp Panel by RT-PCR (Flu A&B, Covid) Nasopharyngeal Swab     Status: None   Collection Time: 12/22/20 11:11 AM   Specimen: Nasopharyngeal Swab; Nasopharyngeal(NP) swabs in vial transport medium  Result Value Ref Range Status   SARS Coronavirus 2 by RT PCR NEGATIVE NEGATIVE Final    Comment: (NOTE) SARS-CoV-2 target nucleic  acids are NOT DETECTED.  The SARS-CoV-2 RNA is generally detectable in upper respiratory specimens during the acute phase of infection. The lowest concentration of SARS-CoV-2 viral copies this assay can detect is 138 copies/mL. A negative result does not preclude SARS-Cov-2 infection and should not be used as the sole basis for treatment or other patient management decisions. A negative result may occur with  improper specimen collection/handling, submission of specimen other than nasopharyngeal swab, presence of viral mutation(s) within the areas targeted by this assay, and inadequate number of viral copies(<138 copies/mL). A negative result must be combined with clinical observations, patient history, and epidemiological information. The expected result is Negative.  Fact Sheet for Patients:  EntrepreneurPulse.com.au  Fact Sheet for Healthcare Providers:  IncredibleEmployment.be  This test is no t yet approved or cleared by the [place] FDA and  has been authorized for detection and/or diagnosis of SARS-CoV-2 by FDA under an Emergency Use Authorization (EUA). This EUA will remain  in effect (meaning this test can be used) for the duration of the COVID-19 declaration under Section 564(b)(1) of the Act, 21 U.S.C.section 360bbb-3(b)(1), unless the authorization is terminated  or revoked sooner.       Influenza A by PCR NEGATIVE NEGATIVE Final   Influenza B by PCR NEGATIVE NEGATIVE Final    Comment: (NOTE) The Xpert Xpress SARS-CoV-2/FLU/RSV plus assay is intended as an aid in the diagnosis of influenza from Nasopharyngeal swab specimens and should not be used as a sole basis for treatment. Nasal washings and aspirates are unacceptable for Xpert Xpress SARS-CoV-2/FLU/RSV testing.  Fact Sheet for Patients: EntrepreneurPulse.com.au  Fact Sheet for Healthcare Providers: IncredibleEmployment.be  This  test is not yet approved or cleared by the [place] FDA and has been authorized for detection and/or diagnosis of SARS-CoV-2 by FDA under an Emergency Use Authorization (EUA). This EUA will remain in effect (meaning this test can be used) for the duration of the COVID-19 declaration under Section 564(b)(1) of the Act, 21 U.S.C. section 360bbb-3(b)(1), unless the authorization is terminated or revoked.  Performed at [facility], [address] [place], [place] [address]   Blood culture (routine x 2)     Status: None   Collection Time: 12/25/20  8:25 PM   Specimen: BLOOD  Result Value Ref Range Status   Specimen Description BLOOD RIGHT ANTECUBITAL  Final   Special Requests   Final    BOTTLES DRAWN AEROBIC AND ANAEROBIC Blood Culture adequate volume   Culture   Final    NO GROWTH 5 DAYS Performed at [facility], [address] [place], [place] [address]    Report Status 12/30/2020 FINAL  Final  Resp Panel by RT-PCR (Flu A&B, Covid) Nasopharyngeal Swab  Status: None   Collection Time: 12/25/20  9:14 PM   Specimen: Nasopharyngeal Swab; Nasopharyngeal(NP) swabs in vial transport medium  Result Value Ref Range Status   SARS Coronavirus 2 by RT PCR NEGATIVE NEGATIVE Final    Comment: (NOTE) SARS-CoV-2 target nucleic acids are NOT DETECTED.  The SARS-CoV-2 RNA is generally detectable in upper respiratory specimens during the acute phase of infection. The lowest concentration of SARS-CoV-2 viral copies this assay can detect is 138 copies/mL. A negative result does not preclude SARS-Cov-2 infection and should not be used as the sole basis for treatment or other patient management decisions. A negative result may occur with  improper specimen collection/handling, submission of specimen other than nasopharyngeal swab, presence of viral mutation(s) within the areas targeted by this assay, and inadequate number of viral copies(<138 copies/mL). A negative  result must be combined with clinical observations, patient history, and epidemiological information. The expected result is Negative.  Fact Sheet for Patients:  EntrepreneurPulse.com.au  Fact Sheet for Healthcare Providers:  IncredibleEmployment.be  This test is no t yet approved or cleared by the [place] FDA and  has been authorized for detection and/or diagnosis of SARS-CoV-2 by FDA under an Emergency Use Authorization (EUA). This EUA will remain  in effect (meaning this test can be used) for the duration of the COVID-19 declaration under Section 564(b)(1) of the Act, 21 U.S.C.section 360bbb-3(b)(1), unless the authorization is terminated  or revoked sooner.       Influenza A by PCR NEGATIVE NEGATIVE Final   Influenza B by PCR NEGATIVE NEGATIVE Final    Comment: (NOTE) The Xpert Xpress SARS-CoV-2/FLU/RSV plus assay is intended as an aid in the diagnosis of influenza from Nasopharyngeal swab specimens and should not be used as a sole basis for treatment. Nasal washings and aspirates are unacceptable for Xpert Xpress SARS-CoV-2/FLU/RSV testing.  Fact Sheet for Patients: EntrepreneurPulse.com.au  Fact Sheet for Healthcare Providers: IncredibleEmployment.be  This test is not yet approved or cleared by the [place] FDA and has been authorized for detection and/or diagnosis of SARS-CoV-2 by FDA under an Emergency Use Authorization (EUA). This EUA will remain in effect (meaning this test can be used) for the duration of the COVID-19 declaration under Section 564(b)(1) of the Act, 21 U.S.C. section 360bbb-3(b)(1), unless the authorization is terminated or revoked.  Performed at [facility], [address] [place], [place] [address]   Blood culture (routine x 2)     Status: Abnormal   Collection Time: 12/25/20 10:30 PM   Specimen: BLOOD  Result Value Ref Range Status   Specimen  Description   Final    BLOOD LEFT ANTECUBITAL Performed at [facility], [address] [place], [place] [address]    Special Requests   Final    BOTTLES DRAWN AEROBIC AND ANAEROBIC Blood Culture adequate volume Performed at [facility], [address] [place], [place] [address]    Culture  Setup Time   Final    GRAM POSITIVE COCCI AEROBIC BOTTLE ONLY CRITICAL RESULT CALLED TO, READ BACK BY AND VERIFIED WITH: MORGAN HICKS AT 1844 ON 12/26/20 BY SS    Culture (A)  Final    STAPHYLOCOCCUS HOMINIS THE SIGNIFICANCE OF ISOLATING THIS ORGANISM FROM A SINGLE SET OF BLOOD CULTURES WHEN MULTIPLE SETS ARE DRAWN IS UNCERTAIN. PLEASE NOTIFY THE MICROBIOLOGY DEPARTMENT WITHIN ONE WEEK IF SPECIATION AND SENSITIVITIES ARE REQUIRED. Performed at [facility], [place] [address] [place], [place] [address]    Report Status 12/28/2020  FINAL  Final  Blood Culture ID Panel (Reflexed)     Status: Abnormal   Collection Time: 12/25/20 10:30 PM  Result Value Ref Range Status   Enterococcus faecalis NOT DETECTED NOT DETECTED Final   Enterococcus Faecium NOT DETECTED NOT DETECTED Final   Listeria monocytogenes NOT DETECTED NOT DETECTED Final   Staphylococcus species DETECTED (A) NOT DETECTED Final    Comment: CRITICAL RESULT CALLED TO, READ BACK BY AND VERIFIED WITH: MORGAN HICKS AT 1844 ON 12/26/20 BY SS    Staphylococcus aureus (BCID) NOT DETECTED NOT DETECTED Final   Staphylococcus epidermidis NOT DETECTED NOT DETECTED Final   Staphylococcus lugdunensis NOT DETECTED NOT DETECTED Final   Streptococcus species NOT DETECTED NOT DETECTED Final   Streptococcus agalactiae NOT DETECTED NOT DETECTED Final   Streptococcus pneumoniae NOT DETECTED NOT DETECTED Final   Streptococcus pyogenes NOT DETECTED NOT DETECTED Final   A.calcoaceticus-baumannii NOT DETECTED NOT DETECTED Final   Bacteroides fragilis NOT DETECTED NOT DETECTED Final   Enterobacterales NOT DETECTED NOT DETECTED Final    Enterobacter cloacae complex NOT DETECTED NOT DETECTED Final   Escherichia coli NOT DETECTED NOT DETECTED Final   Klebsiella aerogenes NOT DETECTED NOT DETECTED Final   Klebsiella oxytoca NOT DETECTED NOT DETECTED Final   Klebsiella pneumoniae NOT DETECTED NOT DETECTED Final   Proteus species NOT DETECTED NOT DETECTED Final   Salmonella species NOT DETECTED NOT DETECTED Final   Serratia marcescens NOT DETECTED NOT DETECTED Final   Haemophilus influenzae NOT DETECTED NOT DETECTED Final   Neisseria meningitidis NOT DETECTED NOT DETECTED Final   Pseudomonas aeruginosa NOT DETECTED NOT DETECTED Final   Stenotrophomonas maltophilia NOT DETECTED NOT DETECTED Final   Candida albicans NOT DETECTED NOT DETECTED Final   Candida auris NOT DETECTED NOT DETECTED Final   Candida glabrata NOT DETECTED NOT DETECTED Final   Candida krusei NOT DETECTED NOT DETECTED Final   Candida parapsilosis NOT DETECTED NOT DETECTED Final   Candida tropicalis NOT DETECTED NOT DETECTED Final   Cryptococcus neoformans/gattii NOT DETECTED NOT DETECTED Final    Comment: Performed at [facility], [address] [place], [place] [address]  Culture, sputum-assessment     Status: None   Collection Time: 12/26/20  6:24 AM   Specimen: Sputum  Result Value Ref Range Status   Specimen Description SPUTUM  Final   Special Requests NONE  Final   Sputum evaluation   Final    THIS SPECIMEN IS ACCEPTABLE FOR SPUTUM CULTURE Performed at [facility], [address] [place], [place] [address]    Report Status 12/26/2020 FINAL  Final  Culture, Respiratory w Gram Stain     Status: None   Collection Time: 12/26/20  6:24 AM   Specimen: SPU  Result Value Ref Range Status   Specimen Description   Final    SPUTUM Performed at [facility], [address] [place], [place] [address]    Special Requests   Final    NONE Reflexed from [id number] Performed at [facility], [place], [place] [address]    Gram Stain   Final    MODERATE WBC PRESENT,BOTH PMN AND MONONUCLEAR MODERATE GRAM POSITIVE COCCI MODERATE GRAM VARIABLE ROD Performed at [facility], [address], [place],  [address]    Culture MODERATE STREPTOCOCCUS PNEUMONIAE  Final   Report Status 12/29/2020 FINAL  Final   Organism ID, Bacteria STREPTOCOCCUS PNEUMONIAE  Final      Susceptibility   Streptococcus pneumoniae -  MIC*    ERYTHROMYCIN >=8 RESISTANT Resistant     LEVOFLOXACIN 0.5 SENSITIVE Sensitive     VANCOMYCIN 0.5 SENSITIVE Sensitive     PENICILLIN (meningitis) 1 RESISTANT Resistant     PENO - penicillin 1      PENICILLIN (non-meningitis) 1 SENSITIVE Sensitive     PENICILLIN (oral) 1 INTERMEDIATE Intermediate     CEFTRIAXONE (non-meningitis) 1 SENSITIVE Sensitive     CEFTRIAXONE (meningitis) 1 INTERMEDIATE Intermediate     * MODERATE STREPTOCOCCUS PNEUMONIAE  CULTURE, BLOOD (ROUTINE X 2) w Reflex to ID Panel     Status: None (Preliminary result)   Collection Time: 12/27/20  5:30 AM   Specimen: BLOOD  Result Value Ref Range Status   Specimen Description BLOOD RIGHT FOREARM  Final   Special Requests   Final    BOTTLES DRAWN AEROBIC AND ANAEROBIC Blood Culture adequate volume   Culture   Final    NO GROWTH 4 DAYS Performed at [facility], [address], [place], [place] [address]    Report Status PENDING  Incomplete  CULTURE, BLOOD (ROUTINE X 2) w Reflex to ID Panel     Status: None (Preliminary result)   Collection Time: 12/27/20  5:36 AM   Specimen: BLOOD  Result Value Ref Range Status   Specimen Description BLOOD RIGHT ASSIST CONTROL  Final   Special Requests   Final    BOTTLES DRAWN AEROBIC AND ANAEROBIC Blood Culture results may not be optimal due to an inadequate volume of blood received in culture bottles   Culture   Final    NO GROWTH 4 DAYS Performed at [facility], [address] [place], [place] [address]    Report Status PENDING   Incomplete  Body fluid culture w Gram Stain     Status: None (Preliminary result)   Collection Time: 12/29/20  4:43 PM   Specimen: PATH Cytology Pleural fluid  Result Value Ref Range Status   Specimen Description   Final    PLEURAL Performed at [facility], [address], [place], [place] [address]    Special Requests   Final    PLEURAL Performed at [facility], [address] [place], [place] [address]    Gram Stain   Final    RARE WBC PRESENT, PREDOMINANTLY PMN NO ORGANISMS SEEN    Culture   Final    NO GROWTH 2 DAYS Performed at [facility], [place] [address] [place],  [address]    Report Status PENDING  Incomplete      Time coordinating discharge: 35 minutes  SIGNED:   Cordelia Poche, MD [facility] 12/31/2020, 2:04 PM

## 2020-12-31 NOTE — TOC Transition Note (Signed)
Transition of Care [facility]) - CM/SW Discharge Note   Patient Details  Name: Maddyx Vallie MRN: [id number] Date of Birth: [birth year]  Transition of Care [facility]) CM/SW Contact:  Candie Chroman, LCSW Phone Number: 12/31/2020, 1:57 PM   Clinical Narrative:  Patient has orders to discharge home today. She is agreeable to home health. No agency preference. Set up with [facility] for PT, OT, RN. Ordered oxygen through [employer]. Patient stated she already has a walker at home. No further concerns. CSW signing off.   Final next level of care: Home w Home Health Services Barriers to Discharge: Barriers Resolved   Patient Goals and CMS Choice   CMS Medicare.gov Compare Post Acute Care list provided to:: Patient (Family member at bedside.) Choice offered to / list presented to : [place]  Discharge Placement                Patient to be transferred to facility by: Family will take her home.   Patient and family notified of of transfer: 12/31/20  Discharge Plan and Services     Post Acute Care Choice:  (TBD)          DME Arranged: Oxygen DME Agency: [employer] Date DME Agency Contacted: 12/31/20   Representative spoke with at DME Agency: Andree Coss and Nash Shearer [facility] Arranged: RN,PT,OT [facility] Agency: [facility] Date [facility] Agency Contacted: 12/31/20   Representative spoke with at [place]: [place] ([place]) Interventions     Readmission Risk Interventions No flowsheet data found.

## 2021-01-01 ENCOUNTER — Telehealth: Payer: Self-pay

## 2021-01-01 DIAGNOSIS — E871 Hypo-osmolality and hyponatremia: Secondary | ICD-10-CM

## 2021-01-01 DIAGNOSIS — J9601 Acute respiratory failure with hypoxia: Secondary | ICD-10-CM

## 2021-01-01 DIAGNOSIS — A419 Sepsis, unspecified organism: Secondary | ICD-10-CM

## 2021-01-01 DIAGNOSIS — J154 Pneumonia due to other streptococci: Secondary | ICD-10-CM

## 2021-01-01 DIAGNOSIS — I4891 Unspecified atrial fibrillation: Secondary | ICD-10-CM

## 2021-01-01 LAB — CULTURE, BLOOD (ROUTINE X 2)
Culture: NO GROWTH
Culture: NO GROWTH
Special Requests: ADEQUATE

## 2021-01-01 NOTE — Telephone Encounter (Signed)
Transition Care Management Follow-up Telephone Call  Date of discharge and from where: 12/31/20 from [facility].  How have you been since you were released from the hospital? Patient states," I am feeling 100 percent better. Wearing oxygen continuous at 3L with ambulation and 2L at rest. Sats at 91-94 percent while wearing oxygen, intermittent productive cough. Spirometer in use as often as possible. I don't have much appetite." Staying hydrated. Denies headaches, visual disturbances, dizziness, pain, difficulty breathing while wearing oxygen.   Any questions or concerns? No.   Items Reviewed:  Did the pt receive and understand the discharge instructions provided? Yes   Medications obtained and verified? Yes   Other? Yes , oxygen continuous 2-3L.  Any new allergies since your discharge? No   Dietary orders reviewed? Heart healthy  Do you have support at home? Yes , daughter and grand daughter who is a Marine scientist.   Home Care and Equipment/Supplies: Were home health services ordered? Yes [facility].  Jana Half [phone] for any questions or concerns  Functional Questionnaire: (I = Independent and D = Dependent) ADLs: I  Transferring/Ambulation- rolling walker  Follow up appointments reviewed:   PCP Hospital f/u appt confirmed? Yes  Scheduled to see Dr. Nicki Reaper on 01/12/21 @ 9:30.  Are transportation arrangements needed? No   If their condition worsens, is the pt aware to call PCP or go to the Emergency Dept.? Yes  Was the patient provided with contact information for the PCP's office or ED? Yes  Was to pt encouraged to call back with questions or concerns? Yes

## 2021-01-02 LAB — BODY FLUID CULTURE W GRAM STAIN: Culture: NO GROWTH

## 2021-01-08 DIAGNOSIS — Z853 Personal history of malignant neoplasm of breast: Secondary | ICD-10-CM

## 2021-01-08 DIAGNOSIS — J189 Pneumonia, unspecified organism: Secondary | ICD-10-CM | POA: Diagnosis not present

## 2021-01-08 DIAGNOSIS — I4892 Unspecified atrial flutter: Secondary | ICD-10-CM

## 2021-01-08 DIAGNOSIS — Z7901 Long term (current) use of anticoagulants: Secondary | ICD-10-CM

## 2021-01-08 DIAGNOSIS — J9691 Respiratory failure, unspecified with hypoxia: Secondary | ICD-10-CM | POA: Diagnosis not present

## 2021-01-08 DIAGNOSIS — E7800 Pure hypercholesterolemia, unspecified: Secondary | ICD-10-CM

## 2021-01-08 DIAGNOSIS — I10 Essential (primary) hypertension: Secondary | ICD-10-CM | POA: Diagnosis not present

## 2021-01-08 DIAGNOSIS — K219 Gastro-esophageal reflux disease without esophagitis: Secondary | ICD-10-CM

## 2021-01-08 DIAGNOSIS — I480 Paroxysmal atrial fibrillation: Secondary | ICD-10-CM | POA: Diagnosis not present

## 2021-01-08 DIAGNOSIS — D649 Anemia, unspecified: Secondary | ICD-10-CM

## 2021-01-08 DIAGNOSIS — Z9181 History of falling: Secondary | ICD-10-CM

## 2021-01-12 ENCOUNTER — Encounter: Payer: Self-pay | Admitting: Internal Medicine

## 2021-01-12 ENCOUNTER — Ambulatory Visit: Payer: Medicare Other | Admitting: Internal Medicine

## 2021-01-12 ENCOUNTER — Other Ambulatory Visit: Payer: Self-pay

## 2021-01-12 ENCOUNTER — Telehealth: Payer: Self-pay

## 2021-01-12 VITALS — BP 122/72 | HR 78 | Temp 98.3°F | Resp 16 | Ht 67.0 in | Wt 126.2 lb

## 2021-01-12 DIAGNOSIS — R0602 Shortness of breath: Secondary | ICD-10-CM | POA: Diagnosis not present

## 2021-01-12 DIAGNOSIS — I10 Essential (primary) hypertension: Secondary | ICD-10-CM

## 2021-01-12 DIAGNOSIS — D649 Anemia, unspecified: Secondary | ICD-10-CM | POA: Diagnosis not present

## 2021-01-12 DIAGNOSIS — J154 Pneumonia due to other streptococci: Secondary | ICD-10-CM

## 2021-01-12 DIAGNOSIS — J9601 Acute respiratory failure with hypoxia: Secondary | ICD-10-CM

## 2021-01-12 DIAGNOSIS — I4891 Unspecified atrial fibrillation: Secondary | ICD-10-CM

## 2021-01-12 DIAGNOSIS — E871 Hypo-osmolality and hyponatremia: Secondary | ICD-10-CM

## 2021-01-12 LAB — BASIC METABOLIC PANEL
BUN: 17 mg/dL (ref 6–23)
CO2: 29 mEq/L (ref 19–32)
Calcium: 9.6 mg/dL (ref 8.4–10.5)
Chloride: 103 mEq/L (ref 96–112)
Creatinine, Ser: 0.76 mg/dL (ref 0.40–1.20)
GFR: 71.74 mL/min (ref >60.00)
Glucose, Bld: 89 mg/dL (ref 70–99)
Potassium: 5.2 mEq/L — ABNORMAL HIGH (ref 3.5–5.1)
Sodium: 138 mEq/L (ref 135–145)

## 2021-01-12 LAB — CBC WITH DIFFERENTIAL/PLATELET
Basophils Absolute: 0.1 10*3/uL (ref 0.0–0.1)
Basophils Relative: 2.2 % (ref 0.0–3.0)
Eosinophils Absolute: 0.2 10*3/uL (ref 0.0–0.7)
Eosinophils Relative: 3.6 % (ref 0.0–5.0)
HCT: 32.4 % — ABNORMAL LOW (ref 36.0–46.0)
Hemoglobin: 10.8 g/dL — ABNORMAL LOW (ref 12.0–15.0)
Lymphocytes Relative: 13.9 % (ref 12.0–46.0)
Lymphs Abs: 0.6 10*3/uL — ABNORMAL LOW (ref 0.7–4.0)
MCHC: 33.4 g/dL (ref 30.0–36.0)
MCV: 94.6 fl (ref 78.0–100.0)
Monocytes Absolute: 0.5 10*3/uL (ref 0.1–1.0)
Monocytes Relative: 10.9 % (ref 3.0–12.0)
Neutro Abs: 3.1 10*3/uL (ref 1.4–7.7)
Neutrophils Relative %: 69.4 % (ref 43.0–77.0)
Platelets: 462 10*3/uL — ABNORMAL HIGH (ref 150.0–400.0)
RBC: 3.43 Mil/uL — ABNORMAL LOW (ref 3.87–5.11)
RDW: 14.6 % (ref 11.5–15.5)
WBC: 4.5 10*3/uL (ref 4.0–10.5)

## 2021-01-12 NOTE — Telephone Encounter (Signed)
[employer] is the name of the oxygen company that PCP needed. Their number is [phone]

## 2021-01-12 NOTE — Progress Notes (Signed)
Patient ID: Jil Penland, female   DOB: [birth year], 87 y.o.   MRN: [id number]   Subjective:    Patient ID: [name], female    DOB: [birth year], 87 y.o.   MRN: [id number]  HPI This visit occurred during the SARS-CoV-2 public health emergency.  Safety protocols were in place, including screening questions prior to the visit, additional usage of staff PPE, and extensive cleaning of exam room while observing appropriate contact time as indicated for disinfecting solutions.  Patient here for hospital follow up.  Was hospitalized 12/25/20 - 12/31/20.  Admitted and diagnosed with community acquired pneumonia.  Had been seen in urgent care prior to admission and diagnosed with RSV.  While in hospital was found to have a sputum culture significan for strep pneumonia.  CXR - multifocal pneumonia.  Started on ceftriaxone and azithromycin and completed 5 days of treatment.  Chest CT significant for bilateral pleural effusions with left pleural effusion concerning for possible empyema.  S/p left sided thoracentesis with labs not c/w empyema.  Gram stain - no organism and culture no growth.  In the hospital, she was requiring 4L of oxygen.  Prior to discharge, required 2L at rest and 3L with exertion.  Was discharged on home oxygen.  While in hospital - afib with RVR.  Resolved without need for IV antiarrhythmic medication.  Continues on eliquis and propranolol.  Since her discharge, she has been doing well.  Feels better.  Breathing better.  Not requiring oxygen now.  Has been off oxygen for one week.  Still using oxygen at night.  Still has occasional cough, but denies any significant coughing.  No chest pain.  Breathing stable.  No acid reflux.  No abdominal pain. Eating.  No nausea or vomiting.    Past Medical History:  Diagnosis Date  . Anemia   . Atrial fibrillation ([place])   . Atrial flutter ([place])   . Fibrocystic breast disease   . GERD (gastroesophageal reflux disease)   . History of chicken pox   .  Hypercholesterolemia   . Hypertension    Past Surgical History:  Procedure Laterality Date  . BREAST BIOPSY Right 2014   NEG  . BREAST LUMPECTOMY WITH SENTINEL LYMPH NODE BIOPSY Left 06/04/2020   Procedure: BREAST LUMPECTOMY WITH SENTINEL LYMPH NODE BX;  Surgeon: Robert Bellow, MD;  Location: [facility] ORS;  Service: General;  Laterality: Left;  . CATARACT EXTRACTION    . DILATION AND CURETTAGE OF UTERUS    . Laser repair of torn retina     Family History  Problem Relation Age of Onset  . Heart disease Mother 55       Heart attack  . Cancer Father 31       lung cancer  . Breast cancer Paternal Aunt        x3  . Colon cancer Neg Hx    Social History   Socioeconomic History  . Marital status: Widowed    Spouse name: Not on file  . Number of children: 2  . Years of education: Not on file  . Highest education level: Not on file  Occupational History  . Not on file  Tobacco Use  . Smoking status: Never Smoker  . Smokeless tobacco: Never Used  Vaping Use  . Vaping Use: Never used  Substance and Sexual Activity  . Alcohol use: No    Alcohol/week: 0.0 standard drinks  . Drug use: No  . Sexual activity: Not on file  Other Topics Concern  .  Not on file  Social History Narrative  . Not on file   Social Determinants of Health   Financial Resource Strain: Not on file  Food Insecurity: Not on file  Transportation Needs: Not on file  Physical Activity: Not on file  Stress: Not on file  Social Connections: Not on file    Outpatient Encounter Medications as of 01/12/2021  Medication Sig  . acetaminophen (TYLENOL) 650 MG CR tablet Take 650 mg by mouth every 8 (eight) hours as needed for pain.  Marland Kitchen amLODipine (NORVASC) 5 MG tablet TAKE 1 TABLET BY MOUTH ONCE DAILY AT [facility] TIME. CALL OUR OFFICE TO SCHEDULE APPOINTMENT FOR FURTHER REFILLS ON MEDICATIONS.  Marland Kitchen apixaban (ELIQUIS) 2.5 MG TABS tablet Take 1 tablet (2.5 mg total) by mouth 2 (two) times daily.  . Ascorbic Acid (VITAMIN  C) 250 MG CHEW Chew 250 mg by mouth daily.   . benzonatate (TESSALON) 100 MG capsule Take 2 capsules (200 mg total) by mouth 3 (three) times daily as needed for cough.  . calcium citrate-vitamin D 500-500 MG-UNIT chewable tablet Chew 1 tablet by mouth daily.  . famotidine (PEPCID) 20 MG tablet Take 20 mg by mouth daily.  Marland Kitchen letrozole (FEMARA) 2.5 MG tablet Take 2.5 mg by mouth daily.  Marland Kitchen losartan (COZAAR) 100 MG tablet TAKE 1 TABLET BY MOUTH ONCE DAILY  . Multiple Vitamin (MULTIVITAMIN) tablet Take 1 tablet by mouth daily.  . ondansetron (ZOFRAN ODT) 8 MG disintegrating tablet Take 1 tablet (8 mg total) by mouth every 8 (eight) hours as needed for nausea or vomiting.  . propranolol (INDERAL) 10 MG tablet Take 10 mg by mouth 3 (three) times daily as needed (heart rate). Take 1st for rapid heart rate. If no response after 1 hour take diltiazem  . sertraline (ZOLOFT) 50 MG tablet TAKE 1 TABLET BY MOUTH AT BEDTIME   No facility-administered encounter medications on file as of 01/12/2021.    Review of Systems  Constitutional: Negative for fever.       Eating.  Appetite improving.   HENT: Negative for congestion and sinus pressure.   Respiratory: Negative for chest tightness and shortness of breath.        Minimal cough.   Cardiovascular: Negative for chest pain, palpitations and leg swelling.  Gastrointestinal: Negative for abdominal pain, diarrhea, nausea and vomiting.  Genitourinary: Negative for difficulty urinating and dysuria.  Musculoskeletal: Negative for joint swelling and myalgias.  Skin: Negative for color change and rash.  Neurological: Negative for dizziness, light-headedness and headaches.  Psychiatric/Behavioral: Negative for agitation and dysphoric mood.       Objective:    Physical Exam Vitals reviewed.  Constitutional:      General: She is not in acute distress.    Appearance: Normal appearance.  HENT:     Head: Normocephalic and atraumatic.     Right Ear: External ear  normal.     Left Ear: External ear normal.  Eyes:     General: No scleral icterus.       Right eye: No discharge.        Left eye: No discharge.     Conjunctiva/sclera: Conjunctivae normal.  Neck:     Thyroid: No thyromegaly.  Cardiovascular:     Rate and Rhythm: Normal rate and regular rhythm.  Pulmonary:     Effort: No respiratory distress.     Breath sounds: Normal breath sounds. No wheezing.  Abdominal:     General: Bowel sounds are normal.  Palpations: Abdomen is soft.     Tenderness: There is no abdominal tenderness.  Musculoskeletal:        General: No swelling or tenderness.     Cervical back: Neck supple. No tenderness.  Lymphadenopathy:     Cervical: No cervical adenopathy.  Skin:    Findings: No erythema or rash.  Neurological:     Mental Status: She is alert.  Psychiatric:        Mood and Affect: Mood normal.        Behavior: Behavior normal.     BP 122/72   Pulse 78   Temp 98.3 F (36.8 C) (Oral)   Resp 16   Ht 5\' 7"  (1.702 m)   Wt 126 lb 3.2 oz (57.2 kg)   LMP 10/27/1989   SpO2 98%   BMI 19.77 kg/m  Wt Readings from Last 3 Encounters:  01/12/21 126 lb 3.2 oz (57.2 kg)  12/26/20 131 lb 2.8 oz (59.5 kg)  12/22/20 130 lb (59 kg)     Lab Results  Component Value Date   WBC 3.9 (L) 01/14/2021   HGB 11.1 (L) 01/14/2021   HCT 34.3 (L) 01/14/2021   PLT 423 (H) 01/14/2021   GLUCOSE 89 01/12/2021   CHOL 216 (H) 12/02/2020   TRIG 154.0 (H) 12/02/2020   HDL 68.10 12/02/2020   LDLDIRECT 117.7 11/06/2013   LDLCALC 117 (H) 12/02/2020   ALT 28 12/26/2020   AST 38 12/26/2020   NA 138 01/12/2021   K 4.9 01/14/2021   CL 103 01/12/2021   CREATININE 0.76 01/12/2021   BUN 17 01/12/2021   CO2 29 01/12/2021   TSH 3.13 12/02/2020   HGBA1C 5.9 (H) 12/28/2020    DG Chest Portable 1 View  Result Date: 12/25/2020 CLINICAL DATA:  Shortness of breath for 2 days EXAM: PORTABLE CHEST 1 VIEW COMPARISON:  12/22/2020 FINDINGS: Single frontal view of the chest  demonstrates interval development of multifocal bilateral airspace disease, left greater than right. Trace left pleural effusion. No pneumothorax. Cardiac silhouette is unremarkable. No acute bony abnormalities. IMPRESSION: 1. Interval development of bilateral airspace disease, left greater than right, with trace left pleural effusion. Findings could reflect asymmetric edema or multifocal pneumonia. Electronically Signed   By: Randa Ngo M.D.   On: 12/25/2020 20:50       Assessment & Plan:   Problem List Items Addressed This Visit    Acute respiratory failure with hypoxia (HCC)    Required oxygen in hospital.  Still sleeping with oxygen.  Pulse ox 97% on room air after ambulating today.  Will schedule overnight oximetry to see if still requires oxygen.  Follow.       Anemia    Noted to have mild anemia in hospital.  Recheck cbc today.       Relevant Orders   CBC with Differential/Platelet (Completed)   Atrial fibrillation (HCC)    Atrial fib with RVR in hospital.  Resolved without IV antiarrhythmic.  Has propranolol if needed.  Follow.       Essential hypertension - Primary    Continues on amlodipine and losartan.  Has propranolol if needed.  Follow pressures.  Follow metabolic panel.       Relevant Orders   Basic metabolic panel (Completed)   Hyponatremia    Mildly decreased in the hospital.  Recheck today to confirm wnl.       SOB (shortness of breath)    Breathing better.  Denies any increased sob.  Streptococcal pneumonia ([place])    Recently admitted with CAP.  Required oxygen in hospital.  Discharged on oxygen.  Using oxygen at night.  Not using oxygen during the day - pulse ox 97-99% - when up and moving.  Minimal cough.  No chest tightness.  Denies any sob.  Off abx now. Will check cxr in a few weeks to confirm clearance. Will contact oxygen company and obtain an overnight pulse ox to see if oxygen required while sleeping.  Pulse ox after ambulating today - 97% on  room air.            Einar Pheasant, MD

## 2021-01-13 ENCOUNTER — Other Ambulatory Visit: Payer: Self-pay

## 2021-01-13 ENCOUNTER — Other Ambulatory Visit: Payer: Self-pay | Admitting: Internal Medicine

## 2021-01-13 DIAGNOSIS — E875 Hyperkalemia: Secondary | ICD-10-CM

## 2021-01-13 DIAGNOSIS — I10 Essential (primary) hypertension: Secondary | ICD-10-CM

## 2021-01-13 DIAGNOSIS — J9601 Acute respiratory failure with hypoxia: Secondary | ICD-10-CM

## 2021-01-13 NOTE — Progress Notes (Signed)
Order placed for f/u stat potassium.  

## 2021-01-13 NOTE — Telephone Encounter (Signed)
Spoke with [facility]. The turn around time for overnight oximetry is 24-48 hours for the equipment being shipped out. I have ordered the oximetry test, printed demographics and insurance. Will send over with note and notify patient that this process has been started.

## 2021-01-14 ENCOUNTER — Other Ambulatory Visit
Admission: RE | Admit: 2021-01-14 | Discharge: 2021-01-14 | Disposition: A | Discharge status: Home / Self Care | Payer: Medicare Other | Source: Ambulatory Visit | Attending: Internal Medicine | Admitting: Internal Medicine

## 2021-01-14 DIAGNOSIS — E875 Hyperkalemia: Secondary | ICD-10-CM | POA: Diagnosis present

## 2021-01-14 DIAGNOSIS — I10 Essential (primary) hypertension: Secondary | ICD-10-CM | POA: Diagnosis present

## 2021-01-14 LAB — CBC WITH DIFFERENTIAL/PLATELET
Abs Immature Granulocytes: 0.01 10*3/uL (ref 0.00–0.07)
Basophils Absolute: 0.1 10*3/uL (ref 0.0–0.1)
Basophils Relative: 2 %
Eosinophils Absolute: 0.2 10*3/uL (ref 0.0–0.5)
Eosinophils Relative: 6 %
HCT: 34.3 % — ABNORMAL LOW (ref 36.0–46.0)
Hemoglobin: 11.1 g/dL — ABNORMAL LOW (ref 12.0–15.0)
Immature Granulocytes: 0 %
Lymphocytes Relative: 18 %
Lymphs Abs: 0.7 10*3/uL (ref 0.7–4.0)
MCH: 31.4 pg (ref 26.0–34.0)
MCHC: 32.4 g/dL (ref 30.0–36.0)
MCV: 96.9 fL (ref 80.0–100.0)
Monocytes Absolute: 0.5 10*3/uL (ref 0.1–1.0)
Monocytes Relative: 13 %
Neutro Abs: 2.4 10*3/uL (ref 1.7–7.7)
Neutrophils Relative %: 61 %
Platelets: 423 10*3/uL — ABNORMAL HIGH (ref 150–400)
RBC: 3.54 MIL/uL — ABNORMAL LOW (ref 3.87–5.11)
RDW: 14.6 % (ref 11.5–15.5)
WBC: 3.9 10*3/uL — ABNORMAL LOW (ref 4.0–10.5)
nRBC: 0 % (ref 0.0–0.2)

## 2021-01-14 LAB — POTASSIUM: Potassium: 4.9 mmol/L (ref 3.5–5.1)

## 2021-01-15 ENCOUNTER — Encounter: Payer: Self-pay | Admitting: Internal Medicine

## 2021-01-15 ENCOUNTER — Other Ambulatory Visit: Payer: Self-pay | Admitting: Internal Medicine

## 2021-01-15 DIAGNOSIS — I4891 Unspecified atrial fibrillation: Secondary | ICD-10-CM | POA: Insufficient documentation

## 2021-01-15 DIAGNOSIS — D649 Anemia, unspecified: Secondary | ICD-10-CM

## 2021-01-15 NOTE — Assessment & Plan Note (Signed)
Recently admitted with CAP.  Required oxygen in hospital.  Discharged on oxygen.  Using oxygen at night.  Not using oxygen during the day - pulse ox 97-99% - when up and moving.  Minimal cough.  No chest tightness.  Denies any sob.  Off abx now. Will check cxr in a few weeks to confirm clearance. Will contact oxygen company and obtain an overnight pulse ox to see if oxygen required while sleeping.  Pulse ox after ambulating today - 97% on room air.

## 2021-01-15 NOTE — Assessment & Plan Note (Signed)
Required oxygen in hospital.  Still sleeping with oxygen.  Pulse ox 97% on room air after ambulating today.  Will schedule overnight oximetry to see if still requires oxygen.  Follow.

## 2021-01-15 NOTE — Assessment & Plan Note (Signed)
Continues on amlodipine and losartan.  Has propranolol if needed.  Follow pressures.  Follow metabolic panel.

## 2021-01-15 NOTE — Assessment & Plan Note (Signed)
Mildly decreased in the hospital.  Recheck today to confirm wnl.

## 2021-01-15 NOTE — Progress Notes (Signed)
Order placed for cbc and iron studies.

## 2021-01-15 NOTE — Telephone Encounter (Signed)
Overnight oximetry order has been faxed.

## 2021-01-15 NOTE — Assessment & Plan Note (Signed)
Noted to have mild anemia in hospital.  Recheck cbc today.

## 2021-01-15 NOTE — Assessment & Plan Note (Signed)
Atrial fib with RVR in hospital.  Resolved without IV antiarrhythmic.  Has propranolol if needed.  Follow.

## 2021-01-15 NOTE — Assessment & Plan Note (Signed)
Breathing better.  Denies any increased sob.

## 2021-01-20 ENCOUNTER — Other Ambulatory Visit: Payer: Self-pay | Admitting: Cardiovascular Disease

## 2021-01-20 NOTE — Telephone Encounter (Signed)
Prescription refill request for Eliquis received. Indication:Atrial Fib Last office visit: 01/21/20  Next appt 03/05/21 Scr: 0.76 on 01/12/21 Age: 87 Weight: 59.2kg  Based on above findings Eliquis 2.5mg  twice daily is the appropriate dose.  Refill approved.

## 2021-01-20 NOTE — Telephone Encounter (Signed)
Scheduled 6/2

## 2021-01-20 NOTE — Telephone Encounter (Signed)
Please review for refill, Thanks !  

## 2021-01-20 NOTE — Telephone Encounter (Signed)
Please contact pt for future appointment. Pt due for 12 month f/u. Pt needing refills, thank you.

## 2021-01-28 ENCOUNTER — Telehealth: Payer: Self-pay

## 2021-01-28 NOTE — Telephone Encounter (Signed)
Overnight oximetry test results received. Placed in results folder for review

## 2021-01-28 NOTE — Telephone Encounter (Signed)
Reviewed overnight oximetry - does not qualify for oxygen.  Please notify pt and notify company - can d/c oxygen.  Oximetry study is in box in case you need to send copy to company.

## 2021-01-29 NOTE — Telephone Encounter (Signed)
Patient is aware and stated the oxygen company has already contacted her. Planning to come pick up supplies Friday morning. I have printed order to D/C just in case it is needed.

## 2021-02-04 ENCOUNTER — Ambulatory Visit
Admission: RE | Admit: 2021-02-04 | Discharge: 2021-02-04 | Disposition: A | Discharge status: Home / Self Care | Payer: Medicare Other | Source: Ambulatory Visit | Attending: General Surgery | Admitting: General Surgery

## 2021-02-04 ENCOUNTER — Other Ambulatory Visit: Payer: Self-pay

## 2021-02-04 DIAGNOSIS — Z13820 Encounter for screening for osteoporosis: Secondary | ICD-10-CM | POA: Diagnosis not present

## 2021-02-04 DIAGNOSIS — M810 Age-related osteoporosis without current pathological fracture: Secondary | ICD-10-CM | POA: Insufficient documentation

## 2021-02-04 DIAGNOSIS — C50912 Malignant neoplasm of unspecified site of left female breast: Secondary | ICD-10-CM | POA: Diagnosis present

## 2021-02-04 DIAGNOSIS — Z780 Asymptomatic menopausal state: Secondary | ICD-10-CM | POA: Insufficient documentation

## 2021-02-04 DIAGNOSIS — M069 Rheumatoid arthritis, unspecified: Secondary | ICD-10-CM | POA: Diagnosis not present

## 2021-02-09 ENCOUNTER — Telehealth: Payer: Self-pay | Admitting: Internal Medicine

## 2021-02-09 ENCOUNTER — Other Ambulatory Visit (INDEPENDENT_AMBULATORY_CARE_PROVIDER_SITE_OTHER): Payer: Medicare Other

## 2021-02-09 ENCOUNTER — Other Ambulatory Visit: Payer: Self-pay

## 2021-02-09 DIAGNOSIS — D649 Anemia, unspecified: Secondary | ICD-10-CM

## 2021-02-09 DIAGNOSIS — E875 Hyperkalemia: Secondary | ICD-10-CM | POA: Diagnosis not present

## 2021-02-09 DIAGNOSIS — R9389 Abnormal findings on diagnostic imaging of other specified body structures: Secondary | ICD-10-CM

## 2021-02-09 LAB — POTASSIUM: Potassium: 4.7 mEq/L (ref 3.5–5.1)

## 2021-02-09 LAB — CBC WITH DIFFERENTIAL/PLATELET
Basophils Absolute: 0.1 10*3/uL (ref 0.0–0.1)
Basophils Relative: 1.2 % (ref 0.0–3.0)
Eosinophils Absolute: 0.2 10*3/uL (ref 0.0–0.7)
Eosinophils Relative: 4.3 % (ref 0.0–5.0)
HCT: 36.7 % (ref 36.0–46.0)
Hemoglobin: 12.5 g/dL (ref 12.0–15.0)
Lymphocytes Relative: 18.4 % (ref 12.0–46.0)
Lymphs Abs: 0.8 10*3/uL (ref 0.7–4.0)
MCHC: 34 g/dL (ref 30.0–36.0)
MCV: 95.7 fl (ref 78.0–100.0)
Monocytes Absolute: 0.5 10*3/uL (ref 0.1–1.0)
Monocytes Relative: 10.2 % (ref 3.0–12.0)
Neutro Abs: 3 10*3/uL (ref 1.4–7.7)
Neutrophils Relative %: 65.9 % (ref 43.0–77.0)
Platelets: 249 10*3/uL (ref 150.0–400.0)
RBC: 3.84 Mil/uL — ABNORMAL LOW (ref 3.87–5.11)
RDW: 15.3 % (ref 11.5–15.5)
WBC: 4.5 10*3/uL (ref 4.0–10.5)

## 2021-02-09 LAB — IBC + FERRITIN
Ferritin: 95.5 ng/mL (ref 10.0–291.0)
Iron: 161 ug/dL — ABNORMAL HIGH (ref 42–145)
Saturation Ratios: 42.4 % (ref 20.0–50.0)
Transferrin: 271 mg/dL (ref 212.0–360.0)

## 2021-02-09 NOTE — Telephone Encounter (Signed)
[facility] called to about patient being there for a x-ray. At the time of call Dr.Scott was in a exam room. [facility] was advised to send patient home and Dr Bary Leriche office would contact patient about any x-ray orders.

## 2021-02-09 NOTE — Telephone Encounter (Signed)
Spoke with the Patient and informed of the below. She states she [facility] and will have the chest x-ray done at the hospital then.

## 2021-02-09 NOTE — Telephone Encounter (Signed)
CXR has been ordered to be done at [facility] - if she goes this week.  If desires to have here, I will need to change the facility. Apologize for the confusion.

## 2021-02-09 NOTE — Telephone Encounter (Signed)
patient was here to get x-ray and there is no order in and she is on her way to [facility] because we are not doing x-ray this week

## 2021-02-11 ENCOUNTER — Other Ambulatory Visit: Payer: Self-pay | Admitting: Internal Medicine

## 2021-02-11 NOTE — Progress Notes (Signed)
Labs ordered.

## 2021-02-12 ENCOUNTER — Ambulatory Visit
Admission: RE | Admit: 2021-02-12 | Discharge: 2021-02-12 | Disposition: A | Discharge status: Home / Self Care | Payer: Medicare Other | Attending: Internal Medicine | Admitting: Internal Medicine

## 2021-02-12 ENCOUNTER — Ambulatory Visit
Admission: RE | Admit: 2021-02-12 | Discharge: 2021-02-12 | Disposition: A | Discharge status: Home / Self Care | Payer: Medicare Other | Source: Ambulatory Visit | Attending: Internal Medicine | Admitting: Internal Medicine

## 2021-02-12 ENCOUNTER — Other Ambulatory Visit: Payer: Self-pay

## 2021-02-12 DIAGNOSIS — R9389 Abnormal findings on diagnostic imaging of other specified body structures: Secondary | ICD-10-CM

## 2021-02-19 ENCOUNTER — Other Ambulatory Visit: Payer: Self-pay | Admitting: General Surgery

## 2021-02-19 ENCOUNTER — Ambulatory Visit
Admission: RE | Admit: 2021-02-19 | Discharge: 2021-02-19 | Disposition: A | Discharge status: Home / Self Care | Payer: Medicare Other | Source: Ambulatory Visit | Attending: Radiation Oncology | Admitting: Radiation Oncology

## 2021-02-19 ENCOUNTER — Other Ambulatory Visit: Payer: Self-pay

## 2021-02-19 VITALS — BP 141/71 | HR 55 | Temp 98.0°F | Wt 126.3 lb

## 2021-02-19 DIAGNOSIS — C50412 Malignant neoplasm of upper-outer quadrant of left female breast: Secondary | ICD-10-CM | POA: Diagnosis present

## 2021-02-19 DIAGNOSIS — Z79811 Long term (current) use of aromatase inhibitors: Secondary | ICD-10-CM | POA: Insufficient documentation

## 2021-02-19 DIAGNOSIS — Z923 Personal history of irradiation: Secondary | ICD-10-CM | POA: Insufficient documentation

## 2021-02-19 DIAGNOSIS — Z170 Estrogen receptor positive status [ER+]: Secondary | ICD-10-CM | POA: Diagnosis not present

## 2021-02-19 DIAGNOSIS — C50912 Malignant neoplasm of unspecified site of left female breast: Secondary | ICD-10-CM

## 2021-02-19 NOTE — Addendum Note (Signed)
Addended byHervey Ard on: 02/19/2021 08:38 AM   Modules accepted: Orders

## 2021-02-19 NOTE — Progress Notes (Signed)
Radiation Oncology Follow up Note  Name: Anita Benjamin   Date:   02/19/2021 MRN:  [id number] DOB: May 08, 1934    This 87 y.o. female presents to the clinic today for 80-month follow-up status post whole breast radiation to her left breast for stage Ia (T1b N0 M0) invasive mammary carcinoma ER positive.Marland Kitchen  REFERRING PROVIDER: Einar Pheasant, MD  HPI: Patient is an 87 year old female now out 6 months having completed whole breast radiation to her left breast for stage Ia invasive mammary carcinoma ER positive seen today in routine follow-up she is doing well she specifically denies breast tenderness cough or bone pain..  She is currently on Femara tolerating it well without side effect.  She is not yet had mammograms that are being ordered by Dr. Tollie Pizza who is following her.  She did have a CT scan back in March showing bilateral upper and lower groundglass airspace densities compatible with multifocal pneumonia.  No evidence of metastatic disease.  Breasts seem within normal limits.  COMPLICATIONS OF TREATMENT: none  FOLLOW UP COMPLIANCE: keeps appointments   PHYSICAL EXAM:  BP (!) 141/71   Pulse (!) 55   Temp 98 F (36.7 C) (Tympanic)   Wt 126 lb 4.8 oz (57.3 kg)   LMP 10/27/1989   BMI 19.78 kg/m  Lungs are clear to A&P cardiac examination essentially unremarkable with regular rate and rhythm. No dominant mass or nodularity is noted in either breast in 2 positions examined. Incision is well-healed. No axillary or supraclavicular adenopathy is appreciated. Cosmetic result is excellent.  Well-developed well-nourished patient in NAD. HEENT reveals PERLA, EOMI, discs not visualized.  Oral cavity is clear. No oral mucosal lesions are identified. Neck is clear without evidence of cervical or supraclavicular adenopathy. Lungs are clear to A&P. Cardiac examination is essentially unremarkable with regular rate and rhythm without murmur rub or thrill. Abdomen is benign with no organomegaly or masses  noted. Motor sensory and DTR levels are equal and symmetric in the upper and lower extremities. Cranial nerves II through XII are grossly intact. Proprioception is intact. No peripheral adenopathy or edema is identified. No motor or sensory levels are noted. Crude visual fields are within normal range.  RADIOLOGY RESULTS: CT scan of the chest reviewed compatible with above-stated findings PLAN: Present time patient is doing well with no evidence of disease 6 months out.  I making sure she knows her follow-up appoint with Dr. Tollie Pizza.  I have otherwise asked to see her back in 6 months for follow-up.  She continues on letrozole without side effect.  Dr. Tollie Pizza will be following up with her and ordering her mammograms.  Patient knows to call with any concerns.  I would like to take this opportunity to thank you for allowing me to participate in the care of your patient.Noreene Filbert, MD

## 2021-02-24 ENCOUNTER — Other Ambulatory Visit: Payer: Self-pay | Admitting: Cardiovascular Disease

## 2021-02-24 ENCOUNTER — Other Ambulatory Visit: Payer: Self-pay | Admitting: Internal Medicine

## 2021-03-03 NOTE — Progress Notes (Signed)
Cardiology Office Note  Date:  03/05/2021   ID:  Oluwatosin Bracy, DOB 01-01-1934, MRN [id number]  PCP:  Einar Pheasant, MD   Chief Complaint  Patient presents with  . 12 month follow up     "doing well." Medications reviewed by the patient verbally.     HPI:  Ms. Cloninger is a very pleasant 87 y.o. woman, with a history of  tachycardia, SVT episodes,  atrial flutter, hypotension. several year history, possibly even up to 10 years of tachyarrhythmia. High BP in doctor offices Bradycardia 2019 on beta-blocker and amiodarone for flutter She presents today for follow-up of her tachycardia and chest pain  Breast surgery completed, lumpectomy  Had PNA 12/3031 Fever, RSV, hypoxia sats 70% probably got from grandchildren Went into atrial fib 12/29/20, rate 130 bpm Converted to NSR  No chest pain, no SOB  Takes one propranolol in the AM, 10 mg Denies having significant breakthrough in her tachyarrhythmia later in the day or overnight Is not taking extra propranolol for breakthrough but realizes that she can do that  EKG personally reviewed by myself on todays visit Shows normal sinus rhythm 63 bpm.  No significant ST or T wave changes noted  Other past medical history reviewed  syncope 05/23/2019  occurring after eating a spicy sandwich leading to abdominal pain and diarrhea.  feeling sick Felt to be vasovagal syncope was bending over and does not remember falling.  Came to on the ground.  Did not feel bad prior to this last episode.   echo and carotid ultrasound in 10.2020.    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  (possibly amiodarone).  Off amiodarone now.   Echocardiogram, 1. Left ventricular ejection fraction, by visual estimation, is 60 to 65%. The left ventricle has normal function. Normal left ventricular size. There is no left ventricular hypertrophy.  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  Normal carotid ultrasound.    November 2019 for atrial flutter, started on amiodarone, early later converted to normal sinus rhythm Metoprolol held for bradycardia Remains on amiodarone 200 mg daily   10/01/2016 she had atrial flutter rate 120 bpm  started on amiodarone, metoprolol, eliquis.   PMH:   has a past medical history of Anemia, Atrial fibrillation ([place]), Atrial flutter ([place]), Fibrocystic breast disease, GERD (gastroesophageal reflux disease), History of chicken pox, Hypercholesterolemia, and Hypertension.  PSH:    Past Surgical History:  Procedure Laterality Date  . BREAST BIOPSY Right 2014   NEG  . BREAST LUMPECTOMY WITH SENTINEL LYMPH NODE BIOPSY Left 06/04/2020   Procedure: BREAST LUMPECTOMY WITH SENTINEL LYMPH NODE BX;  Surgeon: Robert Bellow, MD;  Location: [facility] ORS;  Service: General;  Laterality: Left;  . CATARACT EXTRACTION    . DILATION AND CURETTAGE OF UTERUS    . Laser repair of torn retina      Current Outpatient Medications  Medication Sig Dispense Refill  . acetaminophen (TYLENOL) 650 MG CR tablet Take 650 mg by mouth every 8 (eight) hours as needed for pain.    Marland Kitchen amLODipine (NORVASC) 5 MG tablet TAKE 1 TABLET BY MOUTH ONCE DAILY AT DINNER TIME 30 tablet 0  . Ascorbic Acid (VITAMIN C) 250 MG CHEW Chew 250 mg by mouth daily.     . benzonatate (TESSALON) 100 MG capsule Take 2 capsules (200 mg total)  by mouth 3 (three) times daily as needed for cough. 20 capsule 0  . calcium citrate-vitamin D 500-500 MG-UNIT chewable tablet Chew 1 tablet by mouth daily.    Marland Kitchen ELIQUIS 2.5 MG TABS tablet TAKE 1 TABLET BY MOUTH TWICE DAILY 180 tablet 1  . famotidine (PEPCID) 20 MG tablet Take 20 mg by mouth daily.    Marland Kitchen letrozole (FEMARA) 2.5 MG tablet Take 2.5 mg by mouth daily.    Marland Kitchen  losartan (COZAAR) 100 MG tablet TAKE 1 TABLET BY MOUTH ONCE DAILY 30 tablet 2  . Multiple Vitamin (MULTIVITAMIN) tablet Take 1 tablet by mouth daily.    . propranolol (INDERAL) 10 MG tablet Take 10 mg by mouth 3 (three) times daily as needed (heart rate). Take 1st for rapid heart rate. If no response after 1 hour take diltiazem    . sertraline (ZOLOFT) 50 MG tablet TAKE 1 TABLET BY MOUTH AT BEDTIME 30 tablet 2   No current facility-administered medications for this visit.     Allergies:   Oruvail [ketoprofen], Relafen [nabumetone], and Timentin [ticarcillin-pot clavulanate]   Social History:  The patient  reports that she has never smoked. She has never used smokeless tobacco. She reports that she does not drink alcohol and does not use drugs.   Family History:   family history includes Breast cancer in her paternal aunt; Cancer (age of onset: 20) in her father; Heart disease (age of onset: 77) in her mother.    Review of Systems: Review of Systems  Constitutional: Negative.   HENT: Negative.   Eyes: Negative.   Respiratory: Negative.   Cardiovascular: Negative.  Negative for palpitations.       Paroxysmal tachycardia  Gastrointestinal: Negative.   Genitourinary: Negative.   Musculoskeletal: Negative.   Neurological: Negative.   Psychiatric/Behavioral: Negative.   All other systems reviewed and are negative.   PHYSICAL EXAM: VS:  BP 140/60 (BP Location: Left Arm, Patient Position: Sitting, Cuff Size: Normal)   Pulse 63   Ht 5' 6.5" (1.689 m)   Wt 128 lb (58.1 kg)   LMP 10/27/1989   SpO2 98%   BMI 20.35 kg/m  , BMI Body mass index is 20.35 kg/m.  Constitutional:  oriented to person, place, and time. No distress.  HENT:  Head: Grossly normal Eyes:  no discharge. No scleral icterus.  Neck: No JVD, no carotid bruits  Cardiovascular: Regular rate and rhythm, no murmurs appreciated Pulmonary/Chest: Clear to auscultation bilaterally, no wheezes or rails Abdominal: Soft.  no  distension.  no tenderness.  Musculoskeletal: Normal range of motion Neurological:  normal muscle tone. Coordination normal. No atrophy Skin: Skin warm and dry Psychiatric: normal affect, pleasant  Recent Labs: 12/02/2020: TSH 3.13 12/25/2020: B Natriuretic Peptide 216.5 12/26/2020: ALT 28 12/31/2020: Magnesium 2.1 01/12/2021: BUN 17; Creatinine, Ser 0.76; Sodium 138 02/09/2021: Hemoglobin 12.5; Platelets 249.0; Potassium 4.7    Lipid Panel Lab Results  Component Value Date   CHOL 216 (H) 12/02/2020   HDL 68.10 12/02/2020   LDLCALC 117 (H) 12/02/2020   TRIG 154.0 (H) 12/02/2020      Wt Readings from Last 3 Encounters:  03/05/21 128 lb (58.1 kg)  02/19/21 126 lb 4.8 oz (57.3 kg)  01/12/21 126 lb 3.2 oz (57.2 kg)     ASSESSMENT AND PLAN:  Syncope Prior episode vasovagal syncope No further episodes Blood pressure stable  Thyroid nodules Seen on carotid ultrasound,  thyroid ultrasound: Mild thyromegaly with bilateral cysts and complex cysts.   Essential hypertension Blood  pressure is well controlled on today's visit. No changes made to the medications.  Typical atrial flutter (HCC) Continue anticoagulation Symptoms better with propranolol 10 mg every morning Metoprolol succinate and diltiazem off her medication list Take extra propranolol as needed for breakthrough tachypalpitations  Chronic fatigue Poor sleep hygiene Stable  Chest pain No recent episodes  Encounter for anticoagulation discussion and counseling Continue Eliquis, no recent falls   Total encounter time more than 25 minutes  Greater than 50% was spent in counseling and coordination of care with the patient   No orders of the defined types were placed in this encounter.   Signed, Esmond Plants, M.D., Ph.D. 03/05/2021  [place], [place]

## 2021-03-05 ENCOUNTER — Other Ambulatory Visit: Payer: Self-pay

## 2021-03-05 ENCOUNTER — Ambulatory Visit (INDEPENDENT_AMBULATORY_CARE_PROVIDER_SITE_OTHER): Payer: Medicare Other | Admitting: Cardiovascular Disease

## 2021-03-05 ENCOUNTER — Encounter: Payer: Self-pay | Admitting: Cardiovascular Disease

## 2021-03-05 VITALS — BP 140/60 | HR 63 | Ht 66.5 in | Wt 128.0 lb

## 2021-03-05 DIAGNOSIS — I483 Typical atrial flutter: Secondary | ICD-10-CM

## 2021-03-05 DIAGNOSIS — I4891 Unspecified atrial fibrillation: Secondary | ICD-10-CM | POA: Diagnosis not present

## 2021-03-05 DIAGNOSIS — E7800 Pure hypercholesterolemia, unspecified: Secondary | ICD-10-CM

## 2021-03-05 DIAGNOSIS — R079 Chest pain, unspecified: Secondary | ICD-10-CM | POA: Diagnosis not present

## 2021-03-05 DIAGNOSIS — R55 Syncope and collapse: Secondary | ICD-10-CM | POA: Diagnosis not present

## 2021-03-05 DIAGNOSIS — I10 Essential (primary) hypertension: Secondary | ICD-10-CM

## 2021-03-05 NOTE — Patient Instructions (Signed)
Medication Instructions:  No changes  If you need a refill on your cardiac medications before your next appointment, please call your pharmacy.    Lab work: No new labs needed   If you have labs (blood work) drawn today and your tests are completely normal, you will receive your results only by: . MyChart Message (if you have MyChart) OR . A paper copy in the mail If you have any lab test that is abnormal or we need to change your treatment, we will call you to review the results.   Testing/Procedures: No new testing needed   Follow-Up: At [facility], you and your health needs are our priority.  As part of our continuing mission to provide you with exceptional heart care, we have created designated Provider Care Teams.  These Care Teams include your primary Cardiologist (physician) and Advanced Practice Providers (APPs -  Physician Assistants and Nurse Practitioners) who all work together to provide you with the care you need, when you need it.  . You will need a follow up appointment in 12 months  . Providers on your designated Care Team:   . Christopher Berge, NP . Ryan Dunn, PA-C . Jacquelyn Visser, PA-C  Any Other Special Instructions Will Be Listed Below (If Applicable).  COVID-19 Vaccine Information can be found at: [email or web address] For questions related to vaccine distribution or appointments, please email [email or web address] or call [phone].     

## 2021-03-12 ENCOUNTER — Other Ambulatory Visit (INDEPENDENT_AMBULATORY_CARE_PROVIDER_SITE_OTHER): Payer: Medicare Other

## 2021-03-12 ENCOUNTER — Other Ambulatory Visit: Payer: Self-pay

## 2021-03-12 LAB — IBC + FERRITIN
Ferritin: 77 ng/mL (ref 10.0–291.0)
Iron: 101 ug/dL (ref 42–145)
Saturation Ratios: 24.9 % (ref 20.0–50.0)
Transferrin: 290 mg/dL (ref 212.0–360.0)

## 2021-03-23 LAB — HEMOCHROMATOSIS DNA-PCR(C282Y,H63D)

## 2021-04-03 ENCOUNTER — Encounter: Payer: Self-pay | Admitting: Internal Medicine

## 2021-04-03 ENCOUNTER — Ambulatory Visit (INDEPENDENT_AMBULATORY_CARE_PROVIDER_SITE_OTHER): Payer: Medicare Other | Admitting: Internal Medicine

## 2021-04-03 ENCOUNTER — Other Ambulatory Visit: Payer: Self-pay

## 2021-04-03 VITALS — BP 124/58 | HR 67 | Temp 98.2°F | Ht 66.5 in | Wt 129.2 lb

## 2021-04-03 DIAGNOSIS — I483 Typical atrial flutter: Secondary | ICD-10-CM

## 2021-04-03 DIAGNOSIS — I10 Essential (primary) hypertension: Secondary | ICD-10-CM | POA: Diagnosis not present

## 2021-04-03 DIAGNOSIS — R739 Hyperglycemia, unspecified: Secondary | ICD-10-CM | POA: Diagnosis not present

## 2021-04-03 DIAGNOSIS — E7800 Pure hypercholesterolemia, unspecified: Secondary | ICD-10-CM

## 2021-04-03 DIAGNOSIS — C50919 Malignant neoplasm of unspecified site of unspecified female breast: Secondary | ICD-10-CM

## 2021-04-03 DIAGNOSIS — R519 Headache, unspecified: Secondary | ICD-10-CM

## 2021-04-03 DIAGNOSIS — Z0000 Encounter for general adult medical examination without abnormal findings: Secondary | ICD-10-CM | POA: Diagnosis not present

## 2021-04-03 DIAGNOSIS — M542 Cervicalgia: Secondary | ICD-10-CM

## 2021-04-03 DIAGNOSIS — I4891 Unspecified atrial fibrillation: Secondary | ICD-10-CM | POA: Diagnosis not present

## 2021-04-03 DIAGNOSIS — I700 Atherosclerosis of aorta: Secondary | ICD-10-CM

## 2021-04-03 DIAGNOSIS — Z148 Genetic carrier of other disease: Secondary | ICD-10-CM

## 2021-04-03 DIAGNOSIS — D649 Anemia, unspecified: Secondary | ICD-10-CM

## 2021-04-03 DIAGNOSIS — D72819 Decreased white blood cell count, unspecified: Secondary | ICD-10-CM

## 2021-04-03 DIAGNOSIS — F439 Reaction to severe stress, unspecified: Secondary | ICD-10-CM

## 2021-04-03 LAB — BASIC METABOLIC PANEL
BUN: 22 mg/dL (ref 6–23)
CO2: 30 mEq/L (ref 19–32)
Calcium: 10.6 mg/dL — ABNORMAL HIGH (ref 8.4–10.5)
Chloride: 101 mEq/L (ref 96–112)
Creatinine, Ser: 0.86 mg/dL (ref 0.40–1.20)
GFR: 61.75 mL/min (ref >60.00)
Glucose, Bld: 92 mg/dL (ref 70–99)
Potassium: 4.8 mEq/L (ref 3.5–5.1)
Sodium: 140 mEq/L (ref 135–145)

## 2021-04-03 LAB — CBC WITH DIFFERENTIAL/PLATELET
Basophils Absolute: 0 10*3/uL (ref 0.0–0.1)
Basophils Relative: 0.8 % (ref 0.0–3.0)
Eosinophils Absolute: 0.2 10*3/uL (ref 0.0–0.7)
Eosinophils Relative: 3.3 % (ref 0.0–5.0)
HCT: 40.4 % (ref 36.0–46.0)
Hemoglobin: 13.6 g/dL (ref 12.0–15.0)
Lymphocytes Relative: 22.1 % (ref 12.0–46.0)
Lymphs Abs: 1 10*3/uL (ref 0.7–4.0)
MCHC: 33.6 g/dL (ref 30.0–36.0)
MCV: 95.2 fl (ref 78.0–100.0)
Monocytes Absolute: 0.5 10*3/uL (ref 0.1–1.0)
Monocytes Relative: 11.8 % (ref 3.0–12.0)
Neutro Abs: 2.8 10*3/uL (ref 1.4–7.7)
Neutrophils Relative %: 62 % (ref 43.0–77.0)
Platelets: 254 10*3/uL (ref 150.0–400.0)
RBC: 4.24 Mil/uL (ref 3.87–5.11)
RDW: 13.3 % (ref 11.5–15.5)
WBC: 4.6 10*3/uL (ref 4.0–10.5)

## 2021-04-03 LAB — HEPATIC FUNCTION PANEL
ALT: 17 U/L (ref 0–35)
AST: 22 U/L (ref 0–37)
Albumin: 4.7 g/dL (ref 3.5–5.2)
Alkaline Phosphatase: 65 U/L (ref 39–117)
Bilirubin, Direct: 0.1 mg/dL (ref 0.0–0.3)
Total Bilirubin: 0.5 mg/dL (ref 0.2–1.2)
Total Protein: 7.4 g/dL (ref 6.0–8.3)

## 2021-04-03 LAB — LIPID PANEL
Cholesterol: 225 mg/dL — ABNORMAL HIGH (ref 0–200)
HDL: 67.2 mg/dL (ref >39.00)
NonHDL: 157.3
Total CHOL/HDL Ratio: 3
Triglycerides: 208 mg/dL — ABNORMAL HIGH (ref 0.0–149.0)
VLDL: 41.6 mg/dL — ABNORMAL HIGH (ref 0.0–40.0)

## 2021-04-03 LAB — HEMOGLOBIN A1C: Hgb A1c MFr Bld: 5.7 % (ref 4.6–6.5)

## 2021-04-03 LAB — LDL CHOLESTEROL, DIRECT: Direct LDL: 129 mg/dL

## 2021-04-03 LAB — TSH: TSH: 3.08 u[IU]/mL (ref 0.35–5.50)

## 2021-04-03 LAB — SEDIMENTATION RATE: Sed Rate: 4 mm/hr (ref 0–30)

## 2021-04-03 MED ORDER — METHYLPREDNISOLONE 4 MG PO TBPK: ORAL_TABLET | ORAL | 0 refills | Status: DC

## 2021-04-03 NOTE — Assessment & Plan Note (Addendum)
Physical today 04/03/21.  Saw GI.  cologuard negative.  Mammogram -being followed by radiation oncology and surgery.  Scheduled for follow-up.  Status post radiation treatments.

## 2021-04-03 NOTE — Progress Notes (Addendum)
Patient ID: Anita Benjamin, female   DOB: 16-Feb-1934, 87 y.o.   MRN: [id number]   Subjective:    Patient ID: Anita Benjamin, female    DOB: 07/06/1934, 87 y.o.   MRN: [id number]  HPI This visit occurred during the SARS-CoV-2 public health emergency.  Safety protocols were in place, including screening questions prior to the visit, additional usage of staff PPE, and extensive cleaning of exam room while observing appropriate contact time as indicated for disinfecting solutions.   Patient here for her physical exam.  She she reports that she has noticed increased neck and head pain.  She has had neck pain for a while.  C-spine x-ray October 2021 revealed diffuse multilevel facet hypertrophy throughout the cervical spine.  Mild degenerative disc disease at C6-C7.  Minimal degenerative anterolisthesis of C4 on C5.  She was referred to neurosurgery.  Given her radiation treatments she elected to postpone the appointment.  Persistent neck pain.  Limited range of motion.  Has noticed head pain now.  Describes it as a pounding type pain.  Actually question her she reports that extends up from her neck and goes to the top of her head.  If she sits and takes pressure off of her neck the pain resolves.  No other headache.  No lightheadedness or dizziness.  Noticed a little right ear pain intermittent.  No chest pain.  Feels overall from a cardiac standpoint that things are relatively stable.  Breathing stable.  No increased cough or congestion.  No acid reflux reported.  No abdominal pain or cramping.  No bowel change.  Discussed labs.  Previously was noted to have increased iron level.  In reviewing a previous CT scan had revealed a hyperdense liver (which could possibly be seen in hemochromatosis).  Recent labs revealed her to be a possible carrier.  Discussed this with her today.  Discussed referral to hematology.  Past Medical History:  Diagnosis Date   Anemia    Atrial fibrillation (HCC)    Atrial flutter (HCC)     Fibrocystic breast disease    GERD (gastroesophageal reflux disease)    History of chicken pox    Hypercholesterolemia    Hypertension    Past Surgical History:  Procedure Laterality Date   BREAST BIOPSY Right 2014   NEG   BREAST LUMPECTOMY WITH SENTINEL LYMPH NODE BIOPSY Left 06/04/2020   Procedure: BREAST LUMPECTOMY WITH SENTINEL LYMPH NODE BX;  Surgeon: Robert Bellow, MD;  Location: [facility] ORS;  Service: General;  Laterality: Left;   CATARACT EXTRACTION     DILATION AND CURETTAGE OF UTERUS     Laser repair of torn retina     Family History  Problem Relation Age of Onset   Heart disease Mother 81       Heart attack   Cancer Father 64       lung cancer   Breast cancer Paternal Aunt        x3   Colon cancer Neg Hx    Social History   Socioeconomic History   Marital status: Widowed    Spouse name: Not on file   Number of children: 2   Years of education: Not on file   Highest education level: Not on file  Occupational History   Not on file  Tobacco Use   Smoking status: Never   Smokeless tobacco: Never  Vaping Use   Vaping Use: Never used  Substance and Sexual Activity   Alcohol use: No    Alcohol/week:  0.0 standard drinks   Drug use: No   Sexual activity: Not on file  Other Topics Concern   Not on file  Social History Narrative   Not on file   Social Determinants of Health   Financial Resource Strain: Not on file  Food Insecurity: Not on file  Transportation Needs: Not on file  Physical Activity: Not on file  Stress: Not on file  Social Connections: Not on file    Review of Systems  Constitutional:  Negative for appetite change and unexpected weight change.  HENT:  Negative for congestion, sinus pressure and sore throat.   Eyes:  Negative for pain and visual disturbance.  Respiratory:  Negative for cough, chest tightness and shortness of breath.   Cardiovascular:  Negative for chest pain, palpitations and leg swelling.  Gastrointestinal:  Negative  for abdominal pain, diarrhea, nausea and vomiting.  Genitourinary:  Negative for difficulty urinating and dysuria.  Musculoskeletal:  Positive for neck pain. Negative for joint swelling and myalgias.  Skin:  Negative for color change, pallor and rash.  Neurological:  Negative for dizziness and light-headedness.       Head pain as outlined.   Hematological:  Negative for adenopathy. Does not bruise/bleed easily.  Psychiatric/Behavioral:  Negative for agitation and dysphoric mood.       Objective:    Physical Exam Vitals reviewed.  Constitutional:      General: She is not in acute distress.    Appearance: Normal appearance. She is well-developed.  HENT:     Head: Normocephalic and atraumatic.     Right Ear: External ear normal.     Left Ear: External ear normal.  Eyes:     General: No scleral icterus.       Right eye: No discharge.        Left eye: No discharge.     Conjunctiva/sclera: Conjunctivae normal.  Neck:     Thyroid: No thyromegaly.     Comments: Increased pain - neck - with looking from right to left.  Limited rom.   Cardiovascular:     Rate and Rhythm: Normal rate and regular rhythm.  Pulmonary:     Effort: No tachypnea, accessory muscle usage or respiratory distress.     Breath sounds: Normal breath sounds. No decreased breath sounds, wheezing or rhonchi.  Chest:  Breasts:    Right: No inverted nipple, mass, nipple discharge or tenderness (no axillary adenopathy).     Left: No inverted nipple, mass, nipple discharge or tenderness (no axilarry adenopathy).  Abdominal:     General: Bowel sounds are normal.     Palpations: Abdomen is soft.     Tenderness: There is no abdominal tenderness.  Musculoskeletal:        General: No swelling or tenderness.  Lymphadenopathy:     Cervical: No cervical adenopathy.  Skin:    Findings: No erythema or rash.  Neurological:     Mental Status: She is alert and oriented to person, place, and time.  Psychiatric:        Mood and  Affect: Mood normal.        Behavior: Behavior normal.    BP (!) 124/58   Pulse 67   Temp 98.2 F (36.8 C)   Ht 5' 6.5" (1.689 m)   Wt 129 lb 3.2 oz (58.6 kg)   LMP 10/27/1989   SpO2 95%   BMI 20.54 kg/m  Wt Readings from Last 3 Encounters:  04/03/21 129 lb 3.2 oz (58.6 kg)  03/05/21 128 lb (58.1 kg)  02/19/21 126 lb 4.8 oz (57.3 kg)    Outpatient Encounter Medications as of 04/03/2021  Medication Sig   acetaminophen (TYLENOL) 650 MG CR tablet Take 650 mg by mouth every 8 (eight) hours as needed for pain.   amLODipine (NORVASC) 5 MG tablet TAKE 1 TABLET BY MOUTH ONCE DAILY AT DINNER TIME   Ascorbic Acid (VITAMIN C) 250 MG CHEW Chew 250 mg by mouth daily.    calcium citrate-vitamin D 500-500 MG-UNIT chewable tablet Chew 3 tablets by mouth daily.   ELIQUIS 2.5 MG TABS tablet TAKE 1 TABLET BY MOUTH TWICE DAILY   famotidine (PEPCID) 20 MG tablet Take 20 mg by mouth daily.   letrozole (FEMARA) 2.5 MG tablet Take 2.5 mg by mouth daily.   losartan (COZAAR) 100 MG tablet TAKE 1 TABLET BY MOUTH ONCE DAILY   methylPREDNISolone (MEDROL DOSEPAK) 4 MG TBPK tablet Medrol dosepack 6 day taper.  Take as directed.   Multiple Vitamin (MULTIVITAMIN) tablet Take 1 tablet by mouth daily.   propranolol (INDERAL) 10 MG tablet Take 10 mg by mouth 3 (three) times daily as needed (heart rate). Take 1st for rapid heart rate. If no response after 1 hour take diltiazem   sertraline (ZOLOFT) 50 MG tablet TAKE 1 TABLET BY MOUTH AT BEDTIME   [DISCONTINUED] benzonatate (TESSALON) 100 MG capsule Take 2 capsules (200 mg total) by mouth 3 (three) times daily as needed for cough. (Patient not taking: Reported on 04/03/2021)   No facility-administered encounter medications on file as of 04/03/2021.     Lab Results  Component Value Date   WBC 4.6 04/03/2021   HGB 13.6 04/03/2021   HCT 40.4 04/03/2021   PLT 254.0 04/03/2021   GLUCOSE 92 04/03/2021   CHOL 225 (H) 04/03/2021   TRIG 208.0 (H) 04/03/2021   HDL 67.20  04/03/2021   LDLDIRECT 129.0 04/03/2021   LDLCALC 117 (H) 12/02/2020   ALT 17 04/03/2021   AST 22 04/03/2021   NA 140 04/03/2021   K 4.8 04/03/2021   CL 101 04/03/2021   CREATININE 0.86 04/03/2021   BUN 22 04/03/2021   CO2 30 04/03/2021   TSH 3.08 04/03/2021   HGBA1C 5.7 04/03/2021       Assessment & Plan:   Problem List Items Addressed This Visit     Anemia    Follow cbc.        Aortic atherosclerosis (HCC)    Check lipids today.  Discuss restarting cholesterol medication.         Atrial fibrillation ([place])    Appears to be in sinus rhythm now.  Instructed to take propranolol as needed.  Had questions about the dosing.  She is going to call cardiology for clarification.       Atrial flutter (HCC)    Continue Eliquis.  Has been instructed to take propranolol..  Stable.       Breast cancer ([place])    Recent diagnosis.  Status postlumpectomy.  Status post radiation therapy.  On for marrow.  Has follow-up mammogram scheduled.  Continue to follow-up with Dr. Bary Castilla.       Relevant Medications   methylPREDNISolone (MEDROL DOSEPAK) 4 MG TBPK tablet   Essential hypertension    Continues on amlodipine and losartan.  Blood pressure as outlined.  Had questions about her propranolol dosing.  Prescribed by cardiology.  She is going to call them for clarification.  Follow pressures.  Follow metabolic panel.  Relevant Orders   CBC with Differential/Platelet (Completed)   Basic metabolic panel (Completed)   TSH (Completed)   Headache    Head pain as outlined.  Based on the exam and her symptoms, it appears to be related to her neck.  Follow-up with neurosurgery as outlined.  We will check sed rate.       Relevant Orders   Sedimentation rate (Completed)   Health care maintenance    Physical today 04/03/21.  Saw GI.  cologuard negative.  Mammogram -being followed by radiation oncology and surgery.  Scheduled for follow-up.  Status post radiation treatments.        Hemochromatosis carrier    In reviewing a previous CT scan had revealed a hyperdense liver (which could possibly be seen in hemochromatosis). Recently found to have elevated iron.   Recent labs revealed her to be a possible carrier.  Discussed this with her today.  Discussed referral to hematology.       Relevant Orders   Ambulatory referral to Hematology / Oncology   Hypercholesterolemia    Off cholesterol medication.  Discuss restarting statin medication.  Check labs today.  Follow-up.       Relevant Orders   Lipid panel (Completed)   Hepatic function panel (Completed)   LDL cholesterol, direct (Completed)   Leukopenia    Follow CBC.       Neck pain    Persistent increased neck pain.  X-ray as outlined.  Now with head discomfort as outlined.  The head pain is relieved if she can relieve the pressure on her neck.  Felt like the head pain is related to her neck.  Recommended follow-up with neurosurgery.  Have already placed a referral.  She held on the appointment previously because of her radiation treatments.  Encouraged her to follow-up.       Stress    Continues on sotalol.  Appears to be doing well.  No changes.  Follow.        Other Visit Diagnoses     Routine general medical examination at a health care facility    -  Primary   Hyperglycemia       Relevant Orders   Hemoglobin A1c (Completed)        Einar Pheasant, MD

## 2021-04-04 ENCOUNTER — Encounter: Payer: Self-pay | Admitting: Internal Medicine

## 2021-04-05 ENCOUNTER — Encounter: Payer: Self-pay | Admitting: Internal Medicine

## 2021-04-05 DIAGNOSIS — I700 Atherosclerosis of aorta: Secondary | ICD-10-CM | POA: Insufficient documentation

## 2021-04-05 DIAGNOSIS — Z148 Genetic carrier of other disease: Secondary | ICD-10-CM | POA: Insufficient documentation

## 2021-04-05 NOTE — Assessment & Plan Note (Signed)
Check lipids today.  Discuss restarting cholesterol medication.

## 2021-04-05 NOTE — Assessment & Plan Note (Signed)
Head pain as outlined.  Based on the exam and her symptoms, it appears to be related to her neck.  Follow-up with neurosurgery as outlined.  We will check sed rate.

## 2021-04-05 NOTE — Assessment & Plan Note (Signed)
Continue Eliquis.  Has been instructed to take propranolol..  Stable.

## 2021-04-05 NOTE — Assessment & Plan Note (Signed)
Persistent increased neck pain.  X-ray as outlined.  Now with head discomfort as outlined.  The head pain is relieved if she can relieve the pressure on her neck.  Felt like the head pain is related to her neck.  Recommended follow-up with neurosurgery.  Have already placed a referral.  She held on the appointment previously because of her radiation treatments.  Encouraged her to follow-up.

## 2021-04-05 NOTE — Assessment & Plan Note (Signed)
Continues on amlodipine and losartan.  Blood pressure as outlined.  Had questions about her propranolol dosing.  Prescribed by cardiology.  She is going to call them for clarification.  Follow pressures.  Follow metabolic panel.

## 2021-04-05 NOTE — Assessment & Plan Note (Signed)
In reviewing a previous CT scan had revealed a hyperdense liver (which could possibly be seen in hemochromatosis). Recently found to have elevated iron.   Recent labs revealed her to be a possible carrier.  Discussed this with her today.  Discussed referral to hematology.

## 2021-04-05 NOTE — Assessment & Plan Note (Signed)
Follow cbc.  

## 2021-04-05 NOTE — Assessment & Plan Note (Signed)
Recent diagnosis.  Status postlumpectomy.  Status post radiation therapy.  On for marrow.  Has follow-up mammogram scheduled.  Continue to follow-up with Dr. Bary Castilla.

## 2021-04-05 NOTE — Assessment & Plan Note (Signed)
Appears to be in sinus rhythm now.  Instructed to take propranolol as needed.  Had questions about the dosing.  She is going to call cardiology for clarification.

## 2021-04-05 NOTE — Assessment & Plan Note (Signed)
Follow CBC. 

## 2021-04-05 NOTE — Assessment & Plan Note (Signed)
Off cholesterol medication.  Discuss restarting statin medication.  Check labs today.  Follow-up.

## 2021-04-05 NOTE — Assessment & Plan Note (Signed)
Continues on sotalol.  Appears to be doing well.  No changes.  Follow.

## 2021-04-07 ENCOUNTER — Other Ambulatory Visit: Payer: Self-pay | Admitting: Cardiovascular Disease

## 2021-04-07 ENCOUNTER — Other Ambulatory Visit: Payer: Self-pay | Admitting: Internal Medicine

## 2021-04-09 ENCOUNTER — Other Ambulatory Visit: Payer: Self-pay | Admitting: Cardiovascular Disease

## 2021-04-15 ENCOUNTER — Inpatient Hospital Stay: Payer: Medicare Other

## 2021-04-15 ENCOUNTER — Other Ambulatory Visit: Payer: Self-pay

## 2021-04-15 ENCOUNTER — Inpatient Hospital Stay: Payer: Medicare Other | Attending: Internal Medicine | Admitting: Internal Medicine

## 2021-04-15 DIAGNOSIS — Z803 Family history of malignant neoplasm of breast: Secondary | ICD-10-CM | POA: Insufficient documentation

## 2021-04-15 DIAGNOSIS — Z801 Family history of malignant neoplasm of trachea, bronchus and lung: Secondary | ICD-10-CM | POA: Diagnosis not present

## 2021-04-15 NOTE — Progress Notes (Signed)
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 This patient is negative for the  C282Y pathogenic variant. 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   Results for MIRA, BALON (MRN [id number]) as of 04/15/2021 11:56  Ref. Range 03/12/2021 11:09  Iron Latest Ref Range: 42 - 145 ug/dL 101  Saturation Ratios Latest Ref Range: 20.0 - 50.0 % 24.9  Ferritin Latest Ref Range: 10.0 - 291.0 ng/mL 77.0  Transferrin Latest Ref Range: 212.0 - 360.0 mg/dL 290.0    HISTORY OF PRESENTING ILLNESS:  Anita Benjamin 87 y.o.  female has been referred to [place] for further evaluation/work-up/ recommendations for hemochromatosis.  Patient on a CT scan of the abdomen pelvis in 2020 noted to have radiographic concerns for hemochromatosis.  This was recently worked up by genotyping for hemochromatosis that was positive heterozygous.  Patient also had iron studies done.  Patient denies any worsening joint pains.  Denies any nausea vomiting abdominal pain.  Any history of congestive heart failure.  Denies any worsening diabetes.  Review of Systems   Constitutional:  Negative for chills, diaphoresis, fever, malaise/fatigue and weight loss.  HENT:  Negative for nosebleeds and sore throat.   Eyes:  Negative for double vision.  Respiratory:  Negative for cough, hemoptysis, sputum production, shortness of breath and wheezing.   Cardiovascular:  Negative for chest pain, palpitations, orthopnea and leg swelling.  Gastrointestinal:  Negative for abdominal pain, blood in stool, constipation, diarrhea, heartburn, melena, nausea and vomiting.  Genitourinary:  Negative for dysuria, frequency and urgency.  Musculoskeletal:  Negative for back pain and joint pain.  Skin: Negative.  Negative for itching and rash.  Neurological:  Negative for dizziness, tingling, focal weakness, weakness and headaches.  Endo/Heme/Allergies:  Does not bruise/bleed easily.  Psychiatric/Behavioral:  Negative for depression. The patient is not nervous/anxious and does not have insomnia.    MEDICAL HISTORY:  Past Medical History:  Diagnosis Date   Anemia    Atrial fibrillation (HCC)    Atrial flutter (HCC)    Fibrocystic breast disease    GERD (gastroesophageal reflux disease)    History of chicken pox    Hypercholesterolemia    Hypertension     SURGICAL HISTORY: Past Surgical History:  Procedure Laterality Date   BREAST BIOPSY Right 2014   NEG   BREAST LUMPECTOMY     BREAST LUMPECTOMY WITH SENTINEL LYMPH NODE BIOPSY Left 06/04/2020   Procedure: BREAST LUMPECTOMY WITH SENTINEL LYMPH NODE BX;  Surgeon: Robert Bellow, MD;  Location: [facility] ORS;  Service: General;  Laterality: Left;   CATARACT EXTRACTION     DILATION AND CURETTAGE OF UTERUS     Laser repair of torn retina      SOCIAL HISTORY: Social History  Socioeconomic History   Marital status: Widowed    Spouse name: Not on file   Number of children: 2   Years of education: Not on file   Highest education level: Not on file  Occupational History   Not on file  Tobacco Use   Smoking status:  Never   Smokeless tobacco: Never  Vaping Use   Vaping Use: Never used  Substance and Sexual Activity   Alcohol use: No    Alcohol/week: 0.0 standard drinks   Drug use: No   Sexual activity: Not on file  Other Topics Concern   Not on file  Social History Narrative   Not on file   Social Determinants of Health   Financial Resource Strain: Not on file  Food Insecurity: Not on file  Transportation Needs: Not on file  Physical Activity: Not on file  Stress: Not on file  Social Connections: Not on file  Intimate Partner Violence: Not on file    FAMILY HISTORY: Family History  Problem Relation Age of Onset   Heart disease Mother 68       Heart attack   Cancer Father 60       lung cancer   Breast cancer Paternal Aunt        x3   Colon cancer Neg Hx     ALLERGIES:  is allergic to oruvail [ketoprofen], relafen [nabumetone], and timentin [ticarcillin-pot clavulanate].  MEDICATIONS:  Current Outpatient Medications  Medication Sig Dispense Refill   acetaminophen (TYLENOL) 650 MG CR tablet Take 650 mg by mouth every 8 (eight) hours as needed for pain.     Ascorbic Acid (VITAMIN C) 250 MG CHEW Chew 250 mg by mouth daily.      calcium citrate-vitamin D 500-500 MG-UNIT chewable tablet Chew 3 tablets by mouth daily.     famotidine (PEPCID) 20 MG tablet Take 20 mg by mouth daily.     letrozole (FEMARA) 2.5 MG tablet Take 2.5 mg by mouth daily.     losartan (COZAAR) 100 MG tablet TAKE 1 TABLET BY MOUTH ONCE DAILY 30 tablet 2   Multiple Vitamin (MULTIVITAMIN) tablet Take 1 tablet by mouth daily.     sertraline (ZOLOFT) 50 MG tablet TAKE 1 TABLET BY MOUTH AT BEDTIME 30 tablet 2   amLODipine (NORVASC) 5 MG tablet TAKE 1 TABLET BY MOUTH ONCE DAILY AT DINNER TIME 30 tablet 0   ELIQUIS 2.5 MG TABS tablet TAKE 1 TABLET BY MOUTH TWICE DAILY 180 tablet 1   methylPREDNISolone (MEDROL DOSEPAK) 4 MG TBPK tablet Medrol dosepack 6 day taper.  Take as directed. (Patient not taking: Reported on  04/15/2021) 21 tablet 0   propranolol (INDERAL) 10 MG tablet Take 1 tablet (10 mg total) by mouth 3 (three) times daily as needed (heart rate). Take one tab everyday,  may take extra for breakthrough elevated heart rate 270 tablet 3   No current facility-administered medications for this visit.      PHYSICAL EXAMINATION:   Vitals:   04/15/21 1128  BP: (!) 156/75  Pulse: 61  Resp: 18  Temp: 98.1 F (36.7 C)  SpO2: 99%   Filed Weights   04/15/21 1128  Weight: 128 lb (58.1 kg)    Physical Exam Vitals and nursing note reviewed.  Constitutional:      Comments: Alone.  Ambulating independently.  HENT:     Head: Normocephalic and atraumatic.     Mouth/Throat:     Mouth: Mucous membranes are moist.     Pharynx:  Oropharynx is clear. No oropharyngeal exudate.  Eyes:     Extraocular Movements: Extraocular movements intact.     Pupils: Pupils are equal, round, and reactive to light.  Cardiovascular:     Rate and Rhythm: Normal rate and regular rhythm.  Pulmonary:     Effort: No respiratory distress.     Breath sounds: No wheezing.     Comments: Decreased breath sounds bilaterally at bases.  No wheeze or crackles Abdominal:     General: Bowel sounds are normal. There is no distension.     Palpations: Abdomen is soft. There is no mass.     Tenderness: There is no abdominal tenderness. There is no guarding or rebound.  Musculoskeletal:        General: No tenderness. Normal range of motion.     Cervical back: Normal range of motion and neck supple.  Skin:    General: Skin is warm.  Neurological:     General: No focal deficit present.     Mental Status: She is alert and oriented to person, place, and time.  Psychiatric:        Mood and Affect: Affect normal.        Behavior: Behavior normal.        Judgment: Judgment normal.    LABORATORY DATA:  I have reviewed the data as listed Lab Results  Component Value Date   WBC 4.6 04/03/2021   HGB 13.6 04/03/2021   HCT 40.4  04/03/2021   MCV 95.2 04/03/2021   PLT 254.0 04/03/2021   Recent Labs    06/27/20 1546 07/28/20 0828 12/02/20 1001 12/05/20 1429 12/26/20 0505 12/27/20 0530 12/29/20 0551 12/29/20 1814 12/30/20 0432 12/31/20 0435 01/12/21 1023 01/14/21 1047 02/09/21 1112 04/03/21 1101  NA 139   < > 139   < > 132*   < > 133*  --  136 131* 138  --   --  140  K 4.9   < > 5.2*   < > 4.0   < > 4.3  --  4.2 3.8 5.2 No hemolysis seen* 4.9 4.7 4.8  CL 105   < > 101   < > 100   < > 98  --  101 98 103  --   --  101  CO2 25   < > 30   < > 21*   < > 27  --  27 26 29   --   --  30  GLUCOSE 147*   < > 84   < > 205*   < > 100*  --  111* 100* 89  --   --  92  BUN 21   < > 20   < > 17   < > 8  --  10 7* 17  --   --  22  CREATININE 0.86   < > 0.86   < > 1.00   < > 0.61  --  0.56 0.56 0.76  --   --  0.86  CALCIUM 8.9   < > 9.7   < > 8.9   < > 8.6*  --  8.4* 8.1* 9.6  --   --  10.6*  GFRNONAA >60  --   --    < > 56*   < > >60  --  >60 >60  --   --   --   --   GFRAA >60  --   --   --   --   --   --   --   --   --   --   --   --   --  PROT 6.0*   < > 6.8  --  6.3*  --   --  6.1*  --   --   --   --   --  7.4  ALBUMIN 3.7   < > 4.3  --  2.6*  --   --   --   --   --   --   --   --  4.7  AST 34   < > 17  --  38  --   --   --   --   --   --   --   --  22  ALT 22   < > 14  --  28  --   --   --   --   --   --   --   --  17  ALKPHOS 42   < > 54  --  73  --   --   --   --   --   --   --   --  65  BILITOT 1.0   < > 0.6  --  0.6  --   --   --   --   --   --   --   --  0.5  BILIDIR  --    < > 0.1  --  0.1  --   --   --   --   --   --   --   --  0.1  IBILI  --   --   --   --  0.5  --   --   --   --   --   --   --   --   --    < > = values in this interval not displayed.     MM DIAG BREAST TOMO BILATERAL  Result Date: 05/01/2021 CLINICAL DATA:  87 year old who underwent malignant lumpectomy of the LEFT breast in September, 2021, with adjuvant radiation therapy. Patient currently undergoing hormonal chemoprevention with  letrozole. This is the initial post lumpectomy annual evaluation. EXAM: DIGITAL DIAGNOSTIC BILATERAL MAMMOGRAM WITH TOMOSYNTHESIS AND CAD TECHNIQUE: Bilateral digital diagnostic mammography and breast tomosynthesis was performed. The images were evaluated with computer-aided detection. COMPARISON:  Previous exam(s). ACR Breast Density Category c: The breast tissue is heterogeneously dense, which may obscure small masses. FINDINGS: Full field CC and MLO views of both breasts and a spot magnification CC view of the lumpectomy site in the LEFT breast were obtained. RIGHT: No findings suspicious for malignancy. LEFT: No findings suspicious for malignancy. Post lumpectomy scar/distortion involving the outer breast at anterior to middle depth. Post radiation skin thickening and trabecular thickening diffusely. IMPRESSION: 1. No mammographic evidence of malignancy involving either breast. 2. Expected post lumpectomy changes and post radiation changes involving the LEFT breast. RECOMMENDATION: BILATERAL diagnostic mammography in 1 year. I have discussed the findings and recommendations with the patient. If applicable, a reminder letter will be sent to the patient regarding the next appointment. BI-RADS CATEGORY  2: Benign. Electronically Signed   By: Evangeline Dakin M.D.   On: 05/01/2021 10:50   Hemochromatosis, hereditary ([place]) #Hemochromatosis-July 2022-heterozygous 1 copy of abnormal gene; iron studies-saturation 24% ferritin 77.  #Had a long discussion with the patient regarding the most likely clinically inconsequential nature of patient's diagnosis of hemochromatosis.  I had a long discussion the patient regarding the patho-biology of hemochromatosis- the fact that elevated iron levels over many years could lead to deposition  in the organs leading to-cirrhosis, arthralgias, skin pigmentation, diabetes; central endocrine abnormalities; heart failure etc. However, I'm not overtly concerned about development of  clinical hemochromatosis/iron overload in the patient.  Patient denies any signs and symptoms of cirrhosis; denies any swelling in the legs or abdominal distention.  Patient is essentially asymptomatic.  #Discussed that if saturation greater than 60 to 70%-recommend phlebotomy.  Otherwise continue surveillance.  Given the fairly benign course especially heterozygosity/patient's age-I think it is reasonable for the patient to follow-up with PCP for annual iron panel.  Patient can always be referred back closely for any questions or concerns.  Thank you Dr.Scott for allowing me to participate in the care of your pleasant patient. Please do not hesitate to contact me with questions or concerns in the interim.  DISPOSITION: # follow up as needed-Dr.B      All questions were answered. The patient knows to call the clinic with any problems, questions or concerns.  Cammie Sickle, MD 05/10/2021 11:27 PM

## 2021-04-15 NOTE — Assessment & Plan Note (Addendum)
#  Hemochromatosis-July 2022-heterozygous 1 copy of abnormal gene; iron studies-saturation 24% ferritin 77.  #Had a long discussion with the patient regarding the most likely clinically inconsequential nature of patient's diagnosis of hemochromatosis.  I had a long discussion the patient regarding the patho-biology of hemochromatosis- the fact that elevated iron levels over many years could lead to deposition in the organs leading to-cirrhosis, arthralgias, skin pigmentation, diabetes; central endocrine abnormalities; heart failure etc. However, I'm not overtly concerned about development of clinical hemochromatosis/iron overload in the patient.  Patient denies any signs and symptoms of cirrhosis; denies any swelling in the legs or abdominal distention.  Patient is essentially asymptomatic.  #Discussed that if saturation greater than 60 to 70%-recommend phlebotomy.  Otherwise continue surveillance.  Given the fairly benign course especially heterozygosity/patient's age-I think it is reasonable for the patient to follow-up with PCP for annual iron panel.  Patient can always be referred back closely for any questions or concerns.  Thank you Dr.Scott for allowing me to participate in the care of your pleasant patient. Please do not hesitate to contact me with questions or concerns in the interim.  DISPOSITION: # follow up as needed-Dr.B

## 2021-04-17 ENCOUNTER — Other Ambulatory Visit: Payer: Self-pay | Admitting: Cardiovascular Disease

## 2021-04-17 MED ORDER — PROPRANOLOL HCL 10 MG PO TABS: 10.0000 mg | ORAL_TABLET | Freq: Three times a day (TID) | ORAL | 3 refills | Status: DC | PRN

## 2021-04-17 NOTE — Telephone Encounter (Signed)
*  STAT* If patient is at the pharmacy, call can be transferred to refill team.   1. Which medications need to be refilled? (please list name of each medication and dose if known)   Propranolol 20 mg po TID prn   2. Which pharmacy/location (including street and city if local pharmacy) is medication to be sent to? [facility]   3. Do they need a 30 day or 90 day supply? [place]

## 2021-04-21 ENCOUNTER — Other Ambulatory Visit: Payer: Self-pay | Admitting: Cardiovascular Disease

## 2021-04-29 ENCOUNTER — Other Ambulatory Visit: Payer: Self-pay | Admitting: Cardiovascular Disease

## 2021-04-30 NOTE — Telephone Encounter (Signed)
Prescription refill request for Eliquis received. Indication: Atrial Flutter Last office visit: 03/05/21  Johnny Bridge MD Scr: 0.86 on 04/03/21 Age: 87 Weight: 58.1kg  Based on above findings Eliquis 2.'5mg'$  twice daily is the appropriate dose.  Refill approved.

## 2021-04-30 NOTE — Telephone Encounter (Signed)
Refill request

## 2021-05-01 ENCOUNTER — Ambulatory Visit
Admission: RE | Admit: 2021-05-01 | Discharge: 2021-05-01 | Disposition: A | Discharge status: Home / Self Care | Payer: Medicare Other | Source: Ambulatory Visit | Attending: General Surgery | Admitting: General Surgery

## 2021-05-01 ENCOUNTER — Other Ambulatory Visit: Payer: Self-pay

## 2021-05-01 DIAGNOSIS — Z853 Personal history of malignant neoplasm of breast: Secondary | ICD-10-CM | POA: Insufficient documentation

## 2021-05-01 DIAGNOSIS — Z923 Personal history of irradiation: Secondary | ICD-10-CM | POA: Insufficient documentation

## 2021-05-01 DIAGNOSIS — C50912 Malignant neoplasm of unspecified site of left female breast: Secondary | ICD-10-CM

## 2021-05-28 ENCOUNTER — Other Ambulatory Visit: Payer: Self-pay | Admitting: Cardiovascular Disease

## 2021-05-28 ENCOUNTER — Other Ambulatory Visit: Payer: Self-pay | Admitting: Internal Medicine

## 2021-06-12 ENCOUNTER — Other Ambulatory Visit: Payer: Self-pay

## 2021-06-12 ENCOUNTER — Ambulatory Visit (INDEPENDENT_AMBULATORY_CARE_PROVIDER_SITE_OTHER): Payer: Medicare Other

## 2021-06-12 ENCOUNTER — Encounter: Payer: Self-pay | Admitting: Internal Medicine

## 2021-06-12 ENCOUNTER — Telehealth: Payer: Self-pay | Admitting: Internal Medicine

## 2021-06-12 ENCOUNTER — Ambulatory Visit: Payer: Medicare Other | Admitting: Internal Medicine

## 2021-06-12 VITALS — BP 132/72 | HR 92 | Temp 97.9°F | Resp 16 | Ht 66.0 in | Wt 129.4 lb

## 2021-06-12 DIAGNOSIS — D72819 Decreased white blood cell count, unspecified: Secondary | ICD-10-CM

## 2021-06-12 DIAGNOSIS — R059 Cough, unspecified: Secondary | ICD-10-CM

## 2021-06-12 DIAGNOSIS — I4891 Unspecified atrial fibrillation: Secondary | ICD-10-CM

## 2021-06-12 DIAGNOSIS — E7800 Pure hypercholesterolemia, unspecified: Secondary | ICD-10-CM

## 2021-06-12 DIAGNOSIS — I700 Atherosclerosis of aorta: Secondary | ICD-10-CM

## 2021-06-12 DIAGNOSIS — R739 Hyperglycemia, unspecified: Secondary | ICD-10-CM

## 2021-06-12 DIAGNOSIS — I10 Essential (primary) hypertension: Secondary | ICD-10-CM | POA: Diagnosis not present

## 2021-06-12 DIAGNOSIS — C50919 Malignant neoplasm of unspecified site of unspecified female breast: Secondary | ICD-10-CM

## 2021-06-12 DIAGNOSIS — F439 Reaction to severe stress, unspecified: Secondary | ICD-10-CM

## 2021-06-12 DIAGNOSIS — Z23 Encounter for immunization: Secondary | ICD-10-CM

## 2021-06-12 DIAGNOSIS — D649 Anemia, unspecified: Secondary | ICD-10-CM

## 2021-06-12 MED ORDER — NYSTATIN 100000 UNIT/ML MT SUSP: OROMUCOSAL | 0 refills | Status: DC

## 2021-06-12 NOTE — Telephone Encounter (Signed)
Labs ordered.

## 2021-06-12 NOTE — Telephone Encounter (Signed)
Patient needing orders put in for 08/12/21 lab appointment requested per check out note

## 2021-06-12 NOTE — Assessment & Plan Note (Signed)
Saw Dr Bary Castilla - 05/2021.  Recommended f/u diagnostic bilateral mammogram in one year.

## 2021-06-12 NOTE — Progress Notes (Signed)
Patient ID: Anita Benjamin, female   DOB: [birth year], 87 y.o.   MRN: [id number]   Subjective:    Patient ID: Anita Benjamin, female    DOB: October 08, 1933, 87 y.o.   MRN: [id number]  This visit occurred during the SARS-CoV-2 public health emergency.  Safety protocols were in place, including screening questions prior to the visit, additional usage of staff PPE, and extensive cleaning of exam room while observing appropriate contact time as indicated for disinfecting solutions.   Patient here for f/u appt.   Marland Kitchen   HPI F/u regarding afib, blood pressure and cholesterol.  She reports she is doing relatively well.  No chest pain.  Breathing stable.  Does report had a cold several weeks ago.  Still with scratchy throat.  Wil get a tickle in her throat and then cough related.  No chest congestion.  No sinus pressure.  No sob.  No acid reflux.  Previous nose bleed.  No bleeding over the last several days.  No abdominal pain.  Bowels moving.     Past Medical History:  Diagnosis Date   Anemia    Atrial fibrillation (HCC)    Atrial flutter (HCC)    Fibrocystic breast disease    GERD (gastroesophageal reflux disease)    History of chicken pox    Hypercholesterolemia    Hypertension    Past Surgical History:  Procedure Laterality Date   BREAST BIOPSY Right 2014   NEG   BREAST LUMPECTOMY     BREAST LUMPECTOMY WITH SENTINEL LYMPH NODE BIOPSY Left 06/04/2020   Procedure: BREAST LUMPECTOMY WITH SENTINEL LYMPH NODE BX;  Surgeon: Robert Bellow, MD;  Location: [facility] ORS;  Service: General;  Laterality: Left;   CATARACT EXTRACTION     DILATION AND CURETTAGE OF UTERUS     Laser repair of torn retina     Family History  Problem Relation Age of Onset   Heart disease Mother 61       Heart attack   Cancer Father 63       lung cancer   Breast cancer Paternal Aunt        x3   Colon cancer Neg Hx    Social History   Socioeconomic History   Marital status: Widowed    Spouse name: Not on file   Number of  children: 2   Years of education: Not on file   Highest education level: Not on file  Occupational History   Not on file  Tobacco Use   Smoking status: Never   Smokeless tobacco: Never  Vaping Use   Vaping Use: Never used  Substance and Sexual Activity   Alcohol use: No    Alcohol/week: 0.0 standard drinks   Drug use: No   Sexual activity: Not on file  Other Topics Concern   Not on file  Social History Narrative   Not on file   Social Determinants of Health   Financial Resource Strain: Not on file  Food Insecurity: Not on file  Transportation Needs: Not on file  Physical Activity: Not on file  Stress: Not on file  Social Connections: Not on file    Review of Systems  Constitutional:  Negative for appetite change and unexpected weight change.  HENT:  Negative for congestion and sinus pressure.        Raw, scratchy throat as outlined.    Respiratory:  Negative for cough, chest tightness and shortness of breath.   Cardiovascular:  Negative for chest pain, palpitations and leg  swelling.  Genitourinary:  Negative for difficulty urinating and dysuria.  Neurological:  Negative for dizziness, light-headedness and headaches.  Psychiatric/Behavioral:  Negative for agitation and dysphoric mood.       Objective:     BP 132/72   Pulse 92   Temp 97.9 F (36.6 C)   Resp 16   Ht '5\' 6"'$  (1.676 m)   Wt 129 lb 6.4 oz (58.7 kg)   LMP 10/27/1989   SpO2 98%   BMI 20.89 kg/m  Wt Readings from Last 3 Encounters:  06/12/21 129 lb 6.4 oz (58.7 kg)  04/15/21 128 lb (58.1 kg)  04/03/21 129 lb 3.2 oz (58.6 kg)    Physical Exam Vitals reviewed.  Constitutional:      General: She is not in acute distress.    Appearance: Normal appearance.  HENT:     Head: Normocephalic and atraumatic.     Right Ear: External ear normal.     Left Ear: External ear normal.  Eyes:     General: No scleral icterus.       Right eye: No discharge.        Left eye: No discharge.      Conjunctiva/sclera: Conjunctivae normal.  Neck:     Thyroid: No thyromegaly.  Cardiovascular:     Rate and Rhythm: Normal rate and regular rhythm.  Pulmonary:     Effort: No respiratory distress.     Breath sounds: Normal breath sounds. No wheezing.  Abdominal:     General: Bowel sounds are normal.     Palpations: Abdomen is soft.     Tenderness: There is no abdominal tenderness.  Musculoskeletal:        General: No swelling or tenderness.     Cervical back: Neck supple. No tenderness.  Lymphadenopathy:     Cervical: No cervical adenopathy.  Skin:    Findings: No erythema or rash.  Neurological:     Mental Status: She is alert.  Psychiatric:        Mood and Affect: Mood normal.        Behavior: Behavior normal.     Outpatient Encounter Medications as of 06/12/2021  Medication Sig   nystatin (MYCOSTATIN) 100000 UNIT/ML suspension 5cc's swish and spit tid   acetaminophen (TYLENOL) 650 MG CR tablet Take 650 mg by mouth every 8 (eight) hours as needed for pain.   amLODipine (NORVASC) 5 MG tablet TAKE 1 TABLET BY MOUTH ONCE DAILY AT DINNER TIME   Ascorbic Acid (VITAMIN C) 250 MG CHEW Chew 250 mg by mouth daily.    calcium citrate-vitamin D 500-500 MG-UNIT chewable tablet Chew 3 tablets by mouth daily.   ELIQUIS 2.5 MG TABS tablet TAKE 1 TABLET BY MOUTH TWICE DAILY   famotidine (PEPCID) 20 MG tablet Take 20 mg by mouth daily.   letrozole (FEMARA) 2.5 MG tablet Take 2.5 mg by mouth daily.   losartan (COZAAR) 100 MG tablet TAKE 1 TABLET BY MOUTH ONCE DAILY   Multiple Vitamin (MULTIVITAMIN) tablet Take 1 tablet by mouth daily.   propranolol (INDERAL) 10 MG tablet Take 1 tablet (10 mg total) by mouth 3 (three) times daily as needed (heart rate). Take one tab everyday,  may take extra for breakthrough elevated heart rate   sertraline (ZOLOFT) 50 MG tablet TAKE 1 TABLET BY MOUTH AT BEDTIME   [DISCONTINUED] methylPREDNISolone (MEDROL DOSEPAK) 4 MG TBPK tablet Medrol dosepack 6 day taper.   Take as directed. (Patient not taking: Reported on 04/15/2021)   No facility-administered  encounter medications on file as of 06/12/2021.     Lab Results  Component Value Date   WBC 4.6 04/03/2021   HGB 13.6 04/03/2021   HCT 40.4 04/03/2021   PLT 254.0 04/03/2021   GLUCOSE 92 04/03/2021   CHOL 225 (H) 04/03/2021   TRIG 208.0 (H) 04/03/2021   HDL 67.20 04/03/2021   LDLDIRECT 129.0 04/03/2021   LDLCALC 117 (H) 12/02/2020   ALT 17 04/03/2021   AST 22 04/03/2021   NA 140 04/03/2021   K 4.8 04/03/2021   CL 101 04/03/2021   CREATININE 0.86 04/03/2021   BUN 22 04/03/2021   CO2 30 04/03/2021   TSH 3.08 04/03/2021   HGBA1C 5.7 04/03/2021    MM DIAG BREAST TOMO BILATERAL  Result Date: 05/01/2021 CLINICAL DATA:  87 year old who underwent malignant lumpectomy of the LEFT breast in September, 2021, with adjuvant radiation therapy. Patient currently undergoing hormonal chemoprevention with letrozole. This is the initial post lumpectomy annual evaluation. EXAM: DIGITAL DIAGNOSTIC BILATERAL MAMMOGRAM WITH TOMOSYNTHESIS AND CAD TECHNIQUE: Bilateral digital diagnostic mammography and breast tomosynthesis was performed. The images were evaluated with computer-aided detection. COMPARISON:  Previous exam(s). ACR Breast Density Category c: The breast tissue is heterogeneously dense, which may obscure small masses. FINDINGS: Full field CC and MLO views of both breasts and a spot magnification CC view of the lumpectomy site in the LEFT breast were obtained. RIGHT: No findings suspicious for malignancy. LEFT: No findings suspicious for malignancy. Post lumpectomy scar/distortion involving the outer breast at anterior to middle depth. Post radiation skin thickening and trabecular thickening diffusely. IMPRESSION: 1. No mammographic evidence of malignancy involving either breast. 2. Expected post lumpectomy changes and post radiation changes involving the LEFT breast. RECOMMENDATION: BILATERAL diagnostic  mammography in 1 year. I have discussed the findings and recommendations with the patient. If applicable, a reminder letter will be sent to the patient regarding the next appointment. BI-RADS CATEGORY  2: Benign. Electronically Signed   By: Evangeline Dakin M.D.   On: 05/01/2021 10:50      Assessment & Plan:   Problem List Items Addressed This Visit     Anemia    Follow cbc.       Aortic atherosclerosis (HCC)    Have discussed cholesterol medication.  Follow.        Atrial fibrillation ([place])    Appears to be in SR.  No chest pain.  Stable.  Follow.  Continue eliquis.        Breast cancer (HCC)    Saw Dr Bary Castilla - 05/2021.  Recommended f/u diagnostic bilateral mammogram in one year.       Cough - Primary    Scratchy throat with some associated cough.  Gargle with salt water.  Nystatin suspension as directed.  Check xray.  Follow.        Relevant Orders   DG Chest 2 View   Essential hypertension    Continue amlodipine and losartan.  Blood pressure as outlined.  Follow pressures.  Follow metabolic panel.       Relevant Orders   Basic metabolic panel   Hemochromatosis, hereditary ([place])    Continue to follow iron studies annually.  Saw hematology.        Hypercholesterolemia    Off cholesterol medication.  Follow lipid panel.        Relevant Orders   Hepatic function panel   Lipid panel   Leukopenia    Follow cbc.       Stress  Continue zoloft.  Stable.  Follow.        Other Visit Diagnoses     Need for immunization against influenza       Relevant Orders   Flu Vaccine QUAD High Dose(Fluad) (Completed)   Hyperglycemia       Relevant Orders   Hemoglobin A1c        Einar Pheasant, MD

## 2021-06-13 ENCOUNTER — Encounter: Payer: Self-pay | Admitting: Internal Medicine

## 2021-06-13 NOTE — Assessment & Plan Note (Signed)
Continue amlodipine and losartan.  Blood pressure as outlined.  Follow pressures.  Follow metabolic panel.  

## 2021-06-13 NOTE — Assessment & Plan Note (Signed)
Have discussed cholesterol medication.  Follow.   

## 2021-06-13 NOTE — Assessment & Plan Note (Signed)
Continue zoloft.  Stable.  Follow.   

## 2021-06-13 NOTE — Assessment & Plan Note (Signed)
Off cholesterol medication.  Follow lipid panel.

## 2021-06-13 NOTE — Assessment & Plan Note (Signed)
Continue to follow iron studies annually.  Saw hematology.   

## 2021-06-13 NOTE — Assessment & Plan Note (Signed)
Appears to be in SR.  No chest pain.  Stable.  Follow.  Continue eliquis.

## 2021-06-13 NOTE — Assessment & Plan Note (Signed)
Follow cbc.  

## 2021-06-13 NOTE — Assessment & Plan Note (Signed)
Scratchy throat with some associated cough.  Gargle with salt water.  Nystatin suspension as directed.  Check xray.  Follow.

## 2021-07-16 ENCOUNTER — Other Ambulatory Visit: Payer: Self-pay | Admitting: Internal Medicine

## 2021-08-12 ENCOUNTER — Other Ambulatory Visit (INDEPENDENT_AMBULATORY_CARE_PROVIDER_SITE_OTHER): Payer: Medicare Other

## 2021-08-12 ENCOUNTER — Other Ambulatory Visit: Payer: Self-pay

## 2021-08-12 DIAGNOSIS — E7800 Pure hypercholesterolemia, unspecified: Secondary | ICD-10-CM | POA: Diagnosis not present

## 2021-08-12 DIAGNOSIS — R739 Hyperglycemia, unspecified: Secondary | ICD-10-CM

## 2021-08-12 DIAGNOSIS — I10 Essential (primary) hypertension: Secondary | ICD-10-CM

## 2021-08-12 LAB — BASIC METABOLIC PANEL
BUN: 20 mg/dL (ref 6–23)
CO2: 30 mEq/L (ref 19–32)
Calcium: 9.7 mg/dL (ref 8.4–10.5)
Chloride: 104 mEq/L (ref 96–112)
Creatinine, Ser: 0.94 mg/dL (ref 0.40–1.20)
GFR: 55.36 mL/min — ABNORMAL LOW (ref >60.00)
Glucose, Bld: 103 mg/dL — ABNORMAL HIGH (ref 70–99)
Potassium: 4.5 mEq/L (ref 3.5–5.1)
Sodium: 142 mEq/L (ref 135–145)

## 2021-08-12 LAB — HEPATIC FUNCTION PANEL
ALT: 13 U/L (ref 0–35)
AST: 21 U/L (ref 0–37)
Albumin: 4.5 g/dL (ref 3.5–5.2)
Alkaline Phosphatase: 56 U/L (ref 39–117)
Bilirubin, Direct: 0.1 mg/dL (ref 0.0–0.3)
Total Bilirubin: 0.5 mg/dL (ref 0.2–1.2)
Total Protein: 7.1 g/dL (ref 6.0–8.3)

## 2021-08-12 LAB — LIPID PANEL
Cholesterol: 211 mg/dL — ABNORMAL HIGH (ref 0–200)
HDL: 63.2 mg/dL (ref >39.00)
LDL Cholesterol: 116 mg/dL — ABNORMAL HIGH (ref 0–99)
NonHDL: 147.94
Total CHOL/HDL Ratio: 3
Triglycerides: 158 mg/dL — ABNORMAL HIGH (ref 0.0–149.0)
VLDL: 31.6 mg/dL (ref 0.0–40.0)

## 2021-08-12 LAB — HEMOGLOBIN A1C: Hgb A1c MFr Bld: 5.7 % (ref 4.6–6.5)

## 2021-08-21 ENCOUNTER — Ambulatory Visit
Admission: RE | Admit: 2021-08-21 | Discharge: 2021-08-21 | Disposition: A | Discharge status: Home / Self Care | Payer: Medicare Other | Source: Ambulatory Visit | Attending: Radiation Oncology | Admitting: Radiation Oncology

## 2021-08-21 ENCOUNTER — Other Ambulatory Visit: Payer: Self-pay

## 2021-08-21 ENCOUNTER — Encounter: Payer: Self-pay | Admitting: Radiation Oncology

## 2021-08-21 VITALS — BP 149/80 | HR 58 | Temp 96.8°F | Resp 16 | Wt 131.4 lb

## 2021-08-21 DIAGNOSIS — Z923 Personal history of irradiation: Secondary | ICD-10-CM | POA: Insufficient documentation

## 2021-08-21 DIAGNOSIS — Z79811 Long term (current) use of aromatase inhibitors: Secondary | ICD-10-CM | POA: Insufficient documentation

## 2021-08-21 DIAGNOSIS — Z170 Estrogen receptor positive status [ER+]: Secondary | ICD-10-CM | POA: Insufficient documentation

## 2021-08-21 DIAGNOSIS — C50412 Malignant neoplasm of upper-outer quadrant of left female breast: Secondary | ICD-10-CM | POA: Insufficient documentation

## 2021-08-21 NOTE — Progress Notes (Signed)
Radiation Oncology Follow up Note  Name: Anita Benjamin   Date:   08/21/2021 MRN:  [id number] DOB: 08-Jan-1934    This 87 y.o. female presents to the clinic today for 1 year follow-up status post whole breast radiation to her left breast for stage Ia (T1b N0 M0) invasive mammary carcinoma ER positive.  REFERRING PROVIDER: Einar Pheasant, MD  HPI: Patient is a 87 year old female now at 1 year having completed whole breast radiation to her left breast for stage Ia ER positive invasive mammary carcinoma.  Seen today in routine follow-up she is doing well.  She specifically denies breast tenderness cough or bone pain..  Mammograms performed in July were BI-RADS 2 benign which I have reviewed.She is currently on letrozole tolerating that well without side effect.  COMPLICATIONS OF TREATMENT: none  FOLLOW UP COMPLIANCE: keeps appointments   PHYSICAL EXAM:  BP (!) 149/80 (BP Location: Left Arm, Patient Position: Sitting)   Pulse (!) 58   Temp (!) 96.8 F (36 C) (Tympanic)   Resp 16   Wt 131 lb 6.4 oz (59.6 kg)   LMP 10/27/1989   BMI 21.21 kg/m  Lungs are clear to A&P cardiac examination essentially unremarkable with regular rate and rhythm. No dominant mass or nodularity is noted in either breast in 2 positions examined. Incision is well-healed. No axillary or supraclavicular adenopathy is appreciated. Cosmetic result is excellent.  Well-developed well-nourished patient in NAD. HEENT reveals PERLA, EOMI, discs not visualized.  Oral cavity is clear. No oral mucosal lesions are identified. Neck is clear without evidence of cervical or supraclavicular adenopathy. Lungs are clear to A&P. Cardiac examination is essentially unremarkable with regular rate and rhythm without murmur rub or thrill. Abdomen is benign with no organomegaly or masses noted. Motor sensory and DTR levels are equal and symmetric in the upper and lower extremities. Cranial nerves II through XII are grossly intact. Proprioception is  intact. No peripheral adenopathy or edema is identified. No motor or sensory levels are noted. Crude visual fields are within normal range.  RADIOLOGY RESULTS: Mammograms reviewed compatible with above-stated findings  PLAN: Present time she is 1 year out with no evidence of disease.  And pleased with her overall progress.  I have asked to see her back in 1 year for follow-up.  She is already scheduled for follow-up mammograms.  Patient continues on letrozole without side effect.  Patient knows to call with any concerns.  I would like to take this opportunity to thank you for allowing me to participate in the care of your patient.Noreene Filbert, MD

## 2021-08-28 ENCOUNTER — Other Ambulatory Visit: Payer: Self-pay | Admitting: Internal Medicine

## 2021-09-11 ENCOUNTER — Ambulatory Visit: Payer: Medicare Other | Admitting: Internal Medicine

## 2021-09-11 ENCOUNTER — Encounter: Payer: Self-pay | Admitting: Internal Medicine

## 2021-09-11 ENCOUNTER — Other Ambulatory Visit: Payer: Self-pay

## 2021-09-11 DIAGNOSIS — C50919 Malignant neoplasm of unspecified site of unspecified female breast: Secondary | ICD-10-CM

## 2021-09-11 DIAGNOSIS — D72819 Decreased white blood cell count, unspecified: Secondary | ICD-10-CM

## 2021-09-11 DIAGNOSIS — R059 Cough, unspecified: Secondary | ICD-10-CM

## 2021-09-11 DIAGNOSIS — I4891 Unspecified atrial fibrillation: Secondary | ICD-10-CM

## 2021-09-11 DIAGNOSIS — I10 Essential (primary) hypertension: Secondary | ICD-10-CM | POA: Diagnosis not present

## 2021-09-11 DIAGNOSIS — R944 Abnormal results of kidney function studies: Secondary | ICD-10-CM

## 2021-09-11 DIAGNOSIS — F439 Reaction to severe stress, unspecified: Secondary | ICD-10-CM

## 2021-09-11 DIAGNOSIS — D6859 Other primary thrombophilia: Secondary | ICD-10-CM

## 2021-09-11 DIAGNOSIS — I700 Atherosclerosis of aorta: Secondary | ICD-10-CM

## 2021-09-11 DIAGNOSIS — E7800 Pure hypercholesterolemia, unspecified: Secondary | ICD-10-CM

## 2021-09-11 LAB — BASIC METABOLIC PANEL
BUN: 22 mg/dL (ref 6–23)
CO2: 30 mEq/L (ref 19–32)
Calcium: 10.3 mg/dL (ref 8.4–10.5)
Chloride: 102 mEq/L (ref 96–112)
Creatinine, Ser: 0.89 mg/dL (ref 0.40–1.20)
GFR: 59.08 mL/min — ABNORMAL LOW (ref >60.00)
Glucose, Bld: 86 mg/dL (ref 70–99)
Potassium: 4.6 mEq/L (ref 3.5–5.1)
Sodium: 139 mEq/L (ref 135–145)

## 2021-09-11 NOTE — Progress Notes (Signed)
Patient ID: Anita Benjamin, female   DOB: 28-Mar-1934, 87 y.o.   MRN: [id number]   Subjective:    Patient ID: Anita Benjamin, female    DOB: 1933-12-01, 87 y.o.   MRN: [id number]  This visit occurred during the SARS-CoV-2 public health emergency.  Safety protocols were in place, including screening questions prior to the visit, additional usage of staff PPE, and extensive cleaning of exam room while observing appropriate contact time as indicated for disinfecting solutions.   Patient here for  Chief Complaint  Patient presents with   Follow-up   .   HPI Here to follow up regarding her blood pressure, afib and persistent cough.  Feels from a cardiac standpoint - stable.  No significant increased heart rate or palpitations.  No increased chest pain reported.  Breathing stable.  Eating.  No nausea or vomiting.  No abdominal pain.  Bowels moving.  Still persistent cough.  Worse in am.  Clears mucus - in her throat (in am).  No increased acid reflux.  No sinus/allergy symptoms.  Did have increased coughing recently - was drinking/talking and started coughing.  Has been a persistent issue as outlined - noted more in am.    Past Medical History:  Diagnosis Date   Anemia    Atrial fibrillation (HCC)    Atrial flutter (HCC)    Fibrocystic breast disease    GERD (gastroesophageal reflux disease)    History of chicken pox    Hypercholesterolemia    Hypertension    Past Surgical History:  Procedure Laterality Date   BREAST BIOPSY Right 2014   NEG   BREAST LUMPECTOMY     BREAST LUMPECTOMY WITH SENTINEL LYMPH NODE BIOPSY Left 06/04/2020   Procedure: BREAST LUMPECTOMY WITH SENTINEL LYMPH NODE BX;  Surgeon: Robert Bellow, MD;  Location: [facility] ORS;  Service: General;  Laterality: Left;   CATARACT EXTRACTION     DILATION AND CURETTAGE OF UTERUS     Laser repair of torn retina     Family History  Problem Relation Age of Onset   Heart disease Mother 73       Heart attack   Cancer Father 65        lung cancer   Breast cancer Paternal Aunt        x3   Colon cancer Neg Hx    Social History   Socioeconomic History   Marital status: Widowed    Spouse name: Not on file   Number of children: 2   Years of education: Not on file   Highest education level: Not on file  Occupational History   Not on file  Tobacco Use   Smoking status: Never   Smokeless tobacco: Never  Vaping Use   Vaping Use: Never used  Substance and Sexual Activity   Alcohol use: No    Alcohol/week: 0.0 standard drinks   Drug use: No   Sexual activity: Not on file  Other Topics Concern   Not on file  Social History Narrative   Not on file   Social Determinants of Health   Financial Resource Strain: Not on file  Food Insecurity: Not on file  Transportation Needs: Not on file  Physical Activity: Not on file  Stress: Not on file  Social Connections: Not on file     Review of Systems  Constitutional:  Negative for appetite change and unexpected weight change.  HENT:  Negative for congestion and sinus pressure.   Respiratory:  Positive for cough. Negative for  chest tightness and shortness of breath.   Cardiovascular:  Negative for chest pain and leg swelling.       No increased heart rate or palpitations.   Gastrointestinal:  Negative for abdominal pain, diarrhea, nausea and vomiting.  Genitourinary:  Negative for difficulty urinating and dysuria.  Musculoskeletal:  Negative for joint swelling and myalgias.  Skin:  Negative for color change and rash.  Neurological:  Negative for dizziness, light-headedness and headaches.  Psychiatric/Behavioral:  Negative for agitation and dysphoric mood.       Objective:     BP 138/60   Pulse (!) 50   Temp 98.2 F (36.8 C) (Oral)   Resp 16   Ht 5\' 6"  (1.676 m)   Wt 131 lb (59.4 kg)   LMP 10/27/1989   SpO2 95%   BMI 21.14 kg/m  Wt Readings from Last 3 Encounters:  09/11/21 131 lb (59.4 kg)  08/21/21 131 lb 6.4 oz (59.6 kg)  06/12/21 129 lb 6.4 oz  (58.7 kg)    Physical Exam Vitals reviewed.  Constitutional:      General: She is not in acute distress.    Appearance: Normal appearance.  HENT:     Head: Normocephalic and atraumatic.     Right Ear: External ear normal.     Left Ear: External ear normal.  Eyes:     General: No scleral icterus.       Right eye: No discharge.        Left eye: No discharge.     Conjunctiva/sclera: Conjunctivae normal.  Neck:     Thyroid: No thyromegaly.  Cardiovascular:     Rate and Rhythm: Normal rate and regular rhythm.  Pulmonary:     Effort: No respiratory distress.     Breath sounds: Normal breath sounds. No wheezing.  Abdominal:     General: Bowel sounds are normal.     Palpations: Abdomen is soft.     Tenderness: There is no abdominal tenderness.  Musculoskeletal:        General: No swelling or tenderness.     Cervical back: Neck supple. No tenderness.  Lymphadenopathy:     Cervical: No cervical adenopathy.  Skin:    Findings: No erythema or rash.  Neurological:     Mental Status: She is alert.  Psychiatric:        Mood and Affect: Mood normal.        Behavior: Behavior normal.     Outpatient Encounter Medications as of 09/11/2021  Medication Sig   acetaminophen (TYLENOL) 650 MG CR tablet Take 650 mg by mouth every 8 (eight) hours as needed for pain.   amLODipine (NORVASC) 5 MG tablet TAKE 1 TABLET BY MOUTH ONCE DAILY AT DINNER TIME   Ascorbic Acid (VITAMIN C) 250 MG CHEW Chew 250 mg by mouth daily.    calcium citrate-vitamin D 500-500 MG-UNIT chewable tablet Chew 3 tablets by mouth daily.   ELIQUIS 2.5 MG TABS tablet TAKE 1 TABLET BY MOUTH TWICE DAILY   famotidine (PEPCID) 20 MG tablet Take 20 mg by mouth daily.   letrozole (FEMARA) 2.5 MG tablet Take 2.5 mg by mouth daily.   losartan (COZAAR) 100 MG tablet TAKE 1 TABLET BY MOUTH ONCE DAILY   Multiple Vitamin (MULTIVITAMIN) tablet Take 1 tablet by mouth daily.   nystatin (MYCOSTATIN) 100000 UNIT/ML suspension 5cc's swish  and spit tid   propranolol (INDERAL) 10 MG tablet Take 1 tablet (10 mg total) by mouth 3 (three) times daily as needed (  heart rate). Take one tab everyday,  may take extra for breakthrough elevated heart rate   sertraline (ZOLOFT) 50 MG tablet TAKE 1 TABLET BY MOUTH AT BEDTIME   No facility-administered encounter medications on file as of 09/11/2021.     Lab Results  Component Value Date   WBC 4.6 04/03/2021   HGB 13.6 04/03/2021   HCT 40.4 04/03/2021   PLT 254.0 04/03/2021   GLUCOSE 86 09/11/2021   CHOL 211 (H) 08/12/2021   TRIG 158.0 (H) 08/12/2021   HDL 63.20 08/12/2021   LDLDIRECT 129.0 04/03/2021   LDLCALC 116 (H) 08/12/2021   ALT 13 08/12/2021   AST 21 08/12/2021   NA 139 09/11/2021   K 4.6 09/11/2021   CL 102 09/11/2021   CREATININE 0.89 09/11/2021   BUN 22 09/11/2021   CO2 30 09/11/2021   TSH 3.08 04/03/2021   HGBA1C 5.7 08/12/2021       Assessment & Plan:   Problem List Items Addressed This Visit     Aortic atherosclerosis ([place])    Have discussed cholesterol medication.  Follow.        Atrial fibrillation ([place])    Appears to be in SR.  No chest pain.  Stable.  Follow.  Continue eliquis.        Breast cancer (HCC)    Saw Dr Bary Castilla - 05/2021.  Recommended f/u diagnostic bilateral mammogram in one year.       Cough    Persistent cough. CXR with no active cardiopulmonary disease.  Lungs clear.  No acid reflux.  Pepcid.  No sinus pressure and increased allergy symptoms.  Given persistent cough, will have pulmonary evaluate.       Relevant Orders   Ambulatory referral to Pulmonology   Decreased GFR    GFR slightly decreased on recent labs.  Recheck today to confirm stable/improved.  Avoid antiinflammatories.        Relevant Orders   Basic metabolic panel (Completed)   Essential hypertension    Continue amlodipine and losartan.  Blood pressure as outlined.  Follow pressures.  Follow metabolic panel.       Hemochromatosis, hereditary ([place])    Continue  to follow iron studies annually.  Saw hematology.        Hypercholesterolemia    Off cholesterol medication.  Follow lipid panel.        Leukopenia    Follow cbc.       Stress    Continue zoloft.  Stable.  Follow.        Thrombophilia ([place])    afib on eliquis.         Einar Pheasant, MD

## 2021-09-12 ENCOUNTER — Encounter: Payer: Self-pay | Admitting: Internal Medicine

## 2021-09-12 DIAGNOSIS — D6859 Other primary thrombophilia: Secondary | ICD-10-CM | POA: Insufficient documentation

## 2021-09-12 NOTE — Assessment & Plan Note (Signed)
afib on eliquis.  

## 2021-09-12 NOTE — Assessment & Plan Note (Signed)
Continue amlodipine and losartan.  Blood pressure as outlined.  Follow pressures.  Follow metabolic panel.  

## 2021-09-12 NOTE — Assessment & Plan Note (Addendum)
Persistent cough. CXR with no active cardiopulmonary disease.  Lungs clear.  No acid reflux.  Pepcid.  No sinus pressure and increased allergy symptoms.  Given persistent cough, will have pulmonary evaluate.

## 2021-09-12 NOTE — Assessment & Plan Note (Signed)
Continue to follow iron studies annually.  Saw hematology.   

## 2021-09-12 NOTE — Assessment & Plan Note (Signed)
Continue zoloft.  Stable.  Follow.   

## 2021-09-12 NOTE — Assessment & Plan Note (Signed)
GFR slightly decreased on recent labs.  Recheck today to confirm stable/improved.  Avoid antiinflammatories.

## 2021-09-12 NOTE — Assessment & Plan Note (Signed)
Off cholesterol medication.  Follow lipid panel.

## 2021-09-12 NOTE — Assessment & Plan Note (Signed)
Saw Dr Bary Castilla - 05/2021.  Recommended f/u diagnostic bilateral mammogram in one year.

## 2021-09-12 NOTE — Assessment & Plan Note (Signed)
Appears to be in SR.  No chest pain.  Stable.  Follow.  Continue eliquis.

## 2021-09-12 NOTE — Assessment & Plan Note (Signed)
Follow cbc.  

## 2021-09-12 NOTE — Assessment & Plan Note (Signed)
Have discussed cholesterol medication.  Follow.   

## 2021-10-14 ENCOUNTER — Other Ambulatory Visit: Payer: Self-pay | Admitting: Internal Medicine

## 2021-10-26 ENCOUNTER — Ambulatory Visit (INDEPENDENT_AMBULATORY_CARE_PROVIDER_SITE_OTHER): Payer: Medicare Other | Admitting: Pulmonary Disease

## 2021-10-26 ENCOUNTER — Other Ambulatory Visit: Payer: Self-pay

## 2021-10-26 ENCOUNTER — Encounter: Payer: Self-pay | Admitting: Pulmonary Disease

## 2021-10-26 VITALS — BP 118/66 | HR 59 | Temp 97.5°F | Ht 66.5 in | Wt 131.8 lb

## 2021-10-26 DIAGNOSIS — J209 Acute bronchitis, unspecified: Secondary | ICD-10-CM

## 2021-10-26 DIAGNOSIS — R053 Chronic cough: Secondary | ICD-10-CM

## 2021-10-26 DIAGNOSIS — R0602 Shortness of breath: Secondary | ICD-10-CM

## 2021-10-26 MED ORDER — AZITHROMYCIN 250 MG PO TABS: ORAL_TABLET | ORAL | 0 refills | Status: AC

## 2021-10-26 MED ORDER — BENZONATATE 100 MG PO CAPS: 100.0000 mg | ORAL_CAPSULE | Freq: Three times a day (TID) | ORAL | 0 refills | Status: DC | PRN

## 2021-10-26 MED ORDER — METHYLPREDNISOLONE 4 MG PO TBPK: ORAL_TABLET | ORAL | 0 refills | Status: DC

## 2021-10-26 NOTE — Patient Instructions (Signed)
I have sent some prescriptions to your pharmacy with for an antibiotic and an anti-inflammatory.  Also sent a prescription for a cough medicine.  We are going to get some breathing tests scheduled.   We will see him in follow-up in 4 to 6 weeks time call sooner should any new problems arise.

## 2021-10-26 NOTE — Progress Notes (Signed)
Subjective:    Patient ID: Anita Benjamin, female    DOB: 12-Apr-1934, 88 y.o.   MRN: [id number] Chief Complaint  Patient presents with   Consult    HPI Patient is an 88 year old long never smoker who presents for evaluation of a cough of proximately 1 years duration.  She is kindly referred by Dr. Einar Pheasant.  Has noted that she has had a dry, "tickle in her throat "type of cough that she believes has been present for approximately a year.  This is not associated with any fevers, chills or sweats.  It is worsened by talking sometimes related to eating and drinking but cold water can improve the symptom.  She also notes that eating hard candy will also help.  She has not had any chest pain.  Occasional shortness of breath.  No orthopnea or paroxysmal nocturnal dyspnea.  In the last week or 2 however she has noted some increase nasal congestion and sinus pressure.  Cough has become productive over the last several days with greenish sputum.  Has not taking her temperature so does not know if she has had a fever and she has noted some malaise.  This is different from her baseline cough.  She does not endorse any gastroesophageal reflux symptoms.  She does not have any lower extremity edema or calf tenderness.  No nausea or vomiting.  Does not describe any gastroesophageal reflux symptoms or dysphagia.  Interestingly, she was diagnosed with breast cancer, invasive mammary carcinoma ER positive, in September 2021 and underwent lumpectomy with follow-up radiation therapy.  She has been on letrozole (Femara) for this.  Chest x-ray performed in September showed no acute disease.  She has not had any she lives with her daughter.  She is retired with no significant occupational exposures in the past.  No recent exotic travel.  She does not endorse any other symptomatology.   Review of Systems A 10 point review of systems was performed and it is as noted above otherwise negative.  Past Medical History:   Diagnosis Date   Anemia    Atrial fibrillation (HCC)    Atrial flutter (HCC)    Fibrocystic breast disease    GERD (gastroesophageal reflux disease)    History of chicken pox    Hypercholesterolemia    Hypertension    Past Surgical History:  Procedure Laterality Date   BREAST BIOPSY Right 2014   NEG   BREAST LUMPECTOMY     BREAST LUMPECTOMY WITH SENTINEL LYMPH NODE BIOPSY Left 06/04/2020   Procedure: BREAST LUMPECTOMY WITH SENTINEL LYMPH NODE BX;  Surgeon: Robert Bellow, MD;  Location: [facility] ORS;  Service: General;  Laterality: Left;   CATARACT EXTRACTION     DILATION AND CURETTAGE OF UTERUS     Laser repair of torn retina     Family History  Problem Relation Age of Onset   Heart disease Mother 66       Heart attack   Cancer Father 88       lung cancer   Breast cancer Paternal Aunt        x3   Colon cancer Neg Hx    Social History   Tobacco Use   Smoking status: Never   Smokeless tobacco: Never  Substance Use Topics   Alcohol use: No    Alcohol/week: 0.0 standard drinks   Allergies  Allergen Reactions   Oruvail [Ketoprofen]    Relafen [Nabumetone]    Timentin [Ticarcillin-Pot Clavulanate]    Current Meds  Medication Sig   acetaminophen (TYLENOL) 650 MG CR tablet Take 650 mg by mouth every 8 (eight) hours as needed for pain.   amLODipine (NORVASC) 5 MG tablet TAKE 1 TABLET BY MOUTH ONCE DAILY AT DINNER TIME   Ascorbic Acid (VITAMIN C) 250 MG CHEW Chew 250 mg by mouth daily.    calcium citrate-vitamin D 500-500 MG-UNIT chewable tablet Chew 3 tablets by mouth daily.   ELIQUIS 2.5 MG TABS tablet TAKE 1 TABLET BY MOUTH TWICE DAILY   famotidine (PEPCID) 20 MG tablet Take 20 mg by mouth daily.   letrozole (FEMARA) 2.5 MG tablet Take 2.5 mg by mouth daily.   losartan (COZAAR) 100 MG tablet TAKE 1 TABLET BY MOUTH ONCE DAILY   Multiple Vitamin (MULTIVITAMIN) tablet Take 1 tablet by mouth daily.   propranolol (INDERAL) 10 MG tablet Take 1 tablet (10 mg total) by  mouth 3 (three) times daily as needed (heart rate). Take one tab everyday,  may take extra for breakthrough elevated heart rate   sertraline (ZOLOFT) 50 MG tablet TAKE 1 TABLET BY MOUTH AT BEDTIME   [DISCONTINUED] nystatin (MYCOSTATIN) 100000 UNIT/ML suspension 5cc's swish and spit tid   Immunization History  Administered Date(s) Administered   Fluad Quad(high Dose 65+) 06/22/2019, 07/30/2020, 06/12/2021   Influenza, High Dose Seasonal PF 06/10/2017, 09/14/2018   Influenza,inj,Quad PF,6+ Mos 06/27/2013, 07/04/2014, 06/05/2015, 05/27/2016   PFIZER(Purple Top)SARS-COV-2 Vaccination 10/31/2019, 11/21/2019   Pneumococcal Conjugate-13 10/21/2015   Pneumococcal Polysaccharide-23 10/06/2017   Tdap 10/27/2012       Objective:   Physical Exam BP 118/66 (BP Location: Left Arm, Patient Position: Sitting, Cuff Size: Normal)    Pulse (!) 59    Temp (!) 97.5 F (36.4 C) (Oral)    Ht 5' 6.5" (1.689 m)    Wt 131 lb 12.8 oz (59.8 kg)    LMP 10/27/1989    SpO2 99%    BMI 20.95 kg/m  GENERAL: Thin woman, no acute distress, does sound nasally congested, fully ambulatory.  No conversational dyspnea. HEAD: Normocephalic, atraumatic.  EYES: Pupils equal, round, reactive to light.  No scleral icterus.  MOUTH: Nose/mouth/throat not examined due to masking requirements for COVID 19. NECK: Supple. No thyromegaly. Trachea midline. No JVD.  No adenopathy. PULMONARY: Good air entry bilaterally.  No adventitious sounds. CARDIOVASCULAR: S1 and S2. Regular rate and rhythm.  No rubs, murmurs or gallops heard. ABDOMEN: Benign. MUSCULOSKELETAL: No joint deformity, no clubbing, no edema.  NEUROLOGIC: No focal deficit, no gait disturbance, speech is fluent. SKIN: Intact,warm,dry. PSYCH: Mood and behavior normal.  Chest x-ray performed 15 June 2021:     Assessment & Plan:     ICD-10-CM   1. Chronic cough  R05.3    Onset of dry cough approximately a year Coincides with initiation of letrozole for breast  cancer  Will evaluate other potential causes of cough    2. SOB (shortness of breath)  R06.02 Pulmonary Function Test [facility] Only   Will obtain PFTs    3. Acute bronchitis, unspecified organism  J20.9    Medrol Dosepak Azithromycin Z-Pak      Orders Placed This Encounter  Procedures   Pulmonary Function Test [facility] Only    Standing Status:   Future    Standing Expiration Date:   10/26/2022    Order Specific Question:   Full PFT: includes the following: basic spirometry, spirometry pre & post bronchodilator, diffusion capacity (DLCO), lung volumes    Answer:   Full PFT    Order Specific  Question:   This test can only be performed at    Answer:   [place] ordered this encounter  Medications   azithromycin (ZITHROMAX) 250 MG tablet    Sig: Take 2 tablets (500 mg) on  Day 1,  followed by 1 tablet (250 mg) once daily on Days 2 through 5.    Dispense:  6 each    Refill:  0   methylPREDNISolone (MEDROL DOSEPAK) 4 MG TBPK tablet    Sig: Take as directed in the package    Dispense:  21 tablet    Refill:  0   benzonatate (TESSALON) 100 MG capsule    Sig: Take 1 capsule (100 mg total) by mouth 3 (three) times daily as needed for cough.    Dispense:  20 capsule    Refill:  0   Patient has a chronic component of cough recently exacerbated by episode of tracheobronchitis.  Suspect her chronic component is due to use of letrozole for breast cancer therapy.  Await testing as above.  We will see her in follow-up in 4 to 6 weeks time call sooner should any new problems arise.   Renold Don, MD Advanced Bronch[facility]    *This note was dictated using voice recognition software/Dragon.  Despite best efforts to proofread, errors can occur which can change the meaning. Any transcriptional errors that result from this process are unintentional and may not be fully corrected at the time of dictation.

## 2021-10-27 ENCOUNTER — Encounter: Payer: Self-pay | Admitting: Pulmonary Disease

## 2021-10-30 ENCOUNTER — Other Ambulatory Visit: Payer: Self-pay | Admitting: Cardiovascular Disease

## 2021-10-30 NOTE — Telephone Encounter (Signed)
Refill request

## 2021-10-30 NOTE — Telephone Encounter (Signed)
Prescription refill request for Eliquis received. Indication: Aflutter Last office visit: 03/05/21 [place] Situ)  Scr: 0.89 (09/11/21)  Age: 88 Weight: 59.8kg  Appropriate dose and refill sent to requested pharmacy.

## 2021-11-17 ENCOUNTER — Telehealth: Payer: Self-pay

## 2021-11-17 NOTE — Telephone Encounter (Signed)
Patient's daughter, Sharon(DPR) is aware of date/time of covid test and voiced her understanding.  Nothing further needed.

## 2021-11-19 ENCOUNTER — Other Ambulatory Visit: Payer: Self-pay

## 2021-11-19 ENCOUNTER — Other Ambulatory Visit
Admission: RE | Admit: 2021-11-19 | Discharge: 2021-11-19 | Disposition: A | Discharge status: Home / Self Care | Payer: Medicare Other | Source: Ambulatory Visit | Attending: Pulmonary Disease | Admitting: Pulmonary Disease

## 2021-11-19 DIAGNOSIS — Z20822 Contact with and (suspected) exposure to covid-19: Secondary | ICD-10-CM | POA: Diagnosis not present

## 2021-11-19 DIAGNOSIS — Z01812 Encounter for preprocedural laboratory examination: Secondary | ICD-10-CM | POA: Insufficient documentation

## 2021-11-19 LAB — SARS CORONAVIRUS 2 (TAT 6-24 HRS): SARS Coronavirus 2: NEGATIVE

## 2021-11-20 ENCOUNTER — Ambulatory Visit: Payer: Medicare Other | Attending: Pulmonary Disease

## 2021-11-20 DIAGNOSIS — R0602 Shortness of breath: Secondary | ICD-10-CM | POA: Insufficient documentation

## 2021-11-20 DIAGNOSIS — R0600 Dyspnea, unspecified: Secondary | ICD-10-CM | POA: Diagnosis not present

## 2021-11-20 DIAGNOSIS — R059 Cough, unspecified: Secondary | ICD-10-CM | POA: Insufficient documentation

## 2021-11-20 MED ORDER — ALBUTEROL SULFATE (2.5 MG/3ML) 0.083% IN NEBU
2.5000 mg | INHALATION_SOLUTION | Freq: Once | RESPIRATORY_TRACT | Status: AC
  Administered 2021-11-20: 2.5 mg via RESPIRATORY_TRACT
  Filled 2021-11-20: qty 3

## 2021-12-08 ENCOUNTER — Ambulatory Visit: Payer: Medicare Other | Admitting: Pulmonary Disease

## 2021-12-15 ENCOUNTER — Other Ambulatory Visit: Payer: Self-pay

## 2021-12-15 ENCOUNTER — Ambulatory Visit (INDEPENDENT_AMBULATORY_CARE_PROVIDER_SITE_OTHER): Payer: Medicare Other

## 2021-12-15 ENCOUNTER — Ambulatory Visit: Payer: Medicare Other | Admitting: Internal Medicine

## 2021-12-15 VITALS — BP 128/70 | HR 70 | Temp 98.0°F | Ht 66.0 in | Wt 132.6 lb

## 2021-12-15 DIAGNOSIS — I10 Essential (primary) hypertension: Secondary | ICD-10-CM

## 2021-12-15 DIAGNOSIS — E7800 Pure hypercholesterolemia, unspecified: Secondary | ICD-10-CM

## 2021-12-15 DIAGNOSIS — D72819 Decreased white blood cell count, unspecified: Secondary | ICD-10-CM

## 2021-12-15 DIAGNOSIS — I4891 Unspecified atrial fibrillation: Secondary | ICD-10-CM

## 2021-12-15 DIAGNOSIS — M79671 Pain in right foot: Secondary | ICD-10-CM | POA: Diagnosis not present

## 2021-12-15 DIAGNOSIS — M7989 Other specified soft tissue disorders: Secondary | ICD-10-CM

## 2021-12-15 DIAGNOSIS — R55 Syncope and collapse: Secondary | ICD-10-CM

## 2021-12-15 DIAGNOSIS — M25571 Pain in right ankle and joints of right foot: Secondary | ICD-10-CM | POA: Diagnosis not present

## 2021-12-15 DIAGNOSIS — I700 Atherosclerosis of aorta: Secondary | ICD-10-CM

## 2021-12-15 DIAGNOSIS — D6859 Other primary thrombophilia: Secondary | ICD-10-CM | POA: Diagnosis not present

## 2021-12-15 DIAGNOSIS — R739 Hyperglycemia, unspecified: Secondary | ICD-10-CM | POA: Diagnosis not present

## 2021-12-15 DIAGNOSIS — C50919 Malignant neoplasm of unspecified site of unspecified female breast: Secondary | ICD-10-CM

## 2021-12-15 DIAGNOSIS — M25561 Pain in right knee: Secondary | ICD-10-CM | POA: Insufficient documentation

## 2021-12-15 DIAGNOSIS — E875 Hyperkalemia: Secondary | ICD-10-CM

## 2021-12-15 LAB — CBC WITH DIFFERENTIAL/PLATELET
Basophils Absolute: 0.1 10*3/uL (ref 0.0–0.1)
Basophils Relative: 0.9 % (ref 0.0–3.0)
Eosinophils Absolute: 0.2 10*3/uL (ref 0.0–0.7)
Eosinophils Relative: 3.2 % (ref 0.0–5.0)
HCT: 38.2 % (ref 36.0–46.0)
Hemoglobin: 12.9 g/dL (ref 12.0–15.0)
Lymphocytes Relative: 16.6 % (ref 12.0–46.0)
Lymphs Abs: 1 10*3/uL (ref 0.7–4.0)
MCHC: 33.7 g/dL (ref 30.0–36.0)
MCV: 95.1 fl (ref 78.0–100.0)
Monocytes Absolute: 0.6 10*3/uL (ref 0.1–1.0)
Monocytes Relative: 9.6 % (ref 3.0–12.0)
Neutro Abs: 4.2 10*3/uL (ref 1.4–7.7)
Neutrophils Relative %: 69.7 % (ref 43.0–77.0)
Platelets: 343 10*3/uL (ref 150.0–400.0)
RBC: 4.02 Mil/uL (ref 3.87–5.11)
RDW: 13.6 % (ref 11.5–15.5)
WBC: 6 10*3/uL (ref 4.0–10.5)

## 2021-12-15 LAB — BASIC METABOLIC PANEL
BUN: 18 mg/dL (ref 6–23)
CO2: 30 mEq/L (ref 19–32)
Calcium: 10.8 mg/dL — ABNORMAL HIGH (ref 8.4–10.5)
Chloride: 101 mEq/L (ref 96–112)
Creatinine, Ser: 0.94 mg/dL (ref 0.40–1.20)
GFR: 55.23 mL/min — ABNORMAL LOW (ref >60.00)
Glucose, Bld: 100 mg/dL — ABNORMAL HIGH (ref 70–99)
Potassium: 4.9 mEq/L (ref 3.5–5.1)
Sodium: 138 mEq/L (ref 135–145)

## 2021-12-15 LAB — LIPID PANEL
Cholesterol: 185 mg/dL (ref 0–200)
HDL: 58.4 mg/dL (ref >39.00)
LDL Cholesterol: 94 mg/dL (ref 0–99)
NonHDL: 127.01
Total CHOL/HDL Ratio: 3
Triglycerides: 164 mg/dL — ABNORMAL HIGH (ref 0.0–149.0)
VLDL: 32.8 mg/dL (ref 0.0–40.0)

## 2021-12-15 LAB — HEPATIC FUNCTION PANEL
ALT: 12 U/L (ref 0–35)
AST: 19 U/L (ref 0–37)
Albumin: 4.5 g/dL (ref 3.5–5.2)
Alkaline Phosphatase: 76 U/L (ref 39–117)
Bilirubin, Direct: 0.1 mg/dL (ref 0.0–0.3)
Total Bilirubin: 0.5 mg/dL (ref 0.2–1.2)
Total Protein: 7.4 g/dL (ref 6.0–8.3)

## 2021-12-15 LAB — HEMOGLOBIN A1C: Hgb A1c MFr Bld: 5.7 % (ref 4.6–6.5)

## 2021-12-15 NOTE — Progress Notes (Addendum)
Patient ID: Anita Benjamin, female   DOB: 04/19/1934, 88 y.o.   MRN: [id number] ? ? ?Subjective:  ? ? Patient ID: Anita Benjamin, female    DOB: September 11, 1934, 88 y.o.   MRN: [id number] ? ?This visit occurred during the SARS-CoV-2 public health emergency.  Safety protocols were in place, including screening questions prior to the visit, additional usage of staff PPE, and extensive cleaning of exam room while observing appropriate contact time as indicated for disinfecting solutions.  ? ?Patient here for a scheduled follow up.  ?Chief Complaint  ?Patient presents with  ? Follow-up  ?  3 mo f/u - hypercholestrolemia. Pt also reports fainting March 1st. Resulted in fall, R lower leg swollen, bruised, painful.   ? .  ? ?HPI ?Reports she had a stomach virus 12/02/21.  Had been having diarrhea.  Was in the bathroom and stood up (after using the bathroom).  Stomach felt sick again (while standing) and she started feeling funny.  The next thing she remembers - she woke up on the floor.  Does not think she hit her head and has had no headache or dizziness since the fall.  She did injure her foot/ankle - right.  Has had persistent pain and swelling right foot/ankle/lower leg since.  Has been icing and soaking her foot.  Was not evaluated.  Reports swelling is better. Denied chest pain or sob.  No increased heart rate or palpitations.  No further GI symptoms.  Specifically denies any nausea, vomiting or diarrhea.   ? ? ?Past Medical History:  ?Diagnosis Date  ? Anemia   ? Atrial fibrillation ([place])   ? Atrial flutter ([place])   ? Fibrocystic breast disease   ? GERD (gastroesophageal reflux disease)   ? History of chicken pox   ? Hypercholesterolemia   ? Hypertension   ? ?Past Surgical History:  ?Procedure Laterality Date  ? BREAST BIOPSY Right 2014  ? NEG  ? BREAST LUMPECTOMY    ? BREAST LUMPECTOMY WITH SENTINEL LYMPH NODE BIOPSY Left 06/04/2020  ? Procedure: BREAST LUMPECTOMY WITH SENTINEL LYMPH NODE BX;  Surgeon: Robert Bellow, MD;   Location: [facility] ORS;  Service: General;  Laterality: Left;  ? CATARACT EXTRACTION    ? DILATION AND CURETTAGE OF UTERUS    ? Laser repair of torn retina    ? ?Family History  ?Problem Relation Age of Onset  ? Heart disease Mother 74  ?     Heart attack  ? Cancer Father 90  ?     lung cancer  ? Breast cancer Paternal Aunt   ?     x3  ? Colon cancer Neg Hx   ? ?Social History  ? ?Socioeconomic History  ? Marital status: Widowed  ?  Spouse name: Not on file  ? Number of children: 2  ? Years of education: Not on file  ? Highest education level: Not on file  ?Occupational History  ? Not on file  ?Tobacco Use  ? Smoking status: Never  ? Smokeless tobacco: Never  ?Vaping Use  ? Vaping Use: Never used  ?Substance and Sexual Activity  ? Alcohol use: No  ?  Alcohol/week: 0.0 standard drinks  ? Drug use: No  ? Sexual activity: Not on file  ?Other Topics Concern  ? Not on file  ?Social History Narrative  ? Not on file  ? ?Social Determinants of Health  ? ?Financial Resource Strain: Not on file  ?Food Insecurity: Not on file  ?Transportation Needs: Not  on file  ?Physical Activity: Not on file  ?Stress: Not on file  ?Social Connections: Not on file  ? ? ? ?Review of Systems  ?Constitutional:  Negative for appetite change and unexpected weight change.  ?HENT:  Negative for congestion and sinus pressure.   ?Respiratory:  Negative for cough, chest tightness and shortness of breath.   ?Cardiovascular:  Negative for chest pain and palpitations.  ?     Right lower leg/foot swelling as outlined.    ?Gastrointestinal:  Negative for abdominal pain, diarrhea, nausea and vomiting.  ?Genitourinary:  Negative for difficulty urinating and dysuria.  ?Musculoskeletal:  Negative for myalgias.  ?     Increased swelling and tenderness - right foot/right ankle and right lower leg.   ?Skin:  Negative for rash.  ?     Some redness and swelling - right lower leg as outlined.    ?Neurological:  Negative for dizziness, light-headedness and headaches.   ?Psychiatric/Behavioral:  Negative for agitation and dysphoric mood.   ? ?   ?Objective:  ?  ? ?BP 128/70 (BP Location: Left Arm, Patient Position: Sitting, Cuff Size: Small)   Pulse 70   Temp 98 ?F (36.7 ?C) (Oral)   Ht '5\' 6"'$  (1.676 m)   Wt 132 lb 9.6 oz (60.1 kg)   LMP 10/27/1989   SpO2 98%   BMI 21.40 kg/m?  ?Wt Readings from Last 3 Encounters:  ?12/15/21 132 lb 9.6 oz (60.1 kg)  ?10/26/21 131 lb 12.8 oz (59.8 kg)  ?09/11/21 131 lb (59.4 kg)  ? ? ?Physical Exam ?Vitals reviewed.  ?Constitutional:   ?   General: She is not in acute distress. ?   Appearance: Normal appearance.  ?HENT:  ?   Head: Normocephalic and atraumatic.  ?   Right Ear: External ear normal.  ?   Left Ear: External ear normal.  ?Eyes:  ?   General: No scleral icterus.    ?   Right eye: No discharge.     ?   Left eye: No discharge.  ?   Conjunctiva/sclera: Conjunctivae normal.  ?Neck:  ?   Thyroid: No thyromegaly.  ?Cardiovascular:  ?   Rate and Rhythm: Normal rate and regular rhythm.  ?Pulmonary:  ?   Effort: No respiratory distress.  ?   Breath sounds: Normal breath sounds. No wheezing.  ?Abdominal:  ?   General: Bowel sounds are normal.  ?   Palpations: Abdomen is soft.  ?   Tenderness: There is no abdominal tenderness.  ?Musculoskeletal:  ?   Cervical back: Neck supple. No tenderness.  ?   Comments: Increased swelling and erythema - right foot, right ankle and right lower leg.  Increased tenderness to palpation - top of foot, ankle.    ?Lymphadenopathy:  ?   Cervical: No cervical adenopathy.  ?Skin: ?   Findings: Erythema present. No rash.  ?Neurological:  ?   Mental Status: She is alert.  ?Psychiatric:     ?   Mood and Affect: Mood normal.     ?   Behavior: Behavior normal.  ? ? ? ?Outpatient Encounter Medications as of 12/15/2021  ?Medication Sig  ? acetaminophen (TYLENOL) 650 MG CR tablet Take 650 mg by mouth every 8 (eight) hours as needed for pain.  ? amLODipine (NORVASC) 5 MG tablet TAKE 1 TABLET BY MOUTH ONCE DAILY AT DINNER  TIME  ? Ascorbic Acid (VITAMIN C) 250 MG CHEW Chew 250 mg by mouth daily.   ? benzonatate (TESSALON) 100  MG capsule Take 1 capsule (100 mg total) by mouth 3 (three) times daily as needed for cough.  ? calcium citrate-vitamin D 500-500 MG-UNIT chewable tablet Chew 3 tablets by mouth daily.  ? ELIQUIS 2.5 MG TABS tablet TAKE 1 TABLET BY MOUTH TWICE DAILY  ? famotidine (PEPCID) 20 MG tablet Take 20 mg by mouth daily.  ? letrozole (FEMARA) 2.5 MG tablet Take 2.5 mg by mouth daily.  ? losartan (COZAAR) 100 MG tablet TAKE 1 TABLET BY MOUTH ONCE DAILY  ? methylPREDNISolone (MEDROL DOSEPAK) 4 MG TBPK tablet Take as directed in the package  ? Multiple Vitamin (MULTIVITAMIN) tablet Take 1 tablet by mouth daily.  ? propranolol (INDERAL) 10 MG tablet Take 1 tablet (10 mg total) by mouth 3 (three) times daily as needed (heart rate). Take one tab everyday,  may take extra for breakthrough elevated heart rate  ? sertraline (ZOLOFT) 50 MG tablet TAKE 1 TABLET BY MOUTH AT BEDTIME  ? ?No facility-administered encounter medications on file as of 12/15/2021.  ?  ? ?Lab Results  ?Component Value Date  ? WBC 6.0 12/15/2021  ? HGB 12.9 12/15/2021  ? HCT 38.2 12/15/2021  ? PLT 343.0 12/15/2021  ? GLUCOSE 100 (H) 12/15/2021  ? CHOL 185 12/15/2021  ? TRIG 164.0 (H) 12/15/2021  ? HDL 58.40 12/15/2021  ? LDLDIRECT 129.0 04/03/2021  ? [place] 94 12/15/2021  ? ALT 12 12/15/2021  ? AST 19 12/15/2021  ? NA 138 12/15/2021  ? K 4.9 12/15/2021  ? CL 101 12/15/2021  ? CREATININE 0.94 12/15/2021  ? BUN 18 12/15/2021  ? CO2 30 12/15/2021  ? TSH 3.08 04/03/2021  ? HGBA1C 5.7 12/15/2021  ? ? ?Pulmonary Function Test [facility] Only ? ?Result Date: 11/24/2021 ?Spirometry Data Is Acceptable and Reproducible Moderate Obstructive Airways Disease with Significant Broncho-Dilator Response Moderate Restrictive Lung disease Consider outpatient Pulmonary Consultation if needed Clinical Correlation Advised  ? ? ?   ?Assessment & Plan:  ? ?Problem List Items Addressed This  Visit   ? ? Aortic atherosclerosis ([place])  ?  Have discussed cholesterol medication.  Follow.   ?  ?  ? Atrial fibrillation ([place])  ?  Appears to be in SR.  No chest pain.  Stable.  Follow.  Continue eliquis.   ?  ?  ? [employer]

## 2021-12-16 ENCOUNTER — Ambulatory Visit
Admission: RE | Admit: 2021-12-16 | Discharge: 2021-12-16 | Disposition: A | Discharge status: Home / Self Care | Payer: Medicare Other | Source: Ambulatory Visit | Attending: Internal Medicine | Admitting: Internal Medicine

## 2021-12-16 ENCOUNTER — Other Ambulatory Visit: Payer: Self-pay | Admitting: Internal Medicine

## 2021-12-16 ENCOUNTER — Ambulatory Visit: Payer: Medicare Other

## 2021-12-16 ENCOUNTER — Encounter: Payer: Self-pay | Admitting: Internal Medicine

## 2021-12-16 ENCOUNTER — Other Ambulatory Visit: Payer: Self-pay

## 2021-12-16 DIAGNOSIS — M7989 Other specified soft tissue disorders: Secondary | ICD-10-CM | POA: Diagnosis present

## 2021-12-16 NOTE — Assessment & Plan Note (Signed)
Recheck cbc.  

## 2021-12-16 NOTE — Assessment & Plan Note (Signed)
Increased pain and swelling of right foot/ankle and lower leg s/p syncopal episode.  Exam as outlined.  Check xray. Discussed podiatry/ortho evaluation.   ?

## 2021-12-16 NOTE — Assessment & Plan Note (Signed)
Appears to be in SR.  No chest pain.  Stable.  Follow.  Continue eliquis.   ?

## 2021-12-16 NOTE — Progress Notes (Signed)
Opened in error

## 2021-12-16 NOTE — Assessment & Plan Note (Signed)
Continue to follow iron studies annually.  Saw hematology.   

## 2021-12-16 NOTE — Assessment & Plan Note (Signed)
afib on eliquis.  

## 2021-12-16 NOTE — Assessment & Plan Note (Signed)
Foot/ankler and lower leg swelling.  Had the recent syncopal episode as outlined.  Swelling and pain since.  Xray planned.   ?

## 2021-12-16 NOTE — Assessment & Plan Note (Signed)
Had the recent syncopal episode as outlined.  Occurred in the setting of having GI virus as outlined.  Appears to be c/w vasovagal episode.  No further syncopal or near syncopal episodes since.  Discussed repeat EKG today.  Appears to be in SR.  Wants to monitor. Sees cardiology.  Check routine labs.  Follow. Blood pressure ok.  ?

## 2021-12-16 NOTE — Assessment & Plan Note (Signed)
Saw Dr Byrnett - 05/2021.  Recommended f/u diagnostic bilateral mammogram in one year. Continue femara.  

## 2021-12-16 NOTE — Assessment & Plan Note (Signed)
Off cholesterol medication.  Have discussed.  Check lipid panel.  ?

## 2021-12-16 NOTE — Addendum Note (Signed)
Addended by: Alisa Graff on: 12/16/2021 07:11 AM ? ? Modules accepted: Orders ? ?

## 2021-12-16 NOTE — Assessment & Plan Note (Signed)
Continue amlodipine and losartan.  Blood pressure as outlined.  Follow pressures.  Follow metabolic panel.  

## 2021-12-16 NOTE — Assessment & Plan Note (Signed)
Have discussed cholesterol medication.  Follow.   

## 2021-12-18 ENCOUNTER — Other Ambulatory Visit: Payer: Self-pay

## 2021-12-18 ENCOUNTER — Ambulatory Visit (INDEPENDENT_AMBULATORY_CARE_PROVIDER_SITE_OTHER): Payer: Medicare Other | Admitting: Podiatry

## 2021-12-18 DIAGNOSIS — S93401A Sprain of unspecified ligament of right ankle, initial encounter: Secondary | ICD-10-CM

## 2021-12-18 NOTE — Progress Notes (Signed)
? ?  HPI: 88 y.o. female presenting today for follow-up evaluation regarding right foot and ankle pain with swelling secondary to a syncopal episode.  Patient states that on 12/02/2021 she sustained a fall injury at her home.  She does not remember twisting her left foot and ankle but she has been experiencing pain and swelling.  She says it is painful to walk and by the end of the day she has significant swelling to the foot.  She saw her PCP who referred her here for further treatment and evaluation ? ?Past Medical History:  ?Diagnosis Date  ? Anemia   ? Atrial fibrillation ([place])   ? Atrial flutter ([place])   ? Fibrocystic breast disease   ? GERD (gastroesophageal reflux disease)   ? History of chicken pox   ? Hypercholesterolemia   ? Hypertension   ? ? ?Past Surgical History:  ?Procedure Laterality Date  ? BREAST BIOPSY Right 2014  ? NEG  ? BREAST LUMPECTOMY    ? BREAST LUMPECTOMY WITH SENTINEL LYMPH NODE BIOPSY Left 06/04/2020  ? Procedure: BREAST LUMPECTOMY WITH SENTINEL LYMPH NODE BX;  Surgeon: Robert Bellow, MD;  Location: [facility] ORS;  Service: General;  Laterality: Left;  ? CATARACT EXTRACTION    ? DILATION AND CURETTAGE OF UTERUS    ? Laser repair of torn retina    ? ? ?Allergies  ?Allergen Reactions  ? Oruvail [Ketoprofen]   ? Relafen [Nabumetone]   ? Timentin [Ticarcillin-Pot Clavulanate]   ? ?  ?Physical Exam: ?General: The patient is alert and oriented x3 in no acute distress. ? ?Dermatology: Skin is warm, dry and supple bilateral lower extremities. Negative for open lesions or macerations. ? ?Vascular: Palpable pedal pulses bilaterally. Capillary refill within normal limits.  Edema noted right foot and ankle with some mild ecchymosis ? ?US VENOUS DOPPLER RLE 12/16/2021: ?IMPRESSION: ?Negative. ? ?Neurological: Light touch and protective threshold grossly intact ? ?Musculoskeletal Exam: No pedal deformities noted ? ?Radiographic Exam RT ANKLE 12/15/2021:  ?IMPRESSION: ?1. Diffuse soft tissue swelling. No  acute osseous abnormality. ? ?Radiographic exam RT FOOT 12/15/2021: ?IMPRESSION: ?1. Soft tissue swelling. ?2. No acute bone abnormality. ? ?Assessment: ?1.  Moderate ankle sprain right with associated edema ? ?Plan of Care:  ?1. Patient evaluated. X-Rays reviewed that were taken outpatient 12/16/2021.  ?2.  Patient declined cam boot today.  She says she has gotten better little by little day by day. ?3.  Continue wearing good supportive shoes and sneakers ?4.  Recommend compression daily.  Ace wrap provided ?5.  Return to clinic in 3 weeks.  If the patient continues to have swelling with pain in 3 weeks we will need to place the patient in a immobilization cam boot right foot ? ?  ?  ?Edrick Kins, DPM ?[place] ? ?Dr. Edrick Kins, DPM  ?  ?[address]                                        ?[place], [place] [address]                ?Office [phone]  ?Fax [phone] ? ? ? ? ?

## 2022-01-04 ENCOUNTER — Encounter: Payer: Self-pay | Admitting: Internal Medicine

## 2022-01-04 ENCOUNTER — Ambulatory Visit: Payer: Medicare Other | Admitting: Internal Medicine

## 2022-01-04 VITALS — BP 130/80 | HR 65 | Temp 99.2°F | Ht 66.0 in | Wt 131.6 lb

## 2022-01-04 DIAGNOSIS — J0100 Acute maxillary sinusitis, unspecified: Secondary | ICD-10-CM | POA: Diagnosis not present

## 2022-01-04 DIAGNOSIS — R051 Acute cough: Secondary | ICD-10-CM | POA: Diagnosis not present

## 2022-01-04 LAB — POC COVID19 BINAXNOW: SARS Coronavirus 2 Ag: NEGATIVE

## 2022-01-04 MED ORDER — LEVOFLOXACIN 500 MG PO TABS: 500.0000 mg | ORAL_TABLET | Freq: Every day | ORAL | 0 refills | Status: AC

## 2022-01-04 NOTE — Progress Notes (Signed)
? ?Subjective:  ?Patient ID: Anita Benjamin, female    DOB: [birth year]  Age: 88 y.o. MRN: [id number] ? ?CC: The primary encounter diagnosis was Acute cough. Diagnoses of Hypercalcemia and Acute non-recurrent maxillary sinusitis were also pertinent to this visit. ? ? ?This visit occurred during the SARS-CoV-2 public health emergency.  Safety protocols were in place, including screening questions prior to the visit, additional usage of staff PPE, and extensive cleaning of exam room while observing appropriate contact time as indicated for disinfecting solutions.   ? ?HPI ?Chandni Gagan presents for URI symptoms present for one week  ?Chief Complaint  ?Patient presents with  ? Acute Visit  ?  Cough and congestion started 4 days ago  ? ? ?Fever yesterday,  sore throat,  cough and congestion  ,  with purulent nasal drainage since last Thursday . Has ben having allergy symptoms prior to onset of symptoms.    Had bloody nasal discharge from right nostril.   Not taking any otc antihistamines  ? ? ?Last year was treated for RSV by urgent care but ended up in  the hospital with pneumonia.   ? ? ?Outpatient Medications Prior to Visit  ?Medication Sig Dispense Refill  ? acetaminophen (TYLENOL) 650 MG CR tablet Take 650 mg by mouth every 8 (eight) hours as needed for pain.    ? amLODipine (NORVASC) 5 MG tablet TAKE 1 TABLET BY MOUTH ONCE DAILY AT DINNER TIME 30 tablet 10  ? Ascorbic Acid (VITAMIN C) 250 MG CHEW Chew 250 mg by mouth daily.     ? benzonatate (TESSALON) 100 MG capsule Take 1 capsule (100 mg total) by mouth 3 (three) times daily as needed for cough. 20 capsule 0  ? calcium citrate-vitamin D 500-500 MG-UNIT chewable tablet Chew 3 tablets by mouth daily.    ? ELIQUIS 2.5 MG TABS tablet TAKE 1 TABLET BY MOUTH TWICE DAILY 180 tablet 1  ? famotidine (PEPCID) 20 MG tablet Take 20 mg by mouth daily.    ? letrozole (FEMARA) 2.5 MG tablet Take 2.5 mg by mouth daily.    ? losartan (COZAAR) 100 MG tablet TAKE 1 TABLET BY MOUTH ONCE  DAILY 90 tablet 1  ? methylPREDNISolone (MEDROL DOSEPAK) 4 MG TBPK tablet Take as directed in the package 21 tablet 0  ? Multiple Vitamin (MULTIVITAMIN) tablet Take 1 tablet by mouth daily.    ? propranolol (INDERAL) 10 MG tablet Take 1 tablet (10 mg total) by mouth 3 (three) times daily as needed (heart rate). Take one tab everyday,  may take extra for breakthrough elevated heart rate 270 tablet 3  ? sertraline (ZOLOFT) 50 MG tablet TAKE 1 TABLET BY MOUTH AT BEDTIME 30 tablet 2  ? ?No facility-administered medications prior to visit.  ? ? ?Review of Systems; ? ?Patient denies headache, fevers, malaise, unintentional weight loss, skin rash, eye pain, sinus congestion and sinus pain, sore throat, dysphagia,  hemoptysis , cough, dyspnea, wheezing, chest pain, palpitations, orthopnea, edema, abdominal pain, nausea, melena, diarrhea, constipation, flank pain, dysuria, hematuria, urinary  Frequency, nocturia, numbness, tingling, seizures,  Focal weakness, Loss of consciousness,  Tremor, insomnia, depression, anxiety, and suicidal ideation.   ? ? ? ?Objective:  ?BP 130/80 (BP Location: Left Arm, Patient Position: Sitting, Cuff Size: Normal)   Pulse 65   Temp 99.2 ?F (37.3 ?C) (Oral)   Ht '5\' 6"'$  (1.676 m)   Wt 131 lb 9.6 oz (59.7 kg)   LMP 10/27/1989   SpO2 96%   BMI 21.24  kg/m?  ? ?BP Readings from Last 3 Encounters:  ?01/04/22 130/80  ?12/15/21 128/70  ?10/26/21 118/66  ? ? ?Wt Readings from Last 3 Encounters:  ?01/04/22 131 lb 9.6 oz (59.7 kg)  ?12/15/21 132 lb 9.6 oz (60.1 kg)  ?10/26/21 131 lb 12.8 oz (59.8 kg)  ? ? ?General appearance: alert, cooperative and appears stated age ?Ears: normal TM's and external ear canals both ears ?Throat: lips, mucosa, and tongue normal; teeth and gums normal ?Neck: no adenopathy, no carotid bruit, supple, symmetrical, trachea midline and thyroid not enlarged, symmetric, no tenderness/mass/nodules ?Back: symmetric, no curvature. ROM normal. No CVA tenderness. ?Lungs: clear to  auscultation bilaterally ?Heart: regular rate and rhythm, S1, S2 normal, no murmur, click, rub or gallop ?Abdomen: soft, non-tender; bowel sounds normal; no masses,  no organomegaly ?Pulses: 2+ and symmetric ?Skin: Skin color, texture, turgor normal. No rashes or lesions ?Lymph nodes: Cervical, supraclavicular, and axillary nodes normal. ? ?Lab Results  ?Component Value Date  ? HGBA1C 5.7 12/15/2021  ? HGBA1C 5.7 08/12/2021  ? HGBA1C 5.7 04/03/2021  ? ? ?Lab Results  ?Component Value Date  ? CREATININE 0.94 12/15/2021  ? CREATININE 0.89 09/11/2021  ? CREATININE 0.94 08/12/2021  ? ? ?Lab Results  ?Component Value Date  ? WBC 6.0 12/15/2021  ? HGB 12.9 12/15/2021  ? HCT 38.2 12/15/2021  ? PLT 343.0 12/15/2021  ? GLUCOSE 100 (H) 12/15/2021  ? CHOL 185 12/15/2021  ? TRIG 164.0 (H) 12/15/2021  ? HDL 58.40 12/15/2021  ? LDLDIRECT 129.0 04/03/2021  ? Rake 94 12/15/2021  ? ALT 12 12/15/2021  ? AST 19 12/15/2021  ? NA 138 12/15/2021  ? K 4.9 12/15/2021  ? CL 101 12/15/2021  ? CREATININE 0.94 12/15/2021  ? BUN 18 12/15/2021  ? CO2 30 12/15/2021  ? TSH 3.08 04/03/2021  ? HGBA1C 5.7 12/15/2021  ? ? ?US Venous Img Lower Unilateral Right ? ?Result Date: 12/16/2021 ?CLINICAL DATA:  Right leg swelling. EXAM: RIGHT LOWER EXTREMITY VENOUS DOPPLER ULTRASOUND TECHNIQUE: Gray-scale sonography with compression, as well as color and duplex ultrasound, were performed to evaluate the deep venous system(s) from the level of the common femoral vein through the popliteal and proximal calf veins. COMPARISON:  None. FINDINGS: VENOUS Normal compressibility of the common femoral, superficial femoral, and popliteal veins, as well as the visualized calf veins. Visualized portions of profunda femoral vein and great saphenous vein unremarkable. No filling defects to suggest DVT on grayscale or color Doppler imaging. Doppler waveforms show normal direction of venous flow, normal respiratory plasticity and response to augmentation. Limited views of  the contralateral common femoral vein are unremarkable. OTHER None. Limitations: None. IMPRESSION: Negative. Electronically Signed   By: Titus Dubin M.D.   On: 12/16/2021 15:19  ? ? ?Assessment & Plan:  ? ?Problem List Items Addressed This Visit   ? ? Sinusitis, acute, maxillary  ?  Secondary to prolonged congestion from untreated allergic rhinitis.  COVID 19 test negative in house.  Levaquin,  antihistamin prescribed.  ?  ?  ? Relevant Medications  ? levofloxacin (LEVAQUIN) 500 MG tablet  ? Hypercalcemia  ?  Checking PTH/ionized calcium and PTHRP given h/o breast ca ?  ?  ? Relevant Orders  ? PTH, Intact and Calcium  ? PTH-Related Peptide  ? Cough - Primary  ? Relevant Orders  ? POC COVID-19 (Completed)  ? ? ?I spent 30 minutes dedicated to the care of this patient on the date of this encounter to include pre-visit review of  patient's medical history,  most recent  office visits with me and patient's specialists,  most recent ER visit/hospitalization, EKG, imaging studies, Face-to-face time with the patient , and post visit ordering of testing and therapeutics.   ? ?Follow-up: No follow-ups on file. ? ? ?Crecencio Mc, MD ?

## 2022-01-04 NOTE — Assessment & Plan Note (Signed)
Checking PTH/ionized calcium and PTHRP given h/o breast ca ?

## 2022-01-04 NOTE — Assessment & Plan Note (Signed)
Secondary to prolonged congestion from untreated allergic rhinitis.  COVID 19 test negative in house.  Levaquin,  antihistamin prescribed.  ?

## 2022-01-04 NOTE — Patient Instructions (Signed)
Your COVID test was negative ? ?I am treating you for bacterial sinusitis which is probably a  complication from your allergies causing   persistent sinus congestion. ? ? I am prescribing an antibiotic (levaquin)   To manage the infection and the inflammation in your ear/sinuses.  ?Use benzonatate capsules   FOR THE  COUGH. ? ?I also advise use of the following OTC meds to help with your other symptoms.  ? ?Claritin or allegra  twice daily for the allergies.  (These are OTC antihistamines and are available as loratadine and fexofenadine)  ? ?Take generic OTC benadryl 25 mg at bedtime  for the drainage ? ?flush your sinuses twice daily with NeilMed sinus rinse  (do over the sink because if you do it right you will spit out globs of mucus) ? ?Please take a probiotic ( Align, Flora que or Culturelle) OR A GENERIC EQUIVALENT for three weeks since you are taking an  antibiotic to prevent a very serious antibiotic associated infection  Called clostridium dificile colitis that can cause diarrhea, multi organ failure, sepsis and death if not managed.   ?

## 2022-01-05 ENCOUNTER — Encounter: Payer: Self-pay | Admitting: Internal Medicine

## 2022-01-05 ENCOUNTER — Ambulatory Visit: Payer: Medicare Other | Admitting: Podiatry

## 2022-01-05 ENCOUNTER — Other Ambulatory Visit: Payer: Medicare Other

## 2022-01-05 DIAGNOSIS — J0100 Acute maxillary sinusitis, unspecified: Secondary | ICD-10-CM

## 2022-01-05 NOTE — Telephone Encounter (Signed)
Pt daughter Ivin Booty advised ?

## 2022-01-05 NOTE — Telephone Encounter (Signed)
Message has been sent to provider.

## 2022-01-05 NOTE — Telephone Encounter (Signed)
Saw Dr Derrel Nip yesterday.  Prescribed levaquin.  (Cipro and levaquin are in the same family) - same family of medication.  Let me know if still concerns.   ?

## 2022-01-05 NOTE — Telephone Encounter (Signed)
Pt daughter is calling about previous message in mychart ?

## 2022-01-06 MED ORDER — DOXYCYCLINE HYCLATE 100 MG PO TABS: 100.0000 mg | ORAL_TABLET | Freq: Two times a day (BID) | ORAL | 0 refills | Status: DC

## 2022-01-06 NOTE — Telephone Encounter (Signed)
Called and spoke to Anita Benjamin.  She has already started the levaquin.  We discussed levaquin and possible side effects.  Discussed other treatment.  She elects to continue with levaquin.  Will call if problems and will call with update.  ?

## 2022-01-06 NOTE — Assessment & Plan Note (Signed)
Patient apprehensive about potential side effedts of levaquin and requesting cipro instead.   Sending doxcycline ?

## 2022-01-07 NOTE — Telephone Encounter (Signed)
Pt's daughter is aware of the medication that was sent in to take instead of the levaquin. ?

## 2022-01-08 ENCOUNTER — Ambulatory Visit (INDEPENDENT_AMBULATORY_CARE_PROVIDER_SITE_OTHER): Payer: Medicare Other | Admitting: Pulmonary Disease

## 2022-01-08 ENCOUNTER — Encounter: Payer: Self-pay | Admitting: Pulmonary Disease

## 2022-01-08 VITALS — BP 134/78 | HR 65 | Temp 97.7°F | Ht 66.0 in | Wt 131.4 lb

## 2022-01-08 DIAGNOSIS — J0100 Acute maxillary sinusitis, unspecified: Secondary | ICD-10-CM

## 2022-01-08 DIAGNOSIS — R0602 Shortness of breath: Secondary | ICD-10-CM | POA: Diagnosis not present

## 2022-01-08 DIAGNOSIS — J4540 Moderate persistent asthma, uncomplicated: Secondary | ICD-10-CM | POA: Diagnosis not present

## 2022-01-08 LAB — PTH-RELATED PEPTIDE: PTH-Related Protein (PTH-RP): 16 pg/mL (ref 11–20)

## 2022-01-08 LAB — PTH, INTACT AND CALCIUM
Calcium: 8.8 mg/dL (ref 8.6–10.4)
PTH: 12 pg/mL — ABNORMAL LOW (ref 16–77)

## 2022-01-08 MED ORDER — PROAIR RESPICLICK 108 (90 BASE) MCG/ACT IN AEPB: 2.0000 | INHALATION_SPRAY | Freq: Four times a day (QID) | RESPIRATORY_TRACT | 2 refills | Status: DC | PRN

## 2022-01-08 NOTE — Patient Instructions (Signed)
We have given you the inhaler for as needed use only.  You can use it up to 4 times a day.  See if this helps when you start having issues with cough or shortness of breath. ? ?You do have mild to moderate asthma and may need to put you on a maintenance inhaler that is an inhaler that you use every day. ? ?We will see you in follow-up in 3 to 4 months time call sooner should any new problems arise. ?

## 2022-01-08 NOTE — Progress Notes (Signed)
Subjective:    Patient ID: Anita Benjamin, female    DOB: May 04, 1934, 88 y.o.   MRN: [id number] Patient Care Team: Einar Pheasant, MD as PCP - General (Internal Medicine) Minna Merritts, MD as PCP - Cardiology (Cardiology) Bary Castilla Forest Gleason, MD as Consulting Physician (General Surgery) Minna Merritts, MD as Consulting Physician (Cardiology) Noreene Filbert, MD as Referring Physician (Radiation Oncology)  Chief Complaint  Patient presents with   Follow-up    SOB with exertion and prod cough with clear sputum.  Currently taking Levaquin.    HPI Patient presents for follow-up.  Initial visit here was on 06 October 2021.  For the details of that visit please refer to that note.  At that time she was seen for evaluation of a cough.  This is a scheduled visit.  We had discussed the findings of the PFTs with the patient over the phone.  Recommended starting Breo Ellipta.  However the patient was reluctant to do this.  We went over the findings of the PFTs.  It looks like she has an element of asthma.  I at least recommend a rescue inhaler.  This to see if it will control her cough and shortness of breath.  Recently was started on Levaquin due to sinusitis.  She has not noticed any change on the quality of her cough with this medication.  She has not had any fevers, chills or sweats.  Sputum clear, no hemoptysis.  Nasal congestion improving somewhat with Levaquin and antihistamine.  No orthopnea or paroxysmal nocturnal dyspnea no lower extremity edema or calf tenderness.  No chest pain.  DATA 11/20/2021 PFTs: FEV1 1.6 L or 64% predicted, FVC 2.26 L or 85% of predicted, FEV1/FVC 56%, there is significant bronchodilator response with a net change of 14% and FEV1.  Lung volumes were reduced however the test was not reproducible for lung volumes or diffusion capacity.  On the effort independent portion of the curve small airways component is noted.  Diffusion capacity mildly reduced not corrected for  hemoglobin.  Overall patient has changes consistent with asthma.  Review of Systems A 10 point review of systems was performed and it is as noted above otherwise negative.  Patient Active Problem List   Diagnosis Date Noted   Sinusitis, acute, maxillary 01/04/2022   Hypercalcemia 01/04/2022   Right leg swelling 12/16/2021   Foot pain, right 12/15/2021   Right ankle pain 12/15/2021   Thrombophilia ([place]) 09/12/2021   Decreased GFR 09/11/2021   Hemochromatosis, hereditary ([place]) 04/15/2021   Hemochromatosis carrier 04/05/2021   Aortic atherosclerosis ([place]) 04/05/2021   Headache 04/03/2021   Atrial fibrillation ([place]) 01/15/2021   Anemia 01/12/2021   Atrial fibrillation with RVR ([place]) 01/01/2021   Hyponatremia 01/01/2021   CAP (community acquired pneumonia) 12/25/2020   Breast cancer ([place]) 06/09/2020   Diarrhea 04/11/2019   Hair loss 06/10/2017   Fatigue 02/19/2017   Chest pain 02/12/2017   Encounter for anticoagulation discussion and counseling 10/05/2016   Cough 10/01/2016   Gas 10/27/2015   Dysuria 09/17/2015   Left hip pain 07/21/2015   Neck pain 06/09/2015   Syncope 03/25/2015   Left wrist pain 03/25/2015   SOB (shortness of breath) 12/01/2014   Abdominal discomfort 12/01/2014   Atrial flutter ([place]) 11/15/2014   Stress 11/08/2014   Difficulty sleeping 11/08/2014   Health care maintenance 11/08/2014   Adjustment disorder 10/14/2014   Tachycardia 07/17/2014   Hyperkalemia 07/17/2014   History of colonic polyps 03/09/2014   Decreased sense of  taste 03/09/2014   Abnormal mammogram, unspecified 07/31/2013   Abnormal mammogram 01/20/2013   Essential hypertension 10/29/2012   Hypercholesterolemia 10/29/2012   Leukopenia 10/29/2012   Social History   Tobacco Use   Smoking status: Never   Smokeless tobacco: Never  Substance Use Topics   Alcohol use: No    Alcohol/week: 0.0 standard drinks   Allergies  Allergen Reactions   Oruvail [Ketoprofen]    Relafen  [Nabumetone]    Timentin [Ticarcillin-Pot Clavulanate]     Current Meds  Medication Sig   acetaminophen (TYLENOL) 650 MG CR tablet Take 650 mg by mouth every 8 (eight) hours as needed for pain.   Albuterol Sulfate (PROAIR RESPICLICK) 588 (90 Base) MCG/ACT AEPB Inhale 2 puffs into the lungs every 6 (six) hours as needed.   amLODipine (NORVASC) 5 MG tablet TAKE 1 TABLET BY MOUTH ONCE DAILY AT DINNER TIME   Ascorbic Acid (VITAMIN C) 250 MG CHEW Chew 250 mg by mouth daily.    benzonatate (TESSALON) 100 MG capsule Take 1 capsule (100 mg total) by mouth 3 (three) times daily as needed for cough.   calcium citrate-vitamin D 500-500 MG-UNIT chewable tablet Chew 3 tablets by mouth daily.   ELIQUIS 2.5 MG TABS tablet TAKE 1 TABLET BY MOUTH TWICE DAILY   famotidine (PEPCID) 20 MG tablet Take 20 mg by mouth daily.   letrozole (FEMARA) 2.5 MG tablet Take 2.5 mg by mouth daily.   levofloxacin (LEVAQUIN) 500 MG tablet Take 1 tablet (500 mg total) by mouth daily for 7 days.   losartan (COZAAR) 100 MG tablet TAKE 1 TABLET BY MOUTH ONCE DAILY   Multiple Vitamin (MULTIVITAMIN) tablet Take 1 tablet by mouth daily.   propranolol (INDERAL) 10 MG tablet Take 1 tablet (10 mg total) by mouth 3 (three) times daily as needed (heart rate). Take one tab everyday,  may take extra for breakthrough elevated heart rate   sertraline (ZOLOFT) 50 MG tablet TAKE 1 TABLET BY MOUTH AT BEDTIME        Objective:   Physical Exam BP 134/78 (BP Location: Right Arm, Cuff Size: Normal)   Pulse 65   Temp 97.7 F (36.5 C) (Temporal)   Ht '5\' 6"'$  (1.676 m)   Wt 131 lb 6.4 oz (59.6 kg)   LMP 10/27/1989   SpO2 96%   BMI 21.21 kg/m  GENERAL: Thin woman, no acute distress, does sound very nasally congested, fully ambulatory.  No conversational dyspnea. HEAD: Normocephalic, atraumatic.  EYES: Pupils equal, round, reactive to light.  No scleral icterus.  MOUTH: Nose/mouth/throat not examined due to masking requirements for COVID  19. NECK: Supple. No thyromegaly. Trachea midline. No JVD.  No adenopathy. PULMONARY: Good air entry bilaterally.  No adventitious sounds. CARDIOVASCULAR: S1 and S2. Regular rate and rhythm.  No rubs, murmurs or gallops heard. ABDOMEN: Benign. MUSCULOSKELETAL: No joint deformity, no clubbing, no edema.  NEUROLOGIC: No focal deficit, no gait disturbance, speech is fluent. SKIN: Intact,warm,dry. PSYCH: Mood and behavior normal.     Assessment & Plan:     ICD-10-CM   1. Moderate persistent asthma without complication  F02.77    Recommend Breo Ellipta, patient declines Prescribed as needed albuterol Advised the patient on pathophysiology of asthma    2. Acute non-recurrent maxillary sinusitis  J01.00    Being treated with Levaquin per primary care    3. SOB (shortness of breath)  R06.02    No decompensation      Meds ordered this encounter  Medications  Albuterol Sulfate (PROAIR RESPICLICK) 539 (90 Base) MCG/ACT AEPB    Sig: Inhale 2 puffs into the lungs every 6 (six) hours as needed.    Dispense:  1 each    Refill:  2   Explained to the patient that we will give her a trial of albuterol to use as needed up to 4 times a day.  She will let us know how her cough and shortness of breath goes with this inhaler.  I did gust with the patient the pathophysiology of asthma and that I think she has moderate persistent asthma.  She should consider maintenance inhaler therapy.  She is concerned because her husband who had COPD was on inhalers and she felt that he was "addicted to them".  I advised her that asthma is a different entity from COPD.  Patient seem to understand.  We will see her in follow-up in 3 to 4 months time she is to contact us prior to that time should any new problems arise.  Renold Don, MD Advanced Bronchoscopy PCCM [facility]    *This note was dictated using voice recognition software/Dragon.  Despite best efforts to proofread, errors can  occur which can change the meaning. Any transcriptional errors that result from this process are unintentional and may not be fully corrected at the time of dictation.

## 2022-01-13 ENCOUNTER — Other Ambulatory Visit: Payer: Self-pay | Admitting: Internal Medicine

## 2022-02-19 ENCOUNTER — Ambulatory Visit: Payer: Medicare Other | Admitting: Internal Medicine

## 2022-02-19 ENCOUNTER — Ambulatory Visit: Payer: Medicare Other | Admitting: Radiation Oncology

## 2022-02-19 ENCOUNTER — Encounter: Payer: Self-pay | Admitting: Internal Medicine

## 2022-02-19 VITALS — BP 140/68 | HR 54 | Temp 97.6°F | Resp 14 | Ht 66.0 in | Wt 135.8 lb

## 2022-02-19 DIAGNOSIS — I700 Atherosclerosis of aorta: Secondary | ICD-10-CM

## 2022-02-19 DIAGNOSIS — I10 Essential (primary) hypertension: Secondary | ICD-10-CM

## 2022-02-19 DIAGNOSIS — I4891 Unspecified atrial fibrillation: Secondary | ICD-10-CM

## 2022-02-19 DIAGNOSIS — Z1231 Encounter for screening mammogram for malignant neoplasm of breast: Secondary | ICD-10-CM

## 2022-02-19 DIAGNOSIS — D6859 Other primary thrombophilia: Secondary | ICD-10-CM | POA: Diagnosis not present

## 2022-02-19 DIAGNOSIS — M25571 Pain in right ankle and joints of right foot: Secondary | ICD-10-CM

## 2022-02-19 DIAGNOSIS — R739 Hyperglycemia, unspecified: Secondary | ICD-10-CM

## 2022-02-19 DIAGNOSIS — R059 Cough, unspecified: Secondary | ICD-10-CM

## 2022-02-19 DIAGNOSIS — F439 Reaction to severe stress, unspecified: Secondary | ICD-10-CM

## 2022-02-19 DIAGNOSIS — E7800 Pure hypercholesterolemia, unspecified: Secondary | ICD-10-CM

## 2022-02-19 DIAGNOSIS — C50919 Malignant neoplasm of unspecified site of unspecified female breast: Secondary | ICD-10-CM

## 2022-02-19 DIAGNOSIS — D72819 Decreased white blood cell count, unspecified: Secondary | ICD-10-CM

## 2022-02-19 NOTE — Progress Notes (Unsigned)
Patient ID: Anita Benjamin, female   DOB: 12/19/1933, 88 y.o.   MRN: [id number]   Subjective:    Patient ID: Anita Benjamin, female    DOB: 1934-07-11, 88 y.o.   MRN: [id number]  This visit occurred during the SARS-CoV-2 public health emergency.  Safety protocols were in place, including screening questions prior to the visit, additional usage of staff PPE, and extensive cleaning of exam room while observing appropriate contact time as indicated for disinfecting solutions.   Patient here for a scheduled follow up.   Chief Complaint  Patient presents with   Follow-up    Follow up for HTN   Hypertension   Hyperlipidemia   .   HPI    Past Medical History:  Diagnosis Date   Anemia    Atrial fibrillation ([facility])    Atrial flutter ([place])    Fibrocystic breast disease    GERD (gastroesophageal reflux disease)    History of chicken pox    Hypercholesterolemia    Hypertension    Past Surgical History:  Procedure Laterality Date   BREAST BIOPSY Right 2014   NEG   BREAST LUMPECTOMY     BREAST LUMPECTOMY WITH SENTINEL LYMPH NODE BIOPSY Left 06/04/2020   Procedure: BREAST LUMPECTOMY WITH SENTINEL LYMPH NODE BX;  Surgeon: Robert Bellow, MD;  Location: [facility] ORS;  Service: General;  Laterality: Left;   CATARACT EXTRACTION     DILATION AND CURETTAGE OF UTERUS     Laser repair of torn retina     Family History  Problem Relation Age of Onset   Heart disease Mother 26       Heart attack   Cancer Father 49       lung cancer   Breast cancer Paternal Aunt        x3   Colon cancer Neg Hx    Social History   Socioeconomic History   Marital status: Widowed    Spouse name: Not on file   Number of children: 2   Years of education: Not on file   Highest education level: Not on file  Occupational History   Not on file  Tobacco Use   Smoking status: Never   Smokeless tobacco: Never  Vaping Use   Vaping Use: Never used  Substance and Sexual Activity   Alcohol use: No    Alcohol/week:  0.0 standard drinks   Drug use: No   Sexual activity: Not on file  Other Topics Concern   Not on file  Social History Narrative   Not on file   Social Determinants of Health   Financial Resource Strain: Not on file  Food Insecurity: Not on file  Transportation Needs: Not on file  Physical Activity: Not on file  Stress: Not on file  Social Connections: Not on file     Review of Systems     Objective:     BP 140/68 (BP Location: Right Arm, Patient Position: Sitting, Cuff Size: Small)   Pulse (!) 54   Temp 97.6 F (36.4 C) (Temporal)   Resp 14   Ht '5\' 6"'$  (1.676 m)   Wt 135 lb 12.8 oz (61.6 kg)   LMP 10/27/1989   SpO2 97%   BMI 21.92 kg/m  Wt Readings from Last 3 Encounters:  02/19/22 135 lb 12.8 oz (61.6 kg)  01/08/22 131 lb 6.4 oz (59.6 kg)  01/04/22 131 lb 9.6 oz (59.7 kg)    Physical Exam   Outpatient Encounter Medications as of 02/19/2022  Medication  Sig   acetaminophen (TYLENOL) 650 MG CR tablet Take 650 mg by mouth every 8 (eight) hours as needed for pain.   Albuterol Sulfate (PROAIR RESPICLICK) 130 (90 Base) MCG/ACT AEPB Inhale 2 puffs into the lungs every 6 (six) hours as needed.   amLODipine (NORVASC) 5 MG tablet TAKE 1 TABLET BY MOUTH ONCE DAILY AT DINNER TIME   Ascorbic Acid (VITAMIN C) 250 MG CHEW Chew 250 mg by mouth daily.    benzonatate (TESSALON) 100 MG capsule Take 1 capsule (100 mg total) by mouth 3 (three) times daily as needed for cough.   calcium citrate-vitamin D 500-500 MG-UNIT chewable tablet Chew 3 tablets by mouth daily.   ELIQUIS 2.5 MG TABS tablet TAKE 1 TABLET BY MOUTH TWICE DAILY   famotidine (PEPCID) 20 MG tablet Take 20 mg by mouth daily.   letrozole (FEMARA) 2.5 MG tablet Take 2.5 mg by mouth daily.   losartan (COZAAR) 100 MG tablet TAKE 1 TABLET BY MOUTH ONCE DAILY   Multiple Vitamin (MULTIVITAMIN) tablet Take 1 tablet by mouth daily.   propranolol (INDERAL) 10 MG tablet Take 1 tablet (10 mg total) by mouth 3 (three) times daily  as needed (heart rate). Take one tab everyday,  may take extra for breakthrough elevated heart rate   sertraline (ZOLOFT) 50 MG tablet TAKE 1 TABLET BY MOUTH AT BEDTIME   No facility-administered encounter medications on file as of 02/19/2022.     Lab Results  Component Value Date   WBC 6.0 12/15/2021   HGB 12.9 12/15/2021   HCT 38.2 12/15/2021   PLT 343.0 12/15/2021   GLUCOSE 100 (H) 12/15/2021   CHOL 185 12/15/2021   TRIG 164.0 (H) 12/15/2021   HDL 58.40 12/15/2021   LDLDIRECT 129.0 04/03/2021   LDLCALC 94 12/15/2021   ALT 12 12/15/2021   AST 19 12/15/2021   NA 138 12/15/2021   K 4.9 12/15/2021   CL 101 12/15/2021   CREATININE 0.94 12/15/2021   BUN 18 12/15/2021   CO2 30 12/15/2021   TSH 3.08 04/03/2021   HGBA1C 5.7 12/15/2021    US Venous Img Lower Unilateral Right  Result Date: 12/16/2021 CLINICAL DATA:  Right leg swelling. EXAM: RIGHT LOWER EXTREMITY VENOUS DOPPLER ULTRASOUND TECHNIQUE: Gray-scale sonography with compression, as well as color and duplex ultrasound, were performed to evaluate the deep venous system(s) from the level of the common femoral vein through the popliteal and proximal calf veins. COMPARISON:  None. FINDINGS: VENOUS Normal compressibility of the common femoral, superficial femoral, and popliteal veins, as well as the visualized calf veins. Visualized portions of profunda femoral vein and great saphenous vein unremarkable. No filling defects to suggest DVT on grayscale or color Doppler imaging. Doppler waveforms show normal direction of venous flow, normal respiratory plasticity and response to augmentation. Limited views of the contralateral common femoral vein are unremarkable. OTHER None. Limitations: None. IMPRESSION: Negative. Electronically Signed   By: Titus Dubin M.D.   On: 12/16/2021 15:19       Assessment & Plan:   Problem List Items Addressed This Visit   None Visit Diagnoses     Encounter for screening mammogram for malignant  neoplasm of breast    -  Primary   Relevant Orders   MM 3D SCREEN BREAST BILATERAL        Einar Pheasant, MD

## 2022-02-20 ENCOUNTER — Encounter: Payer: Self-pay | Admitting: Internal Medicine

## 2022-02-20 DIAGNOSIS — R739 Hyperglycemia, unspecified: Secondary | ICD-10-CM | POA: Insufficient documentation

## 2022-02-20 NOTE — Assessment & Plan Note (Signed)
Continue to follow iron studies annually.  Saw hematology.   

## 2022-02-20 NOTE — Assessment & Plan Note (Signed)
Follow met b and a1c.  

## 2022-02-20 NOTE — Assessment & Plan Note (Signed)
Saw Dr Patsey Berthold. Diagnosed with mild to moderate asthma.  Has rescue inhaler if needed.  Doing better.  Follow.

## 2022-02-20 NOTE — Assessment & Plan Note (Signed)
Right ankle sprain.  Evaluated by Dr Amalia Hailey.  Stable.  Follow.

## 2022-02-20 NOTE — Assessment & Plan Note (Signed)
Saw Dr Byrnett - 05/2021.  Recommended f/u diagnostic bilateral mammogram in one year. Continue femara.  

## 2022-02-20 NOTE — Assessment & Plan Note (Signed)
Continue amlodipine and losartan.  Blood pressure as outlined.  Follow pressures.  Follow metabolic panel.  

## 2022-02-20 NOTE — Assessment & Plan Note (Signed)
Continue zoloft.  Stable.  

## 2022-02-20 NOTE — Assessment & Plan Note (Signed)
Follow cbc.  

## 2022-02-20 NOTE — Assessment & Plan Note (Signed)
Have discussed cholesterol medication.  Follow.   

## 2022-02-20 NOTE — Assessment & Plan Note (Signed)
Appears to be in SR.  No chest pain.  Stable.  Follow.  Continue eliquis.

## 2022-02-20 NOTE — Assessment & Plan Note (Signed)
afib on eliquis.  

## 2022-02-20 NOTE — Assessment & Plan Note (Signed)
Off cholesterol medication.  Have discussed.  Check lipid panel.

## 2022-02-22 ENCOUNTER — Other Ambulatory Visit (INDEPENDENT_AMBULATORY_CARE_PROVIDER_SITE_OTHER): Payer: Medicare Other

## 2022-02-22 ENCOUNTER — Telehealth: Payer: Self-pay

## 2022-02-22 ENCOUNTER — Other Ambulatory Visit: Payer: Self-pay

## 2022-02-22 ENCOUNTER — Ambulatory Visit (INDEPENDENT_AMBULATORY_CARE_PROVIDER_SITE_OTHER): Payer: Medicare Other | Admitting: Internal Medicine

## 2022-02-22 ENCOUNTER — Encounter: Payer: Self-pay | Admitting: Internal Medicine

## 2022-02-22 DIAGNOSIS — D6859 Other primary thrombophilia: Secondary | ICD-10-CM | POA: Diagnosis not present

## 2022-02-22 DIAGNOSIS — I4891 Unspecified atrial fibrillation: Secondary | ICD-10-CM

## 2022-02-22 DIAGNOSIS — R300 Dysuria: Secondary | ICD-10-CM

## 2022-02-22 DIAGNOSIS — I10 Essential (primary) hypertension: Secondary | ICD-10-CM

## 2022-02-22 LAB — URINALYSIS, ROUTINE W REFLEX MICROSCOPIC
Bilirubin Urine: NEGATIVE
Ketones, ur: NEGATIVE
Nitrite: POSITIVE — AB
Specific Gravity, Urine: 1.01 (ref 1.000–1.030)
Total Protein, Urine: 100 — AB
Urine Glucose: NEGATIVE
Urobilinogen, UA: 0.2 (ref 0.0–1.0)
pH: 6 (ref 5.0–8.0)

## 2022-02-22 MED ORDER — NITROFURANTOIN MONOHYD MACRO 100 MG PO CAPS: 100.0000 mg | ORAL_CAPSULE | Freq: Two times a day (BID) | ORAL | 0 refills | Status: DC

## 2022-02-22 NOTE — Progress Notes (Signed)
Patient ID: Anita Benjamin, female   DOB: 1933/10/29, 88 y.o.   MRN: [id number]   Virtual Visit via telephone Note  All issues noted in this document were discussed and addressed.  No physical exam was performed (except for noted visual exam findings with Video Visits).   I connected with [name] by telephone and verified that I am speaking with the correct person using two identifiers. Location patient: home Location provider: work  Persons participating in the telephone visit: patient, provider  The limitations, risks, security and privacy concerns of performing an evaluation and management service by telephone and the availability of in person appointments have been discussed.  It has  also been discussed with the patient that there may be a patient responsible charge related to this service. The patient expressed understanding and agreed to proceed.   Reason for visit: work in appt  HPI: Work in with concerns regarding a possible UTI.  Reports that starting pm of 02/19/22 - temperature 99.  Noticed - felt bad.  Dysuria.  No hematuria.  No increased abdominal pain.  No significant back pain.  Some decreased appetite today.  No nausea or vomiting.  Noticed the night symptoms started - teeth chattering.  Some better and then noticed not feel as good yesterday pm.  Temp 101 last night.  No chest pain.  Breathing stable overall.  Feels similar to previous UTIs.     ROS: See pertinent positives and negatives per HPI.  Past Medical History:  Diagnosis Date   Anemia    Atrial fibrillation (HCC)    Atrial flutter (HCC)    Fibrocystic breast disease    GERD (gastroesophageal reflux disease)    History of chicken pox    Hypercholesterolemia    Hypertension     Past Surgical History:  Procedure Laterality Date   BREAST BIOPSY Right 2014   NEG   BREAST LUMPECTOMY     BREAST LUMPECTOMY WITH SENTINEL LYMPH NODE BIOPSY Left 06/04/2020   Procedure: BREAST LUMPECTOMY WITH SENTINEL LYMPH NODE  BX;  Surgeon: Robert Bellow, MD;  Location: [facility] ORS;  Service: General;  Laterality: Left;   CATARACT EXTRACTION     DILATION AND CURETTAGE OF UTERUS     Laser repair of torn retina      Family History  Problem Relation Age of Onset   Heart disease Mother 77       Heart attack   Cancer Father 67       lung cancer   Breast cancer Paternal Aunt        x3   Colon cancer Neg Hx     SOCIAL HX: reviewed.    Current Outpatient Medications:    acetaminophen (TYLENOL) 650 MG CR tablet, Take 650 mg by mouth every 8 (eight) hours as needed for pain., Disp: , Rfl:    Albuterol Sulfate (PROAIR RESPICLICK) 254 (90 Base) MCG/ACT AEPB, Inhale 2 puffs into the lungs every 6 (six) hours as needed., Disp: 1 each, Rfl: 2   Ascorbic Acid (VITAMIN C) 250 MG CHEW, Chew 250 mg by mouth daily. , Disp: , Rfl:    benzonatate (TESSALON) 100 MG capsule, Take 1 capsule (100 mg total) by mouth 3 (three) times daily as needed for cough., Disp: 20 capsule, Rfl: 0   calcium citrate-vitamin D 500-500 MG-UNIT chewable tablet, Chew 3 tablets by mouth daily., Disp: , Rfl:    famotidine (PEPCID) 20 MG tablet, Take 20 mg by mouth daily., Disp: , Rfl:  letrozole (FEMARA) 2.5 MG tablet, Take 2.5 mg by mouth daily., Disp: , Rfl:    Multiple Vitamin (MULTIVITAMIN) tablet, Take 1 tablet by mouth daily., Disp: , Rfl:    nitrofurantoin, macrocrystal-monohydrate, (MACROBID) 100 MG capsule, Take 1 capsule (100 mg total) by mouth 2 (two) times daily., Disp: 10 capsule, Rfl: 0   propranolol (INDERAL) 10 MG tablet, Take 1 tablet (10 mg total) by mouth 3 (three) times daily as needed (heart rate). Take one tab everyday,  may take extra for breakthrough elevated heart rate, Disp: 270 tablet, Rfl: 3   sertraline (ZOLOFT) 50 MG tablet, TAKE 1 TABLET BY MOUTH AT BEDTIME, Disp: 30 tablet, Rfl: 2   amLODipine (NORVASC) 5 MG tablet, TAKE 1 TABLET BY MOUTH ONCE DAILY AT DINNER TIME, Disp: 30 tablet, Rfl: 10   apixaban (ELIQUIS) 2.5 MG  TABS tablet, TAKE 1 TABLET BY MOUTH TWICE DAILY, Disp: 180 tablet, Rfl: 0   losartan (COZAAR) 100 MG tablet, TAKE 1 TABLET BY MOUTH ONCE DAILY, Disp: 90 tablet, Rfl: 1  EXAM:  GENERAL: alert. Sounds to be in no acute distress.  Answering questions appropriately.    PSYCH/NEURO: pleasant and cooperative, no obvious depression or anxiety, speech and thought processing grossly intact  ASSESSMENT AND PLAN:  Discussed the following assessment and plan:  Problem List Items Addressed This Visit     Atrial fibrillation ([place])    No chest pain.  Breathing stable  No increased heart rate reported.  Follow.        Dysuria    POCT urine - appears to be c/w UTI.  Symptoms c/w UTI.  Treat with macrobid.  Await urine culture results.  Stay hydrated.  Follow up urine culture.  Any change or worsening symptoms, she is to be evaluated.        Essential hypertension    Continue amlodipine and losartan.  Blood pressure as outlined.  Follow pressures.  Follow metabolic panel.        Thrombophilia ([place])    afib on eliquis.         Return if symptoms worsen or fail to improve.   I discussed the assessment and treatment plan with the patient. The patient was provided an opportunity to ask questions and all were answered. The patient agreed with the plan and demonstrated an understanding of the instructions.   The patient was advised to call back or seek an in-person evaluation if the symptoms worsen or if the condition fails to improve as anticipated.  I provided 22 minutes of non-face-to-face time during this encounter.   Einar Pheasant, MD

## 2022-02-22 NOTE — Telephone Encounter (Signed)
Patient states she just saw Dr. Einar Pheasant this past Friday (02/19/2022) and that night she started feeling like something wasn't right.  Patient states she thinks she has a UTI.  Patient states she had a temperature of 101 last night.  Patient states she hasn't taken her temperature this morning but she doesn't feel like she has a fever.  Patient states since she just saw Dr. Nicki Reaper on Friday, she would like to know if she can come by and give a specimen without being seen again.  Please call.

## 2022-02-22 NOTE — Telephone Encounter (Signed)
S/w pt daughter - will bring pt to office today for urine. Scheduled for 4:30pm virtual tomorrow. Responded to pt mychart msg advising same.

## 2022-02-22 NOTE — Telephone Encounter (Signed)
See if can come in today and drop off sample and add on virtual visit tomorrow.

## 2022-02-23 ENCOUNTER — Telehealth: Payer: Medicare Other | Admitting: Internal Medicine

## 2022-02-24 ENCOUNTER — Encounter: Payer: Self-pay | Admitting: Internal Medicine

## 2022-02-24 LAB — URINE CULTURE
MICRO NUMBER:: 13427480
SPECIMEN QUALITY:: ADEQUATE

## 2022-02-25 ENCOUNTER — Telehealth: Payer: Self-pay

## 2022-02-25 ENCOUNTER — Other Ambulatory Visit: Payer: Self-pay | Admitting: Internal Medicine

## 2022-02-25 DIAGNOSIS — R319 Hematuria, unspecified: Secondary | ICD-10-CM

## 2022-02-25 NOTE — Telephone Encounter (Signed)
See phone note, pt was sent to ER

## 2022-02-25 NOTE — Progress Notes (Signed)
Order placed for f/u urinalysis 

## 2022-02-25 NOTE — Telephone Encounter (Signed)
Given persistent fever despite abx and given low blood pressure, not able to stand, etc - needs to be evaluated.

## 2022-02-26 ENCOUNTER — Other Ambulatory Visit: Payer: Self-pay | Admitting: Cardiovascular Disease

## 2022-02-26 ENCOUNTER — Other Ambulatory Visit: Payer: Self-pay | Admitting: Internal Medicine

## 2022-02-26 NOTE — Telephone Encounter (Signed)
Prescription refill request for Eliquis received. Indication: afib  Last office visit: [place], 03/05/2021 Scr: 0.94, 12/15/2021 Age: 88 yo  Weight: 61.2 kg    Pt is due to see cardiologist soon. Will send msg to schedulers. Pt qualifies for a dose change.   Spoke with [employer] [facility], Will keep pt on same dose for now.   Refill sent.

## 2022-03-01 NOTE — Assessment & Plan Note (Signed)
Continue amlodipine and losartan.  Blood pressure as outlined.  Follow pressures.  Follow metabolic panel.  

## 2022-03-01 NOTE — Assessment & Plan Note (Signed)
afib on eliquis.  

## 2022-03-01 NOTE — Assessment & Plan Note (Signed)
POCT urine - appears to be c/w UTI.  Symptoms c/w UTI.  Treat with macrobid.  Await urine culture results.  Stay hydrated.  Follow up urine culture.  Any change or worsening symptoms, she is to be evaluated.

## 2022-03-01 NOTE — Assessment & Plan Note (Signed)
No chest pain.  Breathing stable  No increased heart rate reported.  Follow.

## 2022-03-02 ENCOUNTER — Telehealth: Payer: Self-pay | Admitting: Internal Medicine

## 2022-03-02 NOTE — Telephone Encounter (Signed)
Copied from [place]. Topic: Medicare AWV >> Mar 02, 2022 11:29 AM Harris-Coley, Hannah Beat wrote: Reason for CRM: Left message for patient to schedule Annual Wellness Visit.  Please schedule with Nurse Health Advisor Denisa O'Brien-Blaney, LPN at [facility].  Please call [phone] ask for [facility]

## 2022-03-23 IMAGING — MG MM DIGITAL DIAGNOSTIC UNILAT*L* W/ TOMO W/ CAD
8 series · 8 of 24 positions shown · non-contrast
Comparison: Prior films

CLINICAL DATA: Callback from screening mammogram for possible mass
and possible asymmetry left breast

EXAM:
DIGITAL DIAGNOSTIC left MAMMOGRAM WITH CAD AND TOMO
ULTRASOUND left BREAST

[L MLO synth-2D (1 of 2)]
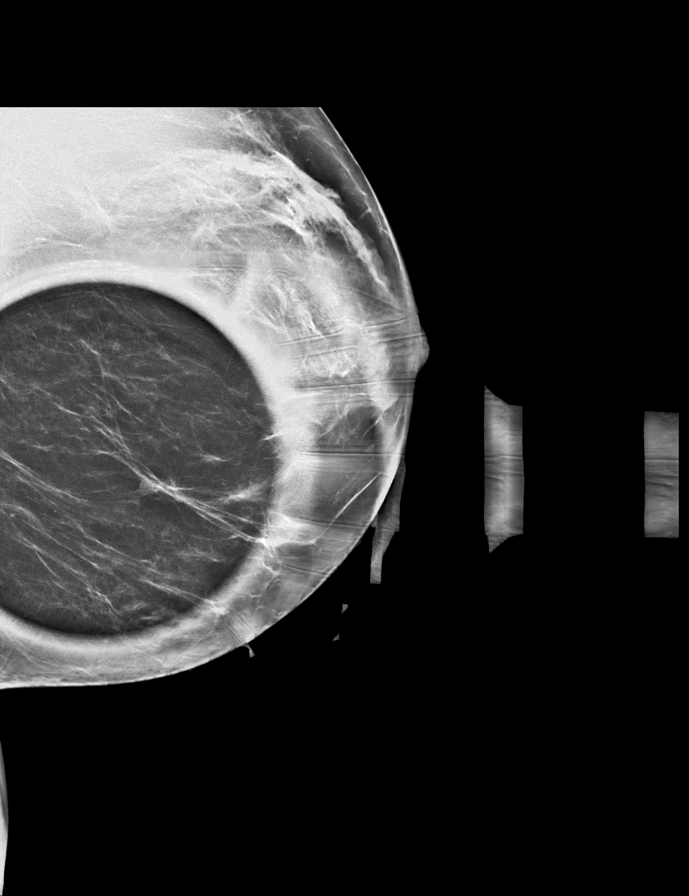

[L MLO synth-2D (2 of 2)]
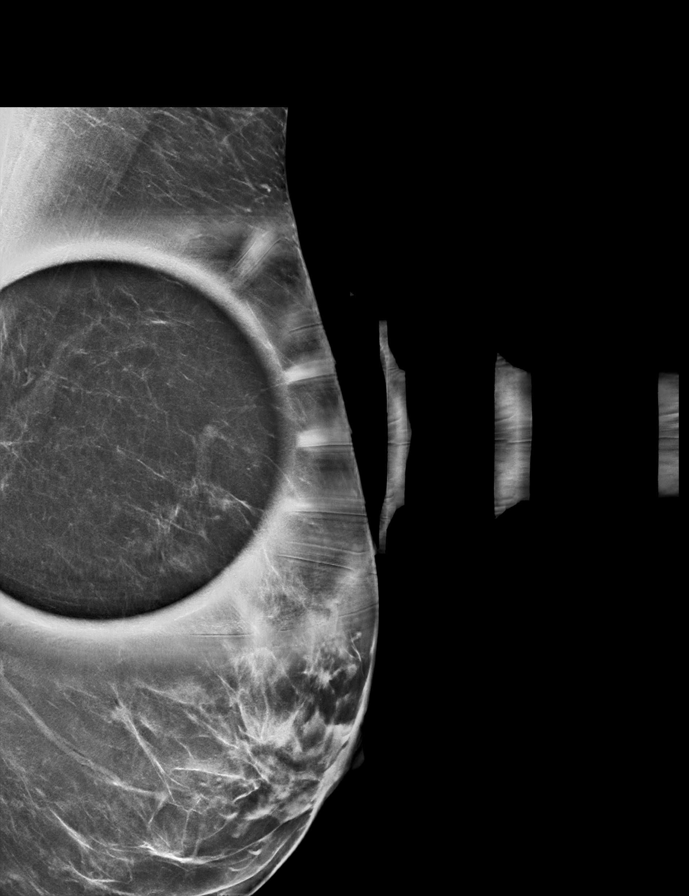

[L ML synth-2D]
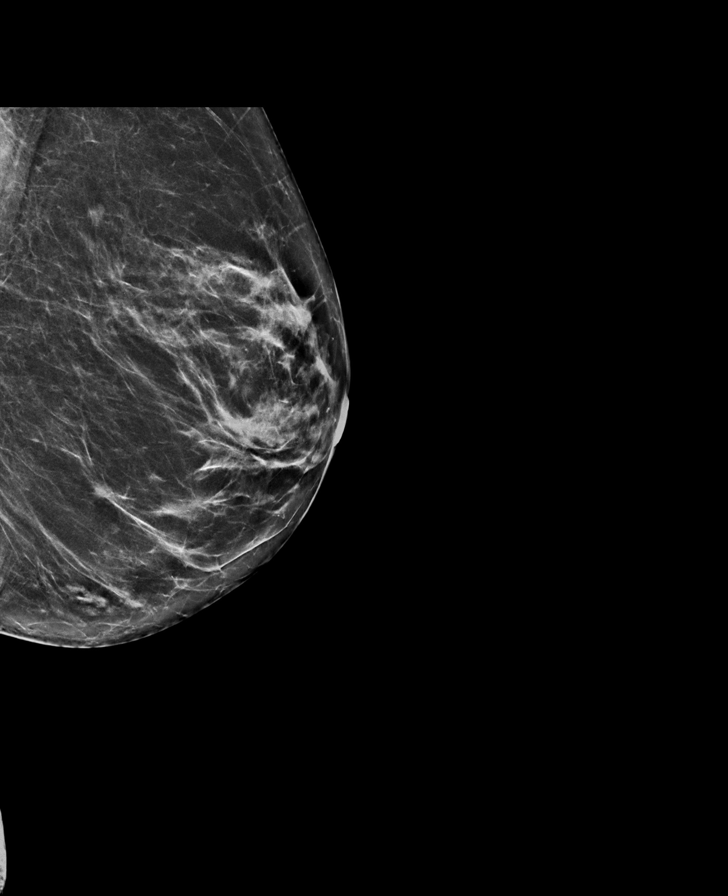

[L CC synth-2D]
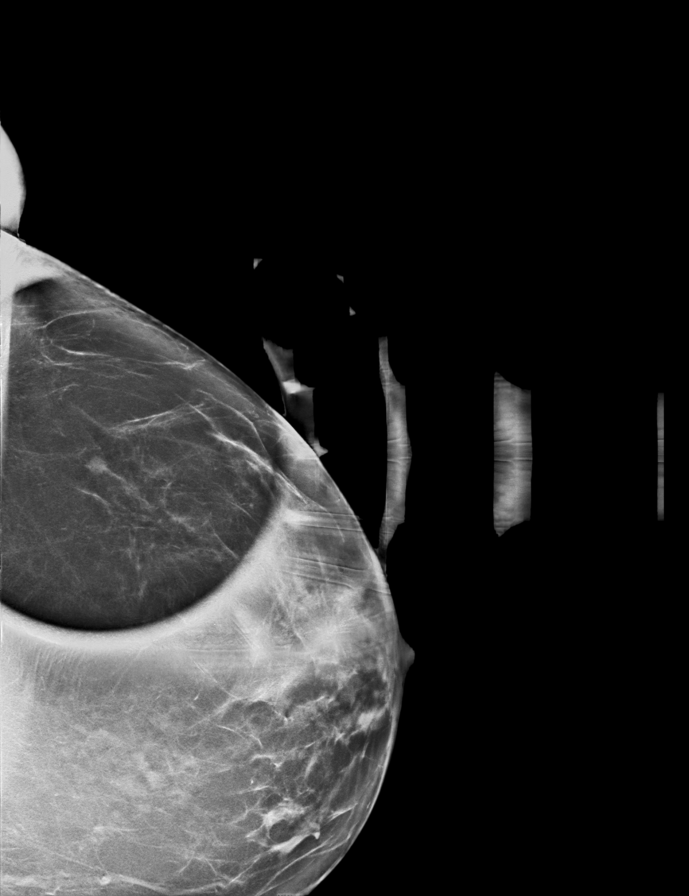

[L MLO tomo (1 of 2) · tomo slice 35/70.0]
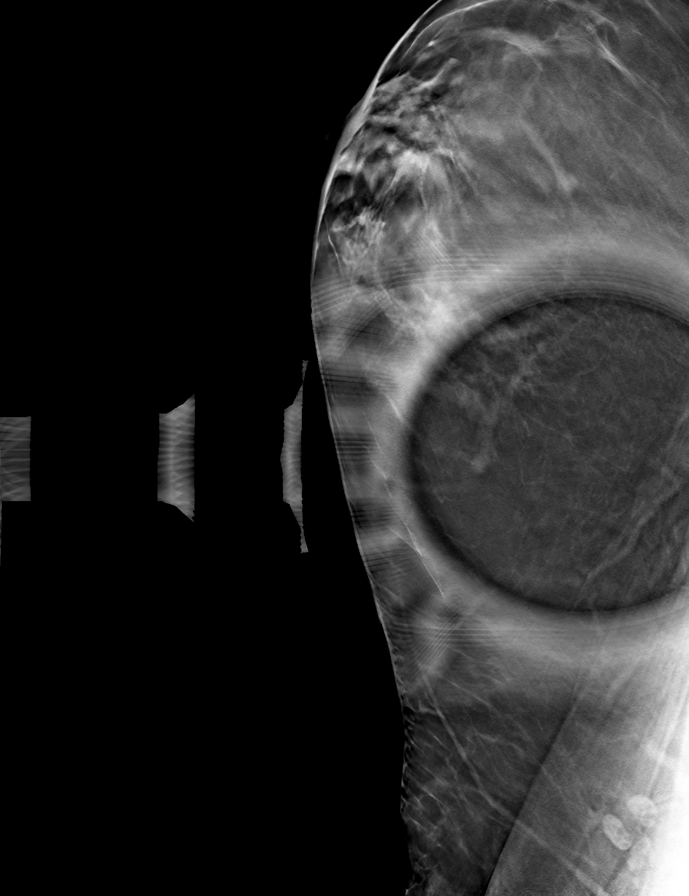

[L ML tomo · tomo slice 35/70.0]
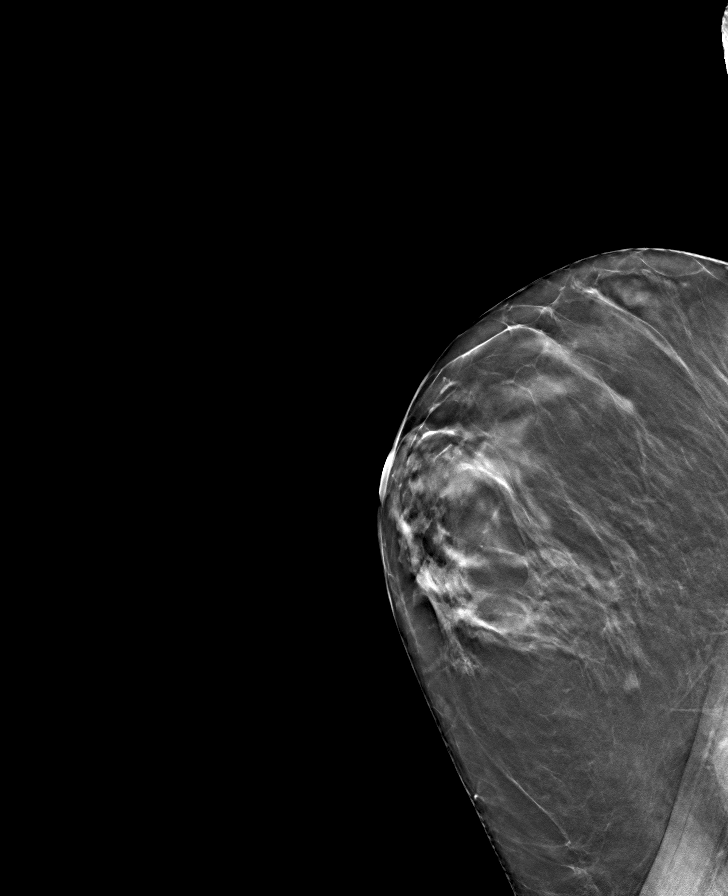

[L CC tomo · tomo slice 36/71.0]
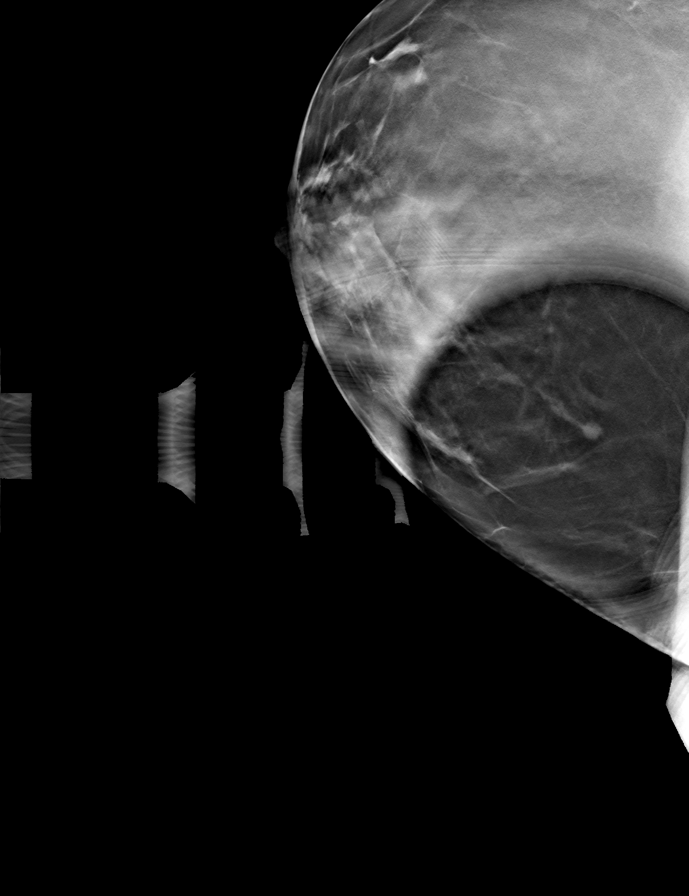

[L MLO tomo (2 of 2) · tomo slice 31/60.0]
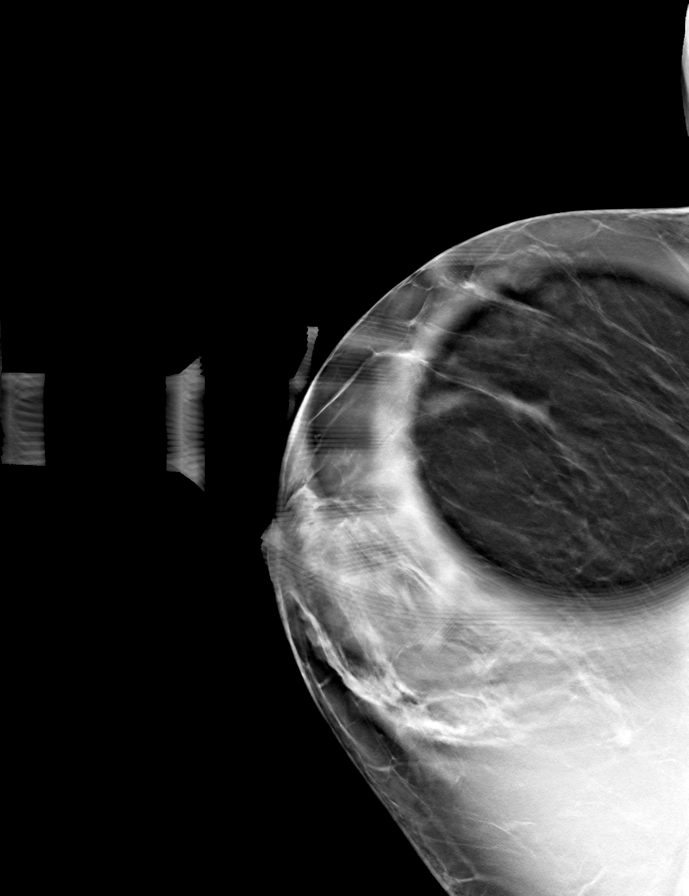

[8 of 24 positions shown; findings below may reference images not displayed]

ACR Breast Density Category b: There are scattered areas of
fibroglandular density.
FINDINGS: Spot compression cc and MLO views the left breast, lateral view the
left breast are submitted. Previously questioned mass in the
posterior upper-outer quadrant left breast persists on additional
views. Previously questioned asymmetry in the lower left breast
persists on additional views.

Mammographic images were processed with CAD.

Targeted ultrasound is performed, showing 5 x 4 x 5 mm irregular
hypoechoic lesion with hyperechoic rim at the left breast 4 o'clock
cm from nipple correlating to the asymmetry seen mammogram. There is
6 mm normal intramammary lymph node at the left 2 o'clock 12 cm from
nipple correlating to the. Ultrasound of the left axilla is
negative.
IMPRESSION: Suspicious findings.

RECOMMENDATION:
Ultrasound-guided core biopsy left breast 4 o'clock mass.

I have discussed the findings and recommendations with the patient.
If applicable, a reminder letter will be sent to the patient
regarding the next appointment.

BI-RADS CATEGORY  4: Suspicious.

## 2022-03-29 ENCOUNTER — Other Ambulatory Visit: Payer: Self-pay | Admitting: General Surgery

## 2022-03-29 DIAGNOSIS — C50919 Malignant neoplasm of unspecified site of unspecified female breast: Secondary | ICD-10-CM

## 2022-04-09 ENCOUNTER — Ambulatory Visit (INDEPENDENT_AMBULATORY_CARE_PROVIDER_SITE_OTHER): Payer: Medicare Other | Admitting: Medical

## 2022-04-09 ENCOUNTER — Encounter: Payer: Self-pay | Admitting: Medical

## 2022-04-09 VITALS — BP 142/70 | HR 106 | Ht 66.0 in | Wt 130.2 lb

## 2022-04-09 DIAGNOSIS — R0609 Other forms of dyspnea: Secondary | ICD-10-CM

## 2022-04-09 DIAGNOSIS — I10 Essential (primary) hypertension: Secondary | ICD-10-CM | POA: Diagnosis not present

## 2022-04-09 DIAGNOSIS — I4891 Unspecified atrial fibrillation: Secondary | ICD-10-CM

## 2022-04-09 NOTE — Progress Notes (Signed)
Cardiology Office Note:    Date:  04/09/2022   ID:  Anita Benjamin, DOB [birth year], MRN [id number]  PCP:  Anita [place], MD  [facility] Cardiologist:  Anita Nordmann, MD  [facility] Electrophysiologist:  None   Referring MD: Anita , MD   Chief Complaint: 12 month follow-up  History of Present Illness:    Anita Benjamin is a 88 y.o. female with a hx of tachycardia, SVT, aflutter, hypotension, breast cancer s/p lumpectomy who presents for follow-up.   the patient has a greater than 10-year history of tachyarrhythmia/palpitations.  Prior event monitor from 07/2014 for palpitations showed normal sinus rhythm with sinus tachycardia and an episode of SVT.  Patient was seen in the office in 11/2014 for tachypalpitations and noted to be an atrial flutter with RVR at that time.  Echo in 12/2014 showed an EF of 60 to 65%, normal wall motion, normal LV diastolic function, mild aortic valve insufficiency, normal size left atrium, RVSF normal, PASP normal.  No prior ischemic evaluations.  Over the years, she has been managed with metoprolol and amiodarone with good control of her arrhythmia with subsequent discontinuation of metoprolol secondary to bradycardia with recommendation to take metoprolol as needed for breakthrough tachypalpitations.  Patient was seen by PCP in 08/2018 for routine follow-up of URI with EKG demonstrating atrial flutter with RVR, 120 bpm, incomplete RBBB.  Given she was adequately anticoagulated she was advised to reload with amiodarone.  Labs at that time were unrevealing including normal TSH and potassium of 4.6.  She was seen in follow-up by cardiology in 08/2018 and had a converted to sinus rhythm with a mildly bradycardic heart rate of 59 bpm.  She was most recently seen in the office in 12/2018 and was maintaining sinus rhythm on amiodarone.  She was seen 06/2019 for syncope. Echo and carotid US were ordered. Echo showed LVEF 60-65%, impaired relaxation, no LVH noted.  Carotid US is normal.  Last seen 03/05/21 and was overall stable from a cardiac perspective.   Today, the patient reports DOE. It occurs when she is sweeping or on severe exertion. She has an inhaler to use as needed. No LLE, orthopnea, or pnd. We discussed repeat echo, but she would like to wait at this time. BP is a little high today, but BP is better at home. She takes propranolol once daily. EKG shows sinus bradycardia.   Past Medical History:  Diagnosis Date   Anemia    Atrial fibrillation (HCC)    Atrial flutter (HCC)    Fibrocystic breast disease    GERD (gastroesophageal reflux disease)    History of chicken pox    Hypercholesterolemia    Hypertension     Past Surgical History:  Procedure Laterality Date   BREAST BIOPSY Right 2014   NEG   BREAST LUMPECTOMY     BREAST LUMPECTOMY WITH SENTINEL LYMPH NODE BIOPSY Left 06/04/2020   Procedure: BREAST LUMPECTOMY WITH SENTINEL LYMPH NODE BX;  Surgeon: Earline Mayotte, MD;  Location: [facility] ORS;  Service: General;  Laterality: Left;   CATARACT EXTRACTION     DILATION AND CURETTAGE OF UTERUS     Laser repair of torn retina      Current Medications: Current Meds  Medication Sig   acetaminophen (TYLENOL) 650 MG CR tablet Take 650 mg by mouth every 8 (eight) hours as needed for pain.   Albuterol Sulfate (PROAIR RESPICLICK) 108 (90 Base) MCG/ACT AEPB Inhale 2 puffs into the lungs every 6 (six) hours as needed.  amLODipine (NORVASC) 5 MG tablet TAKE 1 TABLET BY MOUTH ONCE DAILY AT DINNER TIME   apixaban (ELIQUIS) 2.5 MG TABS tablet TAKE 1 TABLET BY MOUTH TWICE DAILY   Ascorbic Acid (VITAMIN C) 250 MG CHEW Chew 250 mg by mouth daily.    calcium citrate-vitamin D 500-500 MG-UNIT chewable tablet Chew 3 tablets by mouth daily.   letrozole (FEMARA) 2.5 MG tablet Take 2.5 mg by mouth daily.   losartan (COZAAR) 100 MG tablet TAKE 1 TABLET BY MOUTH ONCE DAILY   Multiple Vitamin (MULTIVITAMIN) tablet Take 1 tablet by mouth daily.    propranolol (INDERAL) 10 MG tablet Take 1 tablet (10 mg total) by mouth 3 (three) times daily as needed (heart rate). Take one tab everyday,  may take extra for breakthrough elevated heart rate   sertraline (ZOLOFT) 50 MG tablet TAKE 1 TABLET BY MOUTH AT BEDTIME     Allergies:   Oruvail [ketoprofen], Relafen [nabumetone], and Timentin [ticarcillin-pot clavulanate]   Social History   Socioeconomic History   Marital status: Widowed    Spouse name: Not on file   Number of children: 2   Years of education: Not on file   Highest education level: Not on file  Occupational History   Not on file  Tobacco Use   Smoking status: Never   Smokeless tobacco: Never  Vaping Use   Vaping Use: Never used  Substance and Sexual Activity   Alcohol use: No    Alcohol/week: 0.0 standard drinks of alcohol   Drug use: No   Sexual activity: Not on file  Other Topics Concern   Not on file  Social History Narrative   Not on file   Social Determinants of Health   Financial Resource Strain: Low Risk  (02/13/2018)   Overall Financial Resource Strain (CARDIA)    Difficulty of Paying Living Expenses: Not hard at all  Food Insecurity: No Food Insecurity (02/13/2018)   Hunger Vital Sign    Worried About Running Out of Food in the Last Year: Never true    Ran Out of Food in the Last Year: Never true  Transportation Needs: No Transportation Needs (02/13/2018)   PRAPARE - Administrator, Civil Service (Medical): No    Lack of Transportation (Non-Medical): No  Physical Activity: Unknown (02/13/2018)   Exercise Vital Sign    Days of Exercise per Week: 0 days    Minutes of Exercise per Session: Not on file  Stress: No Stress Concern Present (02/13/2018)   [employer] of Occupational Health - Occupational Stress Questionnaire    Feeling of Stress : Not at all  Social Connections: Not on file     Family History: The patient's family history includes Breast cancer in her paternal aunt; Cancer  (age of onset: 92) in her father; Heart disease (age of onset: 15) in her mother. There is no history of Colon cancer.  ROS:   Please see the history of present illness.     All other systems reviewed and are negative.  EKGs/Labs/Other Studies Reviewed:    The following studies were reviewed today:  Echo 2020   1. Left ventricular ejection fraction, by visual estimation, is 60 to  65%. The left ventricle has normal function. Normal left ventricular size.  There is no left ventricular hypertrophy.   2. Left ventricular diastolic Doppler parameters are consistent with  impaired relaxation pattern of LV diastolic filling.   3. Global right ventricle has normal systolic function.The right  ventricular size  is normal. No increase in right ventricular wall  thickness.   4. Left atrial size was normal.   5. Mild mitral valve regurgitation.   6. Tricuspid valve regurgitation is mild.   7. Mildly elevated pulmonary artery systolic pressure.   EKG:  EKG is  ordered today.  The ekg ordered today demonstrates SB HR low 50s, no significant changes  Recent Labs: 12/15/2021: ALT 12; BUN 18; Creatinine, Ser 0.94; Hemoglobin 12.9; Platelets 343.0; Potassium 4.9; Sodium 138  Recent Lipid Panel    Component Value Date/Time   CHOL 185 12/15/2021 1158   TRIG 164.0 (H) 12/15/2021 1158   HDL 58.40 12/15/2021 1158   CHOLHDL 3 12/15/2021 1158   VLDL 32.8 12/15/2021 1158   LDLCALC 94 12/15/2021 1158   LDLDIRECT 129.0 04/03/2021 1101       Physical Exam:    VS:  BP (!) 142/70 (BP Location: Right Arm, Patient Position: Sitting, Cuff Size: Normal)   Pulse (!) 106   Ht 5\' 6"  (1.676 m)   Wt 130 lb 3.2 oz (59.1 kg)   LMP 10/27/1989   SpO2 95%   BMI 21.01 kg/m     Wt Readings from Last 3 Encounters:  04/09/22 130 lb 3.2 oz (59.1 kg)  02/22/22 135 lb (61.2 kg)  02/19/22 135 lb 12.8 oz (61.6 kg)     GEN:  Well nourished, well developed in no acute distress HEENT: Normal NECK: No JVD; No  carotid bruits LYMPHATICS: No lymphadenopathy CARDIAC: RR, bradycardic, no murmurs, rubs, gallops RESPIRATORY:  Clear to auscultation without rales, wheezing or rhonchi  ABDOMEN: Soft, non-tender, non-distended MUSCULOSKELETAL:  No edema; No deformity  SKIN: Warm and dry NEUROLOGIC:  Alert and oriented x 3 PSYCHIATRIC:  Normal affect   ASSESSMENT:    1. Dyspnea on exertion   2. Atrial fibrillation, unspecified type (HCC)   3. Essential hypertension    PLAN:    In order of problems listed above:  DOE She reports DOE. Prior echo showed normal LVEF and impaired relaxation with mild MR and mild TR. She also saw pulmonology who gave her an inhaler. We discussed repeat echo but she would like to wait until after her follow-up with pulmonology. She has never had an ischemic evaluation, can consider at follow-up.   Aflutter She is in sinus bradycardia today. Continue stroke ppx with Eliquis 2.5mg BID. Continue rate control with propranolol 10mg  daily.   HTN BP is mildly elevated today, but she says it's better at home. Continue current medications.    Disposition: Follow up in 3 month(s) with MD/APP     Signed, Amayah Staheli David Stall, PA-C  04/09/2022 10:50 AM    [facility]

## 2022-04-09 NOTE — Patient Instructions (Signed)
Medication Instructions:  Your physician recommends that you continue on your current medications as directed. Please refer to the Current Medication list given to you today.  *If you need a refill on your cardiac medications before your next appointment, please call your pharmacy*   Lab Work: None ordered If you have labs (blood work) drawn today and your tests are completely normal, you will receive your results only by: [place] (if you have MyChart) OR A paper copy in the mail If you have any lab test that is abnormal or we need to change your treatment, we will call you to review the results.   Testing/Procedures: None ordered   Follow-Up: At [facility], you and your health needs are our priority.  As part of our continuing mission to provide you with exceptional heart care, we have created designated Provider Care Teams.  These Care Teams include your primary Cardiologist (physician) and Advanced Practice Providers (APPs -  Physician Assistants and Nurse Practitioners) who all work together to provide you with the care you need, when you need it.  We recommend signing up for the patient portal called "MyChart".  Sign up information is provided on this After Visit Summary.  MyChart is used to connect with patients for Virtual Visits (Telemedicine).  Patients are able to view lab/test results, encounter notes, upcoming appointments, etc.  Non-urgent messages can be sent to your provider as well.   To learn more about what you can do with MyChart, go to [email or web address].    Your next appointment:   3 month(s)  The format for your next appointment:   In Person  Provider:   You may see Ida Rogue, MD or one of the following Advanced Practice Providers on your designated Care Team:   Murray Hodgkins, NP Christell Faith, PA-C Cadence Kathlen Mody, [place]  Other Instructions N/A  Important Information About Sugar

## 2022-04-12 ENCOUNTER — Ambulatory Visit (INDEPENDENT_AMBULATORY_CARE_PROVIDER_SITE_OTHER): Payer: Medicare Other | Admitting: Pulmonary Disease

## 2022-04-12 ENCOUNTER — Encounter: Payer: Self-pay | Admitting: Pulmonary Disease

## 2022-04-12 ENCOUNTER — Encounter: Payer: Self-pay | Admitting: Internal Medicine

## 2022-04-12 VITALS — BP 120/64 | HR 64 | Temp 97.9°F | Ht 66.0 in | Wt 130.6 lb

## 2022-04-12 DIAGNOSIS — R053 Chronic cough: Secondary | ICD-10-CM

## 2022-04-12 DIAGNOSIS — J4540 Moderate persistent asthma, uncomplicated: Secondary | ICD-10-CM | POA: Diagnosis not present

## 2022-04-12 DIAGNOSIS — R0602 Shortness of breath: Secondary | ICD-10-CM | POA: Diagnosis not present

## 2022-04-12 DIAGNOSIS — J45909 Unspecified asthma, uncomplicated: Secondary | ICD-10-CM | POA: Insufficient documentation

## 2022-04-12 MED ORDER — TRELEGY ELLIPTA 100-62.5-25 MCG/ACT IN AEPB: 1.0000 | INHALATION_SPRAY | Freq: Every day | RESPIRATORY_TRACT | 0 refills | Status: DC

## 2022-04-12 NOTE — Patient Instructions (Signed)
We are giving you a trial of an inhaler called Trelegy.  This is 1 puff daily.  Make sure you rinse your mouth well after you use it.  Let us know how you do with this inhaler.  The other inhaler that you have (ProAir) is an as needed inhaler only.  You may use it up to 4 times a day if you notice excessive cough, wheezing or shortness of breath.  We will see you in follow-up in 3 to 4 months time call sooner should any new problems arise.

## 2022-04-12 NOTE — Progress Notes (Signed)
Subjective:    Patient ID: Anita Benjamin, female    DOB: [birth year], 88 y.o.   MRN: [id number] Patient Care Team: Einar Pheasant, MD as PCP - General (Internal Medicine) Rockey Situ Kathlene November, MD as PCP - Cardiology (Cardiology) Bary Castilla Forest Gleason, MD as Consulting Physician (General Surgery) Minna Merritts, MD as Consulting Physician (Cardiology) Noreene Filbert, MD as Referring Physician (Radiation Oncology)  Chief Complaint  Patient presents with   Follow-up    Occ prod cough with yellow to green sputum and occ SOB with exertion     HPI Patient is an 88 year old lifelong never smoker, albeit with secondhand smoke exposure) who presents for follow-up on the issue of cough and shortness of breath.  She was last seen here on 08 January 2022.  At that time she was given a trial of albuterol to see if this would alleviate her symptoms.  She has been reluctant to try any inhalers.  She has changes consistent with persistent asthma.  She has been reluctant to try inhalers because she states that her husband who had COPD was "addicted to them".  I have explained to her that her symptoms of cough and shortness of breath are likely due to poorly compensated asthma.  I have urged her to try the inhalers as prescribed.  Of note the patient is also on letrozole for breast cancer therapy and this also can cause cough.  She does not endorse any fevers, chills or sweats.  No chest pain, no orthopnea, paroxysmal nocturnal dyspnea or lower extremity edema.  No calf tenderness.  Shortness of breath occurs mostly during exertion.  No other symptomatology endorsed.    DATA 11/20/2021 PFTs: FEV1 1.6 L or 64% predicted, FVC 2.26 L or 85% of predicted, FEV1/FVC 56%, there is significant bronchodilator response with a net change of 14% and FEV1.  Lung volumes were reduced however the test was not reproducible for lung volumes or diffusion capacity.  On the effort independent portion of the curve small airways  component is noted.  Diffusion capacity mildly reduced not corrected for hemoglobin.  Overall patient has changes consistent with asthma.  Review of Systems A 10 point review of systems was performed and it is as noted above otherwise negative.  Patient Active Problem List   Diagnosis Date Noted   Hyperglycemia 02/20/2022   Sinusitis, acute, maxillary 01/04/2022   Hypercalcemia 01/04/2022   Right leg swelling 12/16/2021   Foot pain, right 12/15/2021   Right ankle pain 12/15/2021   Thrombophilia ([place]) 09/12/2021   Decreased GFR 09/11/2021   Hemochromatosis, hereditary ([place]) 04/15/2021   Hemochromatosis carrier 04/05/2021   Aortic atherosclerosis ([place]) 04/05/2021   Headache 04/03/2021   Atrial fibrillation ([place]) 01/15/2021   Anemia 01/12/2021   Atrial fibrillation with RVR ([place]) 01/01/2021   Hyponatremia 01/01/2021   CAP (community acquired pneumonia) 12/25/2020   Breast cancer ([place]) 06/09/2020   Diarrhea 04/11/2019   Hair loss 06/10/2017   Fatigue 02/19/2017   Chest pain 02/12/2017   Encounter for anticoagulation discussion and counseling 10/05/2016   Cough 10/01/2016   Gas 10/27/2015   Dysuria 09/17/2015   Left hip pain 07/21/2015   Neck pain 06/09/2015   Syncope 03/25/2015   Left wrist pain 03/25/2015   SOB (shortness of breath) 12/01/2014   Abdominal discomfort 12/01/2014   Atrial flutter ([place]) 11/15/2014   Stress 11/08/2014   Difficulty sleeping 11/08/2014   Health care maintenance 11/08/2014   Adjustment disorder 10/14/2014   Tachycardia 07/17/2014   Hyperkalemia 07/17/2014  History of colonic polyps 03/09/2014   Decreased sense of taste 03/09/2014   Abnormal mammogram, unspecified 07/31/2013   Abnormal mammogram 01/20/2013   Essential hypertension 10/29/2012   Hypercholesterolemia 10/29/2012   Leukopenia 10/29/2012   Social History   Tobacco Use   Smoking status: Never   Smokeless tobacco: Never  Substance Use Topics   Alcohol use: No     Alcohol/week: 0.0 standard drinks of alcohol   Allergies  Allergen Reactions   Oruvail [Ketoprofen]    Relafen [Nabumetone]    Timentin [Ticarcillin-Pot Clavulanate]    Current Meds  Medication Sig   acetaminophen (TYLENOL) 650 MG CR tablet Take 650 mg by mouth every 8 (eight) hours as needed for pain.   amLODipine (NORVASC) 5 MG tablet TAKE 1 TABLET BY MOUTH ONCE DAILY AT DINNER TIME   apixaban (ELIQUIS) 2.5 MG TABS tablet TAKE 1 TABLET BY MOUTH TWICE DAILY   Ascorbic Acid (VITAMIN C) 250 MG CHEW Chew 250 mg by mouth daily.    benzonatate (TESSALON) 100 MG capsule Take 1 capsule (100 mg total) by mouth 3 (three) times daily as needed for cough.   calcium citrate-vitamin D 500-500 MG-UNIT chewable tablet Chew 3 tablets by mouth daily.   letrozole (FEMARA) 2.5 MG tablet Take 2.5 mg by mouth daily.   losartan (COZAAR) 100 MG tablet TAKE 1 TABLET BY MOUTH ONCE DAILY   Multiple Vitamin (MULTIVITAMIN) tablet Take 1 tablet by mouth daily.   nitrofurantoin, macrocrystal-monohydrate, (MACROBID) 100 MG capsule Take 1 capsule (100 mg total) by mouth 2 (two) times daily.   propranolol (INDERAL) 10 MG tablet Take 1 tablet (10 mg total) by mouth 3 (three) times daily as needed (heart rate). Take one tab everyday,  may take extra for breakthrough elevated heart rate   sertraline (ZOLOFT) 50 MG tablet TAKE 1 TABLET BY MOUTH AT BEDTIME   [DISCONTINUED] famotidine (PEPCID) 20 MG tablet Take 20 mg by mouth daily.   Immunization History  Administered Date(s) Administered   Fluad Quad(high Dose 65+) 06/22/2019, 07/30/2020, 06/12/2021   Influenza, High Dose Seasonal PF 06/10/2017, 09/14/2018   Influenza,inj,Quad PF,6+ Mos 06/27/2013, 07/04/2014, 06/05/2015, 05/27/2016   PFIZER(Purple Top)SARS-COV-2 Vaccination 10/31/2019, 11/21/2019   Pneumococcal Conjugate-13 10/21/2015   Pneumococcal Polysaccharide-23 10/06/2017   Tdap 10/27/2012       Objective:   Physical Exam BP 120/64 (BP Location: Right Arm,  Cuff Size: Normal)   Pulse 64   Temp 97.9 F (36.6 C) (Temporal)   Ht '5\' 6"'$  (1.676 m)   Wt 130 lb 9.6 oz (59.2 kg)   LMP 10/27/1989   SpO2 96%   BMI 21.08 kg/m  GENERAL: Thin woman, no acute distress, does sound nasally congested, fully ambulatory.  No conversational dyspnea. HEAD: Normocephalic, atraumatic.  EYES: Pupils equal, round, reactive to light.  No scleral icterus.  MOUTH: Nose/mouth/throat not examined due to masking requirements for COVID 19. NECK: Supple. No thyromegaly. Trachea midline. No JVD.  No adenopathy. PULMONARY: Good air entry bilaterally.  No adventitious sounds. CARDIOVASCULAR: S1 and S2. Regular rate and rhythm.  No rubs, murmurs or gallops heard. ABDOMEN: Benign. MUSCULOSKELETAL: No joint deformity, no clubbing, no edema.  NEUROLOGIC: No focal deficit, no gait disturbance, speech is fluent. SKIN: Intact,warm,dry. PSYCH: Mood and behavior normal.    Assessment & Plan:     ICD-10-CM   1. Moderate persistent asthma without complication  N82.95    Symptoms of cough and shortness of breath may be due to this Recommend trial of Trelegy Ellipta 1 puff  daily Also encouraged to use albuterol as needed    2. Chronic cough  R05.3    This may be related to poorly controlled asthma Other possibilities may be due to letrozole    3. SOB (shortness of breath)  R06.02    She was encouraged to use inhalers as prescribed     Meds ordered this encounter  Medications   Fluticasone-Umeclidin-Vilant (TRELEGY ELLIPTA) 100-62.5-25 MCG/ACT AEPB    Sig: Inhale 1 puff into the lungs daily.    Dispense:  14 each    Refill:  0    Order Specific Question:   Lot Number?    Answer:   [id number]    Order Specific Question:   Expiration Date?    Answer:   08/05/2023    Order Specific Question:   Manufacturer?    Answer:   [employer] [[id number]]    Order Specific Question:   Quantity    Answer:   2    Follow-up in 3 to 4 months time she is to contact us prior to that time should  any new difficulties arise.  Renold Don, MD Advanced Bronchoscopy P[facility]    *This note was dictated using voice recognition software/Dragon.  Despite best efforts to proofread, errors can occur which can change the meaning. Any transcriptional errors that result from this process are unintentional and may not be fully corrected at the time of dictation.

## 2022-04-14 ENCOUNTER — Other Ambulatory Visit: Payer: Self-pay | Admitting: Internal Medicine

## 2022-04-28 ENCOUNTER — Other Ambulatory Visit (INDEPENDENT_AMBULATORY_CARE_PROVIDER_SITE_OTHER): Payer: Medicare Other

## 2022-04-28 DIAGNOSIS — I10 Essential (primary) hypertension: Secondary | ICD-10-CM | POA: Diagnosis not present

## 2022-04-28 DIAGNOSIS — R739 Hyperglycemia, unspecified: Secondary | ICD-10-CM | POA: Diagnosis not present

## 2022-04-28 DIAGNOSIS — E7800 Pure hypercholesterolemia, unspecified: Secondary | ICD-10-CM

## 2022-04-28 DIAGNOSIS — R319 Hematuria, unspecified: Secondary | ICD-10-CM

## 2022-04-28 LAB — HEMOGLOBIN A1C: Hgb A1c MFr Bld: 5.7 % (ref 4.6–6.5)

## 2022-04-28 LAB — LIPID PANEL
Cholesterol: 205 mg/dL — ABNORMAL HIGH (ref 0–200)
HDL: 56.8 mg/dL (ref >39.00)
LDL Cholesterol: 117 mg/dL — ABNORMAL HIGH (ref 0–99)
NonHDL: 147.78
Total CHOL/HDL Ratio: 4
Triglycerides: 155 mg/dL — ABNORMAL HIGH (ref 0.0–149.0)
VLDL: 31 mg/dL (ref 0.0–40.0)

## 2022-04-28 LAB — HEPATIC FUNCTION PANEL
ALT: 12 U/L (ref 0–35)
AST: 19 U/L (ref 0–37)
Albumin: 4.2 g/dL (ref 3.5–5.2)
Alkaline Phosphatase: 71 U/L (ref 39–117)
Bilirubin, Direct: 0.1 mg/dL (ref 0.0–0.3)
Total Bilirubin: 0.5 mg/dL (ref 0.2–1.2)
Total Protein: 6.8 g/dL (ref 6.0–8.3)

## 2022-04-28 LAB — TSH: TSH: 3.42 u[IU]/mL (ref 0.35–5.50)

## 2022-04-28 LAB — BASIC METABOLIC PANEL
BUN: 19 mg/dL (ref 6–23)
CO2: 30 mEq/L (ref 19–32)
Calcium: 10 mg/dL (ref 8.4–10.5)
Chloride: 103 mEq/L (ref 96–112)
Creatinine, Ser: 0.96 mg/dL (ref 0.40–1.20)
GFR: 53.71 mL/min — ABNORMAL LOW (ref >60.00)
Glucose, Bld: 102 mg/dL — ABNORMAL HIGH (ref 70–99)
Potassium: 4.6 mEq/L (ref 3.5–5.1)
Sodium: 140 mEq/L (ref 135–145)

## 2022-04-28 NOTE — Addendum Note (Signed)
Addended by: Leeanne Rio on: 04/28/2022 09:55 AM   Modules accepted: Orders

## 2022-05-03 ENCOUNTER — Encounter: Payer: Self-pay | Admitting: Internal Medicine

## 2022-05-03 ENCOUNTER — Ambulatory Visit (INDEPENDENT_AMBULATORY_CARE_PROVIDER_SITE_OTHER): Payer: Medicare Other | Admitting: Internal Medicine

## 2022-05-03 VITALS — BP 120/64 | HR 64 | Temp 98.4°F | Resp 14 | Ht 66.0 in | Wt 129.8 lb

## 2022-05-03 DIAGNOSIS — I4891 Unspecified atrial fibrillation: Secondary | ICD-10-CM | POA: Diagnosis not present

## 2022-05-03 DIAGNOSIS — I700 Atherosclerosis of aorta: Secondary | ICD-10-CM

## 2022-05-03 DIAGNOSIS — Z0000 Encounter for general adult medical examination without abnormal findings: Secondary | ICD-10-CM

## 2022-05-03 DIAGNOSIS — R739 Hyperglycemia, unspecified: Secondary | ICD-10-CM

## 2022-05-03 DIAGNOSIS — D6859 Other primary thrombophilia: Secondary | ICD-10-CM

## 2022-05-03 DIAGNOSIS — E7800 Pure hypercholesterolemia, unspecified: Secondary | ICD-10-CM

## 2022-05-03 DIAGNOSIS — J4520 Mild intermittent asthma, uncomplicated: Secondary | ICD-10-CM

## 2022-05-03 DIAGNOSIS — I10 Essential (primary) hypertension: Secondary | ICD-10-CM

## 2022-05-03 DIAGNOSIS — Z148 Genetic carrier of other disease: Secondary | ICD-10-CM

## 2022-05-03 DIAGNOSIS — R944 Abnormal results of kidney function studies: Secondary | ICD-10-CM

## 2022-05-03 DIAGNOSIS — C50919 Malignant neoplasm of unspecified site of unspecified female breast: Secondary | ICD-10-CM

## 2022-05-03 NOTE — Progress Notes (Unsigned)
Patient ID: Anita Benjamin, female   DOB: [birth year], 88 y.o.   MRN: [id number]   Subjective:    Patient ID: Anita Benjamin, female    DOB: [birth year], 88 y.o.   MRN: [id number]   Patient here for her physical exam.   Chief Complaint  Patient presents with   Follow-up    Yearly CPE   .   HPI Recently evaluated by pulmonary.  Started on trelegy.  Breathing much better since starting.  Cough better.  Feels better.  No chest pain.  No increased heart rate or palpitations.  Eating.  No nausea or vomiting.  Bowels moving.  Discussed cost of medication.  See if can get pt assistance.  Discussed labs.    Past Medical History:  Diagnosis Date   Anemia    Atrial fibrillation (HCC)    Atrial flutter (HCC)    Fibrocystic breast disease    GERD (gastroesophageal reflux disease)    History of chicken pox    Hypercholesterolemia    Hypertension    Past Surgical History:  Procedure Laterality Date   BREAST BIOPSY Right 2014   NEG   BREAST LUMPECTOMY     BREAST LUMPECTOMY WITH SENTINEL LYMPH NODE BIOPSY Left 06/04/2020   Procedure: BREAST LUMPECTOMY WITH SENTINEL LYMPH NODE BX;  Surgeon: Robert Bellow, MD;  Location: [facility] ORS;  Service: General;  Laterality: Left;   CATARACT EXTRACTION     DILATION AND CURETTAGE OF UTERUS     Laser repair of torn retina     Family History  Problem Relation Age of Onset   Heart disease Mother 53       Heart attack   Cancer Father 5       lung cancer   Breast cancer Paternal Aunt        x3   Colon cancer Neg Hx    Social History   Socioeconomic History   Marital status: Widowed    Spouse name: Not on file   Number of children: 2   Years of education: Not on file   Highest education level: Not on file  Occupational History   Not on file  Tobacco Use   Smoking status: Never   Smokeless tobacco: Never  Vaping Use   Vaping Use: Never used  Substance and Sexual Activity   Alcohol use: No    Alcohol/week: 0.0 standard drinks of alcohol   Drug  use: No   Sexual activity: Not on file  Other Topics Concern   Not on file  Social History Narrative   Not on file   Social Determinants of Health   Financial Resource Strain: Low Risk  (02/13/2018)   Overall Financial Resource Strain (CARDIA)    Difficulty of Paying Living Expenses: Not hard at all  Food Insecurity: No Food Insecurity (02/13/2018)   Hunger Vital Sign    Worried About Running Out of Food in the Last Year: Never true    [place] in the Last Year: Never true  Transportation Needs: No Transportation Needs (02/13/2018)   PRAPARE - Hydrologist (Medical): No    Lack of Transportation (Non-Medical): No  Physical Activity: Unknown (02/13/2018)   Exercise Vital Sign    Days of Exercise per Week: 0 days    Minutes of Exercise per Session: Not on file  Stress: No Stress Concern Present (02/13/2018)   [place]    Feeling of Stress :  Not at all  Social Connections: Not on file     Review of Systems  Constitutional:  Negative for appetite change and unexpected weight change.  HENT:  Negative for congestion, sinus pressure and sore throat.   Eyes:  Negative for pain and visual disturbance.  Respiratory:  Negative for cough and chest tightness.        Breathing and cough better.   Cardiovascular:  Negative for chest pain, palpitations and leg swelling.  Gastrointestinal:  Negative for abdominal pain, diarrhea, nausea and vomiting.  Genitourinary:  Negative for difficulty urinating and dysuria.  Musculoskeletal:  Negative for joint swelling and myalgias.  Skin:  Negative for color change and rash.  Neurological:  Negative for dizziness, light-headedness and headaches.  Hematological:  Negative for adenopathy. Does not bruise/bleed easily.  Psychiatric/Behavioral:  Negative for agitation and dysphoric mood.        Objective:     BP 120/64 (BP Location: Left Arm, Patient  Position: Sitting, Cuff Size: Small)   Pulse 64   Temp 98.4 F (36.9 C) (Temporal)   Resp 14   Ht _0  (1.676 m)   Wt 129 lb 12.8 oz (58.9 kg)   LMP 10/27/1989   SpO2 99%   BMI 20.95 kg/m  Wt Readings from Last 3 Encounters:  05/03/22 129 lb 12.8 oz (58.9 kg)  04/12/22 130 lb 9.6 oz (59.2 kg)  04/09/22 130 lb 3.2 oz (59.1 kg)    Physical Exam Vitals reviewed.  Constitutional:      General: She is not in acute distress.    Appearance: Normal appearance.  HENT:     Head: Normocephalic and atraumatic.     Right Ear: External ear normal.     Left Ear: External ear normal.  Eyes:     General: No scleral icterus.       Right eye: No discharge.        Left eye: No discharge.     Conjunctiva/sclera: Conjunctivae normal.  Neck:     Thyroid: No thyromegaly.  Cardiovascular:     Rate and Rhythm: Normal rate and regular rhythm.  Pulmonary:     Effort: No respiratory distress.     Breath sounds: Normal breath sounds. No wheezing.     Comments: Breasts:  no nipple discharge or nipple retraction present.  Well healed incisions from previous surgery.  No axillary adenopathy.   Abdominal:     General: Bowel sounds are normal.     Palpations: Abdomen is soft.     Tenderness: There is no abdominal tenderness.  Musculoskeletal:        General: No swelling or tenderness.     Cervical back: Neck supple. No tenderness.  Lymphadenopathy:     Cervical: No cervical adenopathy.  Skin:    Findings: No erythema or rash.  Neurological:     Mental Status: She is alert.  Psychiatric:        Mood and Affect: Mood normal.        Behavior: Behavior normal.      Outpatient Encounter Medications as of 05/03/2022  Medication Sig   acetaminophen (TYLENOL) 650 MG CR tablet Take 650 mg by mouth every 8 (eight) hours as needed for pain.   Albuterol Sulfate (PROAIR RESPICLICK) 762 (90 Base) MCG/ACT AEPB Inhale 2 puffs into the lungs every 6 (six) hours as needed.   amLODipine (NORVASC) 5 MG  tablet TAKE 1 TABLET BY MOUTH ONCE DAILY AT DINNER TIME   apixaban (ELIQUIS) 2.5 MG  TABS tablet TAKE 1 TABLET BY MOUTH TWICE DAILY   Ascorbic Acid (VITAMIN C) 250 MG CHEW Chew 250 mg by mouth daily.    benzonatate (TESSALON) 100 MG capsule Take 1 capsule (100 mg total) by mouth 3 (three) times daily as needed for cough.   calcium citrate-vitamin D 500-500 MG-UNIT chewable tablet Chew 3 tablets by mouth daily.   Fluticasone-Umeclidin-Vilant (TRELEGY ELLIPTA) 100-62.5-25 MCG/ACT AEPB Inhale 1 puff into the lungs daily.   letrozole (FEMARA) 2.5 MG tablet Take 2.5 mg by mouth daily.   losartan (COZAAR) 100 MG tablet TAKE 1 TABLET BY MOUTH ONCE DAILY   Multiple Vitamin (MULTIVITAMIN) tablet Take 1 tablet by mouth daily.   nitrofurantoin, macrocrystal-monohydrate, (MACROBID) 100 MG capsule Take 1 capsule (100 mg total) by mouth 2 (two) times daily.   propranolol (INDERAL) 10 MG tablet Take 1 tablet (10 mg total) by mouth 3 (three) times daily as needed (heart rate). Take one tab everyday,  may take extra for breakthrough elevated heart rate   sertraline (ZOLOFT) 50 MG tablet TAKE 1 TABLET BY MOUTH AT BEDTIME   No facility-administered encounter medications on file as of 05/03/2022.     Lab Results  Component Value Date   WBC 6.0 12/15/2021   HGB 12.9 12/15/2021   HCT 38.2 12/15/2021   PLT 343.0 12/15/2021   GLUCOSE 102 (H) 04/28/2022   CHOL 205 (H) 04/28/2022   TRIG 155.0 (H) 04/28/2022   HDL 56.80 04/28/2022   LDLDIRECT 129.0 04/03/2021   LDLCALC 117 (H) 04/28/2022   ALT 12 04/28/2022   AST 19 04/28/2022   NA 140 04/28/2022   K 4.6 04/28/2022   CL 103 04/28/2022   CREATININE 0.96 04/28/2022   BUN 19 04/28/2022   CO2 30 04/28/2022   TSH 3.42 04/28/2022   HGBA1C 5.7 04/28/2022    US Venous Img Lower Unilateral Right  Result Date: 12/16/2021 CLINICAL DATA:  Right leg swelling. EXAM: RIGHT LOWER EXTREMITY VENOUS DOPPLER ULTRASOUND TECHNIQUE: Gray-scale sonography with compression, as  well as color and duplex ultrasound, were performed to evaluate the deep venous system(s) from the level of the common femoral vein through the popliteal and proximal calf veins. COMPARISON:  None. FINDINGS: VENOUS Normal compressibility of the common femoral, superficial femoral, and popliteal veins, as well as the visualized calf veins. Visualized portions of profunda femoral vein and great saphenous vein unremarkable. No filling defects to suggest DVT on grayscale or color Doppler imaging. Doppler waveforms show normal direction of venous flow, normal respiratory plasticity and response to augmentation. Limited views of the contralateral common femoral vein are unremarkable. OTHER None. Limitations: None. IMPRESSION: Negative. Electronically Signed   By: Titus Dubin M.D.   On: 12/16/2021 15:19       Assessment & Plan:   Problem List Items Addressed This Visit     Aortic atherosclerosis ([place])    Have discussed cholesterol medication.  Follow.        Asthma    Saw Dr Patsey Berthold - 04/2022 - Recommend trial of Trelegy Ellipta 1 puff daily. Also encouraged to use albuterol as needed.  She is doing well with trelegy.  Breathing is better. Cough is better.  See if can get pt assistance to help with cost.       Atrial fibrillation ([place])    Appears to be in SR on exam.  Continues on eliquis.        Breast cancer (HCC)    Saw Dr Bary Castilla - 05/2021.  Recommended f/u diagnostic  bilateral mammogram in one year. Continue femara.       Decreased GFR    GFR - slightly decreased on recent labs.  Stable. Continue to avoid antiinflammatories.  Follow.       Essential hypertension    Continue amlodipine and losartan.  Blood pressure as outlined.  Follow pressures.  Follow metabolic panel.       Relevant Orders   Basic metabolic panel   Health care maintenance    Physical today 05/03/22.  Saw GI.  cologuard negative.  Mammogram scheduled for tomorrow.       Hemochromatosis carrier    Follow cbc  and iron studies.       Hemochromatosis, hereditary ([place])    Continue to follow iron studies annually.  Saw hematology.        Hypercholesterolemia    Off cholesterol medication.  Discussed today. Declines to start cholesterol medication.  Follow lipid panel.       Relevant Orders   Lipid panel   Hepatic function panel   Hyperglycemia    Follow met b and a1c.       Relevant Orders   Hemoglobin A1c   Thrombophilia (HCC)    afib on eliquis.       Other Visit Diagnoses     Routine general medical examination at a health care facility    -  Primary        Einar Pheasant, MD

## 2022-05-03 NOTE — Assessment & Plan Note (Signed)
Physical today 05/03/22.  Saw GI.  cologuard negative.  Mammogram scheduled for tomorrow.

## 2022-05-04 ENCOUNTER — Ambulatory Visit
Admission: RE | Admit: 2022-05-04 | Discharge: 2022-05-04 | Disposition: A | Discharge status: Home / Self Care | Payer: Medicare Other | Source: Ambulatory Visit | Attending: General Surgery | Admitting: General Surgery

## 2022-05-04 ENCOUNTER — Encounter: Payer: Self-pay | Admitting: Internal Medicine

## 2022-05-04 DIAGNOSIS — C50919 Malignant neoplasm of unspecified site of unspecified female breast: Secondary | ICD-10-CM

## 2022-05-04 DIAGNOSIS — R922 Inconclusive mammogram: Secondary | ICD-10-CM | POA: Diagnosis not present

## 2022-05-04 LAB — URINALYSIS, ROUTINE W REFLEX MICROSCOPIC
Bilirubin Urine: NEGATIVE
Hgb urine dipstick: NEGATIVE
Ketones, ur: NEGATIVE
Nitrite: POSITIVE — AB
Specific Gravity, Urine: 1.015 (ref 1.000–1.030)
Total Protein, Urine: NEGATIVE
Urine Glucose: NEGATIVE
Urobilinogen, UA: 0.2 (ref 0.0–1.0)
pH: 7 (ref 5.0–8.0)

## 2022-05-04 NOTE — Assessment & Plan Note (Signed)
Continue amlodipine and losartan.  Blood pressure as outlined.  Follow pressures.  Follow metabolic panel.  

## 2022-05-04 NOTE — Assessment & Plan Note (Signed)
afib on eliquis.  

## 2022-05-04 NOTE — Assessment & Plan Note (Signed)
Off cholesterol medication.  Discussed today. Declines to start cholesterol medication.  Follow lipid panel.  

## 2022-05-04 NOTE — Assessment & Plan Note (Signed)
Follow cbc and iron studies.  

## 2022-05-04 NOTE — Assessment & Plan Note (Signed)
Appears to be in SR on exam.  Continues on eliquis.   

## 2022-05-04 NOTE — Assessment & Plan Note (Signed)
Continue to follow iron studies annually.  Saw hematology.

## 2022-05-04 NOTE — Assessment & Plan Note (Signed)
Have discussed cholesterol medication.  Follow.   

## 2022-05-04 NOTE — Assessment & Plan Note (Signed)
Saw Dr Patsey Berthold - 04/2022 - Recommend trial of Trelegy Ellipta 1 puff daily. Also encouraged to use albuterol as needed.  She is doing well with trelegy.  Breathing is better. Cough is better.  See if can get pt assistance to help with cost.

## 2022-05-04 NOTE — Assessment & Plan Note (Signed)
GFR - slightly decreased on recent labs.  Stable. Continue to avoid antiinflammatories.  Follow.

## 2022-05-04 NOTE — Assessment & Plan Note (Signed)
Follow met b and a1c.  

## 2022-05-04 NOTE — Assessment & Plan Note (Signed)
Saw Dr Bary Castilla - 05/2021.  Recommended f/u diagnostic bilateral mammogram in one year. Continue femara.

## 2022-05-05 ENCOUNTER — Other Ambulatory Visit: Payer: Self-pay | Admitting: General Surgery

## 2022-05-05 DIAGNOSIS — R928 Other abnormal and inconclusive findings on diagnostic imaging of breast: Secondary | ICD-10-CM

## 2022-05-05 DIAGNOSIS — N6489 Other specified disorders of breast: Secondary | ICD-10-CM

## 2022-05-06 ENCOUNTER — Other Ambulatory Visit: Payer: Self-pay

## 2022-05-06 ENCOUNTER — Encounter: Payer: Self-pay | Admitting: Internal Medicine

## 2022-05-06 DIAGNOSIS — R300 Dysuria: Secondary | ICD-10-CM

## 2022-05-06 NOTE — Telephone Encounter (Signed)
S/w pt - see result note

## 2022-05-07 ENCOUNTER — Other Ambulatory Visit (INDEPENDENT_AMBULATORY_CARE_PROVIDER_SITE_OTHER): Payer: Medicare Other

## 2022-05-07 DIAGNOSIS — R300 Dysuria: Secondary | ICD-10-CM

## 2022-05-07 LAB — URINALYSIS, ROUTINE W REFLEX MICROSCOPIC
Bilirubin Urine: NEGATIVE
Hgb urine dipstick: NEGATIVE
Ketones, ur: NEGATIVE
Nitrite: NEGATIVE
Specific Gravity, Urine: 1.01 (ref 1.000–1.030)
Total Protein, Urine: NEGATIVE
Urine Glucose: NEGATIVE
Urobilinogen, UA: 0.2 (ref 0.0–1.0)
pH: 6.5 (ref 5.0–8.0)

## 2022-05-09 LAB — URINE CULTURE
MICRO NUMBER:: 13737496
SPECIMEN QUALITY:: ADEQUATE

## 2022-05-10 ENCOUNTER — Other Ambulatory Visit: Payer: Self-pay

## 2022-05-10 MED ORDER — NITROFURANTOIN MONOHYD MACRO 100 MG PO CAPS: 100.0000 mg | ORAL_CAPSULE | Freq: Two times a day (BID) | ORAL | 0 refills | Status: DC

## 2022-05-11 ENCOUNTER — Telehealth: Payer: Self-pay | Admitting: Pulmonary Disease

## 2022-05-11 IMAGING — CR DG CHEST 2V
2 series · 2 of 2 positions shown · non-contrast
Comparison: Chest radiographs 10/01/2016.

CLINICAL DATA: Syncope. Additional history provided: Multiple
syncopal episodes today, history of atrial fibrillation,
hypertension.

EXAM:
CHEST - 2 VIEW

[chest lat]
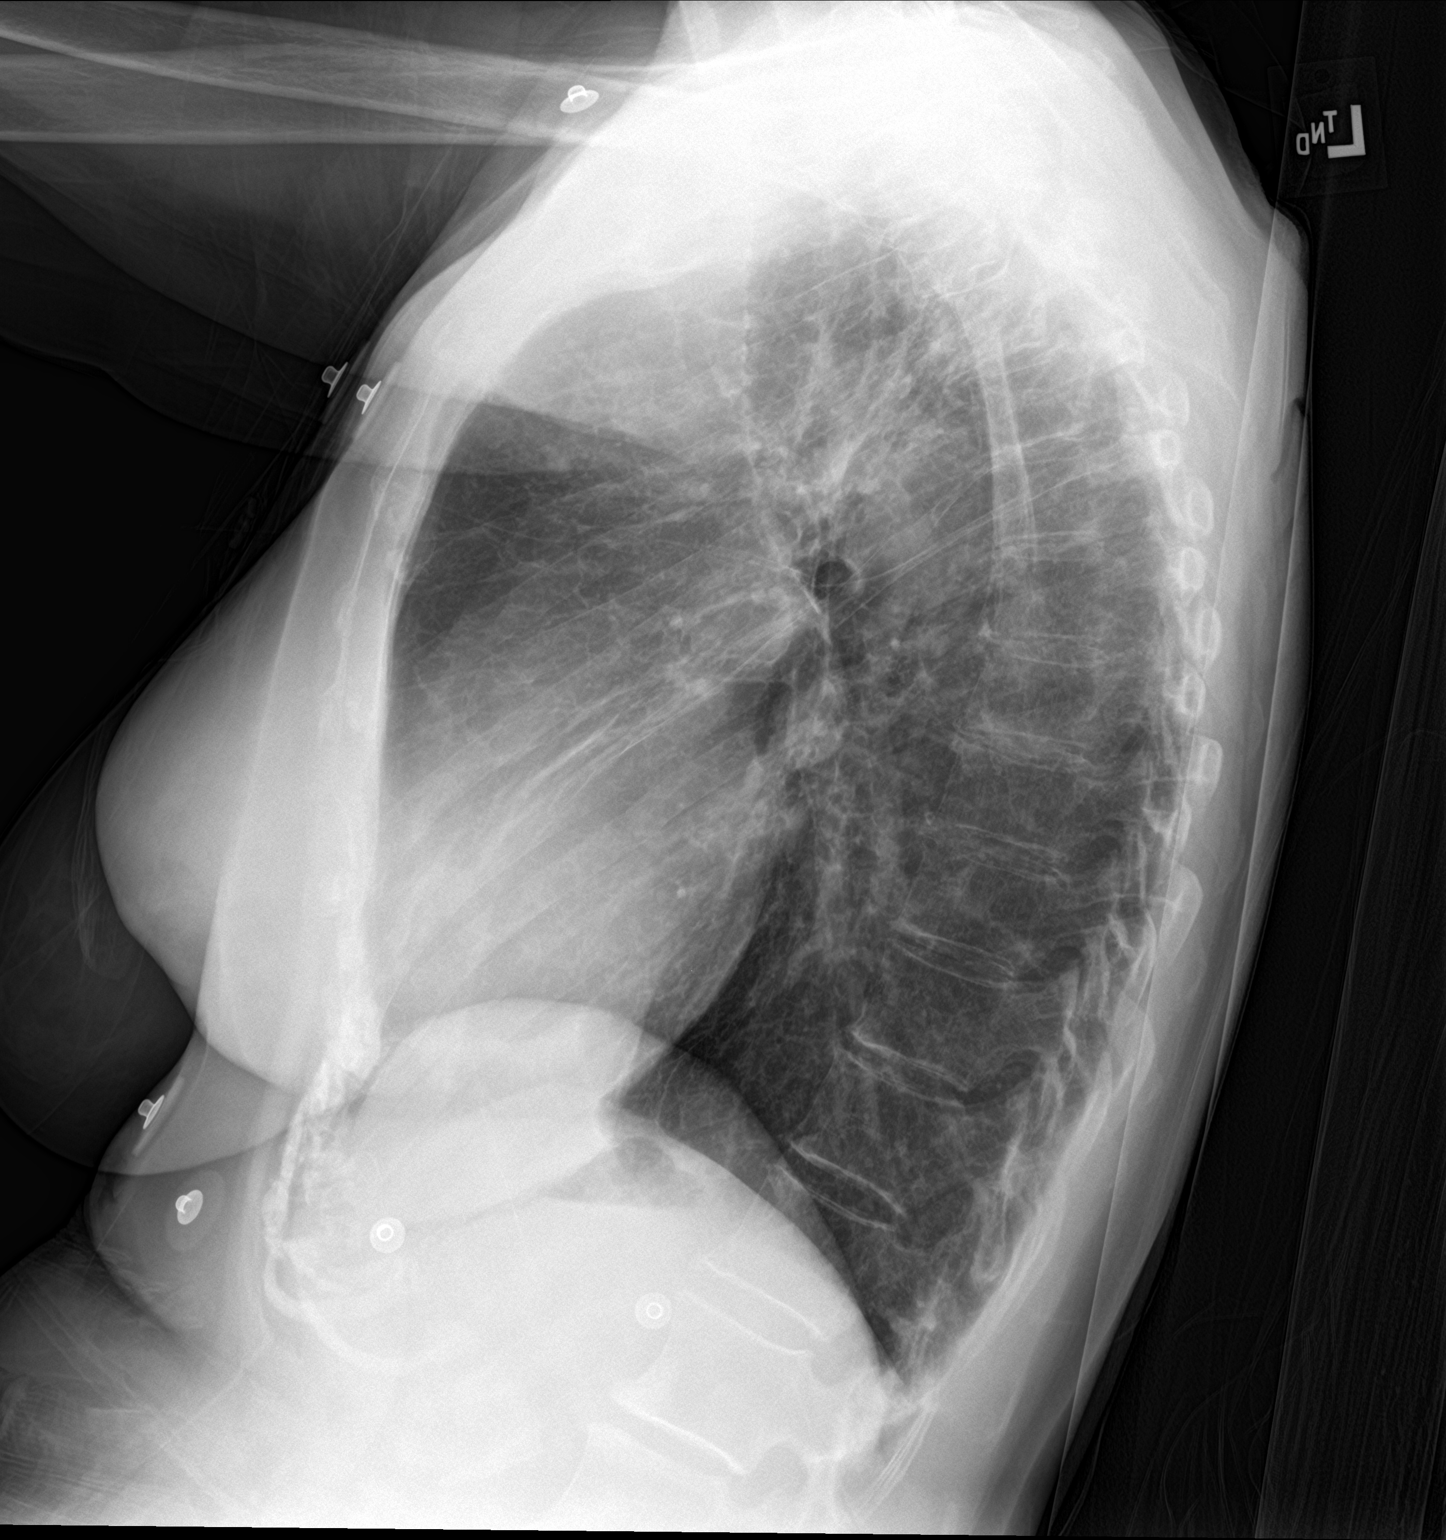

[chest ap]
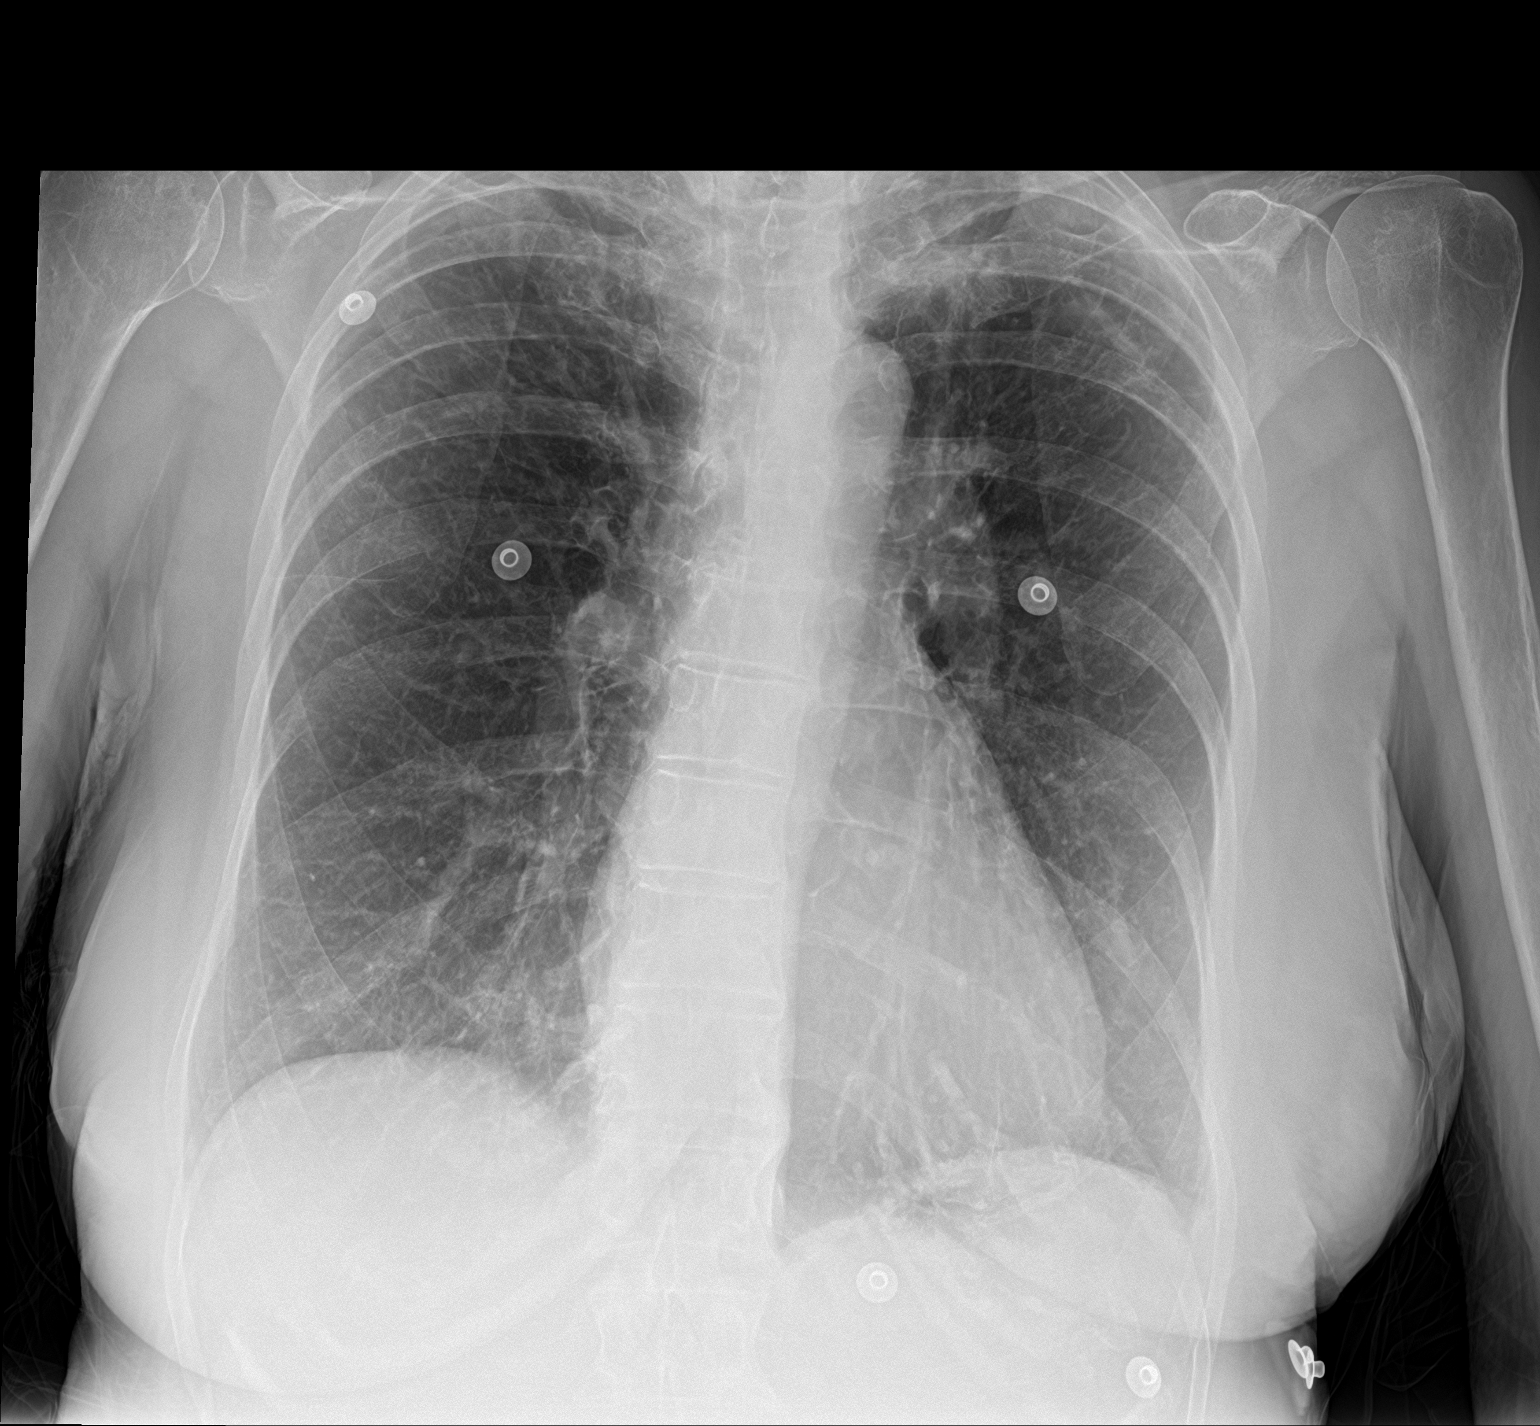

[2 of 2 positions shown; findings below may reference images not displayed]

FINDINGS: Heart size within normal limits. Redemonstrated biapical
pleuroparenchymal scarring (greater on the left). No appreciable
airspace consolidation or pulmonary edema. No evidence of pleural
effusion or pneumothorax. No acute bony abnormality identified. Mild
thoracic dextrocurvature.
IMPRESSION: No evidence of acute cardiopulmonary abnormality.

Redemonstrated biapical pleuroparenchymal scarring.

## 2022-05-11 MED ORDER — TRELEGY ELLIPTA 100-62.5-25 MCG/ACT IN AEPB: 1.0000 | INHALATION_SPRAY | Freq: Every day | RESPIRATORY_TRACT | 11 refills | Status: DC

## 2022-05-11 NOTE — Telephone Encounter (Signed)
Spoke to patient. She stated that trelegy is working well for her and she would like a RX. Rx sent to preferred pharmacy. Nothing further needed.

## 2022-05-24 ENCOUNTER — Ambulatory Visit
Admission: RE | Admit: 2022-05-24 | Discharge: 2022-05-24 | Disposition: A | Discharge status: Home / Self Care | Payer: Medicare Other | Source: Ambulatory Visit | Attending: General Surgery | Admitting: General Surgery

## 2022-05-24 ENCOUNTER — Other Ambulatory Visit: Payer: Self-pay | Admitting: General Surgery

## 2022-05-24 DIAGNOSIS — R928 Other abnormal and inconclusive findings on diagnostic imaging of breast: Secondary | ICD-10-CM

## 2022-05-24 DIAGNOSIS — N6489 Other specified disorders of breast: Secondary | ICD-10-CM | POA: Insufficient documentation

## 2022-05-24 HISTORY — PX: BREAST BIOPSY: SHX20

## 2022-05-25 ENCOUNTER — Other Ambulatory Visit: Payer: Self-pay | Admitting: General Surgery

## 2022-05-25 ENCOUNTER — Ambulatory Visit
Admission: RE | Admit: 2022-05-25 | Discharge: 2022-05-25 | Disposition: A | Discharge status: Home / Self Care | Payer: Medicare Other | Source: Ambulatory Visit | Attending: General Surgery | Admitting: General Surgery

## 2022-05-25 DIAGNOSIS — R928 Other abnormal and inconclusive findings on diagnostic imaging of breast: Secondary | ICD-10-CM

## 2022-05-25 HISTORY — PX: BREAST BIOPSY: SHX20

## 2022-05-25 LAB — SURGICAL PATHOLOGY

## 2022-05-26 LAB — SURGICAL PATHOLOGY

## 2022-07-12 ENCOUNTER — Ambulatory Visit: Payer: Medicare Other | Admitting: Medical

## 2022-07-13 ENCOUNTER — Ambulatory Visit (INDEPENDENT_AMBULATORY_CARE_PROVIDER_SITE_OTHER): Payer: Medicare Other | Admitting: Pulmonary Disease

## 2022-07-13 ENCOUNTER — Encounter: Payer: Self-pay | Admitting: Pulmonary Disease

## 2022-07-13 VITALS — BP 124/82 | HR 51 | Temp 98.3°F | Ht 66.0 in | Wt 130.6 lb

## 2022-07-13 DIAGNOSIS — J4540 Moderate persistent asthma, uncomplicated: Secondary | ICD-10-CM

## 2022-07-13 DIAGNOSIS — Z23 Encounter for immunization: Secondary | ICD-10-CM

## 2022-07-13 DIAGNOSIS — R053 Chronic cough: Secondary | ICD-10-CM | POA: Diagnosis not present

## 2022-07-13 NOTE — Patient Instructions (Signed)
Use your albuterol inhaler as needed, let us know if you have need to use it frequently.  This can be used for shortness of breath or cough.  We will see him in follow-up in 6 months time but please do give [place] a call prior to that should any new problems arise.

## 2022-07-13 NOTE — Progress Notes (Signed)
Subjective:    Patient ID: Anita Benjamin, female    DOB: 10-07-1933, 88 y.o.   MRN: [id number] Patient Care Team: Dale Trumann, MD as PCP - General (Internal Medicine) Antonieta Iba, MD as PCP - Cardiology (Cardiology) Lemar Livings Merrily Pew, MD as Consulting Physician (General Surgery) Antonieta Iba, MD as Consulting Physician (Cardiology) Carmina Miller, MD as Referring Physician (Radiation Oncology)  Chief Complaint  Patient presents with   Follow-up    Asthma. No SOB, wheezing or cough.    HPI Patient is an 88 year old lifelong never smoker, albeit with secondhand smoke exposure) who presents for follow-up on the issue of cough and shortness of breath.  She was last seen here on 12 April 2022.  At that time she was given a trial of albuterol to see if this would alleviate her symptoms.  She has been reluctant to try any inhalers.  She has changes consistent with persistent asthma.  She has been reluctant to try inhalers because she states that her husband who had COPD was "addicted to them".  I have explained to her that her symptoms of cough and shortness of breath are likely due to poorly compensated asthma.  I have urged her to try the inhalers as prescribed.  Of note the patient is also on letrozole for breast cancer therapy and this also can cause cough.  As noted she is not using as needed albuterol and discontinued use of Trelegy previously provided to her on her own.  States that she still has the inhaler of albuterol at home.  She is just not using it.   She does not endorse any fevers, chills or sweats.  No chest pain, no orthopnea, paroxysmal nocturnal dyspnea or lower extremity edema.  No calf tenderness.  Shortness of breath occurs mostly during exertion.  No other symptomatology endorsed  She is also on Inderal which can cause difficulties with controlling her symptoms as well given underlying asthma.    She wishes to have flu vaccine today.  DATA 11/20/2021 PFTs:  FEV1 1.6 L or 64% predicted, FVC 2.26 L or 85% of predicted, FEV1/FVC 56%, there is significant bronchodilator response with a net change of 14% and FEV1.  Lung volumes were reduced however the test was not reproducible for lung volumes or diffusion capacity.  On the effort independent portion of the curve small airways component is noted.  Diffusion capacity mildly reduced not corrected for hemoglobin.  Overall patient has changes consistent with asthma.   Review of Systems A 10 point review of systems was performed and it is as noted above otherwise negative.  Patient Active Problem List   Diagnosis Date Noted   Asthma 04/12/2022   Hyperglycemia 02/20/2022   Sinusitis, acute, maxillary 01/04/2022   Hypercalcemia 01/04/2022   Right leg swelling 12/16/2021   Foot pain, right 12/15/2021   Right ankle pain 12/15/2021   Thrombophilia 09/12/2021   Decreased GFR 09/11/2021   Hemochromatosis, hereditary 04/15/2021   Hemochromatosis carrier 04/05/2021   Aortic atherosclerosis 04/05/2021   Headache 04/03/2021   Atrial fibrillation 01/15/2021   Anemia 01/12/2021   Atrial fibrillation with RVR 01/01/2021   Hyponatremia 01/01/2021   CAP (community acquired pneumonia) 12/25/2020   Breast cancer 06/09/2020   Diarrhea 04/11/2019   Hair loss 06/10/2017   Fatigue 02/19/2017   Chest pain 02/12/2017   Encounter for anticoagulation discussion and counseling 10/05/2016   Cough 10/01/2016   Gas 10/27/2015   Dysuria 09/17/2015   Left hip pain 07/21/2015  Neck pain 06/09/2015   Syncope 03/25/2015   Left wrist pain 03/25/2015   SOB (shortness of breath) 12/01/2014   Abdominal discomfort 12/01/2014   Atrial flutter 11/15/2014   Stress 11/08/2014   Difficulty sleeping 11/08/2014   Health care maintenance 11/08/2014   Adjustment disorder 10/14/2014   Tachycardia 07/17/2014   Hyperkalemia 07/17/2014   History of colonic polyps 03/09/2014   Decreased sense of taste 03/09/2014   Abnormal  mammogram, unspecified 07/31/2013   Abnormal mammogram 01/20/2013   Essential hypertension 10/29/2012   Hypercholesterolemia 10/29/2012   Leukopenia 10/29/2012   Social History   Tobacco Use   Smoking status: Never   Smokeless tobacco: Never  Substance Use Topics   Alcohol use: No    Alcohol/week: 0.0 standard drinks of alcohol   Allergies  Allergen Reactions   Oruvail [Ketoprofen]    Relafen [Nabumetone]    Timentin [Ticarcillin-Pot Clavulanate]    Current Meds  Medication Sig   acetaminophen (TYLENOL) 650 MG CR tablet Take 650 mg by mouth every 8 (eight) hours as needed for pain.   amLODipine (NORVASC) 5 MG tablet TAKE 1 TABLET BY MOUTH ONCE DAILY AT DINNER TIME   Ascorbic Acid (VITAMIN C) 250 MG CHEW Chew 250 mg by mouth daily.    calcium citrate-vitamin D 500-500 MG-UNIT chewable tablet Chew 3 tablets by mouth daily.   letrozole (FEMARA) 2.5 MG tablet Take 2.5 mg by mouth daily.   Multiple Vitamin (MULTIVITAMIN) tablet Take 1 tablet by mouth daily.   apixaban (ELIQUIS) 2.5 MG TABS tablet TAKE 1 TABLET BY MOUTH TWICE DAILY   [DISCONTINUED] losartan (COZAAR) 100 MG tablet TAKE 1 TABLET BY MOUTH ONCE DAILY   propranolol (INDERAL) 10 MG tablet Take 1 tablet (10 mg total) by mouth 3 (three) times daily as needed (heart rate). Take one tab everyday,  may take extra for breakthrough elevated heart rate   sertraline (ZOLOFT) 50 MG tablet TAKE 1 TABLET BY MOUTH AT BEDTIME   Immunization History  Administered Date(s) Administered   Fluad Quad(high Dose 65+) 06/22/2019, 07/30/2020, 06/12/2021, 07/13/2022   Influenza, High Dose Seasonal PF 06/10/2017, 09/14/2018   Influenza,inj,Quad PF,6+ Mos 06/27/2013, 07/04/2014, 06/05/2015, 05/27/2016   PFIZER(Purple Top)SARS-COV-2 Vaccination 10/31/2019, 11/21/2019   Pneumococcal Conjugate-13 10/21/2015   Pneumococcal Polysaccharide-23 10/06/2017   Tdap 10/27/2012        Objective:   Physical Exam BP 124/82 (BP Location: Right Arm, Cuff  Size: Normal)   Pulse (!) 51   Temp 98.3 F (36.8 C)   Ht 5\' 6"  (1.676 m)   Wt 130 lb 9.6 oz (59.2 kg)   LMP 10/27/1989   SpO2 98%   BMI 21.08 kg/m   SpO2: 98 % O2 Device: None (Room air)  GENERAL: Thin woman, no acute distress, does sound nasally congested, fully ambulatory.  No conversational dyspnea. HEAD: Normocephalic, atraumatic.  EYES: Pupils equal, round, reactive to light.  No scleral icterus.  MOUTH: Nose/mouth/throat not examined due to masking requirements for COVID 19. NECK: Supple. No thyromegaly. Trachea midline. No JVD.  No adenopathy. PULMONARY: Good air entry bilaterally.  No adventitious sounds. CARDIOVASCULAR: S1 and S2. Regular rate and rhythm.  No rubs, murmurs or gallops heard. ABDOMEN: Benign. MUSCULOSKELETAL: No joint deformity, no clubbing, no edema.  NEUROLOGIC: No focal deficit, no gait disturbance, speech is fluent. SKIN: Intact,warm,dry. PSYCH: Mood and behavior normal.      Assessment & Plan:     ICD-10-CM   1. Moderate persistent asthma without complication  J45.40    Recommend  using albuterol as needed Ideally should be on maintenance inhaler She is reluctant to use any inhaler    2. Chronic cough  R05.3    Multifactorial Poorly controlled asthma Medication side effect (letrozole, Inderal)    3. Need for immunization against influenza  Z23 Flu Vaccine QUAD High Dose(Fluad)   Proceed with influenza vaccination today     Orders Placed This Encounter  Procedures   Flu Vaccine QUAD High Dose(Fluad)   The patient has been educated with regards to management of asthma.  She was at least willing to restart use of as needed albuterol.  Also discussed potential side effects of her current medications to include letrozole and Inderal which can aggravate cough.  We will see the patient in follow-up in 6 months time or sooner should any new problems arise.  Gailen Shelter, MD Advanced Bronchoscopy PCCM [place]  [facility]   *This note was dictated using voice recognition software/Dragon.  Despite best efforts to proofread, errors can occur which can change the meaning. Any transcriptional errors that result from this process are unintentional and may not be fully corrected at the time of dictation.

## 2022-07-15 ENCOUNTER — Other Ambulatory Visit: Payer: Self-pay | Admitting: Internal Medicine

## 2022-08-05 ENCOUNTER — Other Ambulatory Visit: Payer: Self-pay | Admitting: Cardiovascular Disease

## 2022-08-05 NOTE — Telephone Encounter (Signed)
Prescription refill request for Eliquis received. Indication: Last office visit: 04/09/22  Cadence Furth PA-C Scr: 0.96 on 04/28/22 Age: 88 Weight: 59.1kg  Based ona above findings Eliquis 2.'5mg'$  twice daily is the appropriate dose.  Refill approved.

## 2022-08-05 NOTE — Telephone Encounter (Signed)
Refill request

## 2022-08-20 ENCOUNTER — Ambulatory Visit: Payer: Medicare Other | Admitting: Radiation Oncology

## 2022-08-24 ENCOUNTER — Other Ambulatory Visit: Payer: Self-pay | Admitting: Internal Medicine

## 2022-09-02 ENCOUNTER — Other Ambulatory Visit (INDEPENDENT_AMBULATORY_CARE_PROVIDER_SITE_OTHER): Payer: Medicare Other

## 2022-09-02 DIAGNOSIS — R739 Hyperglycemia, unspecified: Secondary | ICD-10-CM

## 2022-09-02 DIAGNOSIS — I10 Essential (primary) hypertension: Secondary | ICD-10-CM | POA: Diagnosis not present

## 2022-09-02 DIAGNOSIS — E7800 Pure hypercholesterolemia, unspecified: Secondary | ICD-10-CM | POA: Diagnosis not present

## 2022-09-02 LAB — BASIC METABOLIC PANEL
BUN: 20 mg/dL (ref 6–23)
CO2: 31 mEq/L (ref 19–32)
Calcium: 9.5 mg/dL (ref 8.4–10.5)
Chloride: 104 mEq/L (ref 96–112)
Creatinine, Ser: 1.04 mg/dL (ref 0.40–1.20)
GFR: 48.67 mL/min — ABNORMAL LOW (ref >60.00)
Glucose, Bld: 90 mg/dL (ref 70–99)
Potassium: 4.6 mEq/L (ref 3.5–5.1)
Sodium: 142 mEq/L (ref 135–145)

## 2022-09-02 LAB — LIPID PANEL
Cholesterol: 203 mg/dL — ABNORMAL HIGH (ref 0–200)
HDL: 58.9 mg/dL (ref >39.00)
LDL Cholesterol: 110 mg/dL — ABNORMAL HIGH (ref 0–99)
NonHDL: 144.32
Total CHOL/HDL Ratio: 3
Triglycerides: 174 mg/dL — ABNORMAL HIGH (ref 0.0–149.0)
VLDL: 34.8 mg/dL (ref 0.0–40.0)

## 2022-09-02 LAB — HEPATIC FUNCTION PANEL
ALT: 12 U/L (ref 0–35)
AST: 17 U/L (ref 0–37)
Albumin: 4.2 g/dL (ref 3.5–5.2)
Alkaline Phosphatase: 57 U/L (ref 39–117)
Bilirubin, Direct: 0.1 mg/dL (ref 0.0–0.3)
Total Bilirubin: 0.6 mg/dL (ref 0.2–1.2)
Total Protein: 6.7 g/dL (ref 6.0–8.3)

## 2022-09-02 LAB — HEMOGLOBIN A1C: Hgb A1c MFr Bld: 5.9 % (ref 4.6–6.5)

## 2022-09-06 ENCOUNTER — Ambulatory Visit
Admission: RE | Admit: 2022-09-06 | Discharge: 2022-09-06 | Disposition: A | Discharge status: Home / Self Care | Payer: Medicare Other | Source: Ambulatory Visit | Attending: Radiation Oncology | Admitting: Radiation Oncology

## 2022-09-06 ENCOUNTER — Encounter: Payer: Self-pay | Admitting: Radiation Oncology

## 2022-09-06 VITALS — BP 128/73 | HR 56 | Temp 97.6°F | Resp 16 | Ht 66.5 in | Wt 131.0 lb

## 2022-09-06 DIAGNOSIS — Z170 Estrogen receptor positive status [ER+]: Secondary | ICD-10-CM | POA: Diagnosis not present

## 2022-09-06 DIAGNOSIS — Z79811 Long term (current) use of aromatase inhibitors: Secondary | ICD-10-CM | POA: Insufficient documentation

## 2022-09-06 DIAGNOSIS — C50412 Malignant neoplasm of upper-outer quadrant of left female breast: Secondary | ICD-10-CM | POA: Diagnosis present

## 2022-09-06 DIAGNOSIS — Z923 Personal history of irradiation: Secondary | ICD-10-CM | POA: Insufficient documentation

## 2022-09-06 NOTE — Progress Notes (Signed)
Radiation Oncology Follow up Note  Name: Anita Benjamin   Date:   09/06/2022 MRN:  [id number] DOB: 04/14/1934    This 88 y.o. 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  REFERRING PROVIDER: Einar Pheasant, MD  HPI: Patient is a 88 year old female now out 2 years having completed whole breast radiation to her left breast for stage Ia invasive mammary carcinoma.  Seen today in follow-up she is doing well specifically denies any breast tenderness cough or bone pain..  She did have some focal asymmetry on her last mammogram back in August and underwent stereotactic guided biopsy showing no evidence of atypia or malignancy.  She is currently on letrozole tolerating it well without side effect.  COMPLICATIONS OF TREATMENT: none  FOLLOW UP COMPLIANCE: keeps appointments   PHYSICAL EXAM:  BP 128/73 (BP Location: Right Arm, Patient Position: Sitting, Cuff Size: Normal)   Pulse (!) 56   Temp 97.6 F (36.4 C) (Tympanic)   Resp 16   Ht 5' 6.5" (1.689 m) Comment: stated ht  Wt 131 lb (59.4 kg)   LMP 10/27/1989   BMI 20.83 kg/m  Lungs are clear to A&P cardiac examination essentially unremarkable with regular rate and rhythm. No dominant mass or nodularity is noted in either breast in 2 positions examined. Incision is well-healed. No axillary or supraclavicular adenopathy is appreciated. Cosmetic result is excellent.  Well-developed well-nourished patient in NAD. HEENT reveals PERLA, EOMI, discs not visualized.  Oral cavity is clear. No oral mucosal lesions are identified. Neck is clear without evidence of cervical or supraclavicular adenopathy. Lungs are clear to A&P. Cardiac examination is essentially unremarkable with regular rate and rhythm without murmur rub or thrill. Abdomen is benign with no organomegaly or masses noted. Motor sensory and DTR levels are equal and symmetric in the  upper and lower extremities. Cranial nerves II through XII are grossly intact. Proprioception is intact. No peripheral adenopathy or edema is identified. No motor or sensory levels are noted. Crude visual fields are within normal range.  RADIOLOGY RESULTS: Mammogram and ultrasound reviewed compatible with above-stated findings  PLAN: Present time patient is doing well now out 2 years with no evidence of disease and pleased with her overall progress I asked to see her back in 1 year for follow-up and I will then discontinue follow-up care.  Patient knows to call with any concerns.  I would like to take this opportunity to thank you for allowing me to participate in the care of your patient.Noreene Filbert, MD

## 2022-09-07 ENCOUNTER — Ambulatory Visit: Payer: Medicare Other | Admitting: Internal Medicine

## 2022-09-07 ENCOUNTER — Encounter: Payer: Self-pay | Admitting: Internal Medicine

## 2022-09-07 VITALS — BP 126/70 | HR 56 | Temp 98.1°F | Resp 17 | Ht 65.5 in | Wt 129.6 lb

## 2022-09-07 DIAGNOSIS — I4891 Unspecified atrial fibrillation: Secondary | ICD-10-CM

## 2022-09-07 DIAGNOSIS — D649 Anemia, unspecified: Secondary | ICD-10-CM

## 2022-09-07 DIAGNOSIS — R944 Abnormal results of kidney function studies: Secondary | ICD-10-CM

## 2022-09-07 DIAGNOSIS — D6859 Other primary thrombophilia: Secondary | ICD-10-CM

## 2022-09-07 DIAGNOSIS — I700 Atherosclerosis of aorta: Secondary | ICD-10-CM

## 2022-09-07 DIAGNOSIS — C50919 Malignant neoplasm of unspecified site of unspecified female breast: Secondary | ICD-10-CM

## 2022-09-07 DIAGNOSIS — R739 Hyperglycemia, unspecified: Secondary | ICD-10-CM | POA: Diagnosis not present

## 2022-09-07 DIAGNOSIS — E7800 Pure hypercholesterolemia, unspecified: Secondary | ICD-10-CM | POA: Diagnosis not present

## 2022-09-07 DIAGNOSIS — Z148 Genetic carrier of other disease: Secondary | ICD-10-CM

## 2022-09-07 DIAGNOSIS — I10 Essential (primary) hypertension: Secondary | ICD-10-CM

## 2022-09-07 DIAGNOSIS — J4520 Mild intermittent asthma, uncomplicated: Secondary | ICD-10-CM

## 2022-09-07 DIAGNOSIS — F439 Reaction to severe stress, unspecified: Secondary | ICD-10-CM

## 2022-09-07 DIAGNOSIS — R928 Other abnormal and inconclusive findings on diagnostic imaging of breast: Secondary | ICD-10-CM

## 2022-09-07 DIAGNOSIS — F4329 Adjustment disorder with other symptoms: Secondary | ICD-10-CM

## 2022-09-07 NOTE — Progress Notes (Signed)
Patient ID: Anita Benjamin, female   DOB: [birth year], 88 y.o.   MRN: [id number]   Subjective:    Patient ID: Anita Benjamin, female    DOB: [birth year], 88 y.o.   MRN: [id number]   Patient here for  Chief Complaint  Patient presents with   Follow-up   Hypertension   Hyperlipidemia   .   HPI Here to follow up regarding afib, hypertension and hypercholesterolemia.  Has been followed by Dr Bary Castilla for f/u breast.  S/p biopsy x2 recently.  Pathology ok.  Recommended 6 month f/u.  Saw Dr Donella Stade yesterday.  Doing well.  Recommended f/u in one year.  She reports she is doing relatively well.  No chest pain or sob reported.  No increased cough.  Not on trelegy. Discussed cost of medication.  Has albuterol inhaler if needed.  No abdominal pain or bowel change reported.   Past Medical History:  Diagnosis Date   Anemia    Atrial fibrillation (HCC)    Atrial flutter (HCC)    Fibrocystic breast disease    GERD (gastroesophageal reflux disease)    History of chicken pox    Hypercholesterolemia    Hypertension    Past Surgical History:  Procedure Laterality Date   BREAST BIOPSY Right 2014   NEG   BREAST BIOPSY Right 05/24/2022   stereo bx asymmetry, x marker, path pending   BREAST BIOPSY Right 05/25/2022   [place] Core BX, Coil Clip - path pending   BREAST LUMPECTOMY     BREAST LUMPECTOMY WITH SENTINEL LYMPH NODE BIOPSY Left 06/04/2020   Procedure: BREAST LUMPECTOMY WITH SENTINEL LYMPH NODE BX;  Surgeon: Robert Bellow, MD;  Location: [facility] ORS;  Service: General;  Laterality: Left;   CATARACT EXTRACTION     DILATION AND CURETTAGE OF UTERUS     Laser repair of torn retina     Family History  Problem Relation Age of Onset   Heart disease Mother 81       Heart attack   Cancer Father 33       lung cancer   Breast cancer Paternal Aunt        x3   Colon cancer Neg Hx    Social History   Socioeconomic History   Marital status: Widowed    Spouse name: Not on file   Number of children: 2    Years of education: Not on file   Highest education level: Not on file  Occupational History   Not on file  Tobacco Use   Smoking status: Never   Smokeless tobacco: Never  Vaping Use   Vaping Use: Never used  Substance and Sexual Activity   Alcohol use: No    Alcohol/week: 0.0 standard drinks of alcohol   Drug use: No   Sexual activity: Not on file  Other Topics Concern   Not on file  Social History Narrative   Not on file   Social Determinants of Health   Financial Resource Strain: Low Risk  (02/13/2018)   Overall Financial Resource Strain (CARDIA)    Difficulty of Paying Living Expenses: Not hard at all  Food Insecurity: No Food Insecurity (02/13/2018)   Hunger Vital Sign    Worried About Running Out of Food in the Last Year: Never true    [place] in the Last Year: Never true  Transportation Needs: No Transportation Needs (02/13/2018)   PRAPARE - Hydrologist (Medical): No    Lack of Transportation (  Non-Medical): No  Physical Activity: Unknown (02/13/2018)   Exercise Vital Sign    Days of Exercise per Week: 0 days    Minutes of Exercise per Session: Not on file  Stress: No Stress Concern Present (02/13/2018)   [place]    Feeling of Stress : Not at all  Social Connections: Not on file     Review of Systems  Constitutional:  Negative for appetite change and unexpected weight change.  HENT:  Negative for congestion and sinus pressure.   Respiratory:  Negative for chest tightness and shortness of breath.        Occasional cough.  No significant cough or congestion.   Cardiovascular:  Negative for chest pain, palpitations and leg swelling.  Gastrointestinal:  Negative for abdominal pain, diarrhea, nausea and vomiting.  Genitourinary:  Negative for difficulty urinating and dysuria.  Musculoskeletal:  Negative for joint swelling and myalgias.  Skin:  Negative for color  change and rash.  Neurological:  Negative for dizziness and headaches.  Psychiatric/Behavioral:  Negative for agitation and dysphoric mood.        Objective:     BP 126/70 (BP Location: Right Arm, Patient Position: Sitting, Cuff Size: Small)   Pulse (!) 56   Temp 98.1 F (36.7 C) (Temporal)   Resp 17   Ht 5' 5.5" (1.664 m)   Wt 129 lb 9.6 oz (58.8 kg)   LMP 10/27/1989   SpO2 97%   BMI 21.24 kg/m  Wt Readings from Last 3 Encounters:  09/07/22 129 lb 9.6 oz (58.8 kg)  09/06/22 131 lb (59.4 kg)  07/13/22 130 lb 9.6 oz (59.2 kg)    Physical Exam Vitals reviewed.  Constitutional:      General: She is not in acute distress.    Appearance: Normal appearance.  HENT:     Head: Normocephalic and atraumatic.     Right Ear: External ear normal.     Left Ear: External ear normal.  Eyes:     General: No scleral icterus.       Right eye: No discharge.        Left eye: No discharge.     Conjunctiva/sclera: Conjunctivae normal.  Neck:     Thyroid: No thyromegaly.  Cardiovascular:     Rate and Rhythm: Normal rate and regular rhythm.  Pulmonary:     Effort: No respiratory distress.     Breath sounds: Normal breath sounds. No wheezing.  Abdominal:     General: Bowel sounds are normal.     Palpations: Abdomen is soft.     Tenderness: There is no abdominal tenderness.  Musculoskeletal:        General: No swelling or tenderness.     Cervical back: Neck supple. No tenderness.  Lymphadenopathy:     Cervical: No cervical adenopathy.  Skin:    Findings: No erythema or rash.  Neurological:     Mental Status: She is alert.  Psychiatric:        Mood and Affect: Mood normal.        Behavior: Behavior normal.      Outpatient Encounter Medications as of 09/07/2022  Medication Sig   acetaminophen (TYLENOL) 650 MG CR tablet Take 650 mg by mouth every 8 (eight) hours as needed for pain.   amLODipine (NORVASC) 5 MG tablet TAKE 1 TABLET BY MOUTH ONCE DAILY AT DINNER TIME   apixaban  (ELIQUIS) 2.5 MG TABS tablet TAKE 1 TABLET BY MOUTH TWICE  DAILY   Ascorbic Acid (VITAMIN C) 250 MG CHEW Chew 250 mg by mouth daily.    benzonatate (TESSALON) 100 MG capsule Take 1 capsule (100 mg total) by mouth 3 (three) times daily as needed for cough.   calcium citrate-vitamin D 500-500 MG-UNIT chewable tablet Chew 3 tablets by mouth daily.   letrozole (FEMARA) 2.5 MG tablet Take 2.5 mg by mouth daily.   losartan (COZAAR) 100 MG tablet TAKE 1 TABLET BY MOUTH ONCE DAILY   Multiple Vitamin (MULTIVITAMIN) tablet Take 1 tablet by mouth daily.   nitrofurantoin, macrocrystal-monohydrate, (MACROBID) 100 MG capsule Take 1 capsule (100 mg total) by mouth 2 (two) times daily.   propranolol (INDERAL) 10 MG tablet Take 1 tablet (10 mg total) by mouth 3 (three) times daily as needed (heart rate). Take one tab everyday,  may take extra for breakthrough elevated heart rate   sertraline (ZOLOFT) 50 MG tablet TAKE 1 TABLET BY MOUTH AT BEDTIME   [DISCONTINUED] Albuterol Sulfate (PROAIR RESPICLICK) 269 (90 Base) MCG/ACT AEPB Inhale 2 puffs into the lungs every 6 (six) hours as needed. (Patient not taking: Reported on 09/07/2022)   [DISCONTINUED] Fluticasone-Umeclidin-Vilant (TRELEGY ELLIPTA) 100-62.5-25 MCG/ACT AEPB Inhale 1 puff into the lungs daily. (Patient not taking: Reported on 09/07/2022)   No facility-administered encounter medications on file as of 09/07/2022.     Lab Results  Component Value Date   WBC 6.0 12/15/2021   HGB 12.9 12/15/2021   HCT 38.2 12/15/2021   PLT 343.0 12/15/2021   GLUCOSE 90 09/02/2022   CHOL 203 (H) 09/02/2022   TRIG 174.0 (H) 09/02/2022   HDL 58.90 09/02/2022   LDLDIRECT 129.0 04/03/2021   LDLCALC 110 (H) 09/02/2022   ALT 12 09/02/2022   AST 17 09/02/2022   NA 142 09/02/2022   K 4.6 09/02/2022   CL 104 09/02/2022   CREATININE 1.04 09/02/2022   BUN 20 09/02/2022   CO2 31 09/02/2022   TSH 3.42 04/28/2022   HGBA1C 5.9 09/02/2022       Assessment & Plan:   Problem  List Items Addressed This Visit     Abnormal mammogram    05/2022 - breast biopsy - no evidence of atypia or malignancy.  Recommended f/u 6 months (Dr Bary Castilla)      Adjustment disorder    Doing well on zoloft. Follow.      Anemia    Follow cbc.       Aortic atherosclerosis (HCC)    Have discussed cholesterol medication.  Follow.        Asthma    Saw Dr Patsey Berthold - 04/2022 - Recommend trial of Trelegy Ellipta 1 puff daily. Also encouraged to use albuterol as needed.  She was doing well with trelegy.  Breathing - better. Cough - better.  See if can get pt assistance to help with cost. Not on trelegy now.       Atrial fibrillation ([place])    Appears to be in SR on exam.  Continues on eliquis.        Breast cancer (HCC)    Saw Dr Bary Castilla - 05/2021.  Recommended f/u diagnostic bilateral mammogram in one year. Mammogram 05/2022 - f/u views.  Biopsy - ok. Recommended f/u in 6 months. Continue femara.       Decreased GFR    GFR - slightly decreased on recent labs.  Stable. Continue to avoid antiinflammatories.  Stay hydrated. Follow.       Relevant Orders   Basic metabolic panel   Essential  hypertension - Primary    Continue amlodipine and losartan.  Blood pressure as outlined.  Follow pressures.  Follow metabolic panel.       Relevant Orders   Basic Metabolic Panel (BMET)   Hemochromatosis carrier    Follow cbc and iron studies.       Hypercholesterolemia    Off cholesterol medication.  Discussed today. Declines to start cholesterol medication.  Follow lipid panel.       Relevant Orders   Lipid Profile   Hepatic function panel   Hyperglycemia    Follow met b and a1c.       Relevant Orders   HgB A1c   Stress    Continue zoloft.  Stable.       Thrombophilia ([place])    afib on eliquis.       Relevant Orders   CBC w/Diff     Einar Pheasant, MD

## 2022-09-12 ENCOUNTER — Encounter: Payer: Self-pay | Admitting: Internal Medicine

## 2022-09-12 ENCOUNTER — Telehealth: Payer: Self-pay | Admitting: Internal Medicine

## 2022-09-12 NOTE — Assessment & Plan Note (Signed)
05/2022 - breast biopsy - no evidence of atypia or malignancy.  Recommended f/u 6 months (Dr Bary Castilla)

## 2022-09-12 NOTE — Assessment & Plan Note (Signed)
Saw Dr Bary Castilla - 05/2021.  Recommended f/u diagnostic bilateral mammogram in one year. Mammogram 05/2022 - f/u views.  Biopsy - ok. Recommended f/u in 6 months. Continue femara.

## 2022-09-12 NOTE — Assessment & Plan Note (Signed)
Follow cbc and iron studies.  

## 2022-09-12 NOTE — Telephone Encounter (Signed)
Needs medication assistance for trelegy and eliquis.

## 2022-09-12 NOTE — Assessment & Plan Note (Signed)
Doing well on zoloft.  Follow.   

## 2022-09-12 NOTE — Assessment & Plan Note (Signed)
Appears to be in SR on exam.  Continues on eliquis.

## 2022-09-12 NOTE — Assessment & Plan Note (Signed)
GFR - slightly decreased on recent labs.  Stable. Continue to avoid antiinflammatories.  Stay hydrated. Follow.

## 2022-09-12 NOTE — Assessment & Plan Note (Signed)
Continue amlodipine and losartan.  Blood pressure as outlined.  Follow pressures.  Follow metabolic panel.

## 2022-09-12 NOTE — Assessment & Plan Note (Signed)
Off cholesterol medication.  Discussed today. Declines to start cholesterol medication.  Follow lipid panel.

## 2022-09-12 NOTE — Assessment & Plan Note (Signed)
Follow met b and a1c.  

## 2022-09-12 NOTE — Assessment & Plan Note (Signed)
Follow cbc.  

## 2022-09-12 NOTE — Assessment & Plan Note (Signed)
Continue zoloft.  Stable.  

## 2022-09-12 NOTE — Assessment & Plan Note (Signed)
Saw Dr Patsey Berthold - 04/2022 - Recommend trial of Trelegy Ellipta 1 puff daily. Also encouraged to use albuterol as needed.  She was doing well with trelegy.  Breathing - better. Cough - better.  See if can get pt assistance to help with cost. Not on trelegy now.

## 2022-09-12 NOTE — Assessment & Plan Note (Signed)
Have discussed cholesterol medication.  Follow.

## 2022-09-12 NOTE — Assessment & Plan Note (Signed)
afib on eliquis.

## 2022-09-13 ENCOUNTER — Telehealth: Payer: Self-pay | Admitting: Cardiovascular Disease

## 2022-09-13 ENCOUNTER — Other Ambulatory Visit: Payer: Self-pay

## 2022-09-13 ENCOUNTER — Other Ambulatory Visit: Payer: Self-pay | Admitting: Cardiovascular Disease

## 2022-09-13 DIAGNOSIS — I10 Essential (primary) hypertension: Secondary | ICD-10-CM

## 2022-09-13 DIAGNOSIS — J4520 Mild intermittent asthma, uncomplicated: Secondary | ICD-10-CM

## 2022-09-13 NOTE — Telephone Encounter (Signed)
Referral for pt assistance placed

## 2022-09-13 NOTE — Telephone Encounter (Signed)
Please schedule 3 month F/U as advised per last AVS for refills. Thank you!

## 2022-09-13 NOTE — Telephone Encounter (Signed)
LVM to schedule appt, please schedule 

## 2022-09-14 ENCOUNTER — Telehealth: Payer: Self-pay | Admitting: Pharmacist

## 2022-09-14 NOTE — Progress Notes (Unsigned)
Contacted patient regarding referral for medication access from Einar Pheasant, MD .   Left patient a voicemail to return my call at their [place], PharmD, [place] [phone]

## 2022-09-16 NOTE — Progress Notes (Signed)
Care Guide Note  09/16/2022 Name: Anita Benjamin MRN: [id number] DOB: 07-13-1934  Referred by: Dale Avenel, MD Reason for referral : No chief complaint on file.   Anita Benjamin is a 88 y.o. year old female who is a primary care patient of Dale [place], MD. Anita Benjamin was referred to the pharmacist for assistance related to DM.    Successful contact was made with the patient to discuss pharmacy services including being ready for the pharmacist to call at least 5 minutes before the scheduled appointment time, to have medication bottles and any blood sugar or blood pressure readings ready for review. The patient agreed to meet with the pharmacist via with the pharmacist via telephone visit on (date/time).  10/11/2022  Penne Lash, RMA Care Guide [facility]  [place], [place] [address] Direct Dial: [phone] [email or web address]

## 2022-09-16 NOTE — Telephone Encounter (Signed)
Propranolol 10 mg # 270 only sent to  [place], [place].

## 2022-09-21 ENCOUNTER — Telehealth: Payer: Self-pay | Admitting: Pharmacist

## 2022-09-21 NOTE — Telephone Encounter (Signed)
Patient was called to follow-up referral for medication assistance referral from Dr Nicki Reaper regarding Trelegy and Eliquis.  I was able to speak with [name]. Reviewed and updated her medication list.  She is currently in the Medicare coverage gap. She has been paying $150 per month for Eliquis 2.'5mg'$  bid. She only took Trelegy for 1 month with sample provided by Dr Duwayne Heck. Mrs. Wisdom did not feel that she noted and difference when she took Immunologist. She has documented asthma. She has albuterol inhaler to use as needed by reports she has not used albuterol in the last 6 months.  Will try to get Eliquis from medication assistance program but patient is aware that since it is so close to the end of the year she might not get approved before the 10/03/2022 deadline. She is checking with her daughter about emailing application to her daughter which will be quicker than mailing Eliquis / BMS application to patient. Patient also understands she will need to get report from [facility] with total out of pocket spend from 10/04/2021 to current date for medications.  If by chance we are not able to get her approved for medication assistance program for 2023, will place on list for follow up to assess for medication assistance program around April or May 2024.

## 2022-09-22 NOTE — Telephone Encounter (Signed)
Daugher, Ivin Booty called to provide her email for Eliquis application. Sent to her.  Patient can either return to me by secure email, fax to myself or Dr Lars Mage office or drop application + pharmacy report by the office.

## 2022-09-22 NOTE — Telephone Encounter (Signed)
Noted  

## 2022-09-24 NOTE — Telephone Encounter (Signed)
Patient called back. She states she reviewed her records and she is above the program income cut off of $43,700.  Patient is encouraged to reassess in 2024 when she reaches Medicare coverage gap as program income limits change from year to year.

## 2022-10-06 ENCOUNTER — Other Ambulatory Visit (INDEPENDENT_AMBULATORY_CARE_PROVIDER_SITE_OTHER): Payer: Medicare Other

## 2022-10-06 DIAGNOSIS — R739 Hyperglycemia, unspecified: Secondary | ICD-10-CM

## 2022-10-06 DIAGNOSIS — E7800 Pure hypercholesterolemia, unspecified: Secondary | ICD-10-CM | POA: Diagnosis not present

## 2022-10-06 DIAGNOSIS — R944 Abnormal results of kidney function studies: Secondary | ICD-10-CM

## 2022-10-06 DIAGNOSIS — D6859 Other primary thrombophilia: Secondary | ICD-10-CM | POA: Diagnosis not present

## 2022-10-06 LAB — HEPATIC FUNCTION PANEL
ALT: 14 U/L (ref 0–35)
AST: 19 U/L (ref 0–37)
Albumin: 4.4 g/dL (ref 3.5–5.2)
Alkaline Phosphatase: 60 U/L (ref 39–117)
Bilirubin, Direct: 0.1 mg/dL (ref 0.0–0.3)
Total Bilirubin: 0.5 mg/dL (ref 0.2–1.2)
Total Protein: 6.7 g/dL (ref 6.0–8.3)

## 2022-10-06 LAB — CBC WITH DIFFERENTIAL/PLATELET
Basophils Absolute: 0 10*3/uL (ref 0.0–0.1)
Basophils Relative: 1.1 % (ref 0.0–3.0)
Eosinophils Absolute: 0.2 10*3/uL (ref 0.0–0.7)
Eosinophils Relative: 4.2 % (ref 0.0–5.0)
HCT: 39.7 % (ref 36.0–46.0)
Hemoglobin: 13.3 g/dL (ref 12.0–15.0)
Lymphocytes Relative: 24.3 % (ref 12.0–46.0)
Lymphs Abs: 1.1 10*3/uL (ref 0.7–4.0)
MCHC: 33.7 g/dL (ref 30.0–36.0)
MCV: 97.2 fl (ref 78.0–100.0)
Monocytes Absolute: 0.5 10*3/uL (ref 0.1–1.0)
Monocytes Relative: 10.7 % (ref 3.0–12.0)
Neutro Abs: 2.7 10*3/uL (ref 1.4–7.7)
Neutrophils Relative %: 59.7 % (ref 43.0–77.0)
Platelets: 270 10*3/uL (ref 150.0–400.0)
RBC: 4.08 Mil/uL (ref 3.87–5.11)
RDW: 14 % (ref 11.5–15.5)
WBC: 4.6 10*3/uL (ref 4.0–10.5)

## 2022-10-06 LAB — BASIC METABOLIC PANEL
BUN: 19 mg/dL (ref 6–23)
CO2: 30 mEq/L (ref 19–32)
Calcium: 10 mg/dL (ref 8.4–10.5)
Chloride: 102 mEq/L (ref 96–112)
Creatinine, Ser: 0.88 mg/dL (ref 0.40–1.20)
GFR: 59.44 mL/min — ABNORMAL LOW (ref >60.00)
Glucose, Bld: 84 mg/dL (ref 70–99)
Potassium: 4.9 mEq/L (ref 3.5–5.1)
Sodium: 140 mEq/L (ref 135–145)

## 2022-10-06 LAB — LIPID PANEL
Cholesterol: 219 mg/dL — ABNORMAL HIGH (ref 0–200)
HDL: 63.7 mg/dL (ref >39.00)
NonHDL: 154.99
Total CHOL/HDL Ratio: 3
Triglycerides: 212 mg/dL — ABNORMAL HIGH (ref 0.0–149.0)
VLDL: 42.4 mg/dL — ABNORMAL HIGH (ref 0.0–40.0)

## 2022-10-06 LAB — HEMOGLOBIN A1C: Hgb A1c MFr Bld: 5.6 % (ref 4.6–6.5)

## 2022-10-06 LAB — LDL CHOLESTEROL, DIRECT: Direct LDL: 118 mg/dL

## 2022-10-08 ENCOUNTER — Other Ambulatory Visit: Payer: Self-pay | Admitting: General Surgery

## 2022-10-08 DIAGNOSIS — Z853 Personal history of malignant neoplasm of breast: Secondary | ICD-10-CM

## 2022-10-11 ENCOUNTER — Other Ambulatory Visit: Payer: Medicare Other | Admitting: Pharmacist

## 2022-10-14 ENCOUNTER — Telehealth: Payer: Self-pay | Admitting: Internal Medicine

## 2022-10-19 ENCOUNTER — Other Ambulatory Visit: Payer: Self-pay

## 2022-10-19 MED ORDER — SERTRALINE HCL 50 MG PO TABS: 50.0000 mg | ORAL_TABLET | Freq: Every day | ORAL | 1 refills | Status: DC

## 2022-10-19 NOTE — Telephone Encounter (Signed)
Pt need a refill on sertraline sent to [facility]

## 2022-10-19 NOTE — Telephone Encounter (Signed)
sent 

## 2022-10-25 ENCOUNTER — Other Ambulatory Visit (INDEPENDENT_AMBULATORY_CARE_PROVIDER_SITE_OTHER): Payer: Medicare Other

## 2022-10-25 ENCOUNTER — Telehealth: Payer: Self-pay | Admitting: Internal Medicine

## 2022-10-25 DIAGNOSIS — R300 Dysuria: Secondary | ICD-10-CM | POA: Diagnosis not present

## 2022-10-25 LAB — URINALYSIS, ROUTINE W REFLEX MICROSCOPIC
Bilirubin Urine: NEGATIVE
Ketones, ur: NEGATIVE
Nitrite: POSITIVE — AB
Specific Gravity, Urine: 1.015 (ref 1.000–1.030)
Urine Glucose: NEGATIVE
Urobilinogen, UA: 0.2 (ref 0.0–1.0)
pH: 6 (ref 5.0–8.0)

## 2022-10-25 NOTE — Telephone Encounter (Signed)
No lab orders found, so I will place orders under [facility] name.

## 2022-10-25 NOTE — Telephone Encounter (Signed)
FYI  - pt scheduled to see [facility] tomorrow 840am. Pt will drop off urine for UA/UCX today at 1130 am.    Pt daughter advised if symptoms worsen to utilize urgent care.

## 2022-10-25 NOTE — Telephone Encounter (Signed)
Pt daughter is returning call and pt daughter stated the pt has a possible UTI and would like to be bring in a specimen

## 2022-10-25 NOTE — Telephone Encounter (Signed)
LM for pt and for pt daughter to schedule appointment

## 2022-10-25 NOTE — Telephone Encounter (Signed)
Patient has already been scheduled for lab appt and appt with [facility] tomorrow.

## 2022-10-26 ENCOUNTER — Encounter: Payer: Self-pay | Admitting: Nurse Practitioner

## 2022-10-26 ENCOUNTER — Ambulatory Visit: Payer: Medicare Other | Admitting: Nurse Practitioner

## 2022-10-26 VITALS — BP 98/60 | HR 57 | Temp 98.6°F | Ht 65.5 in | Wt 127.6 lb

## 2022-10-26 DIAGNOSIS — N3000 Acute cystitis without hematuria: Secondary | ICD-10-CM | POA: Insufficient documentation

## 2022-10-26 MED ORDER — NITROFURANTOIN MONOHYD MACRO 100 MG PO CAPS: 100.0000 mg | ORAL_CAPSULE | Freq: Two times a day (BID) | ORAL | 0 refills | Status: DC

## 2022-10-26 NOTE — Assessment & Plan Note (Addendum)
Afebrile in office. UA consistent with infection. Will treat with Macrobid 100 BID x 5 days. Culture pending. Encouraged adequate fluid intake.

## 2022-10-26 NOTE — Patient Instructions (Addendum)
It was nice to meet you.  I have sent an antibiotic to your pharmacy. Please complete the entire course.   Please make sure you are staying adequately hydrated.

## 2022-10-26 NOTE — Progress Notes (Signed)
Tomasita Morrow, NP-C Phone: [phone]  Anita Benjamin is a 89 y.o. female who presents today for UTI symptoms. Patient brought in a urine on 10/25/2022 for a UA and culture. Patient reports symptoms began 5 days ago. She has had a low grade fever and felt fatigued.  UTI: Dysuria- Yes Frequency- Yes  Urgency- Yes  Hematuria- No  Fever- Yes Abd pain- No  Vaginal d/c- No   Social History   Tobacco Use  Smoking Status Never  Smokeless Tobacco Never    Current Outpatient Medications on File Prior to Visit  Medication Sig Dispense Refill   acetaminophen (TYLENOL) 650 MG CR tablet Take 650 mg by mouth every 8 (eight) hours as needed for pain.     amLODipine (NORVASC) 5 MG tablet TAKE 1 TABLET BY MOUTH ONCE DAILY AT DINNER TIME 30 tablet 10   apixaban (ELIQUIS) 2.5 MG TABS tablet TAKE 1 TABLET BY MOUTH TWICE DAILY 180 tablet 1   Ascorbic Acid (VITAMIN C) 250 MG CHEW Chew 250 mg by mouth daily.      calcium citrate-vitamin D 500-500 MG-UNIT chewable tablet Chew 3 tablets by mouth daily.     letrozole (FEMARA) 2.5 MG tablet Take 2.5 mg by mouth daily.     losartan (COZAAR) 100 MG tablet TAKE 1 TABLET BY MOUTH ONCE DAILY 90 tablet 1   Multiple Vitamin (MULTIVITAMIN) tablet Take 1 tablet by mouth daily.     Probiotic Product (PROBIOTIC ADVANCED PO) Take 1 capsule by mouth daily.     propranolol (INDERAL) 10 MG tablet TAKE 1 TABLET BY MOUTH 3 TIMES DAILY AS NEEDED. MAY TAKE EXTRA FOR BREAKTHROUGH HEART RATE 270 tablet 0   sertraline (ZOLOFT) 50 MG tablet Take 1 tablet (50 mg total) by mouth at bedtime. 90 tablet 1   No current facility-administered medications on file prior to visit.     ROS see history of present illness  Objective  Physical Exam Vitals:   10/26/22 0842  BP: 98/60  Pulse: (!) 57  Temp: 98.6 F (37 C)  SpO2: 95%    BP Readings from Last 3 Encounters:  10/26/22 98/60  09/07/22 126/70  09/06/22 128/73   Wt Readings from Last 3 Encounters:  10/26/22 127 lb 9.6 oz  (57.9 kg)  09/07/22 129 lb 9.6 oz (58.8 kg)  09/06/22 131 lb (59.4 kg)    Physical Exam Constitutional:      General: She is not in acute distress.    Appearance: Normal appearance.  HENT:     Head: Normocephalic.  Cardiovascular:     Rate and Rhythm: Normal rate and regular rhythm.     Heart sounds: Normal heart sounds.  Pulmonary:     Effort: Pulmonary effort is normal.     Breath sounds: Normal breath sounds.  Abdominal:     General: Abdomen is flat. Bowel sounds are normal.     Palpations: Abdomen is soft.     Tenderness: There is no abdominal tenderness.  Skin:    General: Skin is warm and dry.  Neurological:     Mental Status: She is alert.  Psychiatric:        Mood and Affect: Mood normal.        Behavior: Behavior normal.    Assessment/Plan: Please see individual problem list.  Acute cystitis without hematuria Assessment & Plan: Afebrile in office. UA consistent with infection. Will treat with Macrobid 100 BID x 5 days. Culture pending. Encouraged adequate fluid intake.  Orders: -  Nitrofurantoin Monohyd Macro; Take 1 capsule (100 mg total) by mouth 2 (two) times daily.  Dispense: 10 capsule; Refill: 0   Return if symptoms worsen or fail to improve.   Tomasita Morrow, NP-C [place]

## 2022-10-27 LAB — URINE CULTURE
MICRO NUMBER:: 14455114
SPECIMEN QUALITY:: ADEQUATE

## 2022-11-05 ENCOUNTER — Ambulatory Visit
Admission: RE | Admit: 2022-11-05 | Discharge: 2022-11-05 | Disposition: A | Discharge status: Home / Self Care | Payer: Medicare Other | Source: Ambulatory Visit | Attending: General Surgery | Admitting: General Surgery

## 2022-11-05 DIAGNOSIS — Z853 Personal history of malignant neoplasm of breast: Secondary | ICD-10-CM | POA: Insufficient documentation

## 2022-12-06 ENCOUNTER — Other Ambulatory Visit (INDEPENDENT_AMBULATORY_CARE_PROVIDER_SITE_OTHER): Payer: Medicare Other

## 2022-12-06 DIAGNOSIS — I10 Essential (primary) hypertension: Secondary | ICD-10-CM | POA: Diagnosis not present

## 2022-12-06 LAB — BASIC METABOLIC PANEL
BUN: 17 mg/dL (ref 6–23)
CO2: 30 mEq/L (ref 19–32)
Calcium: 10 mg/dL (ref 8.4–10.5)
Chloride: 102 mEq/L (ref 96–112)
Creatinine, Ser: 0.98 mg/dL (ref 0.40–1.20)
GFR: 52.17 mL/min — ABNORMAL LOW (ref >60.00)
Glucose, Bld: 91 mg/dL (ref 70–99)
Potassium: 5 mEq/L (ref 3.5–5.1)
Sodium: 141 mEq/L (ref 135–145)

## 2022-12-08 ENCOUNTER — Ambulatory Visit: Payer: Medicare Other | Admitting: Internal Medicine

## 2022-12-08 ENCOUNTER — Encounter: Payer: Self-pay | Admitting: Internal Medicine

## 2022-12-08 ENCOUNTER — Telehealth: Payer: Self-pay

## 2022-12-08 VITALS — BP 120/70 | HR 61 | Temp 98.1°F | Resp 16 | Ht 66.0 in | Wt 127.0 lb

## 2022-12-08 DIAGNOSIS — I700 Atherosclerosis of aorta: Secondary | ICD-10-CM

## 2022-12-08 DIAGNOSIS — D649 Anemia, unspecified: Secondary | ICD-10-CM

## 2022-12-08 DIAGNOSIS — R739 Hyperglycemia, unspecified: Secondary | ICD-10-CM | POA: Diagnosis not present

## 2022-12-08 DIAGNOSIS — F439 Reaction to severe stress, unspecified: Secondary | ICD-10-CM

## 2022-12-08 DIAGNOSIS — R928 Other abnormal and inconclusive findings on diagnostic imaging of breast: Secondary | ICD-10-CM

## 2022-12-08 DIAGNOSIS — E7800 Pure hypercholesterolemia, unspecified: Secondary | ICD-10-CM | POA: Diagnosis not present

## 2022-12-08 DIAGNOSIS — I10 Essential (primary) hypertension: Secondary | ICD-10-CM

## 2022-12-08 DIAGNOSIS — I4891 Unspecified atrial fibrillation: Secondary | ICD-10-CM

## 2022-12-08 DIAGNOSIS — J3489 Other specified disorders of nose and nasal sinuses: Secondary | ICD-10-CM

## 2022-12-08 DIAGNOSIS — R300 Dysuria: Secondary | ICD-10-CM

## 2022-12-08 DIAGNOSIS — R6889 Other general symptoms and signs: Secondary | ICD-10-CM

## 2022-12-08 DIAGNOSIS — J4520 Mild intermittent asthma, uncomplicated: Secondary | ICD-10-CM

## 2022-12-08 DIAGNOSIS — D6859 Other primary thrombophilia: Secondary | ICD-10-CM

## 2022-12-08 LAB — CBC WITH DIFFERENTIAL/PLATELET
Basophils Absolute: 0.1 10*3/uL (ref 0.0–0.1)
Basophils Relative: 0.8 % (ref 0.0–3.0)
Eosinophils Absolute: 0.2 10*3/uL (ref 0.0–0.7)
Eosinophils Relative: 2.3 % (ref 0.0–5.0)
HCT: 37 % (ref 36.0–46.0)
Hemoglobin: 12.5 g/dL (ref 12.0–15.0)
Lymphocytes Relative: 15.5 % (ref 12.0–46.0)
Lymphs Abs: 1.1 10*3/uL (ref 0.7–4.0)
MCHC: 33.9 g/dL (ref 30.0–36.0)
MCV: 95.6 fl (ref 78.0–100.0)
Monocytes Absolute: 0.6 10*3/uL (ref 0.1–1.0)
Monocytes Relative: 8.3 % (ref 3.0–12.0)
Neutro Abs: 5.1 10*3/uL (ref 1.4–7.7)
Neutrophils Relative %: 73.1 % (ref 43.0–77.0)
Platelets: 395 10*3/uL (ref 150.0–400.0)
RBC: 3.87 Mil/uL (ref 3.87–5.11)
RDW: 13.4 % (ref 11.5–15.5)
WBC: 7 10*3/uL (ref 4.0–10.5)

## 2022-12-08 LAB — URINALYSIS, ROUTINE W REFLEX MICROSCOPIC
Bilirubin Urine: NEGATIVE
Ketones, ur: NEGATIVE
Nitrite: NEGATIVE
Specific Gravity, Urine: 1.01 (ref 1.000–1.030)
Total Protein, Urine: NEGATIVE
Urine Glucose: NEGATIVE
Urobilinogen, UA: 0.2 (ref 0.0–1.0)
pH: 7 (ref 5.0–8.0)

## 2022-12-08 LAB — BASIC METABOLIC PANEL
BUN: 21 mg/dL (ref 6–23)
CO2: 29 mEq/L (ref 19–32)
Calcium: 10.1 mg/dL (ref 8.4–10.5)
Chloride: 99 mEq/L (ref 96–112)
Creatinine, Ser: 1.05 mg/dL (ref 0.40–1.20)
GFR: 48.03 mL/min — ABNORMAL LOW (ref >60.00)
Glucose, Bld: 81 mg/dL (ref 70–99)
Potassium: 5 mEq/L (ref 3.5–5.1)
Sodium: 136 mEq/L (ref 135–145)

## 2022-12-08 MED ORDER — MUPIROCIN 2 % EX OINT: 1.0000 | TOPICAL_OINTMENT | Freq: Two times a day (BID) | CUTANEOUS | 0 refills | Status: DC

## 2022-12-08 NOTE — Progress Notes (Signed)
Subjective:    Patient ID: Anita Benjamin, female    DOB: [birth year], 89 y.o.   MRN: [id number]  Patient here for  Chief Complaint  Patient presents with   Medical Management of Chronic Issues    HPI Here to follow up regarding afib, hypertension and hypercholesterolemia. Has been followed by Dr Bary Castilla. Saw Dr Peyton Najjar 11/12/22.  She had a short follow-up right breast diagnostic mammogram on 11/05/2022. There was suspected biopsy changes without concerning area of malignancy. Next bilateral screening mammogram on August 2024. Describes urinary frequency and some dysuria.  Some nocturia - change for her.  No hematuria.  No vomiting or diarrhea.  Has a sore in her nose.  Minimal sore throat.  Spit up some blood tinged mucus - felt like from her throat.  Denies any gross hemoptysis.  No chest congestion or sob.  No chest pain.  No abdominal pain.    Past Medical History:  Diagnosis Date   Anemia    Atrial fibrillation (HCC)    Atrial flutter (HCC)    Fibrocystic breast disease    GERD (gastroesophageal reflux disease)    History of chicken pox    Hypercholesterolemia    Hypertension    Past Surgical History:  Procedure Laterality Date   BREAST BIOPSY Right 2014   NEG   BREAST BIOPSY Right 05/24/2022   stereo bx asymmetry, x marker, benign   BREAST BIOPSY Right 05/25/2022   [place] Core BX, Coil Clip - benign   BREAST LUMPECTOMY     BREAST LUMPECTOMY WITH SENTINEL LYMPH NODE BIOPSY Left 06/04/2020   Procedure: BREAST LUMPECTOMY WITH SENTINEL LYMPH NODE BX;  Surgeon: Robert Bellow, MD;  Location: [facility] ORS;  Service: General;  Laterality: Left;   CATARACT EXTRACTION     DILATION AND CURETTAGE OF UTERUS     Laser repair of torn retina     Family History  Problem Relation Age of Onset   Heart disease Mother 60       Heart attack   Cancer Father 13       lung cancer   Breast cancer Paternal Aunt        x3   Colon cancer Neg Hx    Social History   Socioeconomic History   Marital  status: Widowed    Spouse name: Not on file   Number of children: 2   Years of education: Not on file   Highest education level: Not on file  Occupational History   Not on file  Tobacco Use   Smoking status: Never   Smokeless tobacco: Never  Vaping Use   Vaping Use: Never used  Substance and Sexual Activity   Alcohol use: No    Alcohol/week: 0.0 standard drinks of alcohol   Drug use: No   Sexual activity: Not on file  Other Topics Concern   Not on file  Social History Narrative   Not on file   Social Determinants of Health   Financial Resource Strain: Low Risk  (02/13/2018)   Overall Financial Resource Strain (CARDIA)    Difficulty of Paying Living Expenses: Not hard at all  Food Insecurity: No Food Insecurity (02/13/2018)   Hunger Vital Sign    Worried About Running Out of Food in the Last Year: Never true    [place] in the Last Year: Never true  Transportation Needs: No Transportation Needs (02/13/2018)   PRAPARE - Hydrologist (Medical): No  Lack of Transportation (Non-Medical): No  Physical Activity: Unknown (02/13/2018)   Exercise Vital Sign    Days of Exercise per Week: 0 days    Minutes of Exercise per Session: Not on file  Stress: No Stress Concern Present (02/13/2018)   [place]    Feeling of Stress : Not at all  Social Connections: Not on file     Review of Systems  Constitutional:  Negative for appetite change and fever.  HENT:  Negative for sinus pressure.        Minimal congestion.  Minimal sore throat.  Left nares - sore.   Respiratory:  Negative for cough, chest tightness and shortness of breath.   Cardiovascular:  Negative for chest pain and palpitations.  Gastrointestinal:  Negative for abdominal pain, diarrhea, nausea and vomiting.  Genitourinary:  Positive for dysuria and frequency.  Musculoskeletal:  Negative for joint swelling and myalgias.   Skin:  Negative for color change and rash.  Neurological:  Negative for dizziness and headaches.  Psychiatric/Behavioral:  Negative for agitation and dysphoric mood.        Objective:     BP 120/70   Pulse 61   Temp 98.1 F (36.7 C)   Resp 16   Ht '5\' 6"'$  (1.676 m)   Wt 127 lb (57.6 kg)   LMP 10/27/1989   SpO2 97%   BMI 20.50 kg/m  Wt Readings from Last 3 Encounters:  12/08/22 127 lb (57.6 kg)  10/26/22 127 lb 9.6 oz (57.9 kg)  09/07/22 129 lb 9.6 oz (58.8 kg)    Physical Exam Vitals reviewed.  Constitutional:      General: She is not in acute distress.    Appearance: Normal appearance.  HENT:     Head: Normocephalic and atraumatic.     Right Ear: External ear normal.     Left Ear: External ear normal.     Nose:     Comments: Sore - left nares.  Eyes:     General: No scleral icterus.       Right eye: No discharge.        Left eye: No discharge.     Conjunctiva/sclera: Conjunctivae normal.  Neck:     Thyroid: No thyromegaly.  Cardiovascular:     Rate and Rhythm: Normal rate and regular rhythm.  Pulmonary:     Effort: No respiratory distress.     Breath sounds: Normal breath sounds. No wheezing.  Abdominal:     General: Bowel sounds are normal.     Palpations: Abdomen is soft.     Tenderness: There is no abdominal tenderness.  Musculoskeletal:        General: No swelling or tenderness.     Cervical back: Neck supple. No tenderness.  Lymphadenopathy:     Cervical: No cervical adenopathy.  Skin:    Findings: No erythema or rash.  Neurological:     Mental Status: She is alert.  Psychiatric:        Mood and Affect: Mood normal.        Behavior: Behavior normal.      Outpatient Encounter Medications as of 12/08/2022  Medication Sig   mupirocin ointment (BACTROBAN) 2 % Apply 1 Application topically 2 (two) times daily.   acetaminophen (TYLENOL) 650 MG CR tablet Take 650 mg by mouth every 8 (eight) hours as needed for pain.   amLODipine (NORVASC) 5 MG  tablet TAKE 1 TABLET BY MOUTH ONCE DAILY AT [facility]  TIME   apixaban (ELIQUIS) 2.5 MG TABS tablet TAKE 1 TABLET BY MOUTH TWICE DAILY   Ascorbic Acid (VITAMIN C) 250 MG CHEW Chew 250 mg by mouth daily.    calcium citrate-vitamin D 500-500 MG-UNIT chewable tablet Chew 3 tablets by mouth daily.   letrozole (FEMARA) 2.5 MG tablet Take 2.5 mg by mouth daily.   losartan (COZAAR) 100 MG tablet TAKE 1 TABLET BY MOUTH ONCE DAILY   Multiple Vitamin (MULTIVITAMIN) tablet Take 1 tablet by mouth daily.   Probiotic Product (PROBIOTIC ADVANCED PO) Take 1 capsule by mouth daily.   propranolol (INDERAL) 10 MG tablet TAKE 1 TABLET BY MOUTH 3 TIMES DAILY AS NEEDED. MAY TAKE EXTRA FOR BREAKTHROUGH HEART RATE   sertraline (ZOLOFT) 50 MG tablet Take 1 tablet (50 mg total) by mouth at bedtime.   [DISCONTINUED] nitrofurantoin, macrocrystal-monohydrate, (MACROBID) 100 MG capsule Take 1 capsule (100 mg total) by mouth 2 (two) times daily.   No facility-administered encounter medications on file as of 12/08/2022.     Lab Results  Component Value Date   WBC 7.0 12/08/2022   HGB 12.5 12/08/2022   HCT 37.0 12/08/2022   PLT 395.0 12/08/2022   GLUCOSE 81 12/08/2022   CHOL 219 (H) 10/06/2022   TRIG 212.0 (H) 10/06/2022   HDL 63.70 10/06/2022   LDLDIRECT 118.0 10/06/2022   LDLCALC 110 (H) 09/02/2022   ALT 14 10/06/2022   AST 19 10/06/2022   NA 136 12/08/2022   K 5.0 12/08/2022   CL 99 12/08/2022   CREATININE 1.05 12/08/2022   BUN 21 12/08/2022   CO2 29 12/08/2022   TSH 3.42 04/28/2022   HGBA1C 5.6 10/06/2022    MM DIAG BREAST TOMO UNI RIGHT  Result Date: 11/05/2022 CLINICAL DATA:  Patient for follow-up after benign right breast biopsies. EXAM: DIGITAL DIAGNOSTIC UNILATERAL RIGHT MAMMOGRAM WITH TOMOSYNTHESIS TECHNIQUE: Right digital diagnostic mammography and breast tomosynthesis was performed. COMPARISON:  Previous exam(s). ACR Breast Density Category c: The breast tissue is heterogeneously dense, which may  obscure small masses. FINDINGS: Stable positioning of the biopsy marking clips within the right breast. Stable asymmetry within the inferior right breast. No new masses, calcifications or nonsurgical distortion identified. IMPRESSION: No mammographic evidence for malignancy. Stable biopsy changes right breast. RECOMMENDATION: Return to annual screening mammography 05/2023 I have discussed the findings and recommendations with the patient. If applicable, a reminder letter will be sent to the patient regarding the next appointment. BI-RADS CATEGORY  2: Benign. Electronically Signed   By: Lovey Newcomer M.D.   On: 11/05/2022 14:20      Assessment & Plan:  Essential hypertension Assessment & Plan: Continue amlodipine and losartan.  Blood pressure as outlined.  Follow pressures.  Follow metabolic panel.   Orders: -     Basic metabolic panel -     CBC with Differential/Platelet  Hypercholesterolemia Assessment & Plan: Off cholesterol medication. Declines to start cholesterol medication.  Follow lipid panel.   Orders: -     Lipid panel; Future -     Hepatic function panel; Future  Hyperglycemia Assessment & Plan: Follow met b and a1c.   Orders: -     Hemoglobin A1c; Future  Dysuria Assessment & Plan: With increased urinary frequency and nocturia with associated dysuria - recheck urine to confirm on infection.    Orders: -     Urinalysis, Routine w reflex microscopic -     Urine Culture  Abnormal mammogram Assessment & Plan: Has been followed by Dr Bary Castilla. Saw Dr  Cintron 11/12/22.  She had a short follow-up right breast diagnostic mammogram on 11/05/2022. There was suspected biopsy changes without concerning area of malignancy. Next bilateral screening mammogram on August 2024.   Anemia, unspecified type Assessment & Plan: Follow cbc.    Aortic atherosclerosis ([place]) Assessment & Plan: Have discussed cholesterol medication.  Follow.     Mild intermittent asthma without  complication Assessment & Plan: Saw Dr Patsey Berthold - 04/2022 - Recommend trial of Trelegy Ellipta 1 puff daily. Also encouraged to use albuterol as needed.  She was doing well with trelegy.  Breathing - better. Cough - better.    Atrial fibrillation, unspecified type [facility]) Assessment & Plan: Appears to be in SR on exam.  Continues on eliquis.     Hemochromatosis, hereditary ([place]) Assessment & Plan: Continue to follow iron studies annually.  Saw hematology.     Stress Assessment & Plan: Continue zoloft.  Stable.    Thrombophilia ([place]) Assessment & Plan: afib on eliquis.    Internal nasal lesion Assessment & Plan: Bactroban as directed.  Follow.    Congestion of throat Assessment & Plan: Saline nasal spray.  Bactroban as directed.  No gross hemoptysis.  Follow.  Call with update.    Other orders -     Mupirocin; Apply 1 Application topically 2 (two) times daily.  Dispense: 22 g; Refill: 0     Einar Pheasant, MD

## 2022-12-08 NOTE — Telephone Encounter (Signed)
1 box of Trelegy sample given to patient at 12/08/2022 visit.  Trelegy Elipta 100-62.5-25 LOT [id number] EXP 01/2024

## 2022-12-09 LAB — URINE CULTURE
MICRO NUMBER:: 14657060
SPECIMEN QUALITY:: ADEQUATE

## 2022-12-10 NOTE — Telephone Encounter (Signed)
Called daughter to discuss and explain urine culture came back negative.

## 2022-12-12 ENCOUNTER — Encounter: Payer: Self-pay | Admitting: Internal Medicine

## 2022-12-12 DIAGNOSIS — R6889 Other general symptoms and signs: Secondary | ICD-10-CM | POA: Insufficient documentation

## 2022-12-12 DIAGNOSIS — J3489 Other specified disorders of nose and nasal sinuses: Secondary | ICD-10-CM | POA: Insufficient documentation

## 2022-12-12 NOTE — Assessment & Plan Note (Signed)
Saline nasal spray.  Bactroban as directed.  No gross hemoptysis.  Follow.  Call with update.

## 2022-12-12 NOTE — Assessment & Plan Note (Signed)
With increased urinary frequency and nocturia with associated dysuria - recheck urine to confirm on infection.

## 2022-12-12 NOTE — Assessment & Plan Note (Signed)
afib on eliquis.  

## 2022-12-12 NOTE — Assessment & Plan Note (Signed)
Has been followed by Dr Bary Castilla. Saw Dr Peyton Najjar 11/12/22.  She had a short follow-up right breast diagnostic mammogram on 11/05/2022. There was suspected biopsy changes without concerning area of malignancy. Next bilateral screening mammogram on August 2024.

## 2022-12-12 NOTE — Assessment & Plan Note (Addendum)
Off cholesterol medication. Declines to start cholesterol medication.  Follow lipid panel.

## 2022-12-12 NOTE — Assessment & Plan Note (Signed)
Saw Dr Patsey Berthold - 04/2022 - Recommend trial of Trelegy Ellipta 1 puff daily. Also encouraged to use albuterol as needed.  She was doing well with trelegy.  Breathing - better. Cough - better.

## 2022-12-12 NOTE — Assessment & Plan Note (Signed)
Have discussed cholesterol medication.  Follow.   ?

## 2022-12-12 NOTE — Assessment & Plan Note (Signed)
Follow cbc.  

## 2022-12-12 NOTE — Assessment & Plan Note (Signed)
Continue to follow iron studies annually.  Saw hematology.   

## 2022-12-12 NOTE — Assessment & Plan Note (Signed)
Bactroban as directed.  Follow.  

## 2022-12-12 NOTE — Assessment & Plan Note (Signed)
Appears to be in SR on exam.  Continues on eliquis.   

## 2022-12-12 NOTE — Assessment & Plan Note (Signed)
Continue amlodipine and losartan.  Blood pressure as outlined.  Follow pressures.  Follow metabolic panel.  

## 2022-12-12 NOTE — Assessment & Plan Note (Signed)
Follow met b and a1c.  

## 2022-12-12 NOTE — Assessment & Plan Note (Signed)
Continue zoloft.  Stable.  

## 2023-01-21 ENCOUNTER — Encounter: Payer: Self-pay | Admitting: Pulmonary Disease

## 2023-02-03 ENCOUNTER — Other Ambulatory Visit: Payer: Self-pay | Admitting: Cardiovascular Disease

## 2023-02-03 DIAGNOSIS — I4891 Unspecified atrial fibrillation: Secondary | ICD-10-CM

## 2023-02-03 NOTE — Telephone Encounter (Signed)
Please schedule overdue F/U appointment for further refills. Thank you! 

## 2023-02-04 NOTE — Telephone Encounter (Signed)
Pt is scheduled to see G[facility]an on 7/30.

## 2023-02-04 NOTE — Telephone Encounter (Signed)
Refill request

## 2023-02-04 NOTE — Telephone Encounter (Signed)
Prescription refill request for Eliquis received. Indication: Afib  Last office visit: 04/09/22 Anita Benjamin)  Scr: 1.05 (12/08/22)  Age: 89 Weight: 57.6kg  Appropriate dose. Refill sent.

## 2023-03-02 ENCOUNTER — Encounter: Payer: Self-pay | Admitting: Internal Medicine

## 2023-03-02 ENCOUNTER — Ambulatory Visit: Payer: Medicare Other | Admitting: Internal Medicine

## 2023-03-02 VITALS — BP 130/70 | HR 60 | Temp 98.2°F | Resp 16 | Ht 66.0 in | Wt 129.2 lb

## 2023-03-02 DIAGNOSIS — R928 Other abnormal and inconclusive findings on diagnostic imaging of breast: Secondary | ICD-10-CM

## 2023-03-02 DIAGNOSIS — J4520 Mild intermittent asthma, uncomplicated: Secondary | ICD-10-CM

## 2023-03-02 DIAGNOSIS — R6889 Other general symptoms and signs: Secondary | ICD-10-CM | POA: Insufficient documentation

## 2023-03-02 DIAGNOSIS — R739 Hyperglycemia, unspecified: Secondary | ICD-10-CM | POA: Diagnosis not present

## 2023-03-02 DIAGNOSIS — E7800 Pure hypercholesterolemia, unspecified: Secondary | ICD-10-CM | POA: Diagnosis not present

## 2023-03-02 DIAGNOSIS — F439 Reaction to severe stress, unspecified: Secondary | ICD-10-CM

## 2023-03-02 DIAGNOSIS — I4891 Unspecified atrial fibrillation: Secondary | ICD-10-CM

## 2023-03-02 DIAGNOSIS — D6859 Other primary thrombophilia: Secondary | ICD-10-CM

## 2023-03-02 DIAGNOSIS — C50919 Malignant neoplasm of unspecified site of unspecified female breast: Secondary | ICD-10-CM

## 2023-03-02 DIAGNOSIS — I10 Essential (primary) hypertension: Secondary | ICD-10-CM | POA: Diagnosis not present

## 2023-03-02 DIAGNOSIS — D649 Anemia, unspecified: Secondary | ICD-10-CM

## 2023-03-02 DIAGNOSIS — I700 Atherosclerosis of aorta: Secondary | ICD-10-CM

## 2023-03-02 MED ORDER — ROSUVASTATIN CALCIUM 5 MG PO TABS: 5.0000 mg | ORAL_TABLET | Freq: Every day | ORAL | 2 refills | Status: DC

## 2023-03-02 NOTE — Progress Notes (Signed)
Subjective:    Patient ID: Anita Benjamin, female    DOB: 06-11-1934, 89 y.o.   MRN: [id number]  Patient here for  Chief Complaint  Patient presents with   Medical Management of Chronic Issues    HPI Here to follow up regarding afib, hypertension and hypercholesterolemia. Has been followed by Dr Lemar Livings. Saw Dr Maia Plan 11/12/22.  She had a short follow-up right breast diagnostic mammogram on 11/05/2022. There was suspected biopsy changes without concerning area of malignancy. Next bilateral screening mammogram on August 2024. Reports she is doing well.  Breathing stable. Using Trelegy. No increased cough or congestion.  No acid reflux.  No abdominal pain or cramping.  No bowel change reported.  On Zoloft. Blood pressures 130/132/70s. Discussed starting cholesterol medication.    Past Medical History:  Diagnosis Date   Anemia    Atrial fibrillation (HCC)    Atrial flutter (HCC)    Fibrocystic breast disease    GERD (gastroesophageal reflux disease)    History of chicken pox    Hypercholesterolemia    Hypertension    Past Surgical History:  Procedure Laterality Date   BREAST BIOPSY Right 2014   NEG   BREAST BIOPSY Right 05/24/2022   stereo bx asymmetry, x marker, benign   BREAST BIOPSY Right 05/25/2022   [place] Core BX, Coil Clip - benign   BREAST LUMPECTOMY     BREAST LUMPECTOMY WITH SENTINEL LYMPH NODE BIOPSY Left 06/04/2020   Procedure: BREAST LUMPECTOMY WITH SENTINEL LYMPH NODE BX;  Surgeon: Earline Mayotte, MD;  Location: [facility] ORS;  Service: General;  Laterality: Left;   CATARACT EXTRACTION     DILATION AND CURETTAGE OF UTERUS     Laser repair of torn retina     Family History  Problem Relation Age of Onset   Heart disease Mother 93       Heart attack   Cancer Father 24       lung cancer   Breast cancer Paternal Aunt        x3   Colon cancer Neg Hx    Social History   Socioeconomic History   Marital status: Widowed    Spouse name: Not on file   Number of children: 2    Years of education: Not on file   Highest education level: Not on file  Occupational History   Not on file  Tobacco Use   Smoking status: Never   Smokeless tobacco: Never  Vaping Use   Vaping Use: Never used  Substance and Sexual Activity   Alcohol use: No    Alcohol/week: 0.0 standard drinks of alcohol   Drug use: No   Sexual activity: Not on file  Other Topics Concern   Not on file  Social History Narrative   Not on file   Social Determinants of Health   Financial Resource Strain: Low Risk  (02/13/2018)   Overall Financial Resource Strain (CARDIA)    Difficulty of Paying Living Expenses: Not hard at all  Food Insecurity: No Food Insecurity (02/13/2018)   Hunger Vital Sign    Worried About Running Out of Food in the Last Year: Never true    Ran Out of Food in the Last Year: Never true  Transportation Needs: No Transportation Needs (02/13/2018)   PRAPARE - Administrator, Civil Service (Medical): No    Lack of Transportation (Non-Medical): No  Physical Activity: Unknown (02/13/2018)   Exercise Vital Sign    Days of Exercise per Week: 0  days    Minutes of Exercise per Session: Not on file  Stress: No Stress Concern Present (02/13/2018)   [employer] of Occupational Health - Occupational Stress Questionnaire    Feeling of Stress : Not at all  Social Connections: Not on file     Review of Systems  Constitutional:  Negative for appetite change and unexpected weight change.  HENT:  Negative for congestion and sinus pressure.   Respiratory:  Negative for cough, chest tightness and shortness of breath.   Cardiovascular:  Negative for chest pain and palpitations.  Gastrointestinal:  Negative for abdominal pain, diarrhea, nausea and vomiting.  Genitourinary:  Negative for difficulty urinating and dysuria.  Musculoskeletal:  Negative for joint swelling and myalgias.  Skin:  Negative for color change and rash.  Neurological:  Negative for dizziness and  headaches.  Psychiatric/Behavioral:  Negative for agitation and dysphoric mood.        Objective:     BP 130/70   Pulse 60   Temp 98.2 F (36.8 C)   Resp 16   Ht 5\' 6"  (1.676 m)   Wt 129 lb 3.2 oz (58.6 kg)   LMP 10/27/1989   SpO2 97%   BMI 20.85 kg/m  Wt Readings from Last 3 Encounters:  03/02/23 129 lb 3.2 oz (58.6 kg)  12/08/22 127 lb (57.6 kg)  10/26/22 127 lb 9.6 oz (57.9 kg)    Physical Exam Vitals reviewed.  Constitutional:      General: She is not in acute distress.    Appearance: Normal appearance.  HENT:     Head: Normocephalic and atraumatic.     Right Ear: External ear normal.     Left Ear: External ear normal.  Eyes:     General: No scleral icterus.       Right eye: No discharge.        Left eye: No discharge.     Conjunctiva/sclera: Conjunctivae normal.  Neck:     Thyroid: No thyromegaly.  Cardiovascular:     Rate and Rhythm: Normal rate and regular rhythm.  Pulmonary:     Effort: No respiratory distress.     Breath sounds: Normal breath sounds. No wheezing.  Abdominal:     General: Bowel sounds are normal.     Palpations: Abdomen is soft.     Tenderness: There is no abdominal tenderness.  Musculoskeletal:        General: No swelling or tenderness.     Cervical back: Neck supple. No tenderness.  Lymphadenopathy:     Cervical: No cervical adenopathy.  Skin:    Findings: No erythema or rash.  Neurological:     Mental Status: She is alert.  Psychiatric:        Mood and Affect: Mood normal.        Behavior: Behavior normal.      Outpatient Encounter Medications as of 03/02/2023  Medication Sig   Cranberry-Vitamin C-Probiotic (AZO CRANBERRY PO) Take 1 tablet by mouth daily.   rosuvastatin (CRESTOR) 5 MG tablet Take 1 tablet (5 mg total) by mouth daily.   acetaminophen (TYLENOL) 650 MG CR tablet Take 650 mg by mouth every 8 (eight) hours as needed for pain.   Ascorbic Acid (VITAMIN C) 250 MG CHEW Chew 250 mg by mouth daily.    calcium  citrate-vitamin D 500-500 MG-UNIT chewable tablet Chew 3 tablets by mouth daily.   ELIQUIS 2.5 MG TABS tablet TAKE 1 TABLET BY MOUTH TWICE DAILY   letrozole (FEMARA) 2.5 MG  tablet Take 2.5 mg by mouth daily.   losartan (COZAAR) 100 MG tablet TAKE 1 TABLET BY MOUTH ONCE DAILY   Multiple Vitamin (MULTIVITAMIN) tablet Take 1 tablet by mouth daily.   mupirocin ointment (BACTROBAN) 2 % Apply 1 Application topically 2 (two) times daily.   Probiotic Product (PROBIOTIC ADVANCED PO) Take 1 capsule by mouth daily.   propranolol (INDERAL) 10 MG tablet TAKE 1 TABLET BY MOUTH 3 TIMES DAILY AS NEEDED. MAY TAKE EXTRA FOR BREAKTHROUGH HEART RATE   sertraline (ZOLOFT) 50 MG tablet Take 1 tablet (50 mg total) by mouth at bedtime.   [DISCONTINUED] amLODipine (NORVASC) 5 MG tablet TAKE 1 TABLET BY MOUTH ONCE DAILY AT DINNER TIME   No facility-administered encounter medications on file as of 03/02/2023.     Lab Results  Component Value Date   WBC 7.0 12/08/2022   HGB 12.5 12/08/2022   HCT 37.0 12/08/2022   PLT 395.0 12/08/2022   GLUCOSE 81 12/08/2022   CHOL 219 (H) 10/06/2022   TRIG 212.0 (H) 10/06/2022   HDL 63.70 10/06/2022   LDLDIRECT 118.0 10/06/2022   LDLCALC 110 (H) 09/02/2022   ALT 14 10/06/2022   AST 19 10/06/2022   NA 136 12/08/2022   K 5.0 12/08/2022   CL 99 12/08/2022   CREATININE 1.05 12/08/2022   BUN 21 12/08/2022   CO2 29 12/08/2022   TSH 3.42 04/28/2022   HGBA1C 5.6 10/06/2022    MM DIAG BREAST TOMO UNI RIGHT  Result Date: 11/05/2022 CLINICAL DATA:  Patient for follow-up after benign right breast biopsies. EXAM: DIGITAL DIAGNOSTIC UNILATERAL RIGHT MAMMOGRAM WITH TOMOSYNTHESIS TECHNIQUE: Right digital diagnostic mammography and breast tomosynthesis was performed. COMPARISON:  Previous exam(s). ACR Breast Density Category c: The breast tissue is heterogeneously dense, which may obscure small masses. FINDINGS: Stable positioning of the biopsy marking clips within the right breast. Stable  asymmetry within the inferior right breast. No new masses, calcifications or nonsurgical distortion identified. IMPRESSION: No mammographic evidence for malignancy. Stable biopsy changes right breast. RECOMMENDATION: Return to annual screening mammography 05/2023 I have discussed the findings and recommendations with the patient. If applicable, a reminder letter will be sent to the patient regarding the next appointment. BI-RADS CATEGORY  2: Benign. Electronically Signed   By: Annia Belt M.D.   On: 11/05/2022 14:20      Assessment & Plan:  Atrial fibrillation, unspecified type [facility]) Assessment & Plan: Appears to be in SR on exam.  Continues on eliquis.    Orders: -     TSH; Future  Hypercholesterolemia Assessment & Plan: Discussed cholesterol medication.  Agreeable to start crestor.  Follow lipid panel and liver function tests.   Orders: -     CBC with Differential/Platelet; Future -     Hepatic function panel; Future -     Lipid panel; Future  Hyperglycemia Assessment & Plan: Follow met b and a1c.   Orders: -     Hemoglobin A1c; Future  Essential hypertension Assessment & Plan: Continue amlodipine and losartan.  Blood pressure as outlined.  Follow pressures.  Follow metabolic panel.   Orders: -     Basic metabolic panel; Future  Cold intolerance  Abnormal mammogram Assessment & Plan: Has been followed by Dr Lemar Livings. Saw Dr Maia Plan 11/12/22.  She had a short follow-up right breast diagnostic mammogram on 11/05/2022. There was suspected biopsy changes without concerning area of malignancy. Next bilateral screening mammogram on August 2024.   Anemia, unspecified type Assessment & Plan: Follow cbc.  Aortic atherosclerosis (HCC) Assessment & Plan: Have discussed cholesterol medication.  Agreeable to start.  Start crestor.  Follow.    Mild intermittent asthma without complication Assessment & Plan: Saw Dr Jayme Cloud - 04/2022 - Recommend trial of Trelegy Ellipta 1 puff  daily. Also encouraged to use albuterol as needed.  She was doing well with trelegy.  Breathing - better. Cough - better.    Malignant neoplasm of female breast, unspecified estrogen receptor status, unspecified laterality, unspecified site of breast [facility]) Assessment & Plan: Saw Dr Lemar Livings - 05/2021.  Recommended f/u diagnostic bilateral mammogram in one year. Mammogram 05/2022 - f/u views.  Biopsy - ok. Recommended f/u in 6 months. Continue femara. Saw Dr Maia Plan 11/12/22.  She had a short follow-up right breast diagnostic mammogram on 11/05/2022. There was suspected biopsy changes without concerning area of malignancy. Next bilateral screening mammogram on August 2024.   Hemochromatosis, hereditary (HCC) Assessment & Plan: Continue to follow iron studies annually.  Saw hematology.     Thrombophilia (HCC) Assessment & Plan: afib on eliquis.    Stress Assessment & Plan: Continue zoloft.  Stable.    Other orders -     Rosuvastatin Calcium; Take 1 tablet (5 mg total) by mouth daily.  Dispense: 30 tablet; Refill: 2     Dale [place], MD

## 2023-03-04 ENCOUNTER — Other Ambulatory Visit: Payer: Self-pay | Admitting: Cardiovascular Disease

## 2023-03-06 ENCOUNTER — Encounter: Payer: Self-pay | Admitting: Internal Medicine

## 2023-03-06 NOTE — Assessment & Plan Note (Signed)
Follow cbc.  

## 2023-03-06 NOTE — Assessment & Plan Note (Signed)
Discussed cholesterol medication.  Agreeable to start crestor.  Follow lipid panel and liver function tests.

## 2023-03-06 NOTE — Assessment & Plan Note (Addendum)
Have discussed cholesterol medication.  Agreeable to start.  Start crestor.  Follow.

## 2023-03-06 NOTE — Assessment & Plan Note (Signed)
Appears to be in SR on exam.  Continues on eliquis.   

## 2023-03-06 NOTE — Assessment & Plan Note (Signed)
afib on eliquis.  

## 2023-03-06 NOTE — Assessment & Plan Note (Signed)
Has been followed by Dr Byrnett. Saw Dr Cintron 11/12/22.  She had a short follow-up right breast diagnostic mammogram on 11/05/2022. There was suspected biopsy changes without concerning area of malignancy. Next bilateral screening mammogram on August 2024. 

## 2023-03-06 NOTE — Assessment & Plan Note (Signed)
Saw Dr Lemar Livings - 05/2021.  Recommended f/u diagnostic bilateral mammogram in one year. Mammogram 05/2022 - f/u views.  Biopsy - ok. Recommended f/u in 6 months. Continue femara. Saw Dr Maia Plan 11/12/22.  She had a short follow-up right breast diagnostic mammogram on 11/05/2022. There was suspected biopsy changes without concerning area of malignancy. Next bilateral screening mammogram on August 2024.

## 2023-03-06 NOTE — Assessment & Plan Note (Signed)
Continue to follow iron studies annually.  Saw hematology.   ?

## 2023-03-06 NOTE — Assessment & Plan Note (Signed)
Continue amlodipine and losartan.  Blood pressure as outlined.  Follow pressures.  Follow metabolic panel.  

## 2023-03-06 NOTE — Assessment & Plan Note (Signed)
Saw Dr Gonzalez - 04/2022 - Recommend trial of Trelegy Ellipta 1 puff daily. Also encouraged to use albuterol as needed.  She was doing well with trelegy.  Breathing - better. Cough - better.  

## 2023-03-06 NOTE — Assessment & Plan Note (Signed)
Follow met b and a1c.  

## 2023-03-06 NOTE — Assessment & Plan Note (Signed)
Continue zoloft.  Stable.  

## 2023-03-18 ENCOUNTER — Other Ambulatory Visit: Payer: Self-pay | Admitting: General Surgery

## 2023-03-18 DIAGNOSIS — Z1231 Encounter for screening mammogram for malignant neoplasm of breast: Secondary | ICD-10-CM

## 2023-03-30 ENCOUNTER — Other Ambulatory Visit (INDEPENDENT_AMBULATORY_CARE_PROVIDER_SITE_OTHER): Payer: Medicare Other

## 2023-03-30 DIAGNOSIS — I4891 Unspecified atrial fibrillation: Secondary | ICD-10-CM | POA: Diagnosis not present

## 2023-03-30 DIAGNOSIS — E7800 Pure hypercholesterolemia, unspecified: Secondary | ICD-10-CM

## 2023-03-30 DIAGNOSIS — R739 Hyperglycemia, unspecified: Secondary | ICD-10-CM | POA: Diagnosis not present

## 2023-03-30 DIAGNOSIS — I10 Essential (primary) hypertension: Secondary | ICD-10-CM | POA: Diagnosis not present

## 2023-03-30 LAB — CBC WITH DIFFERENTIAL/PLATELET
Basophils Absolute: 0.1 10*3/uL (ref 0.0–0.1)
Basophils Relative: 1.2 % (ref 0.0–3.0)
Eosinophils Absolute: 0.2 10*3/uL (ref 0.0–0.7)
Eosinophils Relative: 5.1 % — ABNORMAL HIGH (ref 0.0–5.0)
HCT: 40.3 % (ref 36.0–46.0)
Hemoglobin: 13.2 g/dL (ref 12.0–15.0)
Lymphocytes Relative: 22.2 % (ref 12.0–46.0)
Lymphs Abs: 0.9 10*3/uL (ref 0.7–4.0)
MCHC: 32.8 g/dL (ref 30.0–36.0)
MCV: 97.1 fl (ref 78.0–100.0)
Monocytes Absolute: 0.4 10*3/uL (ref 0.1–1.0)
Monocytes Relative: 9 % (ref 3.0–12.0)
Neutro Abs: 2.6 10*3/uL (ref 1.4–7.7)
Neutrophils Relative %: 62.5 % (ref 43.0–77.0)
Platelets: 242 10*3/uL (ref 150.0–400.0)
RBC: 4.15 Mil/uL (ref 3.87–5.11)
RDW: 13.5 % (ref 11.5–15.5)
WBC: 4.2 10*3/uL (ref 4.0–10.5)

## 2023-03-30 LAB — HEPATIC FUNCTION PANEL
ALT: 13 U/L (ref 0–35)
AST: 21 U/L (ref 0–37)
Albumin: 4.3 g/dL (ref 3.5–5.2)
Alkaline Phosphatase: 53 U/L (ref 39–117)
Bilirubin, Direct: 0.1 mg/dL (ref 0.0–0.3)
Total Bilirubin: 0.6 mg/dL (ref 0.2–1.2)
Total Protein: 7.1 g/dL (ref 6.0–8.3)

## 2023-03-30 LAB — LIPID PANEL
Cholesterol: 138 mg/dL (ref 0–200)
HDL: 67.4 mg/dL (ref >39.00)
LDL Cholesterol: 50 mg/dL (ref 0–99)
NonHDL: 70.91
Total CHOL/HDL Ratio: 2
Triglycerides: 106 mg/dL (ref 0.0–149.0)
VLDL: 21.2 mg/dL (ref 0.0–40.0)

## 2023-03-30 LAB — BASIC METABOLIC PANEL
BUN: 20 mg/dL (ref 6–23)
CO2: 30 mEq/L (ref 19–32)
Calcium: 10 mg/dL (ref 8.4–10.5)
Chloride: 103 mEq/L (ref 96–112)
Creatinine, Ser: 0.96 mg/dL (ref 0.40–1.20)
GFR: 53.36 mL/min — ABNORMAL LOW (ref >60.00)
Glucose, Bld: 100 mg/dL — ABNORMAL HIGH (ref 70–99)
Potassium: 4.2 mEq/L (ref 3.5–5.1)
Sodium: 140 mEq/L (ref 135–145)

## 2023-03-30 LAB — HEMOGLOBIN A1C: Hgb A1c MFr Bld: 5.6 % (ref 4.6–6.5)

## 2023-03-30 LAB — TSH: TSH: 2.81 u[IU]/mL (ref 0.35–5.50)

## 2023-03-31 ENCOUNTER — Telehealth: Payer: Self-pay

## 2023-03-31 NOTE — Telephone Encounter (Signed)
-----   Message from Dale Harborton, MD sent at 03/30/2023 11:01 PM EDT ----- Notify Ms Srey that her kidney function has improved from last check. Cholesterol levels have improved as well and look good. Overall sugar control wnl.  Hgb, thyroid test and liver function tests are wnl.

## 2023-03-31 NOTE — Telephone Encounter (Signed)
LMTCB in regards to lab results.  

## 2023-04-01 NOTE — Telephone Encounter (Signed)
Pt returned Shanda Bumps CMA call. Note below was read to her. Pt aware and understood.

## 2023-04-01 NOTE — Telephone Encounter (Signed)
Noted  

## 2023-04-19 ENCOUNTER — Encounter: Payer: Self-pay | Admitting: Pulmonary Disease

## 2023-04-19 ENCOUNTER — Ambulatory Visit: Payer: Medicare Other | Admitting: Pulmonary Disease

## 2023-04-19 VITALS — BP 140/58 | HR 58 | Temp 98.0°F | Ht 66.0 in | Wt 127.8 lb

## 2023-04-19 DIAGNOSIS — R053 Chronic cough: Secondary | ICD-10-CM | POA: Diagnosis not present

## 2023-04-19 DIAGNOSIS — J4540 Moderate persistent asthma, uncomplicated: Secondary | ICD-10-CM | POA: Diagnosis not present

## 2023-04-19 LAB — NITRIC OXIDE: Nitric Oxide: 21

## 2023-04-19 MED ORDER — FLUTICASONE-SALMETEROL 100-50 MCG/ACT IN AEPB: 1.0000 | INHALATION_SPRAY | Freq: Two times a day (BID) | RESPIRATORY_TRACT | 11 refills | Status: DC

## 2023-04-19 NOTE — Progress Notes (Signed)
Subjective:    Patient ID: Anita Benjamin, female    DOB: [birth year], 89 y.o.   MRN: [id number]  Patient Care Team: Dale [place], MD as PCP - General (Internal Medicine) Antonieta Iba, MD as PCP - Cardiology (Cardiology) Lemar Livings Merrily Pew, MD as Consulting Physician (General Surgery) Antonieta Iba, MD as Consulting Physician (Cardiology) Carmina Miller, MD as Referring Physician (Radiation Oncology)  Chief Complaint  Patient presents with   Follow-up    No problems.   HPI Patient is an 89 year old lifelong never smoker (albeit with significant secondhand smoke exposure) who presents for follow-up on the issue of cough and shortness of breath.  She was last seen here on 13 July 2022.  At that time she was instructed to use albuterol as needed as she was reluctant to use a maintenance inhaler.  She has changes consistent with asthma with airway remodeling.  I have explained to her that her symptoms of cough and shortness of breath are likely due to poorly compensated asthma.  I have urged her to try the inhalers as prescribed.  Of note the patient is also on letrozole for breast cancer therapy and this also can cause cough.  As noted she is not using as needed albuterol and discontinued use of Trelegy previously provided to her on her own.    She does not endorse any fevers, chills or sweats.  No chest pain, no orthopnea, paroxysmal nocturnal dyspnea or lower extremity edema.  No calf tenderness.  Shortness of breath occurs mostly during exertion. Cough has not change in character or frequency.   No other symptomatology endorsed.   DATA 11/20/2021 PFTs: FEV1 1.6 L or 64% predicted, FVC 2.26 L or 85% of predicted, FEV1/FVC 56%, there is significant bronchodilator response with a net change of 14% and FEV1.  Lung volumes were reduced however the test was not reproducible for lung volumes or diffusion capacity.  On the effort independent portion of the curve small airways component is  noted.  Diffusion capacity mildly reduced not corrected for hemoglobin.  Overall patient has changes consistent with asthma.  Review of Systems A 10 point review of systems was performed and it is as noted above otherwise negative.   Patient Active Problem List   Diagnosis Date Noted   Cold intolerance 03/02/2023   Internal nasal lesion 12/12/2022   Congestion of throat 12/12/2022   Acute cystitis without hematuria 10/26/2022   Asthma 04/12/2022   Hyperglycemia 02/20/2022   Sinusitis, acute, maxillary 01/04/2022   Hypercalcemia 01/04/2022   Right leg swelling 12/16/2021   Foot pain, right 12/15/2021   Right ankle pain 12/15/2021   Thrombophilia (HCC) 09/12/2021   Decreased GFR 09/11/2021   Hemochromatosis, hereditary (HCC) 04/15/2021   Hemochromatosis carrier 04/05/2021   Aortic atherosclerosis (HCC) 04/05/2021   Headache 04/03/2021   Atrial fibrillation (HCC) 01/15/2021   Anemia 01/12/2021   Atrial fibrillation with RVR (HCC) 01/01/2021   Hyponatremia 01/01/2021   CAP (community acquired pneumonia) 12/25/2020   Breast cancer (HCC) 06/09/2020   Diarrhea 04/11/2019   Hair loss 06/10/2017   Fatigue 02/19/2017   Chest pain 02/12/2017   Encounter for anticoagulation discussion and counseling 10/05/2016   Cough 10/01/2016   Gas 10/27/2015   Dysuria 09/17/2015   Left hip pain 07/21/2015   Neck pain 06/09/2015   Syncope 03/25/2015   Left wrist pain 03/25/2015   SOB (shortness of breath) 12/01/2014   Abdominal discomfort 12/01/2014   Atrial flutter (HCC) 11/15/2014   Stress 11/08/2014  Difficulty sleeping 11/08/2014   Health care maintenance 11/08/2014   Adjustment disorder 10/14/2014   Tachycardia 07/17/2014   Hyperkalemia 07/17/2014   History of colonic polyps 03/09/2014   Decreased sense of taste 03/09/2014   Abnormal mammogram, unspecified 07/31/2013   Abnormal mammogram 01/20/2013   Essential hypertension 10/29/2012   Hypercholesterolemia 10/29/2012   Leukopenia  10/29/2012    Social History   Tobacco Use   Smoking status: Never   Smokeless tobacco: Never  Substance Use Topics   Alcohol use: No    Alcohol/week: 0.0 standard drinks of alcohol    Allergies  Allergen Reactions   Oruvail [Ketoprofen]    Relafen [Nabumetone]    Timentin [Ticarcillin-Pot Clavulanate]     Current Meds  Medication Sig   acetaminophen (TYLENOL) 650 MG CR tablet Take 650 mg by mouth every 8 (eight) hours as needed for pain.   Albuterol Sulfate (PROAIR RESPICLICK) 108 (90 Base) MCG/ACT AEPB Inhale 2 puffs into the lungs every 6 (six) hours as needed.   amLODipine (NORVASC) 5 MG tablet TAKE 1 TABLET BY MOUTH ONCE DAILY AT DINNER TIME   Ascorbic Acid (VITAMIN C) 250 MG CHEW Chew 250 mg by mouth daily.    calcium citrate-vitamin D 500-500 MG-UNIT chewable tablet Chew 3 tablets by mouth daily.   Cranberry-Vitamin C-Probiotic (AZO CRANBERRY PO) Take 1 tablet by mouth daily.   ELIQUIS 2.5 MG TABS tablet TAKE 1 TABLET BY MOUTH TWICE DAILY   letrozole (FEMARA) 2.5 MG tablet Take 2.5 mg by mouth daily.   losartan (COZAAR) 100 MG tablet TAKE 1 TABLET BY MOUTH ONCE DAILY   Multiple Vitamin (MULTIVITAMIN) tablet Take 1 tablet by mouth daily.   mupirocin ointment (BACTROBAN) 2 % Apply 1 Application topically 2 (two) times daily.   Probiotic Product (PROBIOTIC ADVANCED PO) Take 1 capsule by mouth daily.   propranolol (INDERAL) 10 MG tablet TAKE 1 TABLET BY MOUTH 3 TIMES DAILY AS NEEDED. MAY TAKE EXTRA FOR BREAKTHROUGH HEART RATE   rosuvastatin (CRESTOR) 5 MG tablet Take 1 tablet (5 mg total) by mouth daily.   sertraline (ZOLOFT) 50 MG tablet Take 1 tablet (50 mg total) by mouth at bedtime.    Immunization History  Administered Date(s) Administered   Fluad Quad(high Dose 65+) 06/22/2019, 07/30/2020, 06/12/2021, 07/13/2022   Influenza, High Dose Seasonal PF 06/10/2017, 09/14/2018   Influenza,inj,Quad PF,6+ Mos 06/27/2013, 07/04/2014, 06/05/2015, 05/27/2016   PFIZER(Purple  Top)SARS-COV-2 Vaccination 10/31/2019, 11/21/2019   Pneumococcal Conjugate-13 10/21/2015   Pneumococcal Polysaccharide-23 10/06/2017   Tdap 10/27/2012        Objective:     BP (!) 140/58 (BP Location: Right Arm, Patient Position: Sitting, Cuff Size: Normal)   Pulse (!) 58   Temp 98 F (36.7 C) (Oral)   Ht 5\' 6"  (1.676 m)   Wt 127 lb 12.8 oz (58 kg)   LMP 10/27/1989   SpO2 97%   BMI 20.63 kg/m   SpO2: 97 % O2 Device: None (Room air)  GENERAL: Thin woman, no acute distress, does sound nasally congested, fully ambulatory.  No conversational dyspnea. HEAD: Normocephalic, atraumatic.  EYES: Pupils equal, round, reactive to light.  No scleral icterus.  MOUTH: Nose/mouth/throat not examined due to masking requirements for COVID 19. NECK: Supple. No thyromegaly. Trachea midline. No JVD.  No adenopathy. PULMONARY: Good air entry bilaterally.  No adventitious sounds. CARDIOVASCULAR: S1 and S2. Regular rate and rhythm.  No rubs, murmurs or gallops heard. ABDOMEN: Benign. MUSCULOSKELETAL: No joint deformity, no clubbing, no edema.  NEUROLOGIC: No  focal deficit, no gait disturbance, speech is fluent. SKIN: Intact,warm,dry. PSYCH: Mood and behavior normal.   Lab Results  Component Value Date   NITRICOXIDE 21 04/19/2023         Assessment & Plan:     ICD-10-CM   1. Moderate persistent asthma without complication  J45.40 Nitric oxide    CANCELED: POCT EXHALED NITRIC OXIDE   Trial of advair 100/50, 1 puff BID Continue as needed albuterol    2. Chronic cough  R05.3    Suspect due to poorly controlled asthma Other possibulities: Inderal/letrozole side effects     Orders Placed This Encounter  Procedures   Nitric oxide   Meds ordered this encounter  Medications   fluticasone-salmeterol (ADVAIR) 100-50 MCG/ACT AEPB    Sig: Inhale 1 puff into the lungs 2 (two) times daily.    Dispense:  60 each    Refill:  11    Will see the patient on F/U in 3 mos. time or  PRN.  Gailen Shelter, MD Advanced Bronchoscopy PCCM [facility]    *This note was dictated using voice recognition software/Dragon.  Despite best efforts to proofread, errors can occur which can change the meaning. Any transcriptional errors that result from this process are unintentional and may not be fully corrected at the time of dictation.

## 2023-04-19 NOTE — Progress Notes (Signed)
Patient seen in the office today and instructed on use of Advair.  Patient expressed understanding and demonstrated technique.

## 2023-04-19 NOTE — Patient Instructions (Signed)
Your level of inflammation in the airway was low.  Provided you a prescription for an inhaler called Advair (fluticasone/salmeterol) this is 1 puff twice a day.  Make sure you rinse your mouth well after you use it.  This will help with your asthma.  You may still use your albuterol as needed.  We will see you in follow-up in 3 months time.

## 2023-04-22 ENCOUNTER — Encounter: Payer: Self-pay | Admitting: Pulmonary Disease

## 2023-05-02 NOTE — Progress Notes (Unsigned)
Cardiology Office Note  Date:  05/03/2023   ID:  Anita Benjamin, DOB 18-Feb-1934, MRN [id number]  PCP:  Dale [place], MD   Chief Complaint  Patient presents with   12 month follow up     "Doing well." Medications reviewed by the patient verbally.     HPI:  Anita Benjamin is a very pleasant 89 y.o. woman, with a history of  tachycardia, SVT episodes,  atrial flutter, hypotension. several year history, possibly even up to 10 years of tachyarrhythmia. High BP in doctor offices Bradycardia 2019 on beta-blocker and amiodarone for flutter She presents today for follow-up of her tachycardia and chest pain  Last seen by myself in clinic June 2022 Seen by one of our providers July 2023 On that visit reported having shortness of breath on exertion Followed by pulmonary  In follow-up today reports she feels well Active, does her chores No regular walking program  Denies significant chest pain No recent issues with paroxysmal tachycardia Is not taking propranolol frequently  Lab work reviewed Total cholesterol 138 LDL 50, tolerating Crestor 5  History of breast surgery completed, lumpectomy  EKG personally reviewed by myself on todays visit EKG Interpretation Date/Time:  Tuesday May 03 2023 08:41:14 EDT Ventricular Rate:  101 PR Interval:  170 QRS Duration:  76 QT Interval:  216 QTC Calculation: 280 R Axis:   61  Text Interpretation: Normal sinus rhythm When compared with ECG of 29-Dec-2020 12:15, No significant change was found Confirmed by Julien Nordmann [phone]) on 05/03/2023 9:01:31 AM    Past medical history reviewed Had PNA 12/3031 Fever, RSV, hypoxia sats 70% probably got from grandchildren Went into atrial fib 12/29/20, rate 130 bpm Converted to NSR  Other past medical history reviewed  syncope 05/23/2019  occurring after eating a spicy sandwich leading to abdominal pain and diarrhea.  feeling sick Felt to be vasovagal syncope was bending over and does not remember  falling.  Came to on the ground.  Did not feel bad prior to this last episode.   echo and carotid ultrasound in 10.2020.    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  (possibly amiodarone).  Off amiodarone now.   Echocardiogram, 1. Left ventricular ejection fraction, by visual estimation, is 60 to 65%. The left ventricle has normal function. Normal left ventricular size. There is no left ventricular hypertrophy.  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  Normal carotid ultrasound.    November 2019 for atrial flutter, started on amiodarone, early later converted to normal sinus rhythm Metoprolol held for bradycardia Remains on amiodarone 200 mg daily   10/01/2016 she had atrial flutter rate 120 bpm  started on amiodarone, metoprolol, eliquis.   PMH:   has a past medical history of Anemia, Atrial fibrillation (HCC), Atrial flutter (HCC), Fibrocystic breast disease, GERD (gastroesophageal reflux disease), History of chicken pox, Hypercholesterolemia, and Hypertension.  PSH:    Past Surgical History:  Procedure Laterality Date   BREAST BIOPSY Right 2014   NEG   BREAST BIOPSY Right 05/24/2022   stereo bx asymmetry, x marker, benign   BREAST BIOPSY Right 05/25/2022   [place] Core BX, Coil Clip - benign   BREAST LUMPECTOMY     BREAST LUMPECTOMY WITH SENTINEL LYMPH NODE BIOPSY Left 06/04/2020   Procedure: BREAST LUMPECTOMY WITH SENTINEL LYMPH NODE BX;  Surgeon: Donnalee Curry  W, MD;  Location: [facility] ORS;  Service: General;  Laterality: Left;   CATARACT EXTRACTION     DILATION AND CURETTAGE OF UTERUS     Laser repair of torn retina      Current Outpatient Medications  Medication Sig Dispense Refill   acetaminophen (TYLENOL)  650 MG CR tablet Take 650 mg by mouth every 8 (eight) hours as needed for pain.     Albuterol Sulfate (PROAIR RESPICLICK) 108 (90 Base) MCG/ACT AEPB Inhale 2 puffs into the lungs every 6 (six) hours as needed.     amLODipine (NORVASC) 5 MG tablet TAKE 1 TABLET BY MOUTH ONCE DAILY AT DINNER TIME 30 tablet 1   Ascorbic Acid (VITAMIN C) 250 MG CHEW Chew 250 mg by mouth daily.      calcium citrate-vitamin D 500-500 MG-UNIT chewable tablet Chew 3 tablets by mouth daily.     Cranberry-Vitamin C-Probiotic (AZO CRANBERRY PO) Take 1 tablet by mouth daily.     ELIQUIS 2.5 MG TABS tablet TAKE 1 TABLET BY MOUTH TWICE DAILY 180 tablet 1   fluticasone-salmeterol (ADVAIR) 100-50 MCG/ACT AEPB Inhale 1 puff into the lungs 2 (two) times daily. 60 each 11   letrozole (FEMARA) 2.5 MG tablet Take 2.5 mg by mouth daily.     losartan (COZAAR) 100 MG tablet TAKE 1 TABLET BY MOUTH ONCE DAILY 90 tablet 1   Multiple Vitamin (MULTIVITAMIN) tablet Take 1 tablet by mouth daily.     mupirocin ointment (BACTROBAN) 2 % Apply 1 Application topically 2 (two) times daily. 22 g 0   Probiotic Product (PROBIOTIC ADVANCED PO) Take 1 capsule by mouth daily.     propranolol (INDERAL) 10 MG tablet TAKE 1 TABLET BY MOUTH 3 TIMES DAILY AS NEEDED. MAY TAKE EXTRA FOR BREAKTHROUGH HEART RATE 270 tablet 0   rosuvastatin (CRESTOR) 5 MG tablet Take 1 tablet (5 mg total) by mouth daily. 30 tablet 2   sertraline (ZOLOFT) 50 MG tablet Take 1 tablet (50 mg total) by mouth at bedtime. 90 tablet 1   No current facility-administered medications for this visit.    Allergies:   Oruvail [ketoprofen], Relafen [nabumetone], and Timentin [ticarcillin-pot clavulanate]   Social History:  The patient  reports that she has never smoked. She has never used smokeless tobacco. She reports that she does not drink alcohol and does not use drugs.   Family History:   family history includes Breast cancer in her paternal aunt; Cancer (age of onset: 74) in her father;  Heart disease (age of onset: 22) in her mother.    Review of Systems: Review of Systems  Constitutional: Negative.   HENT: Negative.    Eyes: Negative.   Respiratory: Negative.    Cardiovascular: Negative.  Negative for palpitations.       Paroxysmal tachycardia  Gastrointestinal: Negative.   Genitourinary: Negative.   Musculoskeletal: Negative.   Neurological: Negative.   Psychiatric/Behavioral: Negative.    All other systems reviewed and are negative.   PHYSICAL EXAM: VS:  BP 130/60 (BP Location: Right Arm, Patient Position: Sitting, Cuff Size: Normal)   Pulse (!) 101   Ht 5\' 7"  (1.702 m)   Wt 128 lb 4 oz (58.2 kg)   LMP 10/27/1989   SpO2 96%   BMI 20.09 kg/m  , BMI Body mass index is 20.09 kg/m. Constitutional:  oriented to person, place, and time. No distress.  HENT:  Head: Grossly normal Eyes:  no discharge. No scleral icterus.  Neck: No JVD, no carotid bruits  Cardiovascular: Regular rate and rhythm, no murmurs appreciated Pulmonary/Chest: Clear to auscultation bilaterally, no wheezes or rails Abdominal: Soft.  no distension.  no tenderness.  Musculoskeletal: Normal range of motion Neurological:  normal muscle tone. Coordination normal. No atrophy Skin: Skin warm and dry Psychiatric: normal affect, pleasant  Recent Labs: 03/30/2023: ALT 13; BUN 20; Creatinine, Ser 0.96; Hemoglobin 13.2; Platelets 242.0; Potassium 4.2; Sodium 140; TSH 2.81    Lipid Panel Lab Results  Component Value Date   CHOL 138 03/30/2023   HDL 67.40 03/30/2023   LDLCALC 50 03/30/2023   TRIG 106.0 03/30/2023      Wt Readings from Last 3 Encounters:  05/03/23 128 lb 4 oz (58.2 kg)  04/19/23 127 lb 12.8 oz (58 kg)  03/02/23 129 lb 3.2 oz (58.6 kg)     ASSESSMENT AND PLAN:  Syncope Prior episode vasovagal syncope No further episodes, blood pressure stable  Thyroid nodules Seen on carotid ultrasound,  thyroid ultrasound: Mild thyromegaly with bilateral cysts and complex cysts.    Essential hypertension Blood pressure is well controlled on today's visit. No changes made to the medications.  Typical atrial flutter (HCC) Continue anticoagulation Eliquis reduced dose 2.5 twice daily Symptoms better with propranolol 10 mg every morning with extra as needed Metoprolol succinate and diltiazem off her medication list  Chronic fatigue Poor sleep hygiene  Chest pain No recent chest pain symptoms  Encounter for anticoagulation discussion and counseling Continue Eliquis 2.5 twice daily, CBC stable   Total encounter time more than 30 minutes  Greater than 50% was spent in counseling and coordination of care with the patient   Orders Placed This Encounter  Procedures   EKG 12-Lead    Signed, Dossie Arbour, M.D., Ph.D. 05/03/2023  [facility] [place], [place] [phone]

## 2023-05-03 ENCOUNTER — Encounter: Payer: Self-pay | Admitting: Cardiovascular Disease

## 2023-05-03 ENCOUNTER — Ambulatory Visit: Payer: Medicare Other | Attending: Cardiovascular Disease | Admitting: Cardiovascular Disease

## 2023-05-03 VITALS — BP 130/60 | HR 70 | Ht 67.0 in | Wt 128.2 lb

## 2023-05-03 DIAGNOSIS — R0609 Other forms of dyspnea: Secondary | ICD-10-CM | POA: Diagnosis not present

## 2023-05-03 DIAGNOSIS — I483 Typical atrial flutter: Secondary | ICD-10-CM

## 2023-05-03 DIAGNOSIS — I4891 Unspecified atrial fibrillation: Secondary | ICD-10-CM | POA: Diagnosis not present

## 2023-05-03 DIAGNOSIS — I700 Atherosclerosis of aorta: Secondary | ICD-10-CM

## 2023-05-03 DIAGNOSIS — I10 Essential (primary) hypertension: Secondary | ICD-10-CM | POA: Diagnosis not present

## 2023-05-03 DIAGNOSIS — E7800 Pure hypercholesterolemia, unspecified: Secondary | ICD-10-CM

## 2023-05-03 MED ORDER — AMLODIPINE BESYLATE 5 MG PO TABS: ORAL_TABLET | ORAL | 3 refills | Status: DC

## 2023-05-03 MED ORDER — PROPRANOLOL HCL 10 MG PO TABS: 10.0000 mg | ORAL_TABLET | Freq: Three times a day (TID) | ORAL | 1 refills | Status: DC | PRN

## 2023-05-03 MED ORDER — APIXABAN 2.5 MG PO TABS: 2.5000 mg | ORAL_TABLET | Freq: Two times a day (BID) | ORAL | 3 refills | Status: DC

## 2023-05-03 NOTE — Patient Instructions (Signed)

## 2023-05-10 ENCOUNTER — Ambulatory Visit
Admission: RE | Admit: 2023-05-10 | Discharge: 2023-05-10 | Disposition: A | Discharge status: Home / Self Care | Payer: Medicare Other | Source: Ambulatory Visit | Attending: General Surgery | Admitting: General Surgery

## 2023-05-10 DIAGNOSIS — Z1231 Encounter for screening mammogram for malignant neoplasm of breast: Secondary | ICD-10-CM | POA: Insufficient documentation

## 2023-05-19 ENCOUNTER — Other Ambulatory Visit: Payer: Self-pay | Admitting: Internal Medicine

## 2023-06-29 ENCOUNTER — Other Ambulatory Visit: Payer: Self-pay | Admitting: Internal Medicine

## 2023-07-12 ENCOUNTER — Encounter: Payer: Self-pay | Admitting: Internal Medicine

## 2023-07-12 ENCOUNTER — Ambulatory Visit: Payer: Medicare Other | Admitting: Internal Medicine

## 2023-07-12 VITALS — BP 130/76 | HR 49 | Temp 97.8°F | Resp 17 | Ht 67.0 in | Wt 128.2 lb

## 2023-07-12 DIAGNOSIS — I4891 Unspecified atrial fibrillation: Secondary | ICD-10-CM

## 2023-07-12 DIAGNOSIS — I700 Atherosclerosis of aorta: Secondary | ICD-10-CM

## 2023-07-12 DIAGNOSIS — J4520 Mild intermittent asthma, uncomplicated: Secondary | ICD-10-CM

## 2023-07-12 DIAGNOSIS — I483 Typical atrial flutter: Secondary | ICD-10-CM

## 2023-07-12 DIAGNOSIS — D6859 Other primary thrombophilia: Secondary | ICD-10-CM | POA: Diagnosis not present

## 2023-07-12 DIAGNOSIS — E041 Nontoxic single thyroid nodule: Secondary | ICD-10-CM

## 2023-07-12 DIAGNOSIS — W19XXXA Unspecified fall, initial encounter: Secondary | ICD-10-CM

## 2023-07-12 DIAGNOSIS — E7800 Pure hypercholesterolemia, unspecified: Secondary | ICD-10-CM

## 2023-07-12 DIAGNOSIS — R739 Hyperglycemia, unspecified: Secondary | ICD-10-CM

## 2023-07-12 DIAGNOSIS — Z23 Encounter for immunization: Secondary | ICD-10-CM

## 2023-07-12 DIAGNOSIS — I10 Essential (primary) hypertension: Secondary | ICD-10-CM

## 2023-07-12 DIAGNOSIS — Z0000 Encounter for general adult medical examination without abnormal findings: Secondary | ICD-10-CM

## 2023-07-12 DIAGNOSIS — C50919 Malignant neoplasm of unspecified site of unspecified female breast: Secondary | ICD-10-CM

## 2023-07-12 DIAGNOSIS — F439 Reaction to severe stress, unspecified: Secondary | ICD-10-CM

## 2023-07-12 DIAGNOSIS — D649 Anemia, unspecified: Secondary | ICD-10-CM

## 2023-07-12 LAB — CBC WITH DIFFERENTIAL/PLATELET
Basophils Absolute: 0 10*3/uL (ref 0.0–0.1)
Basophils Relative: 0.7 % (ref 0.0–3.0)
Eosinophils Absolute: 0.2 10*3/uL (ref 0.0–0.7)
Eosinophils Relative: 4.3 % (ref 0.0–5.0)
HCT: 40.8 % (ref 36.0–46.0)
Hemoglobin: 13.4 g/dL (ref 12.0–15.0)
Lymphocytes Relative: 19.7 % (ref 12.0–46.0)
Lymphs Abs: 1 10*3/uL (ref 0.7–4.0)
MCHC: 32.9 g/dL (ref 30.0–36.0)
MCV: 97.9 fL (ref 78.0–100.0)
Monocytes Absolute: 0.6 10*3/uL (ref 0.1–1.0)
Monocytes Relative: 10.9 % (ref 3.0–12.0)
Neutro Abs: 3.4 10*3/uL (ref 1.4–7.7)
Neutrophils Relative %: 64.4 % (ref 43.0–77.0)
Platelets: 266 10*3/uL (ref 150.0–400.0)
RBC: 4.17 Mil/uL (ref 3.87–5.11)
RDW: 13.3 % (ref 11.5–15.5)
WBC: 5.2 10*3/uL (ref 4.0–10.5)

## 2023-07-12 LAB — HEPATIC FUNCTION PANEL
ALT: 15 U/L (ref 0–35)
AST: 23 U/L (ref 0–37)
Albumin: 4.5 g/dL (ref 3.5–5.2)
Alkaline Phosphatase: 59 U/L (ref 39–117)
Bilirubin, Direct: 0.1 mg/dL (ref 0.0–0.3)
Total Bilirubin: 0.6 mg/dL (ref 0.2–1.2)
Total Protein: 7.3 g/dL (ref 6.0–8.3)

## 2023-07-12 LAB — BASIC METABOLIC PANEL
BUN: 19 mg/dL (ref 6–23)
CO2: 30 meq/L (ref 19–32)
Calcium: 10 mg/dL (ref 8.4–10.5)
Chloride: 101 meq/L (ref 96–112)
Creatinine, Ser: 0.97 mg/dL (ref 0.40–1.20)
GFR: 52.6 mL/min — ABNORMAL LOW (ref >60.00)
Glucose, Bld: 94 mg/dL (ref 70–99)
Potassium: 4.7 meq/L (ref 3.5–5.1)
Sodium: 139 meq/L (ref 135–145)

## 2023-07-12 LAB — HEMOGLOBIN A1C: Hgb A1c MFr Bld: 5.7 % (ref 4.6–6.5)

## 2023-07-12 LAB — LIPID PANEL
Cholesterol: 165 mg/dL (ref 0–200)
HDL: 76.6 mg/dL (ref >39.00)
LDL Cholesterol: 59 mg/dL (ref 0–99)
NonHDL: 88.57
Total CHOL/HDL Ratio: 2
Triglycerides: 146 mg/dL (ref 0.0–149.0)
VLDL: 29.2 mg/dL (ref 0.0–40.0)

## 2023-07-12 LAB — IBC + FERRITIN
Ferritin: 101.7 ng/mL (ref 10.0–291.0)
Iron: 132 ug/dL (ref 42–145)
Saturation Ratios: 34.8 % (ref 20.0–50.0)
TIBC: 379.4 ug/dL (ref 250.0–450.0)
Transferrin: 271 mg/dL (ref 212.0–360.0)

## 2023-07-12 LAB — TSH: TSH: 4.88 u[IU]/mL (ref 0.35–5.50)

## 2023-07-12 MED ORDER — SERTRALINE HCL 50 MG PO TABS: 50.0000 mg | ORAL_TABLET | Freq: Every day | ORAL | 1 refills | Status: DC

## 2023-07-12 MED ORDER — ROSUVASTATIN CALCIUM 5 MG PO TABS: 5.0000 mg | ORAL_TABLET | Freq: Every evening | ORAL | 3 refills | Status: DC

## 2023-07-12 NOTE — Progress Notes (Signed)
Subjective:    Patient ID: Anita Benjamin, female    DOB: 05-Feb-1934, 89 y.o.   MRN: [id number]  Patient here for  Chief Complaint  Patient presents with   Annual Exam    CPE    HPI Here for a physical exam. Had f/u with Dr Maia Plan 05/17/23 - f/u breast cancer surveillance. F/u mammogram 05/10/23 - ok. Recommended f/u in one year. Continues on letrozole. Had f/u with Dr Mariah Milling 05/03/23 - f/u atrial flutter.  Continues on eliquis.  Has propranolol to take prn.  Per review, had previously noted thyroid nodules on carotid ultrasound.  F/u thyroid ultrasound revealed - mild thyromegaly with bilateral cysts and complex cysts.  Per report, none meet criteria for biopsy or follow up.  She reports she is doing relatively well. She did have a fall a few days ago.  She was holding on and sat down.  No head injury.  Does report some pain - right low back.  No radicular symptoms.     Past Medical History:  Diagnosis Date   Anemia    Atrial fibrillation (HCC)    Atrial flutter (HCC)    Fibrocystic breast disease    GERD (gastroesophageal reflux disease)    History of chicken pox    Hypercholesterolemia    Hypertension    Past Surgical History:  Procedure Laterality Date   BREAST BIOPSY Right 2014   NEG   BREAST BIOPSY Right 05/24/2022   stereo bx asymmetry, x marker, benign   BREAST BIOPSY Right 05/25/2022   [place] Core BX, Coil Clip - benign   BREAST LUMPECTOMY     BREAST LUMPECTOMY WITH SENTINEL LYMPH NODE BIOPSY Left 06/04/2020   Procedure: BREAST LUMPECTOMY WITH SENTINEL LYMPH NODE BX;  Surgeon: Earline Mayotte, MD;  Location: [facility] ORS;  Service: General;  Laterality: Left;   CATARACT EXTRACTION     DILATION AND CURETTAGE OF UTERUS     Laser repair of torn retina     Family History  Problem Relation Age of Onset   Heart disease Mother 93       Heart attack   Cancer Father 82       lung cancer   Breast cancer Paternal Aunt        x3   Colon cancer Neg Hx    Social History    Socioeconomic History   Marital status: Widowed    Spouse name: Not on file   Number of children: 2   Years of education: Not on file   Highest education level: Not on file  Occupational History   Not on file  Tobacco Use   Smoking status: Never   Smokeless tobacco: Never  Vaping Use   Vaping status: Never Used  Substance and Sexual Activity   Alcohol use: No    Alcohol/week: 0.0 standard drinks of alcohol   Drug use: No   Sexual activity: Not on file  Other Topics Concern   Not on file  Social History Narrative   Not on file   Social Determinants of Health   Financial Resource Strain: Low Risk  (02/13/2018)   Overall Financial Resource Strain (CARDIA)    Difficulty of Paying Living Expenses: Not hard at all  Food Insecurity: No Food Insecurity (02/13/2018)   Hunger Vital Sign    Worried About Running Out of Food in the Last Year: Never true    Ran Out of Food in the Last Year: Never true  Transportation Needs: No Transportation Needs (  02/13/2018)   PRAPARE - Administrator, Civil Service (Medical): No    Lack of Transportation (Non-Medical): No  Physical Activity: Unknown (02/13/2018)   Exercise Vital Sign    Days of Exercise per Week: 0 days    Minutes of Exercise per Session: Not on file  Stress: No Stress Concern Present (02/13/2018)   [employer] of Occupational Health - Occupational Stress Questionnaire    Feeling of Stress : Not at all  Social Connections: Not on file     Review of Systems  Constitutional:  Negative for appetite change and unexpected weight change.  HENT:  Negative for congestion, sinus pressure and sore throat.   Eyes:  Negative for pain and visual disturbance.  Respiratory:  Negative for cough, chest tightness and shortness of breath.   Cardiovascular:  Negative for chest pain and palpitations.  Gastrointestinal:  Negative for abdominal pain, diarrhea, nausea and vomiting.  Genitourinary:  Negative for difficulty  urinating and dysuria.  Musculoskeletal:  Positive for back pain. Negative for joint swelling and myalgias.  Skin:  Negative for color change and rash.  Neurological:  Negative for dizziness and headaches.  Hematological:  Negative for adenopathy. Does not bruise/bleed easily.  Psychiatric/Behavioral:  Negative for agitation and dysphoric mood.        Objective:     BP 130/76   Pulse (!) 49   Temp 97.8 F (36.6 C) (Oral)   Resp 17   Wt 128 lb 4 oz (58.2 kg)   LMP 10/27/1989   SpO2 97%   BMI 20.09 kg/m  Wt Readings from Last 3 Encounters:  07/12/23 128 lb 4 oz (58.2 kg)  05/03/23 128 lb 4 oz (58.2 kg)  04/19/23 127 lb 12.8 oz (58 kg)    Physical Exam Vitals reviewed.  Constitutional:      General: She is not in acute distress.    Appearance: Normal appearance. She is well-developed.  HENT:     Head: Normocephalic and atraumatic.     Right Ear: External ear normal.     Left Ear: External ear normal.  Eyes:     General: No scleral icterus.       Right eye: No discharge.        Left eye: No discharge.     Conjunctiva/sclera: Conjunctivae normal.  Neck:     Thyroid: No thyromegaly.  Cardiovascular:     Rate and Rhythm: Normal rate and regular rhythm.  Pulmonary:     Effort: No tachypnea, accessory muscle usage or respiratory distress.     Breath sounds: Normal breath sounds. No decreased breath sounds or wheezing.  Chest:  Breasts:    Right: No inverted nipple, mass, nipple discharge or tenderness (no axillary adenopathy).     Left: No inverted nipple, mass, nipple discharge or tenderness (no axilarry adenopathy).  Abdominal:     General: Bowel sounds are normal.     Palpations: Abdomen is soft.     Tenderness: There is no abdominal tenderness.  Musculoskeletal:        General: No swelling or tenderness.     Cervical back: Neck supple.     Comments: No pain - SLR.   Lymphadenopathy:     Cervical: No cervical adenopathy.  Skin:    Findings: No erythema or  rash.  Neurological:     Mental Status: She is alert and oriented to person, place, and time.  Psychiatric:        Mood and Affect: Mood normal.  Behavior: Behavior normal.      Outpatient Encounter Medications as of 07/12/2023  Medication Sig   acetaminophen (TYLENOL) 650 MG CR tablet Take 650 mg by mouth every 8 (eight) hours as needed for pain.   Albuterol Sulfate (PROAIR RESPICLICK) 108 (90 Base) MCG/ACT AEPB Inhale 2 puffs into the lungs every 6 (six) hours as needed.   amLODipine (NORVASC) 5 MG tablet TAKE 1 TABLET BY MOUTH ONCE DAILY AT DINNER TIME   apixaban (ELIQUIS) 2.5 MG TABS tablet Take 1 tablet (2.5 mg total) by mouth 2 (two) times daily.   Ascorbic Acid (VITAMIN C) 250 MG CHEW Chew 250 mg by mouth daily.    calcium citrate-vitamin D 500-500 MG-UNIT chewable tablet Chew 3 tablets by mouth daily.   Cranberry-Vitamin C-Probiotic (AZO CRANBERRY PO) Take 1 tablet by mouth daily.   fluticasone-salmeterol (ADVAIR) 100-50 MCG/ACT AEPB Inhale 1 puff into the lungs 2 (two) times daily.   letrozole (FEMARA) 2.5 MG tablet Take 2.5 mg by mouth daily.   losartan (COZAAR) 100 MG tablet TAKE 1 TABLET BY MOUTH ONCE DAILY   Multiple Vitamin (MULTIVITAMIN) tablet Take 1 tablet by mouth daily.   mupirocin ointment (BACTROBAN) 2 % Apply 1 Application topically 2 (two) times daily.   Probiotic Product (PROBIOTIC ADVANCED PO) Take 1 capsule by mouth daily.   propranolol (INDERAL) 10 MG tablet Take 1 tablet (10 mg total) by mouth 3 (three) times daily as needed.   [DISCONTINUED] rosuvastatin (CRESTOR) 5 MG tablet TAKE 1 TABLET BY MOUTH ONCE EVERY EVENING   [DISCONTINUED] sertraline (ZOLOFT) 50 MG tablet Take 1 tablet (50 mg total) by mouth at bedtime.   rosuvastatin (CRESTOR) 5 MG tablet Take 1 tablet (5 mg total) by mouth every evening. TAKE 1 TABLET BY MOUTH ONCE EVERY EVENING   sertraline (ZOLOFT) 50 MG tablet Take 1 tablet (50 mg total) by mouth daily. Take 1 tablet q am   No  facility-administered encounter medications on file as of 07/12/2023.     Lab Results  Component Value Date   WBC 5.2 07/12/2023   HGB 13.4 07/12/2023   HCT 40.8 07/12/2023   PLT 266.0 07/12/2023   GLUCOSE 94 07/12/2023   CHOL 165 07/12/2023   TRIG 146.0 07/12/2023   HDL 76.60 07/12/2023   LDLDIRECT 118.0 10/06/2022   LDLCALC 59 07/12/2023   ALT 15 07/12/2023   AST 23 07/12/2023   NA 139 07/12/2023   K 4.7 07/12/2023   CL 101 07/12/2023   CREATININE 0.97 07/12/2023   BUN 19 07/12/2023   CO2 30 07/12/2023   TSH 4.88 07/12/2023   HGBA1C 5.7 07/12/2023    MM 3D SCREENING MAMMOGRAM BILATERAL BREAST  Result Date: 05/11/2023 CLINICAL DATA:  Screening. EXAM: DIGITAL SCREENING BILATERAL MAMMOGRAM WITH TOMOSYNTHESIS AND CAD TECHNIQUE: Bilateral screening digital craniocaudal and mediolateral oblique mammograms were obtained. Bilateral screening digital breast tomosynthesis was performed. The images were evaluated with computer-aided detection. COMPARISON:  Previous exam(s). ACR Breast Density Category b: There are scattered areas of fibroglandular density. FINDINGS: There are no findings suspicious for malignancy. IMPRESSION: No mammographic evidence of malignancy. A result letter of this screening mammogram will be mailed directly to the patient. RECOMMENDATION: Screening mammogram in one year. (Code:SM-B-01Y) BI-RADS CATEGORY  1: Negative. Electronically Signed   By: Sherian Rein M.D.   On: 05/11/2023 11:35       Assessment & Plan:  Health care maintenance Assessment & Plan: Physical today 07/12/23.  Saw GI.  cologuard negative.  Mammogram 05/11/23 - Birads  I.    Hypercholesterolemia Assessment & Plan: On crestor.  Follow lipid panel and liver function tests.   Orders: -     Basic metabolic panel -     Hepatic function panel -     TSH -     Lipid panel  Hyperglycemia Assessment & Plan: Follow met b and a1c.   Orders: -     Hemoglobin A1c  Thrombophilia (HCC) Assessment &  Plan: afib on eliquis.    Hemochromatosis, hereditary (HCC) Assessment & Plan: Continue to follow iron studies annually.  Saw hematology.    Orders: -     CBC with Differential/Platelet -     IBC + Ferritin  Need for influenza vaccination -     Flu Vaccine Trivalent High Dose (Fluad)  Stress Assessment & Plan: Continue zoloft.  Stable.    Essential hypertension Assessment & Plan: Continue amlodipine and losartan.  Blood pressure as outlined.  Follow pressures.  Follow metabolic panel.    Malignant neoplasm of female breast, unspecified estrogen receptor status, unspecified laterality, unspecified site of breast [facility]) Assessment & Plan: Had f/u with Dr Maia Plan 05/17/23 - f/u breast cancer surveillance. F/u mammogram 05/10/23 - ok. Recommended f/u in one year.   Typical atrial flutter (HCC) Assessment & Plan: Continue Eliquis.  Has been instructed to take propranolol..  Stable.   Atrial fibrillation, unspecified type [facility]) Assessment & Plan: Appears to be in SR on exam.  Continues on eliquis.     Mild intermittent asthma without complication Assessment & Plan: Saw Dr Jayme Cloud - 04/2022 - Recommend trial of Trelegy Ellipta 1 puff daily. Also encouraged to use albuterol as needed.  She was doing well with trelegy.  Breathing - better. Cough - better.    Aortic atherosclerosis (HCC) Assessment & Plan: Continue crestor.     Anemia, unspecified type Assessment & Plan: Follow cbc.    Fall, initial encounter Assessment & Plan: S/p recent fall.  No head injury.  Right low back pain.  Discussed further evaluation.  Feeling better. Notify me if desires further evaluation/PT, etc.    Thyroid cyst Assessment & Plan:  Per review, had previously noted thyroid nodules on carotid ultrasound.  F/u thyroid ultrasound revealed - mild thyromegaly with bilateral cysts and complex cysts.  Per report, none meet criteria for biopsy or follow up. D/w her regarding f/u thyroid  ultrasound.    Other orders -     Rosuvastatin Calcium; Take 1 tablet (5 mg total) by mouth every evening. TAKE 1 TABLET BY MOUTH ONCE EVERY EVENING  Dispense: 90 tablet; Refill: 3 -     Sertraline HCl; Take 1 tablet (50 mg total) by mouth daily. Take 1 tablet q am  Dispense: 90 tablet; Refill: 1     Dale Orlovista, MD

## 2023-07-12 NOTE — Assessment & Plan Note (Signed)
Physical today 07/12/23.  Saw GI.  cologuard negative.  Mammogram 05/11/23 - Birads I.

## 2023-07-17 ENCOUNTER — Encounter: Payer: Self-pay | Admitting: Internal Medicine

## 2023-07-17 DIAGNOSIS — W19XXXA Unspecified fall, initial encounter: Secondary | ICD-10-CM | POA: Insufficient documentation

## 2023-07-17 DIAGNOSIS — E041 Nontoxic single thyroid nodule: Secondary | ICD-10-CM | POA: Insufficient documentation

## 2023-07-17 NOTE — Assessment & Plan Note (Signed)
Had f/u with Dr Maia Plan 05/17/23 - f/u breast cancer surveillance. F/u mammogram 05/10/23 - ok. Recommended f/u in one year.

## 2023-07-17 NOTE — Assessment & Plan Note (Signed)
afib on eliquis.

## 2023-07-17 NOTE — Assessment & Plan Note (Signed)
Follow met b and a1c.  

## 2023-07-17 NOTE — Assessment & Plan Note (Signed)
Continue zoloft.  Stable.

## 2023-07-17 NOTE — Assessment & Plan Note (Signed)
Per review, had previously noted thyroid nodules on carotid ultrasound.  F/u thyroid ultrasound revealed - mild thyromegaly with bilateral cysts and complex cysts.  Per report, none meet criteria for biopsy or follow up. D/w her regarding f/u thyroid ultrasound.

## 2023-07-17 NOTE — Assessment & Plan Note (Signed)
On crestor.  Follow lipid panel and liver function tests.

## 2023-07-17 NOTE — Assessment & Plan Note (Signed)
Appears to be in SR on exam.  Continues on eliquis.

## 2023-07-17 NOTE — Assessment & Plan Note (Signed)
S/p recent fall.  No head injury.  Right low back pain.  Discussed further evaluation.  Feeling better. Notify me if desires further evaluation/PT, etc.

## 2023-07-17 NOTE — Assessment & Plan Note (Signed)
Continue Eliquis.  Has been instructed to take propranolol..  Stable.

## 2023-07-17 NOTE — Assessment & Plan Note (Signed)
Saw Dr Jayme Cloud - 04/2022 - Recommend trial of Trelegy Ellipta 1 puff daily. Also encouraged to use albuterol as needed.  She was doing well with trelegy.  Breathing - better. Cough - better.

## 2023-07-17 NOTE — Assessment & Plan Note (Signed)
Continue crestor 

## 2023-07-17 NOTE — Assessment & Plan Note (Signed)
Follow cbc.  

## 2023-07-17 NOTE — Assessment & Plan Note (Signed)
Continue to follow iron studies annually.  Saw hematology.   ?

## 2023-07-17 NOTE — Assessment & Plan Note (Signed)
Continue amlodipine and losartan.  Blood pressure as outlined.  Follow pressures.  Follow metabolic panel.

## 2023-07-20 ENCOUNTER — Ambulatory Visit (INDEPENDENT_AMBULATORY_CARE_PROVIDER_SITE_OTHER): Payer: Medicare Other | Admitting: Pulmonary Disease

## 2023-07-20 ENCOUNTER — Encounter: Payer: Self-pay | Admitting: Pulmonary Disease

## 2023-07-20 VITALS — BP 128/78 | HR 54 | Temp 97.7°F | Ht 67.0 in | Wt 128.6 lb

## 2023-07-20 DIAGNOSIS — R053 Chronic cough: Secondary | ICD-10-CM | POA: Diagnosis not present

## 2023-07-20 DIAGNOSIS — J4540 Moderate persistent asthma, uncomplicated: Secondary | ICD-10-CM

## 2023-07-20 LAB — NITRIC OXIDE: Nitric Oxide: 17

## 2023-07-20 NOTE — Patient Instructions (Signed)
VISIT SUMMARY:  During your visit, we discussed your cough variant asthma and insomnia. You've reported significant improvement in your cough due to the use of an inhaler. We also discussed your sleep disturbances and the recent change in the timing of your sertraline medication.  YOUR PLAN:  -COUGH VARIANT ASTHMA: This is a type of asthma where the main symptom is a dry, non-productive cough. We've seen improvement with the use of your inhaler. To maintain this progress, you should increase the use of your inhaler to twice daily. Continue using your emergency inhaler for sudden coughing fits. Remember to rinse your mouth well after each use to prevent hoarseness.  -INSOMNIA: This is a sleep disorder that can make it hard to fall asleep. You've been taking sertraline in the morning instead of the evening, and we're hoping this change will improve your sleep. Please continue this regimen and observe if your sleep improves.  INSTRUCTIONS:  You've received your flu shot and are currently on Advair 100/50 1 puff twice a day. Please follow up in six months, or sooner if any issues arise.

## 2023-07-20 NOTE — Progress Notes (Signed)
Subjective:    Patient ID: Anita Benjamin, female    DOB: [birth year], 89 y.o.   MRN: [id number]  Patient Care Team: Dale Pittsburg, MD as PCP - General (Internal Medicine) Antonieta Iba, MD as PCP - Cardiology (Cardiology) Lemar Livings Merrily Pew, MD as Consulting Physician (General Surgery) Antonieta Iba, MD as Consulting Physician (Cardiology) Carmina Miller, MD as Referring Physician (Radiation Oncology)  Chief Complaint  Patient presents with   Follow-up    Doing good. No SOB, wheezing or cough.    BACKGROUND/INTERVAL:Patient is an 89 year old lifelong never smoker (albeit with significant secondhand smoke exposure) and a history as noted below, who presents for follow-up on the issue of cough and shortness of breath.  Has been diagnosed with cough variant asthma.  She was last seen here on 19 April 2023.  At that time patient was started on Advair 100/51 inhalation twice a day and albuterol as needed.  HPI  Discussed the use of AI scribe software for clinical note transcription with the patient, who gave verbal consent to proceed.  History of Present Illness   The patient, diagnosed with cough variant asthma, reports significant improvement in her cough, which had been a longstanding issue. She attributes this improvement to the use of an inhaler, which she has been using once daily. However, she was advised to use it twice daily to manage the inflammation in her airways, which she was unaware of. She also has an emergency inhaler for sudden coughing fits.  In addition to asthma, the patient has been experiencing sleep disturbances. She reports difficulty falling asleep despite feeling tired. She was previously taking sertraline at bedtime, but this was recently changed to a morning regimen. The patient notes some improvement in her sleep since this change, but it is unclear if the medication has fully taken effect.  The patient has received her flu shot and is currently on Advair  100/50.      DATA 11/20/2021 PFTs: FEV1 1.6 L or 64% predicted, FVC 2.26 L or 85% of predicted, FEV1/FVC 56%, there is significant bronchodilator response with a net change of 14% and FEV1.  Lung volumes were reduced however the test was not reproducible for lung volumes or diffusion capacity.  On the effort independent portion of the curve small airways component is noted.  Diffusion capacity mildly reduced not corrected for hemoglobin.  Overall patient has changes consistent with asthma.  Review of Systems A 10 point review of systems was performed and it is as noted above otherwise negative.   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/05/2021   Aortic atherosclerosis (HCC) 04/05/2021   Headache 04/03/2021   Atrial fibrillation (HCC) 01/15/2021   Anemia 01/12/2021   Atrial fibrillation with RVR (HCC) 01/01/2021   Hyponatremia 01/01/2021   CAP (community acquired pneumonia) 12/25/2020   Breast cancer (HCC) 06/09/2020   Diarrhea 04/11/2019   Hair loss 06/10/2017   Fatigue 02/19/2017   Chest pain 02/12/2017   Encounter  for anticoagulation discussion and counseling 10/05/2016   Cough 10/01/2016   Gas 10/27/2015   Dysuria 09/17/2015   Left hip pain 07/21/2015   Neck pain 06/09/2015   Syncope 03/25/2015   Left wrist pain 03/25/2015   SOB (shortness of breath) 12/01/2014   Abdominal discomfort 12/01/2014   Atrial flutter (HCC) 11/15/2014    Stress 11/08/2014   Difficulty sleeping 11/08/2014   Health care maintenance 11/08/2014   Adjustment disorder 10/14/2014   Tachycardia 07/17/2014   Hyperkalemia 07/17/2014   History of colonic polyps 03/09/2014   Decreased sense of taste 03/09/2014   Abnormal mammogram 07/31/2013   Abnormal mammogram 01/20/2013   Essential hypertension 10/29/2012   Hypercholesterolemia 10/29/2012   Leukopenia 10/29/2012    Social History   Tobacco Use   Smoking status: Never   Smokeless tobacco: Never  Substance Use Topics   Alcohol use: No    Alcohol/week: 0.0 standard drinks of alcohol    Allergies  Allergen Reactions   Oruvail [Ketoprofen]    Relafen [Nabumetone]    Timentin [Ticarcillin-Pot Clavulanate]     Current Meds  Medication Sig   acetaminophen (TYLENOL) 650 MG CR tablet Take 650 mg by mouth every 8 (eight) hours as needed for pain.   Albuterol Sulfate (PROAIR RESPICLICK) 108 (90 Base) MCG/ACT AEPB Inhale 2 puffs into the lungs every 6 (six) hours as needed.   amLODipine (NORVASC) 5 MG tablet TAKE 1 TABLET BY MOUTH ONCE DAILY AT DINNER TIME   apixaban (ELIQUIS) 2.5 MG TABS tablet Take 1 tablet (2.5 mg total) by mouth 2 (two) times daily.   Ascorbic Acid (VITAMIN C) 250 MG CHEW Chew 250 mg by mouth daily.    calcium citrate-vitamin D 500-500 MG-UNIT chewable tablet Chew 3 tablets by mouth daily.   Cranberry-Vitamin C-Probiotic (AZO CRANBERRY PO) Take 1 tablet by mouth daily.   fluticasone-salmeterol (ADVAIR) 100-50 MCG/ACT AEPB Inhale 1 puff into the lungs 2 (two) times daily.   letrozole (FEMARA) 2.5 MG tablet Take 2.5 mg by mouth daily.   losartan (COZAAR) 100 MG tablet TAKE 1 TABLET BY MOUTH ONCE DAILY   Multiple Vitamin (MULTIVITAMIN) tablet Take 1 tablet by mouth daily.   mupirocin ointment (BACTROBAN) 2 % Apply 1 Application topically 2 (two) times daily.   Probiotic Product (PROBIOTIC ADVANCED PO) Take 1 capsule by mouth daily.   propranolol (INDERAL) 10 MG tablet Take  1 tablet (10 mg total) by mouth 3 (three) times daily as needed.   rosuvastatin (CRESTOR) 5 MG tablet Take 1 tablet (5 mg total) by mouth every evening. TAKE 1 TABLET BY MOUTH ONCE EVERY EVENING   sertraline (ZOLOFT) 50 MG tablet Take 1 tablet (50 mg total) by mouth daily. Take 1 tablet q am    Immunization History  Administered Date(s) Administered   Fluad Quad(high Dose 65+) 06/22/2019, 07/30/2020, 06/12/2021, 07/13/2022   Fluad Trivalent(High Dose 65+) 07/12/2023   Influenza, High Dose Seasonal PF 06/10/2017, 09/14/2018   Influenza,inj,Quad PF,6+ Mos 06/27/2013, 07/04/2014, 06/05/2015, 05/27/2016   PFIZER(Purple Top)SARS-COV-2 Vaccination 10/31/2019, 11/21/2019   Pneumococcal Conjugate-13 10/21/2015   Pneumococcal Polysaccharide-23 10/06/2017   Tdap 10/27/2012        Objective:     BP 128/78 (BP Location: Right Arm, Cuff Size: Normal)   Pulse (!) 54   Temp 97.7 F (36.5 C)   Ht 5\' 7"  (1.702 m)   Wt 128 lb 9.6 oz (58.3 kg)   LMP 10/27/1989   SpO2 97%   BMI 20.14 kg/m   SpO2: 97 %  O2 Device: None (Room air)  GENERAL: Thin woman, no acute distress, does sound nasally congested, fully ambulatory.  No conversational dyspnea. HEAD: Normocephalic, atraumatic.  EYES: Pupils equal, round, reactive to light.  No scleral icterus.  MOUTH: Dentition intact, multiple fillings.  Oral mucosa moist.  No thrush. NECK: Supple. No thyromegaly. Trachea midline. No JVD.  No adenopathy. PULMONARY: Good air entry bilaterally.  No adventitious sounds. CARDIOVASCULAR: S1 and S2. Regular rate and rhythm.  No rubs, murmurs or gallops heard. ABDOMEN: Benign. MUSCULOSKELETAL: Osteoarthritis changes both hands, no clubbing, no edema.  NEUROLOGIC: No focal deficit, no gait disturbance, speech is fluent. SKIN: Intact,warm,dry. PSYCH: Mood and behavior normal.  Lab Results  Component Value Date   NITRICOXIDE 17 07/20/2023   Assessment & Plan:     ICD-10-CM   1. Moderate persistent asthma  without complication  J45.40     2. Chronic cough - RESOLVED!  R05.3       Orders Placed This Encounter  Procedures   Nitric oxide   Assessment and Plan    Cough Variant Asthma Cough resolved with use of inhaler. Patient currently using inhaler once daily. Nitric oxide level checked and found to be good, indicating low inflammation in airways. -Increase use of inhaler to twice daily (morning and evening) to maintain low inflammation levels. -Continue use of emergency inhaler as needed for coughing fits. -Rinse mouth well after each use of inhaler to prevent hoarseness/thrush.  Insomnia Difficulty falling asleep despite feeling tired. Recently switched time of taking Sertraline (Zoloft) from evening to morning. -Continue taking Sertraline in the morning as per Dr. Roby Lofts instructions. -Observe if change in medication timing improves sleep.  General Health Maintenance -Flu vaccination confirmed. -Follow-up in six months or sooner if any issues arise.       Gailen Shelter, MD Advanced Bronchoscopy PCCM [facility]    *This note was dictated using voice recognition software/Dragon.  Despite best efforts to proofread, errors can occur which can change the meaning. Any transcriptional errors that result from this process are unintentional and may not be fully corrected at the time of dictation.

## 2023-08-17 ENCOUNTER — Other Ambulatory Visit: Payer: Self-pay | Admitting: Internal Medicine

## 2023-08-17 DIAGNOSIS — E041 Nontoxic single thyroid nodule: Secondary | ICD-10-CM

## 2023-08-17 NOTE — Progress Notes (Signed)
Discussed with Anita Benjamin regarding follow up thyroid ultrasound.  Agreeable.  Order placed for f/u thyroid ultrasound.

## 2023-08-19 ENCOUNTER — Other Ambulatory Visit: Payer: Self-pay | Admitting: Internal Medicine

## 2023-08-23 ENCOUNTER — Ambulatory Visit
Admission: RE | Admit: 2023-08-23 | Discharge: 2023-08-23 | Disposition: A | Discharge status: Home / Self Care | Payer: Medicare Other | Source: Ambulatory Visit | Attending: Internal Medicine | Admitting: Internal Medicine

## 2023-08-23 DIAGNOSIS — E041 Nontoxic single thyroid nodule: Secondary | ICD-10-CM | POA: Diagnosis present

## 2023-08-30 ENCOUNTER — Telehealth: Payer: Self-pay

## 2023-08-30 NOTE — Telephone Encounter (Signed)
-----   Message from [place] sent at 08/30/2023  8:12 AM EST ----- Please call and notify - thyroid ultrasound - reveals multinodular thyroid (small cysts and nodules).  No acute abnormality.

## 2023-08-30 NOTE — Telephone Encounter (Signed)
Lvm for pt to give office a call back.

## 2023-08-31 ENCOUNTER — Telehealth: Payer: Self-pay

## 2023-08-31 NOTE — Telephone Encounter (Signed)
-----   Message from [place] sent at 08/30/2023  8:12 AM EST ----- Please call and notify - thyroid ultrasound - reveals multinodular thyroid (small cysts and nodules).  No acute abnormality.

## 2023-09-06 ENCOUNTER — Encounter: Payer: Self-pay | Admitting: Family

## 2023-09-06 ENCOUNTER — Ambulatory Visit
Admission: RE | Admit: 2023-09-06 | Discharge: 2023-09-06 | Disposition: A | Discharge status: Home / Self Care | Payer: Medicare Other | Source: Ambulatory Visit | Attending: Family | Admitting: Family

## 2023-09-06 ENCOUNTER — Ambulatory Visit
Admission: RE | Admit: 2023-09-06 | Discharge: 2023-09-06 | Disposition: A | Discharge status: Home / Self Care | Payer: Medicare Other | Attending: Family | Admitting: Family

## 2023-09-06 ENCOUNTER — Ambulatory Visit: Payer: Medicare Other | Admitting: Family

## 2023-09-06 VITALS — BP 120/60 | HR 53 | Temp 98.4°F | Resp 17 | Ht 67.0 in | Wt 130.2 lb

## 2023-09-06 DIAGNOSIS — M25561 Pain in right knee: Secondary | ICD-10-CM

## 2023-09-06 DIAGNOSIS — M25562 Pain in left knee: Secondary | ICD-10-CM

## 2023-09-06 NOTE — Patient Instructions (Signed)
I suspect your leg pain is related to knee arthritis and/or Baker's cyst.  Please schedule your x-ray at the front of our office. I recommend icing 20 minutes 3 times a day.  You may continue Tylenol arthritis as you are taking.  If that is not sufficient, I would consider tramadol for pain.  Please consider physical therapy.  Please wear Ace wrap during the day and at nighttime you do not have to wear the Ace wrap.   Nice to meet you!

## 2023-09-06 NOTE — Progress Notes (Signed)
Assessment & Plan:  Acute pain of left knee -     DG Knee Complete 4 Views Left; Future -     US ARTERIAL ABI (SCREENING LOWER EXTREMITY); Future  Right knee pain, unspecified chronicity Assessment & Plan: Presentation consistent with right knee arthritic changes and/or popliteal cyst.  Pending x-ray.  Discussed conservative management including Ace wrap, icing regimen, and scheduling Tylenol arthritis.  Consider tramadol, physical therapy      Return precautions given.   Risks, benefits, and alternatives of the medications and treatment plan prescribed today were discussed, and patient expressed understanding.   Education regarding symptom management and diagnosis given to patient on AVS either electronically or printed.  No follow-ups on file.  Rennie Plowman, FNP  Subjective:    Patient ID: Anita Benjamin, female    DOB: 22-Mar-1934, 89 y.o.   MRN: [id number]  CC: Anita Benjamin is a 89 y.o. female who presents today for an acute visit.    HPI: Complains of left posterior and anterior knee pain x 3 weeks She woke up one night with muscle cramp. She rubbed out the pain that night but ever since she has had pain with walking , going down the stairs.    No pain when sitting No falls   She is taking 2 tylenol arthritis in the morning and in the afternoon.    History of atrial fibrillation.  Compliant with Eliquis 2.5 mg twice daily History of breast cancer  Crt 0.97 Right leg duplex negative  12/16/2021 Allergies: Oruvail [ketoprofen], Relafen [nabumetone], and Timentin [ticarcillin-pot clavulanate] Current Outpatient Medications on File Prior to Visit  Medication Sig Dispense Refill   acetaminophen (TYLENOL) 650 MG CR tablet Take 650 mg by mouth every 8 (eight) hours as needed for pain.     Albuterol Sulfate (PROAIR RESPICLICK) 108 (90 Base) MCG/ACT AEPB Inhale 2 puffs into the lungs every 6 (six) hours as needed.     amLODipine (NORVASC) 5 MG tablet TAKE 1 TABLET BY MOUTH  ONCE DAILY AT DINNER TIME 90 tablet 3   apixaban (ELIQUIS) 2.5 MG TABS tablet Take 1 tablet (2.5 mg total) by mouth 2 (two) times daily. 180 tablet 3   Ascorbic Acid (VITAMIN C) 250 MG CHEW Chew 250 mg by mouth daily.      calcium citrate-vitamin D 500-500 MG-UNIT chewable tablet Chew 3 tablets by mouth daily.     Cranberry-Vitamin C-Probiotic (AZO CRANBERRY PO) Take 1 tablet by mouth daily.     fluticasone-salmeterol (ADVAIR) 100-50 MCG/ACT AEPB Inhale 1 puff into the lungs 2 (two) times daily. 60 each 11   letrozole (FEMARA) 2.5 MG tablet Take 2.5 mg by mouth daily.     losartan (COZAAR) 100 MG tablet TAKE 1 TABLET BY MOUTH ONCE DAILY 90 tablet 1   Multiple Vitamin (MULTIVITAMIN) tablet Take 1 tablet by mouth daily.     mupirocin ointment (BACTROBAN) 2 % Apply 1 Application topically 2 (two) times daily. 22 g 0   Probiotic Product (PROBIOTIC ADVANCED PO) Take 1 capsule by mouth daily.     propranolol (INDERAL) 10 MG tablet Take 1 tablet (10 mg total) by mouth 3 (three) times daily as needed. 270 tablet 1   rosuvastatin (CRESTOR) 5 MG tablet Take 1 tablet (5 mg total) by mouth every evening. TAKE 1 TABLET BY MOUTH ONCE EVERY EVENING 90 tablet 3   sertraline (ZOLOFT) 50 MG tablet Take 1 tablet (50 mg total) by mouth daily. Take 1 tablet q am 90 tablet 1  No current facility-administered medications on file prior to visit.    Review of Systems  Constitutional:  Negative for chills and fever.  Respiratory:  Negative for cough and shortness of breath.   Cardiovascular:  Negative for chest pain, palpitations and leg swelling.  Gastrointestinal:  Negative for nausea and vomiting.  Musculoskeletal:  Positive for arthralgias.      Objective:    BP 120/60   Pulse (!) 53   Temp 98.4 F (36.9 C) (Oral)   Resp 17   Ht 5\' 7"  (1.702 m)   Wt 130 lb 4 oz (59.1 kg)   LMP 10/27/1989   SpO2 98%   BMI 20.40 kg/m   BP Readings from Last 3 Encounters:  09/07/23 137/72  09/06/23 120/60   07/20/23 128/78   Wt Readings from Last 3 Encounters:  09/07/23 129 lb (58.5 kg)  09/06/23 130 lb 4 oz (59.1 kg)  07/20/23 128 lb 9.6 oz (58.3 kg)    Physical Exam Vitals reviewed.  Constitutional:      Appearance: She is well-developed.  Eyes:     Conjunctiva/sclera: Conjunctivae normal.  Cardiovascular:     Rate and Rhythm: Normal rate and regular rhythm.     Pulses: Normal pulses.     Heart sounds: Normal heart sounds.  Pulmonary:     Effort: Pulmonary effort is normal.     Breath sounds: Normal breath sounds. No wheezing, rhonchi or rales.  Musculoskeletal:     Left knee: No swelling or bony tenderness. Normal range of motion.     Comments: Cyst left posterior knee.   Skin:    General: Skin is warm and dry.  Neurological:     Mental Status: She is alert.  Psychiatric:        Speech: Speech normal.        Behavior: Behavior normal.        Thought Content: Thought content normal.

## 2023-09-07 ENCOUNTER — Encounter: Payer: Self-pay | Admitting: Radiation Oncology

## 2023-09-07 ENCOUNTER — Ambulatory Visit
Admission: RE | Admit: 2023-09-07 | Discharge: 2023-09-07 | Disposition: A | Discharge status: Home / Self Care | Payer: Medicare Other | Source: Ambulatory Visit | Attending: Radiation Oncology | Admitting: Radiation Oncology

## 2023-09-07 VITALS — BP 137/72 | HR 50 | Temp 97.6°F | Resp 16 | Wt 129.0 lb

## 2023-09-07 DIAGNOSIS — R928 Other abnormal and inconclusive findings on diagnostic imaging of breast: Secondary | ICD-10-CM

## 2023-09-07 DIAGNOSIS — Z923 Personal history of irradiation: Secondary | ICD-10-CM | POA: Diagnosis not present

## 2023-09-07 DIAGNOSIS — Z79811 Long term (current) use of aromatase inhibitors: Secondary | ICD-10-CM | POA: Insufficient documentation

## 2023-09-07 DIAGNOSIS — Z170 Estrogen receptor positive status [ER+]: Secondary | ICD-10-CM | POA: Diagnosis not present

## 2023-09-07 DIAGNOSIS — C50412 Malignant neoplasm of upper-outer quadrant of left female breast: Secondary | ICD-10-CM | POA: Diagnosis present

## 2023-09-07 NOTE — Progress Notes (Signed)
Radiation Oncology Follow up Note  Name: Anita Benjamin   Date:   09/07/2023 MRN:  [id number] DOB: 01-13-1934    This 89 y.o. female presents to the clinic today for 3-1/2-year follow-up status post whole breast radiation to her left breast for stage Ia ER positive invasive mammary carcinoma.  REFERRING PROVIDER: Dale Inger, MD  HPI: e patient, an 89 year old with a history of stage 1 AER positive invasive mammary carcinoma in the left breast, is currently on Femara. She underwent whole breast radiation over three years ago. Her most recent mammogram in August was BI-RADS 1 negative. She reports no problems with Femara. She is also under the care of Dr. Lorin Picket and a new provider following Dr. Mellody Life retirement..  She specifically denies breast tenderness cough or bone pain.  COMPLICATIONS OF TREATMENT: none  FOLLOW UP COMPLIANCE: keeps appointments   PHYSICAL EXAM:  BP 137/72   Pulse (!) 50   Temp 97.6 F (36.4 C) (Tympanic)   Resp 16   Wt 129 lb (58.5 kg)   LMP 10/27/1989   BMI 20.20 kg/m  Lungs are clear to A&P cardiac examination essentially unremarkable with regular rate and rhythm. No dominant mass or nodularity is noted in either breast in 2 positions examined. Incision is well-healed. No axillary or supraclavicular adenopathy is appreciated. Cosmetic result is excellent.  Well-developed well-nourished patient in NAD. HEENT reveals PERLA, EOMI, discs not visualized.  Oral cavity is clear. No oral mucosal lesions are identified. Neck is clear without evidence of cervical or supraclavicular adenopathy. Lungs are clear to A&P. Cardiac examination is essentially unremarkable with regular rate and rhythm without murmur rub or thrill. Abdomen is benign with no organomegaly or masses noted. Motor sensory and DTR levels are equal and symmetric in the upper and lower extremities. Cranial nerves II through XII are grossly intact. Proprioception is intact. No peripheral adenopathy or edema is  identified. No motor or sensory levels are noted. Crude visual fields are within normal range.  RADIOLOGY RESULTS: Mammograms reviewed compatible with above-stated findings RADIOLOGY Mammogram: BI-RADS 1 negative (05/2023)  PLAN: Stage 1A ER positive invasive mammary carcinoma Over three years post whole breast radiation. Currently on Femara. Last mammogram in August was BI-RADS 1 negative. Discussed the low likelihood of recurrence. -Continue Femara as prescribed. -Ensure annual breast exam and mammogram are performed by primary care provider. -Discharge from oncology follow-up, but available for any future concerns.    Carmina Miller, MD

## 2023-09-07 NOTE — Progress Notes (Signed)
Survivorship Care Plan visit completed.  Treatment summary reviewed and given to patient.  ASCO answers booklet reviewed and given to patient.  CARE program and Cancer Transitions discussed with patient along with other resources cancer center offers to patients and caregivers.  Patient verbalized understanding.    

## 2023-09-09 NOTE — Assessment & Plan Note (Addendum)
Presentation consistent with right knee arthritic changes and/or popliteal cyst.  Pending x-ray.  Discussed conservative management including Ace wrap, icing regimen, and scheduling Tylenol arthritis.  Consider tramadol, physical therapy

## 2023-09-12 ENCOUNTER — Telehealth: Payer: Self-pay | Admitting: Family

## 2023-09-12 DIAGNOSIS — M25562 Pain in left knee: Secondary | ICD-10-CM

## 2023-09-12 NOTE — Telephone Encounter (Signed)
Spoke to pt and she stated that she would like to wait on the xray and hold off on [place] because the wrap has really been helping since she started wearing it since her visit with you

## 2023-09-12 NOTE — Telephone Encounter (Signed)
Call patient Still awaiting radiology read left knee x-ray.    On exam when I saw her, she had slightly diminished pulses on her feet.  I recommended an ultrasound of her legs to evaluate for any circulation problems.  This is separate from x-ray.    Would she like to proceed with scheduling this ultrasound?

## 2023-09-12 NOTE — Telephone Encounter (Signed)
LVM to call back to go over results 

## 2023-09-13 NOTE — Telephone Encounter (Signed)
noted 

## 2023-09-19 NOTE — Telephone Encounter (Signed)
 Patient would like to proceed with U/S

## 2023-09-19 NOTE — Telephone Encounter (Signed)
Pt called stating things are not better and she would like to know about getting the ultrasound

## 2023-09-20 NOTE — Telephone Encounter (Signed)
Spoke to pt and went over note below. Pt verbalized understanding pt still is having swelling and pain in her left knee as well but she would like you to put in the referral to PT  I sent her the below xray result note 09/20/23   X-ray shows mild arthritic changes and small effusion (swelling).  I would recommend icing 20 minutes twice daily and use of an Ace wrap for compression. I received a call that you would like to move forward with ultrasound of the legs; this is an test for circulation which I noted decreased pulses on exam.  I do not think that circulation is likely causing the pain in your actual knee however a separate symptom that warrants evaluation. Our office will call you to schedule.  I would recommend a referral to physical therapy.  Please let me know if agreeable and I will place.

## 2023-09-21 NOTE — Addendum Note (Signed)
Addended by: Allegra Grana on: 09/21/2023 12:34 PM   Modules accepted: Orders

## 2023-09-21 NOTE — Telephone Encounter (Signed)
Referral to PT placed

## 2023-09-21 NOTE — Telephone Encounter (Signed)
Pt has been notified.

## 2023-10-03 ENCOUNTER — Ambulatory Visit
Admission: RE | Admit: 2023-10-03 | Discharge: 2023-10-03 | Disposition: A | Discharge status: Home / Self Care | Payer: Medicare Other | Source: Ambulatory Visit | Attending: Family | Admitting: Family

## 2023-10-03 DIAGNOSIS — M25562 Pain in left knee: Secondary | ICD-10-CM | POA: Diagnosis present

## 2023-11-04 ENCOUNTER — Ambulatory Visit: Payer: Self-pay | Admitting: Internal Medicine

## 2023-11-04 NOTE — Telephone Encounter (Signed)
Agree, if pt is having confusion, weakness - then she needs to be seen today.

## 2023-11-04 NOTE — Telephone Encounter (Signed)
Noted. Daughter is aware

## 2023-11-04 NOTE — Telephone Encounter (Signed)
Copied from CRM [id number]. Topic: Clinical - Red Word Triage >> Nov 04, 2023 12:22 PM Deaijah H wrote: Red Word that prompted transfer to Nurse Triage: Possible UTI / fever 100.8  Chief Complaint: urination pain Symptoms: Confusion, increase frequency, pain,with urination, fever Frequency: Tuesday Pertinent Negatives: Patient denies discharge Disposition: [] ED /[] Urgent Care (no appt availability in office) / [] Appointment(In office/virtual)/ []  [facility]/ [] Home Care/ [x] Refused Recommended Disposition /[] [facility]/ []  Follow-up with PCP Additional Notes: Jasmine December pt's daughter [phone] called and reported symptoms on behalf of Mom.  Daughter would like to know if she could bring urine sample by and possibly get a Rx called in for pt.  Daughter would like a call back.  Reason for Disposition  [1] Painful urination AND [2] EITHER frequency or urgency AND [3] has on-call doctor  Answer Assessment - Initial Assessment Questions 1. SEVERITY: "How bad is the pain?"  (e.g., Scale 1-10; mild, moderate, or severe)   - MILD (1-3): complains slightly about urination hurting   - MODERATE (4-7): interferes with normal activities     - SEVERE (8-10): excruciating, unwilling or unable to urinate because of the pain      moderate 2. FREQUENCY: "How many times have you had painful urination today?"      unknown 3. PATTERN: "Is pain present every time you urinate or just sometimes?"      constant 4. ONSET: "When did the painful urination start?"      Tuesday 5. FEVER: "Do you have a fever?" If Yes, ask: "What is your temperature, how was it measured, and when did it start?"     100.8 6. PAST UTI: "Have you had a urine infection before?" If Yes, ask: "When was the last time?" and "What happened that time?"      yes 7. CAUSE: "What do you think is causing the painful urination?"  (e.g., UTI, scratch, Herpes sore)     unknown 8. OTHER SYMPTOMS: "Do you have any other  symptoms?" (e.g., blood in urine, flank pain, genital sores, urgency, vaginal discharge)     Confusion, frequency, pain, fever 9. PREGNANCY: "Is there any chance you are pregnant?" "When was your last menstrual period?"     N/a  Protocols used: Urination Pain - Female-A-AH

## 2023-11-04 NOTE — Telephone Encounter (Signed)
FYI- Called patients daughter to let her know that appt is needed in order to run a urine sample. While on the phone daughter noted that patient is feeling "really weak and swimmy headed" Advised that given her symptoms, she needs to go to the urgent care or ED ASAP to confirm nothing acute is going on and that she does not need to wait until Monday to be seen. Gave advice for [facility] or [facility]. Daughter hung up on me.

## 2023-11-07 ENCOUNTER — Other Ambulatory Visit: Payer: Self-pay

## 2023-11-07 ENCOUNTER — Ambulatory Visit: Payer: Medicare Other | Admitting: Internal Medicine

## 2023-11-07 ENCOUNTER — Encounter: Payer: Self-pay | Admitting: Internal Medicine

## 2023-11-07 ENCOUNTER — Encounter: Payer: Self-pay | Admitting: Emergency Medicine

## 2023-11-07 ENCOUNTER — Emergency Department
Admission: EM | Admit: 2023-11-07 | Discharge: 2023-11-08 | Disposition: A | Discharge status: Home / Self Care | Payer: Medicare Other | Attending: Emergency Medicine | Admitting: Emergency Medicine

## 2023-11-07 VITALS — BP 98/58 | HR 79 | Temp 97.9°F | Resp 16 | Ht 67.0 in | Wt 126.2 lb

## 2023-11-07 DIAGNOSIS — D6859 Other primary thrombophilia: Secondary | ICD-10-CM

## 2023-11-07 DIAGNOSIS — I4891 Unspecified atrial fibrillation: Secondary | ICD-10-CM

## 2023-11-07 DIAGNOSIS — R0602 Shortness of breath: Secondary | ICD-10-CM

## 2023-11-07 DIAGNOSIS — R739 Hyperglycemia, unspecified: Secondary | ICD-10-CM

## 2023-11-07 DIAGNOSIS — N3000 Acute cystitis without hematuria: Secondary | ICD-10-CM | POA: Diagnosis not present

## 2023-11-07 DIAGNOSIS — I10 Essential (primary) hypertension: Secondary | ICD-10-CM | POA: Diagnosis not present

## 2023-11-07 DIAGNOSIS — R531 Weakness: Secondary | ICD-10-CM | POA: Insufficient documentation

## 2023-11-07 DIAGNOSIS — E7800 Pure hypercholesterolemia, unspecified: Secondary | ICD-10-CM

## 2023-11-07 DIAGNOSIS — Z7901 Long term (current) use of anticoagulants: Secondary | ICD-10-CM | POA: Diagnosis not present

## 2023-11-07 LAB — URINALYSIS, ROUTINE W REFLEX MICROSCOPIC
Bilirubin Urine: NEGATIVE
Glucose, UA: NEGATIVE mg/dL
Ketones, ur: NEGATIVE mg/dL
Nitrite: NEGATIVE
Protein, ur: NEGATIVE mg/dL
Specific Gravity, Urine: 1.004 — ABNORMAL LOW (ref 1.005–1.030)
Squamous Epithelial / HPF: 0 /[HPF] (ref 0–5)
WBC, UA: >50 WBC/hpf (ref 0–5)
pH: 6 (ref 5.0–8.0)

## 2023-11-07 LAB — HEPATIC FUNCTION PANEL
ALT: 70 U/L — ABNORMAL HIGH (ref 0–44)
AST: 120 U/L — ABNORMAL HIGH (ref 15–41)
Albumin: 3.4 g/dL — ABNORMAL LOW (ref 3.5–5.0)
Alkaline Phosphatase: 62 U/L (ref 38–126)
Bilirubin, Direct: 0.2 mg/dL (ref 0.0–0.2)
Indirect Bilirubin: 0.3 mg/dL (ref 0.3–0.9)
Total Bilirubin: 0.5 mg/dL (ref 0.0–1.2)
Total Protein: 7.4 g/dL (ref 6.5–8.1)

## 2023-11-07 LAB — LIPASE, BLOOD: Lipase: 24 U/L (ref 11–51)

## 2023-11-07 LAB — BASIC METABOLIC PANEL
Anion gap: 13 (ref 5–15)
BUN: 16 mg/dL (ref 8–23)
CO2: 24 mmol/L (ref 22–32)
Calcium: 9.2 mg/dL (ref 8.9–10.3)
Chloride: 94 mmol/L — ABNORMAL LOW (ref 98–111)
Creatinine, Ser: 0.95 mg/dL (ref 0.44–1.00)
GFR, Estimated: 58 mL/min — ABNORMAL LOW (ref >60)
Glucose, Bld: 136 mg/dL — ABNORMAL HIGH (ref 70–99)
Potassium: 3.4 mmol/L — ABNORMAL LOW (ref 3.5–5.1)
Sodium: 131 mmol/L — ABNORMAL LOW (ref 135–145)

## 2023-11-07 LAB — CBC
HCT: 35 % — ABNORMAL LOW (ref 36.0–46.0)
Hemoglobin: 12.1 g/dL (ref 12.0–15.0)
MCH: 32.6 pg (ref 26.0–34.0)
MCHC: 34.6 g/dL (ref 30.0–36.0)
MCV: 94.3 fL (ref 80.0–100.0)
Platelets: 285 10*3/uL (ref 150–400)
RBC: 3.71 MIL/uL — ABNORMAL LOW (ref 3.87–5.11)
RDW: 12.8 % (ref 11.5–15.5)
WBC: 10.9 10*3/uL — ABNORMAL HIGH (ref 4.0–10.5)
nRBC: 0 % (ref 0.0–0.2)

## 2023-11-07 MED ORDER — ONDANSETRON 4 MG PO TBDP: 4.0000 mg | ORAL_TABLET | Freq: Three times a day (TID) | ORAL | 0 refills | Status: AC | PRN

## 2023-11-07 MED ORDER — LACTATED RINGERS IV BOLUS
1000.0000 mL | Freq: Once | INTRAVENOUS | Status: AC
  Administered 2023-11-07: 1000 mL via INTRAVENOUS

## 2023-11-07 MED ORDER — SODIUM CHLORIDE 0.9 % IV SOLN
1.0000 g | Freq: Once | INTRAVENOUS | Status: AC
  Administered 2023-11-07: 1 g via INTRAVENOUS
  Filled 2023-11-07: qty 10

## 2023-11-07 MED ORDER — ONDANSETRON HCL 4 MG/2ML IJ SOLN
4.0000 mg | Freq: Once | INTRAMUSCULAR | Status: AC
  Administered 2023-11-07: 4 mg via INTRAVENOUS
  Filled 2023-11-07: qty 2

## 2023-11-07 MED ORDER — CEPHALEXIN 500 MG PO CAPS: 500.0000 mg | ORAL_CAPSULE | Freq: Four times a day (QID) | ORAL | 0 refills | Status: AC

## 2023-11-07 NOTE — ED Provider Notes (Signed)
[facility] Provider Note    Event Date/Time   First MD Initiated Contact with Patient 11/07/23 2129     (approximate)   History   Chief Complaint Orthostatic Hypotension   HPI  Anita Benjamin is a 89 y.o. female with past medical history of hypertension, hyperlipidemia, anemia, and atrial fibrillation on Eliquis who presents to the ED for orthostatic hypotension.  Patient patient reports that she has been feeling generally weak and malaised for the past 2 days.  She has been feeling nauseous with decreased oral intake during this time, denies any vomiting or diarrhea.  She has not had any fevers, dysuria, or flank pain, but reports a home test that was positive for UTI.  She had initially reported some difficulty breathing but now denies this, also denies any pain in her chest.  She was seen at her PCPs office earlier today, found to have orthostatic hypotension referred to the ED for further evaluation.     Physical Exam   Triage Vital Signs: ED Triage Vitals  Encounter Vitals Group     BP 11/07/23 1611 127/68     Systolic BP Percentile --      Diastolic BP Percentile --      Pulse Rate 11/07/23 1611 72     Resp 11/07/23 1611 16     Temp 11/07/23 1611 98.6 F (37 C)     Temp Source 11/07/23 1611 Oral     SpO2 11/07/23 1611 98 %     Weight 11/07/23 1612 126 lb (57.2 kg)     Height 11/07/23 1612 5\' 6"  (1.676 m)     Head Circumference --      Peak Flow --      Pain Score 11/07/23 1612 0     Pain Loc --      Pain Education --      Exclude from Growth Chart --     Most recent vital signs: Vitals:   11/07/23 2224 11/07/23 2225  BP: 124/67 120/63  Pulse: 61 70  Resp: 17 17  Temp:    SpO2: 99% 96%    Constitutional: Alert and oriented. Eyes: Conjunctivae are normal. Head: Atraumatic. Nose: No congestion/rhinnorhea. Mouth/Throat: Mucous membranes are moist.  Cardiovascular: Normal rate, regular rhythm. Grossly normal heart sounds.  2+ radial  pulses bilaterally. Respiratory: Normal respiratory effort.  No retractions. Lungs CTAB. Gastrointestinal: Soft and nontender. No distention.  No CVA tenderness bilaterally. Musculoskeletal: No lower extremity tenderness nor edema.  Neurologic:  Normal speech and language. No gross focal neurologic deficits are appreciated.    ED Results / Procedures / Treatments   Labs (all labs ordered are listed, but only abnormal results are displayed) Labs Reviewed  BASIC METABOLIC PANEL - Abnormal; Notable for the following components:      Result Value   Sodium 131 (*)    Potassium 3.4 (*)    Chloride 94 (*)    Glucose, Bld 136 (*)    GFR, Estimated 58 (*)    All other components within normal limits  CBC - Abnormal; Notable for the following components:   WBC 10.9 (*)    RBC 3.71 (*)    HCT 35.0 (*)    All other components within normal limits  URINALYSIS, ROUTINE W REFLEX MICROSCOPIC - Abnormal; Notable for the following components:   Color, Urine YELLOW (*)    APPearance CLOUDY (*)    Specific Gravity, Urine 1.004 (*)    Hgb urine dipstick MODERATE (*)  Leukocytes,Ua LARGE (*)    Bacteria, UA RARE (*)    All other components within normal limits  HEPATIC FUNCTION PANEL - Abnormal; Notable for the following components:   Albumin 3.4 (*)    AST 120 (*)    ALT 70 (*)    All other components within normal limits  URINE CULTURE  LIPASE, BLOOD     EKG  ED ECG REPORT I, Chesley Noon, the attending physician, personally viewed and interpreted this ECG.   Date: 11/07/2023  EKG Time: 16:18  Rate: 72  Rhythm: normal sinus rhythm, PACs  Axis: Normal  Intervals:none  ST&T Change: None  PROCEDURES:  Critical Care performed: No  Procedures   MEDICATIONS ORDERED IN ED: Medications  lactated ringers bolus 1,000 mL (has no administration in time range)  cefTRIAXone (ROCEPHIN) 1 g in sodium chloride 0.9 % 100 mL IVPB (1 g Intravenous New Bag/Given 11/07/23 2259)   ondansetron (ZOFRAN) injection 4 mg (4 mg Intravenous Given 11/07/23 2258)     IMPRESSION / MDM / ASSESSMENT AND PLAN / ED COURSE  I reviewed the triage vital signs and the nursing notes.                              89 y.o. female with past medical history of hypertension, hyperlipidemia, atrial fibrillation on Eliquis, and anemia who presents to the ED for generalized weakness with nausea and decreased oral intake for the past 2 days.  Patient's presentation is most consistent with acute presentation with potential threat to life or bodily function.  Differential diagnosis includes, but is not limited to, UTI, arrhythmia, anemia, electrolyte abnormality, AKI, viral syndrome, dehydration, orthostatic hypotension.  Patient nontoxic-appearing and in no acute distress, vital signs are unremarkable.  She has a benign abdominal exam with no CVA tenderness, urinalysis does appear concerning for UTI and we will send for culture.  Labs without significant anemia, leukocytosis, electrolyte abnormality, or AKI.  She does have a mild transaminitis but no elevation in bilirubin or alkaline phosphatase to suggest biliary obstruction and no right upper quadrant tenderness.  Patient was given IV fluids along with dose of IV Rocephin, now reports feeling better.  We will recheck orthostatic vital signs, if these are improved then she would be appropriate for discharge home with outpatient follow-up.  Patient turned over to oncoming provider pending recheck of orthostatic vital signs, following IVF.      FINAL CLINICAL IMPRESSION(S) / ED DIAGNOSES   Final diagnoses:  Acute cystitis without hematuria  Generalized weakness     Rx / DC Orders   ED Discharge Orders          Ordered    cephALEXin (KEFLEX) 500 MG capsule  4 times daily        11/07/23 2331    ondansetron (ZOFRAN-ODT) 4 MG disintegrating tablet  Every 8 hours PRN        11/07/23 2331             Note:  This document was  prepared using Dragon voice recognition software and may include unintentional dictation errors.   Chesley Noon, MD 11/07/23 [phone]

## 2023-11-07 NOTE — Assessment & Plan Note (Signed)
SR - with PACs  -EKG. Has been off her medication this weekend. Stopped taking.

## 2023-11-07 NOTE — ED Notes (Signed)
First Nurse Note: Pt to ED via [facility] from urgent care. Pt being seen for weakness. Pt had an at home positive UTI test. Pt c/o ShOB. Pt had EKG, per urgent care EKG was normal. VSS.

## 2023-11-07 NOTE — Assessment & Plan Note (Signed)
Weakness as outlined. Orthostatic on exam. Previous urinary frequency. Previous vomiting. Decreased appetite. Given orthostatic on exam and weakness, feel needs further evaluation and treatment in ER. Discussed need for labs, urine and cxr. Also discussed possible need for IVFs. Agreeable.  Requested EMS to transport.

## 2023-11-07 NOTE — Progress Notes (Signed)
Subjective:    Patient ID: Anita Benjamin, female    DOB: [birth year], 89 y.o.   MRN: [id number]  Patient here for  Chief Complaint  Patient presents with   Fatigue    HPI Here for a scheduled follow up. Had f/u with Dr Maia Plan 05/17/23 - f/u breast cancer surveillance. F/u mammogram 05/10/23 - ok. Recommended f/u in one year. Continues on letrozole. Had f/u with Dr Mariah Milling 05/03/23 - f/u atrial flutter.  Continues on eliquis.  Has propranolol to take prn.  Per review, had previously noted thyroid nodules on carotid ultrasound.  F/u thyroid ultrasound revealed - mild thyromegaly with bilateral cysts and complex cysts.  Per report, none met criteria for biopsy or follow up. Had f/u with Dr Jayme Cloud 07/20/23 - f/u cough variant asthma. On advair. She comes in today with increased weakness, dizziness and not feeling well. Reports fever over the weekend of 101-102. 102 this am. Taking tylenol. Some persistent drainage. No increased headache or sinus pressure. No chest congestion. Does report increased sob. Also report emesis - several times this weekend. No diarrhea. No abdominal pain. Did notice at the end of last week, some increased urinary frequency. No dysuria. No increased frequency now. Decreased appetite. Not eating.    Past Medical History:  Diagnosis Date   Anemia    Atrial fibrillation (HCC)    Atrial flutter (HCC)    Fibrocystic breast disease    GERD (gastroesophageal reflux disease)    History of chicken pox    Hypercholesterolemia    Hypertension    Past Surgical History:  Procedure Laterality Date   BREAST BIOPSY Right 2014   NEG   BREAST BIOPSY Right 05/24/2022   stereo bx asymmetry, x marker, benign   BREAST BIOPSY Right 05/25/2022   [place] Core BX, Coil Clip - benign   BREAST LUMPECTOMY     BREAST LUMPECTOMY WITH SENTINEL LYMPH NODE BIOPSY Left 06/04/2020   Procedure: BREAST LUMPECTOMY WITH SENTINEL LYMPH NODE BX;  Surgeon: Earline Mayotte, MD;  Location: [facility] ORS;  Service:  General;  Laterality: Left;   CATARACT EXTRACTION     DILATION AND CURETTAGE OF UTERUS     Laser repair of torn retina     Family History  Problem Relation Age of Onset   Heart disease Mother 64       Heart attack   Cancer Father 58       lung cancer   Breast cancer Paternal Aunt        x3   Colon cancer Neg Hx    Social History   Socioeconomic History   Marital status: Widowed    Spouse name: Not on file   Number of children: 2   Years of education: Not on file   Highest education level: Not on file  Occupational History   Not on file  Tobacco Use   Smoking status: Never   Smokeless tobacco: Never  Vaping Use   Vaping status: Never Used  Substance and Sexual Activity   Alcohol use: No    Alcohol/week: 0.0 standard drinks of alcohol   Drug use: No   Sexual activity: Not on file  Other Topics Concern   Not on file  Social History Narrative   Not on file   Social Drivers of Health   Financial Resource Strain: Low Risk  (02/13/2018)   Overall Financial Resource Strain (CARDIA)    Difficulty of Paying Living Expenses: Not hard at all  Food Insecurity: No Food  Insecurity (02/13/2018)   Hunger Vital Sign    Worried About Running Out of Food in the Last Year: Never true    Ran Out of Food in the Last Year: Never true  Transportation Needs: No Transportation Needs (02/13/2018)   PRAPARE - Administrator, Civil Service (Medical): No    Lack of Transportation (Non-Medical): No  Physical Activity: Unknown (02/13/2018)   Exercise Vital Sign    Days of Exercise per Week: 0 days    Minutes of Exercise per Session: Not on file  Stress: No Stress Concern Present (02/13/2018)   [employer] of Occupational Health - Occupational Stress Questionnaire    Feeling of Stress : Not at all  Social Connections: Not on file     Review of Systems  Constitutional:  Positive for appetite change, fatigue and fever.  HENT:  Positive for postnasal drip.   Respiratory:   Positive for shortness of breath. Negative for cough and chest tightness.   Cardiovascular:  Negative for chest pain, palpitations and leg swelling.  Gastrointestinal:  Positive for vomiting. Negative for abdominal pain.  Genitourinary:        Previous increased urinary frequency.   Musculoskeletal:  Negative for joint swelling and myalgias.  Skin:  Negative for color change and rash.  Neurological:  Positive for dizziness and light-headedness. Negative for headaches.  Psychiatric/Behavioral:  Negative for dysphoric mood.        Objective:     BP (!) 98/58   Pulse 79   Temp 97.9 F (36.6 C)   Resp 16   Ht 5\' 7"  (1.702 m)   Wt 126 lb 3.2 oz (57.2 kg)   LMP 10/27/1989   SpO2 98%   BMI 19.77 kg/m  Wt Readings from Last 3 Encounters:  11/07/23 126 lb 3.2 oz (57.2 kg)  09/07/23 129 lb (58.5 kg)  09/06/23 130 lb 4 oz (59.1 kg)    Physical Exam Vitals reviewed.  Constitutional:      General: She is not in acute distress.    Appearance: Normal appearance.  HENT:     Head: Normocephalic and atraumatic.     Right Ear: External ear normal.     Left Ear: External ear normal.     Mouth/Throat:     Pharynx: No oropharyngeal exudate or posterior oropharyngeal erythema.  Eyes:     General: No scleral icterus.       Right eye: No discharge.        Left eye: No discharge.     Conjunctiva/sclera: Conjunctivae normal.  Neck:     Thyroid: No thyromegaly.  Cardiovascular:     Rate and Rhythm: Normal rate.     Comments: Regular with premature beats. Pulmonary:     Effort: No respiratory distress.     Breath sounds: Normal breath sounds. No wheezing.  Abdominal:     General: Bowel sounds are normal.     Palpations: Abdomen is soft.     Tenderness: There is no abdominal tenderness.  Musculoskeletal:        General: No swelling or tenderness.     Cervical back: Neck supple. No tenderness.  Lymphadenopathy:     Cervical: No cervical adenopathy.  Skin:    Findings: No erythema  or rash.  Neurological:     Mental Status: She is alert.  Psychiatric:        Mood and Affect: Mood normal.        Behavior: Behavior normal.  Outpatient Encounter Medications as of 11/07/2023  Medication Sig   acetaminophen (TYLENOL) 650 MG CR tablet Take 650 mg by mouth every 8 (eight) hours as needed for pain.   Albuterol Sulfate (PROAIR RESPICLICK) 108 (90 Base) MCG/ACT AEPB Inhale 2 puffs into the lungs every 6 (six) hours as needed.   amLODipine (NORVASC) 5 MG tablet TAKE 1 TABLET BY MOUTH ONCE DAILY AT DINNER TIME   apixaban (ELIQUIS) 2.5 MG TABS tablet Take 1 tablet (2.5 mg total) by mouth 2 (two) times daily.   Ascorbic Acid (VITAMIN C) 250 MG CHEW Chew 250 mg by mouth daily.    calcium citrate-vitamin D 500-500 MG-UNIT chewable tablet Chew 3 tablets by mouth daily.   Cranberry-Vitamin C-Probiotic (AZO CRANBERRY PO) Take 1 tablet by mouth daily.   fluticasone-salmeterol (ADVAIR) 100-50 MCG/ACT AEPB Inhale 1 puff into the lungs 2 (two) times daily.   letrozole (FEMARA) 2.5 MG tablet Take 2.5 mg by mouth daily.   losartan (COZAAR) 100 MG tablet TAKE 1 TABLET BY MOUTH ONCE DAILY   Multiple Vitamin (MULTIVITAMIN) tablet Take 1 tablet by mouth daily.   mupirocin ointment (BACTROBAN) 2 % Apply 1 Application topically 2 (two) times daily.   Probiotic Product (PROBIOTIC ADVANCED PO) Take 1 capsule by mouth daily.   propranolol (INDERAL) 10 MG tablet Take 1 tablet (10 mg total) by mouth 3 (three) times daily as needed.   rosuvastatin (CRESTOR) 5 MG tablet Take 1 tablet (5 mg total) by mouth every evening. TAKE 1 TABLET BY MOUTH ONCE EVERY EVENING   sertraline (ZOLOFT) 50 MG tablet Take 1 tablet (50 mg total) by mouth daily. Take 1 tablet q am   No facility-administered encounter medications on file as of 11/07/2023.     Lab Results  Component Value Date   WBC 5.2 07/12/2023   HGB 13.4 07/12/2023   HCT 40.8 07/12/2023   PLT 266.0 07/12/2023   GLUCOSE 94 07/12/2023   CHOL  165 07/12/2023   TRIG 146.0 07/12/2023   HDL 76.60 07/12/2023   LDLDIRECT 118.0 10/06/2022   LDLCALC 59 07/12/2023   ALT 15 07/12/2023   AST 23 07/12/2023   NA 139 07/12/2023   K 4.7 07/12/2023   CL 101 07/12/2023   CREATININE 0.97 07/12/2023   BUN 19 07/12/2023   CO2 30 07/12/2023   TSH 4.88 07/12/2023   HGBA1C 5.7 07/12/2023    US ARTERIAL ABI (SCREENING LOWER EXTREMITY) Result Date: 10/04/2023 CLINICAL DATA:  89 year old female with a history of claudication EXAM: NONINVASIVE PHYSIOLOGIC VASCULAR STUDY OF BILATERAL LOWER EXTREMITIES TECHNIQUE: Evaluation of both lower extremities was performed at rest, including calculation of ankle-brachial indices, multiple segmental pressure evaluation, segmental Doppler and segmental pulse volume recording. COMPARISON:  None Available. FINDINGS: Right ABI:  1.17 Left ABI:  1.14 Right Lower Extremity: Segmental Doppler at the right ankle demonstrates multiphasic waveforms Left Lower Extremity: Segmental Doppler at the left ankle demonstrates multiphasic waveforms IMPRESSION: Resting ABI the bilateral lower extremity within normal limits. Segmental Doppler at the bilateral ankles demonstrates waveforms maintained Signed, Yvone Neu. Miachel Roux, RPVI [facility] Radiology Electronically Signed   By: Gilmer Mor D.O.   On: 10/04/2023 15:10       Assessment & Plan:  Hypercholesterolemia -     Hepatic function panel; Future -     Lipid panel; Future  Hyperglycemia -     Hemoglobin A1c; Future  Essential hypertension -     Basic metabolic panel; Future  SOB (  shortness of breath) Assessment & Plan: Increased sob.  Off her medication. EKG - SR with PACs. Discussed the need for ER evaluation and treatment.  Pt agreeable.  Discussed cxr and further cardiac monitoring.   Orders: -     EKG 12-Lead  Atrial fibrillation, unspecified type (HCC) Assessment & Plan: SR - with PACs  -EKG. Has been  off her medication this weekend. Stopped taking.    Weakness Assessment & Plan: Weakness as outlined. Orthostatic on exam. Previous urinary frequency. Previous vomiting. Decreased appetite. Given orthostatic on exam and weakness, feel needs further evaluation and treatment in ER. Discussed need for labs, urine and cxr. Also discussed possible need for IVFs. Agreeable.  Requested EMS to transport.    Thrombophilia (HCC) Assessment & Plan: Takes eliquis. Has been off medication this weekend.       [place], MD

## 2023-11-07 NOTE — Assessment & Plan Note (Signed)
Increased sob.  Off her medication. EKG - SR with PACs. Discussed the need for ER evaluation and treatment.  Pt agreeable.  Discussed cxr and further cardiac monitoring.

## 2023-11-07 NOTE — ED Triage Notes (Signed)
Pt in via [facility]; sent over from PCP office due to orthostatic hypotension.  Patient complaints of generalized weakness w/ some N/V over the last couple of days.  Vitals WDL, NAD noted at this time.

## 2023-11-07 NOTE — Discharge Instructions (Addendum)
Take and finish antibiotic as prescribed.  You may take Zofran as needed for nausea.  Drink plenty of fluids daily.  Return to the ER for worsening symptoms, persistent vomiting, difficulty breathing or other concerns.

## 2023-11-07 NOTE — Assessment & Plan Note (Signed)
Takes eliquis. Has been off medication this weekend.

## 2023-11-08 NOTE — ED Provider Notes (Signed)
-----------------------------------------   1:41 AM on 11/08/2023 -----------------------------------------   Patient feeling significantly better after administration of IV fluids.  She ambulated back and forth to the restroom without dizziness.  Antibiotic prescription sent by previous provider.  Patient is in good and stable condition for discharge home.  Strict return precautions given.  Patient verbalizes understanding and agrees with plan of care.   Sylvie Mifsud J, MD 11/08/23 [phone]

## 2023-11-08 NOTE — ED Notes (Signed)
Pt ambulated to the bathroom without any complaints. VSS.

## 2023-11-10 ENCOUNTER — Telehealth: Payer: Self-pay

## 2023-11-10 LAB — URINE CULTURE: Culture: >=100000 — AB

## 2023-11-10 NOTE — Telephone Encounter (Signed)
Pt will keep appointment

## 2023-11-10 NOTE — Telephone Encounter (Signed)
--  Ok to keep f/u.

## 2023-11-10 NOTE — Telephone Encounter (Signed)
 Do you want to do her appt as ED f/u since it was a previously scheduled f/u?

## 2023-11-10 NOTE — Telephone Encounter (Signed)
 Copied from CRM [id number]. Topic: General - Other >> Nov 10, 2023 10:08 AM Crist Dominion wrote: Reason for CRM: Patient is requesting to speak with Dr. Thayne Fine nurse as she needs to know if she should keep her appointment on 2/10

## 2023-11-14 ENCOUNTER — Ambulatory Visit: Payer: Medicare Other | Admitting: Internal Medicine

## 2023-11-14 ENCOUNTER — Encounter: Payer: Self-pay | Admitting: Internal Medicine

## 2023-11-14 VITALS — BP 116/68 | HR 60 | Temp 98.0°F | Resp 16 | Ht 66.0 in | Wt 131.2 lb

## 2023-11-14 DIAGNOSIS — I10 Essential (primary) hypertension: Secondary | ICD-10-CM | POA: Diagnosis not present

## 2023-11-14 DIAGNOSIS — D6859 Other primary thrombophilia: Secondary | ICD-10-CM

## 2023-11-14 DIAGNOSIS — C50919 Malignant neoplasm of unspecified site of unspecified female breast: Secondary | ICD-10-CM

## 2023-11-14 DIAGNOSIS — L989 Disorder of the skin and subcutaneous tissue, unspecified: Secondary | ICD-10-CM | POA: Insufficient documentation

## 2023-11-14 DIAGNOSIS — E871 Hypo-osmolality and hyponatremia: Secondary | ICD-10-CM

## 2023-11-14 DIAGNOSIS — F439 Reaction to severe stress, unspecified: Secondary | ICD-10-CM

## 2023-11-14 DIAGNOSIS — I4891 Unspecified atrial fibrillation: Secondary | ICD-10-CM | POA: Diagnosis not present

## 2023-11-14 DIAGNOSIS — R300 Dysuria: Secondary | ICD-10-CM | POA: Diagnosis not present

## 2023-11-14 DIAGNOSIS — E7800 Pure hypercholesterolemia, unspecified: Secondary | ICD-10-CM | POA: Diagnosis not present

## 2023-11-14 DIAGNOSIS — R531 Weakness: Secondary | ICD-10-CM

## 2023-11-14 DIAGNOSIS — I700 Atherosclerosis of aorta: Secondary | ICD-10-CM

## 2023-11-14 DIAGNOSIS — R7989 Other specified abnormal findings of blood chemistry: Secondary | ICD-10-CM

## 2023-11-14 DIAGNOSIS — R059 Cough, unspecified: Secondary | ICD-10-CM

## 2023-11-14 LAB — CBC WITH DIFFERENTIAL/PLATELET
Basophils Absolute: 0.1 10*3/uL (ref 0.0–0.1)
Basophils Relative: 0.8 % (ref 0.0–3.0)
Eosinophils Absolute: 0.2 10*3/uL (ref 0.0–0.7)
Eosinophils Relative: 2.9 % (ref 0.0–5.0)
HCT: 32.4 % — ABNORMAL LOW (ref 36.0–46.0)
Hemoglobin: 11 g/dL — ABNORMAL LOW (ref 12.0–15.0)
Lymphocytes Relative: 15.1 % (ref 12.0–46.0)
Lymphs Abs: 1.1 10*3/uL (ref 0.7–4.0)
MCHC: 34 g/dL (ref 30.0–36.0)
MCV: 96.2 fL (ref 78.0–100.0)
Monocytes Absolute: 0.6 10*3/uL (ref 0.1–1.0)
Monocytes Relative: 8.2 % (ref 3.0–12.0)
Neutro Abs: 5.5 10*3/uL (ref 1.4–7.7)
Neutrophils Relative %: 73 % (ref 43.0–77.0)
Platelets: 492 10*3/uL — ABNORMAL HIGH (ref 150.0–400.0)
RBC: 3.37 Mil/uL — ABNORMAL LOW (ref 3.87–5.11)
RDW: 13.6 % (ref 11.5–15.5)
WBC: 7.5 10*3/uL (ref 4.0–10.5)

## 2023-11-14 LAB — BASIC METABOLIC PANEL
BUN: 16 mg/dL (ref 6–23)
CO2: 31 meq/L (ref 19–32)
Calcium: 9.3 mg/dL (ref 8.4–10.5)
Chloride: 101 meq/L (ref 96–112)
Creatinine, Ser: 0.83 mg/dL (ref 0.40–1.20)
GFR: 63.27 mL/min (ref >60.00)
Glucose, Bld: 90 mg/dL (ref 70–99)
Potassium: 5.3 meq/L — ABNORMAL HIGH (ref 3.5–5.1)
Sodium: 139 meq/L (ref 135–145)

## 2023-11-14 LAB — HEPATIC FUNCTION PANEL
ALT: 33 U/L (ref 0–35)
AST: 25 U/L (ref 0–37)
Albumin: 3.8 g/dL (ref 3.5–5.2)
Alkaline Phosphatase: 77 U/L (ref 39–117)
Bilirubin, Direct: 0.1 mg/dL (ref 0.0–0.3)
Total Bilirubin: 0.5 mg/dL (ref 0.2–1.2)
Total Protein: 6.5 g/dL (ref 6.0–8.3)

## 2023-11-14 MED ORDER — MUPIROCIN 2 % EX OINT: 1.0000 | TOPICAL_OINTMENT | Freq: Two times a day (BID) | CUTANEOUS | 0 refills | Status: AC

## 2023-11-14 NOTE — Assessment & Plan Note (Signed)
 Continue crestor

## 2023-11-14 NOTE — Assessment & Plan Note (Signed)
 Takes eliquis . Continue.

## 2023-11-14 NOTE — Assessment & Plan Note (Signed)
 Sodium low in ER. Given IVFs. Eating better. Recheck metabolic panel today.

## 2023-11-14 NOTE — Progress Notes (Addendum)
 Subjective:    Patient ID: Anita Benjamin, female    DOB: 08-07-1934, 89 y.o.   MRN: [id number]  Patient here for  Chief Complaint  Patient presents with  . Medical Management of Chronic Issues    HPI Here for scheduled follow up - ER follow up. .was seen 11/07/23 - increased weakness, dizziness and not feeling well - with reported fever, vomiting and increased urinary frequency. Found to be orthostatic on exam. Was sent to ER. Given IVFs. Sodium low. Liver enzymes elevated. Given dose of IV rocephin . Diagnosed with UTI and discharged on keflex . She finishes her abx today. Urinary symptoms have improved, but she is still noticing some dysuria. Also reports nocturia. Energy is gradually improving. No vomiting. Had increased diarrhea one week ago. No further diarrhea. Had more formed bowel movement this am. No abdominal pain. Had f/u with Dr Charmel Cooter 05/17/23 - f/u breast cancer surveillance. F/u mammogram 05/10/23 - ok. Recommended f/u in one year. Continues on letrozole. Had f/u with Dr Gollan 05/03/23 - f/u atrial flutter.  Continues on eliquis .  Has propranolol  to take prn.  Per review, had previously noted thyroid  nodules on carotid ultrasound.  F/u thyroid  ultrasound revealed - mild thyromegaly with bilateral cysts and complex cysts.  Per report, none met criteria for biopsy or follow up. Had f/u with Dr Viva Grise 07/20/23 - f/u cough variant asthma. On advair. Feels breathing is stable. No increased cough or congestion.    Past Medical History:  Diagnosis Date  . Anemia   . Atrial fibrillation (HCC)   . Atrial flutter (HCC)   . Fibrocystic breast disease   . GERD (gastroesophageal reflux disease)   . History of chicken pox   . Hypercholesterolemia   . Hypertension    Past Surgical History:  Procedure Laterality Date  . BREAST BIOPSY Right 2014   NEG  . BREAST BIOPSY Right 05/24/2022   stereo bx asymmetry, x marker, benign  . BREAST BIOPSY Right 05/25/2022   US  Core BX, Coil Clip - benign   . BREAST LUMPECTOMY    . BREAST LUMPECTOMY WITH SENTINEL LYMPH NODE BIOPSY Left 06/04/2020   Procedure: BREAST LUMPECTOMY WITH SENTINEL LYMPH NODE BX;  Surgeon: Marshall Skeeter, MD;  Location: [facility] ORS;  Service: General;  Laterality: Left;  . CATARACT EXTRACTION    . DILATION AND CURETTAGE OF UTERUS    . Laser repair of torn retina     Family History  Problem Relation Age of Onset  . Heart disease Mother 10       Heart attack  . Cancer Father 55       lung cancer  . Breast cancer Paternal Aunt        x3  . Colon cancer Neg Hx    Social History   Socioeconomic History  . Marital status: Widowed    Spouse name: Not on file  . Number of children: 2  . Years of education: Not on file  . Highest education level: Not on file  Occupational History  . Not on file  Tobacco Use  . Smoking status: Never  . Smokeless tobacco: Never  Vaping Use  . Vaping status: Never Used  Substance and Sexual Activity  . Alcohol use: No    Alcohol/week: 0.0 standard drinks of alcohol  . Drug use: No  . Sexual activity: Not on file  Other Topics Concern  . Not on file  Social History Narrative  . Not on file   Social Drivers of  Health   Financial Resource Strain: Low Risk  (02/13/2018)   Overall Financial Resource Strain (CARDIA)   . Difficulty of Paying Living Expenses: Not hard at all  Food Insecurity: No Food Insecurity (02/13/2018)   Hunger Vital Sign   . Worried About Programme researcher, broadcasting/film/video in the Last Year: Never true   . Ran Out of Food in the Last Year: Never true  Transportation Needs: No Transportation Needs (02/13/2018)   PRAPARE - Transportation   . Lack of Transportation (Medical): No   . Lack of Transportation (Non-Medical): No  Physical Activity: Unknown (02/13/2018)   Exercise Vital Sign   . Days of Exercise per Week: 0 days   . Minutes of Exercise per Session: Not on file  Stress: No Stress Concern Present (02/13/2018)   [employer] of Occupational Health -  Occupational Stress Questionnaire   . Feeling of Stress : Not at all  Social Connections: Not on file     Review of Systems  Constitutional:  Positive for fatigue. Negative for unexpected weight change.  HENT:  Negative for congestion and sinus pressure.        Nasal lesions.   Respiratory:  Negative for chest tightness and shortness of breath.        Some cough, but overall better. Feels breathing is stable. Denies any increased sob.   Cardiovascular:  Negative for chest pain, palpitations and leg swelling.  Gastrointestinal:  Negative for abdominal pain, nausea and vomiting.  Genitourinary:  Positive for dysuria.       Nocturia.   Musculoskeletal:  Negative for joint swelling and myalgias.  Skin:  Negative for color change and rash.  Neurological:  Negative for dizziness, light-headedness and headaches.  Psychiatric/Behavioral:  Negative for agitation and dysphoric mood.        Objective:     BP 116/68   Pulse 60   Temp 98 F (36.7 C)   Resp 16   Ht 5\' 6"  (1.676 m)   Wt 131 lb 3.2 oz (59.5 kg)   LMP 10/27/1989   SpO2 98%   BMI 21.18 kg/m  Wt Readings from Last 3 Encounters:  11/14/23 131 lb 3.2 oz (59.5 kg)  11/07/23 126 lb (57.2 kg)  11/07/23 126 lb 3.2 oz (57.2 kg)    Physical Exam Vitals reviewed.  Constitutional:      General: She is not in acute distress.    Appearance: Normal appearance.  HENT:     Head: Normocephalic and atraumatic.     Right Ear: External ear normal.     Left Ear: External ear normal.     Nose:     Comments: External nasal lesions - scabbed.  Eyes:     General: No scleral icterus.       Right eye: No discharge.        Left eye: No discharge.     Conjunctiva/sclera: Conjunctivae normal.  Neck:     Thyroid : No thyromegaly.  Cardiovascular:     Rate and Rhythm: Normal rate and regular rhythm.  Pulmonary:     Effort: No respiratory distress.     Breath sounds: Normal breath sounds. No wheezing.  Abdominal:     General: Bowel  sounds are normal.     Palpations: Abdomen is soft.     Tenderness: There is no abdominal tenderness.  Musculoskeletal:        General: No swelling or tenderness.     Cervical back: Neck supple. No tenderness.  Lymphadenopathy:  Cervical: No cervical adenopathy.  Skin:    Findings: No erythema or rash.  Neurological:     Mental Status: She is alert.  Psychiatric:        Mood and Affect: Mood normal.        Behavior: Behavior normal.        Outpatient Encounter Medications as of 11/14/2023  Medication Sig  . acetaminophen  (TYLENOL ) 650 MG CR tablet Take 650 mg by mouth every 8 (eight) hours as needed for pain.  . Albuterol  Sulfate (PROAIR  RESPICLICK) 108 (90 Base) MCG/ACT AEPB Inhale 2 puffs into the lungs every 6 (six) hours as needed.  . amLODipine  (NORVASC ) 5 MG tablet TAKE 1 TABLET BY MOUTH ONCE DAILY AT [facility] TIME  . apixaban  (ELIQUIS ) 2.5 MG TABS tablet Take 1 tablet (2.5 mg total) by mouth 2 (two) times daily.  . Ascorbic Acid  (VITAMIN C) 250 MG CHEW Chew 250 mg by mouth daily.   . calcium  citrate-vitamin D  500-500 MG-UNIT chewable tablet Chew 3 tablets by mouth daily.  . cephALEXin  (KEFLEX ) 500 MG capsule Take 1 capsule (500 mg total) by mouth 4 (four) times daily for 7 days.  . Cranberry-Vitamin C-Probiotic (AZO CRANBERRY PO) Take 1 tablet by mouth daily.  . fluticasone -salmeterol (ADVAIR) 100-50 MCG/ACT AEPB Inhale 1 puff into the lungs 2 (two) times daily.  Aaron Aas letrozole (FEMARA) 2.5 MG tablet Take 2.5 mg by mouth daily.  . losartan  (COZAAR ) 100 MG tablet TAKE 1 TABLET BY MOUTH ONCE DAILY  . Multiple Vitamin (MULTIVITAMIN) tablet Take 1 tablet by mouth daily.  . mupirocin  ointment (BACTROBAN ) 2 % Apply 1 Application topically 2 (two) times daily.  . ondansetron  (ZOFRAN -ODT) 4 MG disintegrating tablet Take 1 tablet (4 mg total) by mouth every 8 (eight) hours as needed for nausea or vomiting.  . Probiotic Product (PROBIOTIC ADVANCED PO) Take 1 capsule by mouth daily.  .  propranolol  (INDERAL ) 10 MG tablet Take 1 tablet (10 mg total) by mouth 3 (three) times daily as needed.  . rosuvastatin  (CRESTOR ) 5 MG tablet Take 1 tablet (5 mg total) by mouth every evening. TAKE 1 TABLET BY MOUTH ONCE EVERY EVENING  . sertraline  (ZOLOFT ) 50 MG tablet Take 1 tablet (50 mg total) by mouth daily. Take 1 tablet q am  . [DISCONTINUED] mupirocin  ointment (BACTROBAN ) 2 % Apply 1 Application topically 2 (two) times daily.   No facility-administered encounter medications on file as of 11/14/2023.     Lab Results  Component Value Date   WBC 10.9 (H) 11/07/2023   HGB 12.1 11/07/2023   HCT 35.0 (L) 11/07/2023   PLT 285 11/07/2023   GLUCOSE 136 (H) 11/07/2023   CHOL 165 07/12/2023   TRIG 146.0 07/12/2023   HDL 76.60 07/12/2023   LDLDIRECT 118.0 10/06/2022   LDLCALC 59 07/12/2023   ALT 70 (H) 11/07/2023   AST 120 (H) 11/07/2023   NA 131 (L) 11/07/2023   K 3.4 (L) 11/07/2023   CL 94 (L) 11/07/2023   CREATININE 0.95 11/07/2023   BUN 16 11/07/2023   CO2 24 11/07/2023   TSH 4.88 07/12/2023   HGBA1C 5.7 07/12/2023       Assessment & Plan:  Atrial fibrillation, unspecified type [facility]) Assessment & Plan: Continues on eliquis .  Has propranolol  to take prn. Followed by Dr Gollan.   Orders: -     CBC with Differential/Platelet  Hypercholesterolemia Assessment & Plan: On crestor .  Follow lipid panel and liver function tests.   Orders: -  CBC with Differential/Platelet -     Basic metabolic panel -     Hepatic function panel  Dysuria -     Urinalysis, Routine w reflex microscopic -     Urine Culture  Weakness Assessment & Plan: Seen in ER as outlined.  Given IVFs. Eating better. Energy gradually improving. Follow. Recheck cbc and metabolic panel today.    Thrombophilia (HCC) Assessment & Plan: Takes eliquis . Continue.    Stress Assessment & Plan: Continue zoloft .  Stable.    Hyponatremia Assessment & Plan: Sodium low in ER. Given IVFs. Eating better.  Recheck metabolic panel today.    Hemochromatosis, hereditary (HCC) Assessment & Plan: Continue to follow iron studies annually.  Saw hematology.     Essential hypertension Assessment & Plan: Continue amlodipine  and losartan .  Blood pressure as outlined.  Follow pressures.  Follow metabolic panel. Back taking her medications regularly.    Cough, unspecified type Assessment & Plan: Saw Dr Viva Grise. Diagnosed with mild to moderate asthma.  Has rescue inhaler if needed.  Cough overall is better.    Malignant neoplasm of female breast, unspecified estrogen receptor status, unspecified laterality, unspecified site of breast [facility]) Assessment & Plan: Had f/u with Dr Charmel Cooter 05/17/23 - f/u breast cancer surveillance. F/u mammogram 05/10/23 - ok. Recommended f/u in one year.   Aortic atherosclerosis (HCC) Assessment & Plan: Continue crestor .     Abnormal liver function tests Assessment & Plan: Recheck liver panel today. Elevated in ER.  Feeling better. No abdominal pain.    External nasal lesion Assessment & Plan: Bactroban  as directed. Follow.    Other orders -     Mupirocin ; Apply 1 Application topically 2 (two) times daily.  Dispense: 22 g; Refill: 0     Dellar Fenton, MD

## 2023-11-14 NOTE — Assessment & Plan Note (Signed)
Continue to follow iron studies annually.  Saw hematology.   ?

## 2023-11-14 NOTE — Assessment & Plan Note (Signed)
 Continue amlodipine  and losartan .  Blood pressure as outlined.  Follow pressures.  Follow metabolic panel. Back taking her medications regularly.

## 2023-11-14 NOTE — Assessment & Plan Note (Signed)
 Saw Dr Viva Grise. Diagnosed with mild to moderate asthma.  Has rescue inhaler if needed.  Cough overall is better.

## 2023-11-14 NOTE — Assessment & Plan Note (Signed)
 Recheck liver panel today. Elevated in ER.  Feeling better. No abdominal pain.

## 2023-11-14 NOTE — Assessment & Plan Note (Signed)
Had f/u with Dr Maia Plan 05/17/23 - f/u breast cancer surveillance. F/u mammogram 05/10/23 - ok. Recommended f/u in one year.

## 2023-11-14 NOTE — Assessment & Plan Note (Signed)
 On crestor.  Follow lipid panel and liver function tests.

## 2023-11-14 NOTE — Assessment & Plan Note (Signed)
 Continue zoloft.  Stable.

## 2023-11-14 NOTE — Assessment & Plan Note (Addendum)
 Seen in ER as outlined.  Given IVFs. Eating better. Energy gradually improving. Follow. Recheck cbc and metabolic panel today.

## 2023-11-14 NOTE — Assessment & Plan Note (Signed)
Bactroban as directed.  Follow.  

## 2023-11-14 NOTE — Assessment & Plan Note (Signed)
 Continues on eliquis.  Has propranolol to take prn. Followed by Dr Mariah Milling.

## 2023-11-15 ENCOUNTER — Telehealth: Payer: Self-pay

## 2023-11-15 DIAGNOSIS — R710 Precipitous drop in hematocrit: Secondary | ICD-10-CM

## 2023-11-15 DIAGNOSIS — E876 Hypokalemia: Secondary | ICD-10-CM

## 2023-11-15 LAB — URINE CULTURE
MICRO NUMBER:: 16063498
Result:: NO GROWTH
SPECIMEN QUALITY:: ADEQUATE

## 2023-11-15 LAB — URINALYSIS, ROUTINE W REFLEX MICROSCOPIC
Bilirubin Urine: NEGATIVE
Hgb urine dipstick: NEGATIVE
Ketones, ur: NEGATIVE
Leukocytes,Ua: NEGATIVE
Nitrite: NEGATIVE
Specific Gravity, Urine: 1.01 (ref 1.000–1.030)
Total Protein, Urine: NEGATIVE
Urine Glucose: NEGATIVE
Urobilinogen, UA: 0.2 (ref 0.0–1.0)
pH: 6.5 (ref 5.0–8.0)

## 2023-11-15 NOTE — Telephone Encounter (Signed)
Labs ordered per lab result note.

## 2023-11-16 ENCOUNTER — Other Ambulatory Visit: Payer: Medicare Other

## 2023-11-16 DIAGNOSIS — E876 Hypokalemia: Secondary | ICD-10-CM

## 2023-11-16 DIAGNOSIS — R710 Precipitous drop in hematocrit: Secondary | ICD-10-CM

## 2023-11-16 DIAGNOSIS — R739 Hyperglycemia, unspecified: Secondary | ICD-10-CM | POA: Diagnosis not present

## 2023-11-16 DIAGNOSIS — E7800 Pure hypercholesterolemia, unspecified: Secondary | ICD-10-CM

## 2023-11-16 DIAGNOSIS — I10 Essential (primary) hypertension: Secondary | ICD-10-CM

## 2023-11-16 LAB — VITAMIN B12: Vitamin B-12: 1227 pg/mL — ABNORMAL HIGH (ref 211–911)

## 2023-11-16 LAB — CBC WITH DIFFERENTIAL/PLATELET
Basophils Absolute: 0.1 10*3/uL (ref 0.0–0.1)
Basophils Relative: 1.3 % (ref 0.0–3.0)
Eosinophils Absolute: 0.1 10*3/uL (ref 0.0–0.7)
Eosinophils Relative: 2.2 % (ref 0.0–5.0)
HCT: 32.7 % — ABNORMAL LOW (ref 36.0–46.0)
Hemoglobin: 11 g/dL — ABNORMAL LOW (ref 12.0–15.0)
Lymphocytes Relative: 16.5 % (ref 12.0–46.0)
Lymphs Abs: 0.9 10*3/uL (ref 0.7–4.0)
MCHC: 33.5 g/dL (ref 30.0–36.0)
MCV: 96.6 fL (ref 78.0–100.0)
Monocytes Absolute: 0.5 10*3/uL (ref 0.1–1.0)
Monocytes Relative: 8.8 % (ref 3.0–12.0)
Neutro Abs: 3.7 10*3/uL (ref 1.4–7.7)
Neutrophils Relative %: 71.2 % (ref 43.0–77.0)
Platelets: 474 10*3/uL — ABNORMAL HIGH (ref 150.0–400.0)
RBC: 3.39 Mil/uL — ABNORMAL LOW (ref 3.87–5.11)
RDW: 13.9 % (ref 11.5–15.5)
WBC: 5.1 10*3/uL (ref 4.0–10.5)

## 2023-11-16 LAB — HEPATIC FUNCTION PANEL
ALT: 25 U/L (ref 0–35)
AST: 24 U/L (ref 0–37)
Albumin: 3.7 g/dL (ref 3.5–5.2)
Alkaline Phosphatase: 62 U/L (ref 39–117)
Bilirubin, Direct: 0.2 mg/dL (ref 0.0–0.3)
Total Bilirubin: 0.5 mg/dL (ref 0.2–1.2)
Total Protein: 6.8 g/dL (ref 6.0–8.3)

## 2023-11-16 LAB — IBC + FERRITIN
Ferritin: 206.1 ng/mL (ref 10.0–291.0)
Iron: 89 ug/dL (ref 42–145)
Saturation Ratios: 28.6 % (ref 20.0–50.0)
TIBC: 310.8 ug/dL (ref 250.0–450.0)
Transferrin: 222 mg/dL (ref 212.0–360.0)

## 2023-11-16 LAB — BASIC METABOLIC PANEL
BUN: 15 mg/dL (ref 6–23)
CO2: 32 meq/L (ref 19–32)
Calcium: 9.3 mg/dL (ref 8.4–10.5)
Chloride: 100 meq/L (ref 96–112)
Creatinine, Ser: 0.88 mg/dL (ref 0.40–1.20)
GFR: 58.98 mL/min — ABNORMAL LOW (ref >60.00)
Glucose, Bld: 102 mg/dL — ABNORMAL HIGH (ref 70–99)
Potassium: 5 meq/L (ref 3.5–5.1)
Sodium: 139 meq/L (ref 135–145)

## 2023-11-16 LAB — LIPID PANEL
Cholesterol: 112 mg/dL (ref 0–200)
HDL: 40.5 mg/dL (ref >39.00)
LDL Cholesterol: 42 mg/dL (ref 0–99)
NonHDL: 71
Total CHOL/HDL Ratio: 3
Triglycerides: 143 mg/dL (ref 0.0–149.0)
VLDL: 28.6 mg/dL (ref 0.0–40.0)

## 2023-11-16 LAB — POTASSIUM: Potassium: 5 meq/L (ref 3.5–5.1)

## 2023-11-16 LAB — HEMOGLOBIN A1C: Hgb A1c MFr Bld: 6.2 % (ref 4.6–6.5)

## 2023-12-21 ENCOUNTER — Telehealth: Payer: Self-pay | Admitting: Internal Medicine

## 2023-12-21 NOTE — Telephone Encounter (Signed)
 Please call the office at [phone] to reschedule your appointment on 12/29/2023, Dr Lorin Picket is off that day. E2C2 please reschedule appointment to next available.

## 2023-12-29 ENCOUNTER — Ambulatory Visit: Payer: Medicare Other | Admitting: Internal Medicine

## 2024-01-03 ENCOUNTER — Encounter: Payer: Self-pay | Admitting: Internal Medicine

## 2024-01-03 ENCOUNTER — Ambulatory Visit: Admitting: Internal Medicine

## 2024-01-03 VITALS — BP 118/78 | HR 52 | Temp 98.7°F | Ht 66.0 in | Wt 125.8 lb

## 2024-01-03 DIAGNOSIS — I4891 Unspecified atrial fibrillation: Secondary | ICD-10-CM

## 2024-01-03 DIAGNOSIS — R710 Precipitous drop in hematocrit: Secondary | ICD-10-CM

## 2024-01-03 DIAGNOSIS — D6859 Other primary thrombophilia: Secondary | ICD-10-CM

## 2024-01-03 DIAGNOSIS — R739 Hyperglycemia, unspecified: Secondary | ICD-10-CM

## 2024-01-03 DIAGNOSIS — E7800 Pure hypercholesterolemia, unspecified: Secondary | ICD-10-CM

## 2024-01-03 DIAGNOSIS — I10 Essential (primary) hypertension: Secondary | ICD-10-CM | POA: Diagnosis not present

## 2024-01-03 DIAGNOSIS — R32 Unspecified urinary incontinence: Secondary | ICD-10-CM

## 2024-01-03 DIAGNOSIS — J4520 Mild intermittent asthma, uncomplicated: Secondary | ICD-10-CM

## 2024-01-03 DIAGNOSIS — I483 Typical atrial flutter: Secondary | ICD-10-CM

## 2024-01-03 DIAGNOSIS — C50919 Malignant neoplasm of unspecified site of unspecified female breast: Secondary | ICD-10-CM

## 2024-01-03 DIAGNOSIS — I700 Atherosclerosis of aorta: Secondary | ICD-10-CM

## 2024-01-03 DIAGNOSIS — F439 Reaction to severe stress, unspecified: Secondary | ICD-10-CM

## 2024-01-03 NOTE — Progress Notes (Signed)
 Subjective:    Patient ID: Anita Benjamin, female    DOB: [birth year], 89 y.o.   MRN: [id number]  Patient here for  Chief Complaint  Patient presents with   Medical Management of Chronic Issues    HPI Here for a scheduled follow up. Had f/u with Dr Maia Plan 05/17/23 - f/u breast cancer surveillance. F/u mammogram 05/10/23 - ok. Recommended f/u in one year. Continues on letrozole. Had f/u with Dr Mariah Milling 05/03/23 - f/u atrial flutter.  Continues on eliquis.  Has propranolol to take prn.  Per review, had previously noted thyroid nodules on carotid ultrasound.  F/u thyroid ultrasound revealed - mild thyromegaly with bilateral cysts and complex cysts.  Per report, none met criteria for biopsy or follow up. Had f/u with Dr Jayme Cloud 07/20/23 - f/u cough variant asthma. On advair. Feels breathing is stable. No increased cough or congestion. Does report noticing some leakage of urine - notices some urination when having a bowel movement. Discussed kegel exercises and PFPT.    Past Medical History:  Diagnosis Date   Anemia    Atrial fibrillation (HCC)    Atrial flutter (HCC)    Fibrocystic breast disease    GERD (gastroesophageal reflux disease)    History of chicken pox    Hypercholesterolemia    Hypertension    Past Surgical History:  Procedure Laterality Date   BREAST BIOPSY Right 2014   NEG   BREAST BIOPSY Right 05/24/2022   stereo bx asymmetry, x marker, benign   BREAST BIOPSY Right 05/25/2022   [place] Core BX, Coil Clip - benign   BREAST LUMPECTOMY     BREAST LUMPECTOMY WITH SENTINEL LYMPH NODE BIOPSY Left 06/04/2020   Procedure: BREAST LUMPECTOMY WITH SENTINEL LYMPH NODE BX;  Surgeon: Earline Mayotte, MD;  Location: [facility] ORS;  Service: General;  Laterality: Left;   CATARACT EXTRACTION     DILATION AND CURETTAGE OF UTERUS     Laser repair of torn retina     Family History  Problem Relation Age of Onset   Heart disease Mother 69       Heart attack   Cancer Father 27       lung cancer    Breast cancer Paternal Aunt        x3   Colon cancer Neg Hx    Social History   Socioeconomic History   Marital status: Widowed    Spouse name: Not on file   Number of children: 2   Years of education: Not on file   Highest education level: Not on file  Occupational History   Not on file  Tobacco Use   Smoking status: Never   Smokeless tobacco: Never  Vaping Use   Vaping status: Never Used  Substance and Sexual Activity   Alcohol use: No    Alcohol/week: 0.0 standard drinks of alcohol   Drug use: No   Sexual activity: Not on file  Other Topics Concern   Not on file  Social History Narrative   Not on file   Social Drivers of Health   Financial Resource Strain: Low Risk  (02/13/2018)   Overall Financial Resource Strain (CARDIA)    Difficulty of Paying Living Expenses: Not hard at all  Food Insecurity: No Food Insecurity (02/13/2018)   Hunger Vital Sign    Worried About Running Out of Food in the Last Year: Never true    Ran Out of Food in the Last Year: Never true  Transportation Needs: No Transportation Needs (  02/13/2018)   PRAPARE - Administrator, Civil Service (Medical): No    Lack of Transportation (Non-Medical): No  Physical Activity: Unknown (02/13/2018)   Exercise Vital Sign    Days of Exercise per Week: 0 days    Minutes of Exercise per Session: Not on file  Stress: No Stress Concern Present (02/13/2018)   [employer] of Occupational Health - Occupational Stress Questionnaire    Feeling of Stress : Not at all  Social Connections: Not on file     Review of Systems  Constitutional:  Negative for appetite change and unexpected weight change.  HENT:  Negative for congestion and sinus pressure.   Respiratory:  Negative for cough, chest tightness and shortness of breath.   Cardiovascular:  Negative for chest pain, palpitations and leg swelling.  Gastrointestinal:  Negative for abdominal pain, diarrhea, nausea and vomiting.       Benefiber for  bowels.   Genitourinary:  Negative for dysuria.       Noticed some leakage as outlined.   Musculoskeletal:  Negative for joint swelling and myalgias.  Skin:  Negative for color change and rash.  Neurological:  Negative for dizziness and headaches.  Psychiatric/Behavioral:  Negative for agitation and dysphoric mood.        Objective:     BP 118/78   Pulse (!) 52   Temp 98.7 F (37.1 C)   Ht 5\' 6"  (1.676 m)   Wt 125 lb 12.8 oz (57.1 kg)   LMP 10/27/1989   SpO2 97%   BMI 20.30 kg/m  Wt Readings from Last 3 Encounters:  01/03/24 125 lb 12.8 oz (57.1 kg)  11/14/23 131 lb 3.2 oz (59.5 kg)  11/07/23 126 lb (57.2 kg)    Physical Exam Vitals reviewed.  Constitutional:      General: She is not in acute distress.    Appearance: Normal appearance.  HENT:     Head: Normocephalic and atraumatic.     Right Ear: External ear normal.     Left Ear: External ear normal.     Mouth/Throat:     Pharynx: No oropharyngeal exudate or posterior oropharyngeal erythema.  Eyes:     General: No scleral icterus.       Right eye: No discharge.        Left eye: No discharge.     Conjunctiva/sclera: Conjunctivae normal.  Neck:     Thyroid: No thyromegaly.  Cardiovascular:     Rate and Rhythm: Normal rate and regular rhythm.  Pulmonary:     Effort: No respiratory distress.     Breath sounds: Normal breath sounds. No wheezing.  Abdominal:     General: Bowel sounds are normal.     Palpations: Abdomen is soft.     Tenderness: There is no abdominal tenderness.  Musculoskeletal:        General: No swelling or tenderness.     Cervical back: Neck supple. No tenderness.  Lymphadenopathy:     Cervical: No cervical adenopathy.  Skin:    Findings: No erythema or rash.  Neurological:     Mental Status: She is alert.  Psychiatric:        Mood and Affect: Mood normal.        Behavior: Behavior normal.         Outpatient Encounter Medications as of 01/03/2024  Medication Sig   acetaminophen  (TYLENOL) 650 MG CR tablet Take 650 mg by mouth every 8 (eight) hours as needed for pain.  Albuterol Sulfate (PROAIR RESPICLICK) 108 (90 Base) MCG/ACT AEPB Inhale 2 puffs into the lungs every 6 (six) hours as needed.   amLODipine (NORVASC) 5 MG tablet TAKE 1 TABLET BY MOUTH ONCE DAILY AT DINNER TIME   apixaban (ELIQUIS) 2.5 MG TABS tablet Take 1 tablet (2.5 mg total) by mouth 2 (two) times daily.   Ascorbic Acid (VITAMIN C) 250 MG CHEW Chew 250 mg by mouth daily.    calcium citrate-vitamin D 500-500 MG-UNIT chewable tablet Chew 3 tablets by mouth daily.   Cranberry-Vitamin C-Probiotic (AZO CRANBERRY PO) Take 1 tablet by mouth daily.   fluticasone-salmeterol (ADVAIR) 100-50 MCG/ACT AEPB Inhale 1 puff into the lungs 2 (two) times daily.   letrozole (FEMARA) 2.5 MG tablet Take 2.5 mg by mouth daily.   losartan (COZAAR) 100 MG tablet TAKE 1 TABLET BY MOUTH ONCE DAILY   Multiple Vitamin (MULTIVITAMIN) tablet Take 1 tablet by mouth daily.   mupirocin ointment (BACTROBAN) 2 % Apply 1 Application topically 2 (two) times daily.   ondansetron (ZOFRAN-ODT) 4 MG disintegrating tablet Take 1 tablet (4 mg total) by mouth every 8 (eight) hours as needed for nausea or vomiting.   Probiotic Product (PROBIOTIC ADVANCED PO) Take 1 capsule by mouth daily.   propranolol (INDERAL) 10 MG tablet Take 1 tablet (10 mg total) by mouth 3 (three) times daily as needed.   rosuvastatin (CRESTOR) 5 MG tablet Take 1 tablet (5 mg total) by mouth every evening. TAKE 1 TABLET BY MOUTH ONCE EVERY EVENING   sertraline (ZOLOFT) 50 MG tablet Take 1 tablet (50 mg total) by mouth daily. Take 1 tablet q am   No facility-administered encounter medications on file as of 01/03/2024.     Lab Results  Component Value Date   WBC 5.1 11/16/2023   HGB 11.0 (L) 11/16/2023   HCT 32.7 (L) 11/16/2023   PLT 474.0 (H) 11/16/2023   GLUCOSE 102 (H) 11/16/2023   CHOL 112 11/16/2023   TRIG 143.0 11/16/2023   HDL 40.50 11/16/2023   LDLDIRECT 118.0  10/06/2022   LDLCALC 42 11/16/2023   ALT 25 11/16/2023   AST 24 11/16/2023   NA 139 11/16/2023   K 5.0 11/16/2023   K 5.0 11/16/2023   CL 100 11/16/2023   CREATININE 0.88 11/16/2023   BUN 15 11/16/2023   CO2 32 11/16/2023   TSH 4.88 07/12/2023   HGBA1C 6.2 11/16/2023    No results found.     Assessment & Plan:  Thrombophilia (HCC) Assessment & Plan: On eliquis. [place]ow.    Essential hypertension Assessment & Plan: Continue amlodipine and losartan.  Blood pressure as outlined.  Follow pressures.  Follow metabolic panel. No changes today.   Orders: -     Basic metabolic panel with GFR; Future  Hyperglycemia Assessment & Plan: Follow met b and A1c.   Orders: -     Hemoglobin A1c; Future  Hypercholesterolemia Assessment & Plan: On crestor. Follow lipid panel and liver function tests.   Orders: -     Hepatic function panel; Future -     Lipid panel; Future  Decreased hemoglobin -     CBC with Differential/Platelet; Future  Stress Assessment & Plan: Continue zoloft. Stable. Doing well.    Hemochromatosis, hereditary (HCC) Assessment & Plan: Continue to follow iron studies annually.  Saw hematology.     Malignant neoplasm of female breast, unspecified estrogen receptor status, unspecified laterality, unspecified site of breast [facility]) Assessment & Plan: Had f/u with Dr Maia Plan 05/17/23 - f/u breast  cancer surveillance. F/u mammogram 05/10/23 - ok. Recommended f/u in one year.   Typical atrial flutter (HCC) Assessment & Plan: Continue Eliquis.  Has been instructed to take propranolol..  Stable.   Atrial fibrillation, unspecified type [facility]) Assessment & Plan: Continues on eliquis.  Has propranolol to take prn. Followed by Dr Mariah Milling.    Mild intermittent asthma without complication Assessment & Plan: Had f/u with Dr Jayme Cloud 07/2023 - cough variant asthma. Advair bid. Albuterol prn. Breathing stable.    Aortic atherosclerosis (HCC) Assessment &  Plan: Continue crestor.    Urinary incontinence, unspecified type Assessment & Plan: Discussed kegel exercises. Discussed PFPT.       Dale Rolette, MD

## 2024-01-08 ENCOUNTER — Encounter: Payer: Self-pay | Admitting: Internal Medicine

## 2024-01-08 DIAGNOSIS — R32 Unspecified urinary incontinence: Secondary | ICD-10-CM | POA: Insufficient documentation

## 2024-01-08 NOTE — Assessment & Plan Note (Signed)
 On crestor.  Follow lipid panel and liver function tests.

## 2024-01-08 NOTE — Assessment & Plan Note (Signed)
Continue Eliquis.  Has been instructed to take propranolol..  Stable.

## 2024-01-08 NOTE — Assessment & Plan Note (Signed)
Continue to follow iron studies annually.  Saw hematology.   ?

## 2024-01-08 NOTE — Assessment & Plan Note (Signed)
 Continue crestor

## 2024-01-08 NOTE — Assessment & Plan Note (Signed)
Had f/u with Dr Maia Plan 05/17/23 - f/u breast cancer surveillance. F/u mammogram 05/10/23 - ok. Recommended f/u in one year.

## 2024-01-08 NOTE — Assessment & Plan Note (Signed)
 Follow met b and A1c.

## 2024-01-08 NOTE — Assessment & Plan Note (Signed)
 Discussed kegel exercises. Discussed PFPT.

## 2024-01-08 NOTE — Assessment & Plan Note (Signed)
 Continue amlodipine and losartan.  Blood pressure as outlined.  Follow pressures.  Follow metabolic panel. No changes today.

## 2024-01-08 NOTE — Assessment & Plan Note (Signed)
 Continue zoloft. Stable. Doing well.

## 2024-01-08 NOTE — Assessment & Plan Note (Signed)
 Continues on eliquis.  Has propranolol to take prn. Followed by Dr Mariah Milling.

## 2024-01-08 NOTE — Assessment & Plan Note (Signed)
 On eliquis. Folllow.

## 2024-01-08 NOTE — Assessment & Plan Note (Signed)
 Had f/u with Dr Jayme Cloud 07/2023 - cough variant asthma. Advair bid. Albuterol prn. Breathing stable.

## 2024-01-18 ENCOUNTER — Ambulatory Visit: Payer: Medicare Other | Admitting: Pulmonary Disease

## 2024-01-18 ENCOUNTER — Encounter: Payer: Self-pay | Admitting: Pulmonary Disease

## 2024-01-18 VITALS — BP 128/60 | HR 55 | Temp 97.6°F | Ht 66.0 in | Wt 127.4 lb

## 2024-01-18 DIAGNOSIS — J4540 Moderate persistent asthma, uncomplicated: Secondary | ICD-10-CM

## 2024-01-18 LAB — NITRIC OXIDE: Nitric Oxide: 14

## 2024-01-18 MED ORDER — PROAIR RESPICLICK 108 (90 BASE) MCG/ACT IN AEPB: 2.0000 | INHALATION_SPRAY | Freq: Four times a day (QID) | RESPIRATORY_TRACT | 3 refills | Status: AC | PRN

## 2024-01-18 NOTE — Patient Instructions (Signed)
 VISIT SUMMARY:  Today, we discussed your recent asthma exacerbation, which was likely triggered by pollen and windy conditions. You reported significant coughing but have not needed to use your rescue inhaler frequently. We reviewed your current asthma management plan and made some adjustments to ensure you have the necessary medications on hand.  YOUR PLAN:  -ASTHMA: Asthma is a condition where your airways narrow and swell, producing extra mucus, which can make breathing difficult. Your recent symptoms were likely triggered by pollen and windy conditions. Continue using Advair as your maintenance therapy. We have prescribed a new albuterol inhaler for rescue use. We will also check your airway inflammation levels to monitor your condition.  INSTRUCTIONS:  Please schedule a follow-up appointment in six months to review your asthma management and check your airway inflammation levels.

## 2024-01-18 NOTE — Progress Notes (Signed)
 Subjective:    Patient ID: Anita Benjamin, female    DOB: 20-May-1934, 89 y.o.   MRN: [id number]  Patient Care Team: Dale Sugartown, MD as PCP - General (Internal Medicine) Antonieta Iba, MD as PCP - Cardiology (Cardiology) Lemar Livings Merrily Pew, MD as Consulting Physician (General Surgery) Antonieta Iba, MD as Consulting Physician (Cardiology) Carmina Miller, MD as Referring Physician (Radiation Oncology)  Chief Complaint  Patient presents with   Follow-up    Occasional cough. No shortness of breath or wheezing.     BACKGROUND/INTERVAL: Patient is an 89 year old lifelong never smoker (albeit with significant secondhand smoke exposure) and a history as noted below, who presents for follow-up on the issue of cough and shortness of breath.  She has been diagnosed with cough variant asthma.  She was last seen here on 16 Tober 2024.  At that time patient was started on Advair 100/50,1 inhalation twice a day and albuterol as needed.   HPI Discussed the use of AI scribe software for clinical note transcription with the patient, who gave verbal consent to proceed.  History of Present Illness   Anita Benjamin is an 89 year old female with asthma who presents with a recent exacerbation.  She experienced a sudden onset of asthma symptoms yesterday after being outside, characterized by significant coughing. She attributes this episode to windy conditions and pollen exposure.  This resolved on its own and she feels like she is at baseline today.  She has not had any fevers, chills or sweats.  No sputum production, no hemoptysis.  She has not needed to use her rescue inhaler frequently, but it is expired. She continues to use Advair 100/50, 1 inhalation daily as part of her asthma management.  This keeps her cough usually very well-controlled with the exception of the exposures she had yesterday.  Overall she feels well and looks well.      DATA 11/20/2021 PFTs: FEV1 1.6 L or 64% predicted, FVC  2.26 L or 85% of predicted, FEV1/FVC 56%, there is significant bronchodilator response with a net change of 14% and FEV1.  Lung volumes were reduced however the test was not reproducible for lung volumes or diffusion capacity.  On the effort independent portion of the curve small airways component is noted.  Diffusion capacity mildly reduced not corrected for hemoglobin.  Overall patient has changes consistent with asthma.  Review of Systems A 10 point review of systems was performed and it is as noted above otherwise negative.   Patient Active Problem List   Diagnosis Date Noted   Urinary leakage 01/08/2024   External nasal lesion 11/14/2023   Weakness 11/07/2023   Left knee pain 09/06/2023   Fall 07/17/2023   Thyroid cyst 07/17/2023   Cold intolerance 03/02/2023   Internal nasal lesion 12/12/2022   Congestion of throat 12/12/2022   Acute cystitis without hematuria 10/26/2022   Asthma 04/12/2022   Hyperglycemia 02/20/2022   Sinusitis, acute, maxillary 01/04/2022   Hypercalcemia 01/04/2022   Right leg swelling 12/16/2021   Foot pain, right 12/15/2021   Right knee pain 12/15/2021   Thrombophilia (HCC) 09/12/2021   Decreased GFR 09/11/2021   Hemochromatosis, hereditary (HCC) 04/15/2021   Hemochromatosis carrier 04/05/2021   Aortic atherosclerosis (HCC) 04/05/2021   Headache 04/03/2021   Atrial fibrillation (HCC) 01/15/2021   Anemia 01/12/2021   Atrial fibrillation with RVR (HCC) 01/01/2021   Hyponatremia 01/01/2021   CAP (community acquired pneumonia) 12/25/2020   Breast cancer (HCC) 06/09/2020   Diarrhea 04/11/2019  Hair loss 06/10/2017   Fatigue 02/19/2017   Chest pain 02/12/2017   Encounter for anticoagulation discussion and counseling 10/05/2016   Cough 10/01/2016   Gas 10/27/2015   Dysuria 09/17/2015   Left hip pain 07/21/2015   Neck pain 06/09/2015   Syncope 03/25/2015   Left wrist pain 03/25/2015   Abnormal liver function tests 12/05/2014   SOB (shortness of  breath) 12/01/2014   Abdominal discomfort 12/01/2014   Atrial flutter (HCC) 11/15/2014   Stress 11/08/2014   Difficulty sleeping 11/08/2014   Health care maintenance 11/08/2014   Adjustment disorder 10/14/2014   Tachycardia 07/17/2014   Hyperkalemia 07/17/2014   History of colonic polyps 03/09/2014   Decreased sense of taste 03/09/2014   Abnormal mammogram 07/31/2013   Abnormal mammogram 01/20/2013   Essential hypertension 10/29/2012   Hypercholesterolemia 10/29/2012   Leukopenia 10/29/2012    Social History   Tobacco Use   Smoking status: Never   Smokeless tobacco: Never  Substance Use Topics   Alcohol use: No    Alcohol/week: 0.0 standard drinks of alcohol    Allergies  Allergen Reactions   Oruvail [Ketoprofen]    Relafen [Nabumetone]    Timentin [Ticarcillin-Pot Clavulanate]     Current Meds  Medication Sig   acetaminophen (TYLENOL) 650 MG CR tablet Take 650 mg by mouth every 8 (eight) hours as needed for pain.   amLODipine (NORVASC) 5 MG tablet TAKE 1 TABLET BY MOUTH ONCE DAILY AT DINNER TIME   apixaban (ELIQUIS) 2.5 MG TABS tablet Take 1 tablet (2.5 mg total) by mouth 2 (two) times daily.   Ascorbic Acid (VITAMIN C) 250 MG CHEW Chew 250 mg by mouth daily.    calcium citrate-vitamin D 500-500 MG-UNIT chewable tablet Chew 3 tablets by mouth daily.   Cranberry-Vitamin C-Probiotic (AZO CRANBERRY PO) Take 1 tablet by mouth daily.   fluticasone-salmeterol (ADVAIR) 100-50 MCG/ACT AEPB Inhale 1 puff into the lungs 2 (two) times daily.   letrozole (FEMARA) 2.5 MG tablet Take 2.5 mg by mouth daily.   losartan (COZAAR) 100 MG tablet TAKE 1 TABLET BY MOUTH ONCE DAILY   Multiple Vitamin (MULTIVITAMIN) tablet Take 1 tablet by mouth daily.   mupirocin ointment (BACTROBAN) 2 % Apply 1 Application topically 2 (two) times daily.   ondansetron (ZOFRAN-ODT) 4 MG disintegrating tablet Take 1 tablet (4 mg total) by mouth every 8 (eight) hours as needed for nausea or vomiting.    Probiotic Product (PROBIOTIC ADVANCED PO) Take 1 capsule by mouth daily.   propranolol (INDERAL) 10 MG tablet Take 1 tablet (10 mg total) by mouth 3 (three) times daily as needed.   rosuvastatin (CRESTOR) 5 MG tablet Take 1 tablet (5 mg total) by mouth every evening. TAKE 1 TABLET BY MOUTH ONCE EVERY EVENING   sertraline (ZOLOFT) 50 MG tablet Take 1 tablet (50 mg total) by mouth daily. Take 1 tablet q am   [DISCONTINUED] Albuterol Sulfate (PROAIR RESPICLICK) 108 (90 Base) MCG/ACT AEPB Inhale 2 puffs into the lungs every 6 (six) hours as needed.    Immunization History  Administered Date(s) Administered   Fluad Quad(high Dose 65+) 06/22/2019, 07/30/2020, 06/12/2021, 07/13/2022   Fluad Trivalent(High Dose 65+) 07/12/2023   Influenza, High Dose Seasonal PF 06/10/2017, 09/14/2018   Influenza,inj,Quad PF,6+ Mos 06/27/2013, 07/04/2014, 06/05/2015, 05/27/2016   PFIZER(Purple Top)SARS-COV-2 Vaccination 10/31/2019, 11/21/2019   Pneumococcal Conjugate-13 10/21/2015   Pneumococcal Polysaccharide-23 10/06/2017   Tdap 10/27/2012        Objective:     BP 128/60 (BP Location: Right Arm,  Patient Position: Sitting, Cuff Size: Normal)   Pulse (!) 55   Temp 97.6 F (36.4 C) (Temporal)   Ht 5\' 6"  (1.676 m)   Wt 127 lb 6.4 oz (57.8 kg)   LMP 10/27/1989   SpO2 97%   BMI 20.56 kg/m   SpO2: 97 %  GENERAL: Thin woman, no acute distress, fully ambulatory.  No conversational dyspnea. HEAD: Normocephalic, atraumatic.  EYES: Pupils equal, round, reactive to light.  No scleral icterus.  MOUTH: Dentition intact, multiple fillings.  Oral mucosa moist.  No thrush. NECK: Supple. No thyromegaly. Trachea midline. No JVD.  No adenopathy. PULMONARY: Good air entry bilaterally.  No adventitious sounds. CARDIOVASCULAR: S1 and S2. Regular rate and rhythm.  No rubs, murmurs or gallops heard. ABDOMEN: Benign. MUSCULOSKELETAL: Osteoarthritis changes both hands, no clubbing, no edema.  NEUROLOGIC: No focal deficit,  no gait disturbance, speech is fluent. SKIN: Intact,warm,dry. PSYCH: Mood and behavior normal.   Lab Results  Component Value Date   NITRICOXIDE 14 01/18/2024  *No evidence of type II inflammation (IL-13, eosinophilic inflammation)    Assessment & Plan:     ICD-10-CM   1. Moderate persistent asthma without complication  J45.40 Nitric oxide      Orders Placed This Encounter  Procedures   Nitric oxide    Meds ordered this encounter  Medications   Albuterol Sulfate (PROAIR RESPICLICK) 108 (90 Base) MCG/ACT AEPB    Sig: Inhale 2 puffs into the lungs every 6 (six) hours as needed.    Dispense:  1 each    Refill:  3   Discussion:    Moderate persistent asthma, cough variant type She experienced a mild exacerbation of asthma symptoms, characterized by coughing spells, likely triggered by pollen and windy conditions. She reports infrequent use of the rescue inhaler, which is expired. Her current maintenance therapy with Advair is effective overall in managing symptoms. - Continue Advair as maintenance therapy. - Prescribe albuterol inhaler for rescue use. - Check airway inflammation levels. - Schedule follow-up appointment in six months.      Advised if symptoms do not improve or worsen, to please contact office for sooner follow up or seek emergency care.    I spent 30 minutes of dedicated to the care of this patient on the date of this encounter to include pre-visit review of records, face-to-face time with the patient discussing conditions above, post visit ordering of testing, clinical documentation with the electronic health record, making appropriate referrals as documented, and communicating necessary findings to members of the patients care team.     C. Chloe Counter, MD Advanced Bronchoscopy PCCM [facility]    *This note was generated using voice recognition software/Dragon and/or AI transcription program.  Despite best efforts to proofread,  errors can occur which can change the meaning. Any transcriptional errors that result from this process are unintentional and may not be fully corrected at the time of dictation.

## 2024-01-24 ENCOUNTER — Other Ambulatory Visit: Payer: Self-pay | Admitting: Internal Medicine

## 2024-02-23 ENCOUNTER — Other Ambulatory Visit: Payer: Self-pay | Admitting: Internal Medicine

## 2024-03-02 ENCOUNTER — Other Ambulatory Visit (INDEPENDENT_AMBULATORY_CARE_PROVIDER_SITE_OTHER)

## 2024-03-02 ENCOUNTER — Ambulatory Visit: Payer: Self-pay | Admitting: Internal Medicine

## 2024-03-02 DIAGNOSIS — R739 Hyperglycemia, unspecified: Secondary | ICD-10-CM

## 2024-03-02 DIAGNOSIS — E7800 Pure hypercholesterolemia, unspecified: Secondary | ICD-10-CM

## 2024-03-02 DIAGNOSIS — I10 Essential (primary) hypertension: Secondary | ICD-10-CM

## 2024-03-02 DIAGNOSIS — R710 Precipitous drop in hematocrit: Secondary | ICD-10-CM | POA: Diagnosis not present

## 2024-03-02 LAB — BASIC METABOLIC PANEL WITH GFR
BUN: 23 mg/dL (ref 6–23)
CO2: 30 meq/L (ref 19–32)
Calcium: 9.9 mg/dL (ref 8.4–10.5)
Chloride: 104 meq/L (ref 96–112)
Creatinine, Ser: 1 mg/dL (ref 0.40–1.20)
GFR: 50.48 mL/min — ABNORMAL LOW (ref >60.00)
Glucose, Bld: 98 mg/dL (ref 70–99)
Potassium: 4.3 meq/L (ref 3.5–5.1)
Sodium: 142 meq/L (ref 135–145)

## 2024-03-02 LAB — HEPATIC FUNCTION PANEL
ALT: 15 U/L (ref 0–35)
AST: 23 U/L (ref 0–37)
Albumin: 4.3 g/dL (ref 3.5–5.2)
Alkaline Phosphatase: 59 U/L (ref 39–117)
Bilirubin, Direct: 0.1 mg/dL (ref 0.0–0.3)
Total Bilirubin: 0.5 mg/dL (ref 0.2–1.2)
Total Protein: 7.5 g/dL (ref 6.0–8.3)

## 2024-03-02 LAB — CBC WITH DIFFERENTIAL/PLATELET
Basophils Absolute: 0.1 10*3/uL (ref 0.0–0.1)
Basophils Relative: 1.1 % (ref 0.0–3.0)
Eosinophils Absolute: 0.3 10*3/uL (ref 0.0–0.7)
Eosinophils Relative: 6.4 % — ABNORMAL HIGH (ref 0.0–5.0)
HCT: 39.4 % (ref 36.0–46.0)
Hemoglobin: 13.2 g/dL (ref 12.0–15.0)
Lymphocytes Relative: 15.4 % (ref 12.0–46.0)
Lymphs Abs: 0.7 10*3/uL (ref 0.7–4.0)
MCHC: 33.6 g/dL (ref 30.0–36.0)
MCV: 94.2 fl (ref 78.0–100.0)
Monocytes Absolute: 0.5 10*3/uL (ref 0.1–1.0)
Monocytes Relative: 10.2 % (ref 3.0–12.0)
Neutro Abs: 3.2 10*3/uL (ref 1.4–7.7)
Neutrophils Relative %: 66.9 % (ref 43.0–77.0)
Platelets: 283 10*3/uL (ref 150.0–400.0)
RBC: 4.18 Mil/uL (ref 3.87–5.11)
RDW: 13.6 % (ref 11.5–15.5)
WBC: 4.8 10*3/uL (ref 4.0–10.5)

## 2024-03-02 LAB — HEMOGLOBIN A1C: Hgb A1c MFr Bld: 5.9 % (ref 4.6–6.5)

## 2024-03-02 LAB — LIPID PANEL
Cholesterol: 142 mg/dL (ref 0–200)
HDL: 67.8 mg/dL (ref >39.00)
LDL Cholesterol: 56 mg/dL (ref 0–99)
NonHDL: 74.26
Total CHOL/HDL Ratio: 2
Triglycerides: 90 mg/dL (ref 0.0–149.0)
VLDL: 18 mg/dL (ref 0.0–40.0)

## 2024-03-07 ENCOUNTER — Encounter: Payer: Self-pay | Admitting: Internal Medicine

## 2024-03-07 ENCOUNTER — Ambulatory Visit: Admitting: Internal Medicine

## 2024-03-07 VITALS — BP 120/70 | HR 60 | Temp 97.9°F | Resp 16 | Ht 66.0 in | Wt 126.6 lb

## 2024-03-07 DIAGNOSIS — R351 Nocturia: Secondary | ICD-10-CM

## 2024-03-07 DIAGNOSIS — I4891 Unspecified atrial fibrillation: Secondary | ICD-10-CM

## 2024-03-07 DIAGNOSIS — J4520 Mild intermittent asthma, uncomplicated: Secondary | ICD-10-CM

## 2024-03-07 DIAGNOSIS — E7800 Pure hypercholesterolemia, unspecified: Secondary | ICD-10-CM | POA: Diagnosis not present

## 2024-03-07 DIAGNOSIS — I10 Essential (primary) hypertension: Secondary | ICD-10-CM

## 2024-03-07 DIAGNOSIS — D6859 Other primary thrombophilia: Secondary | ICD-10-CM

## 2024-03-07 DIAGNOSIS — R739 Hyperglycemia, unspecified: Secondary | ICD-10-CM

## 2024-03-07 DIAGNOSIS — D649 Anemia, unspecified: Secondary | ICD-10-CM

## 2024-03-07 DIAGNOSIS — R944 Abnormal results of kidney function studies: Secondary | ICD-10-CM

## 2024-03-07 DIAGNOSIS — I700 Atherosclerosis of aorta: Secondary | ICD-10-CM

## 2024-03-07 DIAGNOSIS — E041 Nontoxic single thyroid nodule: Secondary | ICD-10-CM

## 2024-03-07 DIAGNOSIS — C50919 Malignant neoplasm of unspecified site of unspecified female breast: Secondary | ICD-10-CM

## 2024-03-07 NOTE — Assessment & Plan Note (Addendum)
 Recent A1c 5.9. improved. Follow met b and A1c.

## 2024-03-07 NOTE — Progress Notes (Signed)
 Subjective:    Patient ID: Anita Benjamin, female    DOB: [birth year], 89 y.o.   MRN: [id number]  Patient here for  Chief Complaint  Patient presents with   Medical Management of Chronic Issues    HPI Here for a scheduled follow up. Had f/u with Dr Charmel Cooter 05/17/23 - f/u breast cancer surveillance. F/u mammogram 05/10/23 - ok. Recommended f/u in one year. Continues on letrozole. Had f/u with Dr Gollan 05/03/23 - f/u atrial flutter. Continues on eliquis . Has propranolol  to take prn. Per review, had previously noted thyroid  nodules on carotid ultrasound. F/u thyroid  ultrasound revealed - mild thyromegaly with bilateral cysts and complex cysts. Per report, none met criteria for biopsy or follow up. Had f/u with Dr Viva Grise 01/18/24 - f/u regarding cough variant asthma. Recommended continuing advair. Albuterol  inhaler if needed. Using the inhaler regularly. No chest pain. Breathing stable. No increased cough or congestion. No abdominal pain or bowel change reported.    Past Medical History:  Diagnosis Date   Anemia    Atrial fibrillation (HCC)    Atrial flutter (HCC)    Fibrocystic breast disease    GERD (gastroesophageal reflux disease)    History of chicken pox    Hypercholesterolemia    Hypertension    Past Surgical History:  Procedure Laterality Date   BREAST BIOPSY Right 2014   NEG   BREAST BIOPSY Right 05/24/2022   stereo bx asymmetry, x marker, benign   BREAST BIOPSY Right 05/25/2022   US  Core BX, Coil Clip - benign   BREAST LUMPECTOMY     BREAST LUMPECTOMY WITH SENTINEL LYMPH NODE BIOPSY Left 06/04/2020   Procedure: BREAST LUMPECTOMY WITH SENTINEL LYMPH NODE BX;  Surgeon: Marshall Skeeter, MD;  Location: [facility] ORS;  Service: General;  Laterality: Left;   CATARACT EXTRACTION     DILATION AND CURETTAGE OF UTERUS     Laser repair of torn retina     Family History  Problem Relation Age of Onset   Heart disease Mother 63       Heart attack   Cancer Father 42       lung cancer    Breast cancer Paternal Aunt        x3   Colon cancer Neg Hx    Social History   Socioeconomic History   Marital status: Widowed    Spouse name: Not on file   Number of children: 2   Years of education: Not on file   Highest education level: Not on file  Occupational History   Not on file  Tobacco Use   Smoking status: Never   Smokeless tobacco: Never  Vaping Use   Vaping status: Never Used  Substance and Sexual Activity   Alcohol use: No    Alcohol/week: 0.0 standard drinks of alcohol   Drug use: No   Sexual activity: Not on file  Other Topics Concern   Not on file  Social History Narrative   Not on file   Social Drivers of Health   Financial Resource Strain: Low Risk  (02/13/2018)   Overall Financial Resource Strain (CARDIA)    Difficulty of Paying Living Expenses: Not hard at all  Food Insecurity: No Food Insecurity (02/13/2018)   Hunger Vital Sign    Worried About Running Out of Food in the Last Year: Never true    Ran Out of Food in the Last Year: Never true  Transportation Needs: No Transportation Needs (02/13/2018)   PRAPARE - Transportation  Lack of Transportation (Medical): No    Lack of Transportation (Non-Medical): No  Physical Activity: Unknown (02/13/2018)   Exercise Vital Sign    Days of Exercise per Week: 0 days    Minutes of Exercise per Session: Not on file  Stress: No Stress Concern Present (02/13/2018)   [employer] of Occupational Health - Occupational Stress Questionnaire    Feeling of Stress : Not at all  Social Connections: Not on file     Review of Systems  Constitutional:  Negative for appetite change and unexpected weight change.  HENT:  Negative for congestion and sinus pressure.   Respiratory:  Negative for cough, chest tightness and shortness of breath.   Cardiovascular:  Negative for chest pain, palpitations and leg swelling.  Gastrointestinal:  Negative for abdominal pain, diarrhea, nausea and vomiting.  Genitourinary:   Negative for difficulty urinating and dysuria.  Musculoskeletal:  Negative for joint swelling and myalgias.  Skin:  Negative for color change and rash.  Neurological:  Negative for dizziness and headaches.  Psychiatric/Behavioral:  Negative for agitation and dysphoric mood.        Objective:     BP 120/70   Pulse 60   Temp 97.9 F (36.6 C)   Resp 16   Ht 5\' 6"  (1.676 m)   Wt 126 lb 9.6 oz (57.4 kg)   LMP 10/27/1989   SpO2 97%   BMI 20.43 kg/m  Wt Readings from Last 3 Encounters:  03/07/24 126 lb 9.6 oz (57.4 kg)  01/18/24 127 lb 6.4 oz (57.8 kg)  01/03/24 125 lb 12.8 oz (57.1 kg)    Physical Exam Vitals reviewed.  Constitutional:      General: She is not in acute distress.    Appearance: Normal appearance.  HENT:     Head: Normocephalic and atraumatic.     Right Ear: External ear normal.     Left Ear: External ear normal.     Mouth/Throat:     Pharynx: No oropharyngeal exudate or posterior oropharyngeal erythema.  Eyes:     General: No scleral icterus.       Right eye: No discharge.        Left eye: No discharge.     Conjunctiva/sclera: Conjunctivae normal.  Neck:     Thyroid : No thyromegaly.  Cardiovascular:     Rate and Rhythm: Normal rate and regular rhythm.  Pulmonary:     Effort: No respiratory distress.     Breath sounds: Normal breath sounds. No wheezing.  Abdominal:     General: Bowel sounds are normal.     Palpations: Abdomen is soft.     Tenderness: There is no abdominal tenderness.  Musculoskeletal:        General: No swelling or tenderness.     Cervical back: Neck supple. No tenderness.  Lymphadenopathy:     Cervical: No cervical adenopathy.  Skin:    Findings: No erythema or rash.  Neurological:     Mental Status: She is alert.  Psychiatric:        Mood and Affect: Mood normal.        Behavior: Behavior normal.         Outpatient Encounter Medications as of 03/07/2024  Medication Sig   acetaminophen  (TYLENOL ) 650 MG CR tablet Take  650 mg by mouth every 8 (eight) hours as needed for pain.   Albuterol  Sulfate (PROAIR  RESPICLICK) 108 (90 Base) MCG/ACT AEPB Inhale 2 puffs into the lungs every 6 (six) hours as needed.  amLODipine  (NORVASC ) 5 MG tablet TAKE 1 TABLET BY MOUTH ONCE DAILY AT DINNER TIME   apixaban  (ELIQUIS ) 2.5 MG TABS tablet Take 1 tablet (2.5 mg total) by mouth 2 (two) times daily.   Ascorbic Acid  (VITAMIN C) 250 MG CHEW Chew 250 mg by mouth daily.    calcium  citrate-vitamin D  500-500 MG-UNIT chewable tablet Chew 3 tablets by mouth daily.   Cranberry-Vitamin C-Probiotic (AZO CRANBERRY PO) Take 1 tablet by mouth daily.   fluticasone -salmeterol (ADVAIR) 100-50 MCG/ACT AEPB Inhale 1 puff into the lungs 2 (two) times daily.   letrozole (FEMARA) 2.5 MG tablet Take 2.5 mg by mouth daily.   losartan  (COZAAR ) 100 MG tablet TAKE 1 TABLET BY MOUTH ONCE DAILY   Multiple Vitamin (MULTIVITAMIN) tablet Take 1 tablet by mouth daily.   mupirocin  ointment (BACTROBAN ) 2 % Apply 1 Application topically 2 (two) times daily.   ondansetron  (ZOFRAN -ODT) 4 MG disintegrating tablet Take 1 tablet (4 mg total) by mouth every 8 (eight) hours as needed for nausea or vomiting.   Probiotic Product (PROBIOTIC ADVANCED PO) Take 1 capsule by mouth daily.   propranolol  (INDERAL ) 10 MG tablet Take 1 tablet (10 mg total) by mouth 3 (three) times daily as needed.   rosuvastatin  (CRESTOR ) 5 MG tablet Take 1 tablet (5 mg total) by mouth every evening. TAKE 1 TABLET BY MOUTH ONCE EVERY EVENING   sertraline  (ZOLOFT ) 50 MG tablet TAKE 1 TABLET BY MOUTH ONCE EVERY MORNING   No facility-administered encounter medications on file as of 03/07/2024.     Lab Results  Component Value Date   WBC 4.8 03/02/2024   HGB 13.2 03/02/2024   HCT 39.4 03/02/2024   PLT 283.0 03/02/2024   GLUCOSE 98 03/02/2024   CHOL 142 03/02/2024   TRIG 90.0 03/02/2024   HDL 67.80 03/02/2024   LDLDIRECT 118.0 10/06/2022   LDLCALC 56 03/02/2024   ALT 15 03/02/2024   AST 23  03/02/2024   NA 142 03/02/2024   K 4.3 03/02/2024   CL 104 03/02/2024   CREATININE 1.00 03/02/2024   BUN 23 03/02/2024   CO2 30 03/02/2024   TSH 4.88 07/12/2023   HGBA1C 5.9 03/02/2024       Assessment & Plan:  Decreased GFR Assessment & Plan: GFR decreased from recent checks. Stay hydrated. Not taking antiinflammatory medication. Recheck metabolic panel in 3-4 weeks.   Orders: -     Basic metabolic panel with GFR; Future  Essential hypertension -     Basic metabolic panel with GFR; Future  Hyperglycemia Assessment & Plan: Recent A1c 5.9. follow met b and A1c.   Orders: -     Hemoglobin A1c; Future  Hypercholesterolemia -     Hepatic function panel; Future -     Lipid panel; Future -     TSH; Future     Dellar Fenton, MD

## 2024-03-07 NOTE — Assessment & Plan Note (Signed)
 Hgb just checked improved and now wnl - 13.

## 2024-03-07 NOTE — Assessment & Plan Note (Signed)
 On crestor . Follow lipid panel and liver function tests. No changes in medication today.  Lab Results  Component Value Date   CHOL 142 03/02/2024   HDL 67.80 03/02/2024   LDLCALC 56 03/02/2024   LDLDIRECT 118.0 10/06/2022   TRIG 90.0 03/02/2024   CHOLHDL 2 03/02/2024

## 2024-03-07 NOTE — Assessment & Plan Note (Signed)
 Continues on eliquis .  Has propranolol  to take prn. Followed by Dr Gollan. Stable.

## 2024-03-07 NOTE — Assessment & Plan Note (Signed)
 Continue crestor

## 2024-03-07 NOTE — Assessment & Plan Note (Signed)
Continue to follow iron studies annually.  Saw hematology.   ?

## 2024-03-07 NOTE — Assessment & Plan Note (Signed)
 GFR decreased from recent checks. Stay hydrated. Not taking antiinflammatory medication. Recheck metabolic panel in 3-4 weeks.

## 2024-03-07 NOTE — Assessment & Plan Note (Signed)
 Had f/u with Dr Viva Grise 01/18/24 - cough variant asthma. Advair bid. Albuterol  prn. Breathing stable.

## 2024-03-07 NOTE — Assessment & Plan Note (Signed)
 Discussed drinking fluids - early in day. Discussed possible sleep apnea. Some snoring. Wants to hold on any further evaluation at this time. Follow.

## 2024-03-07 NOTE — Assessment & Plan Note (Signed)
 Continue amlodipine  and losartan .  Blood pressure as outlined.  Follow pressures. Recheck metabolic panel as outlined. No changes in medication today.

## 2024-03-07 NOTE — Assessment & Plan Note (Addendum)
 Per review, had previously noted thyroid  nodules on carotid ultrasound.  F/u thyroid  ultrasound revealed - mild thyromegaly with bilateral cysts and complex cysts.  Per report, none meet criteria for biopsy or follow up. Discussed.

## 2024-03-07 NOTE — Assessment & Plan Note (Signed)
Afib.  On eliquis.  

## 2024-03-07 NOTE — Assessment & Plan Note (Signed)
Had f/u with Dr Maia Plan 05/17/23 - f/u breast cancer surveillance. F/u mammogram 05/10/23 - ok. Recommended f/u in one year.

## 2024-03-12 ENCOUNTER — Other Ambulatory Visit: Payer: Self-pay | Admitting: Cardiovascular Disease

## 2024-04-04 ENCOUNTER — Other Ambulatory Visit (INDEPENDENT_AMBULATORY_CARE_PROVIDER_SITE_OTHER)

## 2024-04-04 DIAGNOSIS — E7800 Pure hypercholesterolemia, unspecified: Secondary | ICD-10-CM

## 2024-04-04 DIAGNOSIS — R739 Hyperglycemia, unspecified: Secondary | ICD-10-CM

## 2024-04-04 DIAGNOSIS — R944 Abnormal results of kidney function studies: Secondary | ICD-10-CM

## 2024-04-04 LAB — HEPATIC FUNCTION PANEL
ALT: 17 U/L (ref 0–35)
AST: 19 U/L (ref 0–37)
Albumin: 4.3 g/dL (ref 3.5–5.2)
Alkaline Phosphatase: 56 U/L (ref 39–117)
Bilirubin, Direct: 0.2 mg/dL (ref 0.0–0.3)
Total Bilirubin: 0.7 mg/dL (ref 0.2–1.2)
Total Protein: 7.2 g/dL (ref 6.0–8.3)

## 2024-04-04 LAB — LIPID PANEL
Cholesterol: 134 mg/dL (ref 0–200)
HDL: 61.7 mg/dL (ref >39.00)
LDL Cholesterol: 54 mg/dL (ref 0–99)
NonHDL: 72.51
Total CHOL/HDL Ratio: 2
Triglycerides: 93 mg/dL (ref 0.0–149.0)
VLDL: 18.6 mg/dL (ref 0.0–40.0)

## 2024-04-04 LAB — BASIC METABOLIC PANEL WITH GFR
BUN: 19 mg/dL (ref 6–23)
CO2: 31 meq/L (ref 19–32)
Calcium: 9.8 mg/dL (ref 8.4–10.5)
Chloride: 101 meq/L (ref 96–112)
Creatinine, Ser: 1.02 mg/dL (ref 0.40–1.20)
GFR: 49.27 mL/min — ABNORMAL LOW (ref >60.00)
Glucose, Bld: 104 mg/dL — ABNORMAL HIGH (ref 70–99)
Potassium: 4.7 meq/L (ref 3.5–5.1)
Sodium: 137 meq/L (ref 135–145)

## 2024-04-04 LAB — TSH: TSH: 3.48 u[IU]/mL (ref 0.35–5.50)

## 2024-04-04 LAB — HEMOGLOBIN A1C: Hgb A1c MFr Bld: 6.1 % (ref 4.6–6.5)

## 2024-04-05 ENCOUNTER — Other Ambulatory Visit: Payer: Self-pay | Admitting: General Surgery

## 2024-04-05 ENCOUNTER — Ambulatory Visit: Payer: Self-pay | Admitting: Internal Medicine

## 2024-04-05 DIAGNOSIS — Z1231 Encounter for screening mammogram for malignant neoplasm of breast: Secondary | ICD-10-CM

## 2024-05-08 ENCOUNTER — Other Ambulatory Visit: Payer: Self-pay | Admitting: Cardiovascular Disease

## 2024-05-08 ENCOUNTER — Other Ambulatory Visit: Payer: Self-pay | Admitting: Internal Medicine

## 2024-05-11 ENCOUNTER — Ambulatory Visit
Admission: RE | Admit: 2024-05-11 | Discharge: 2024-05-11 | Disposition: A | Discharge status: Home / Self Care | Source: Ambulatory Visit | Attending: General Surgery | Admitting: General Surgery

## 2024-05-11 DIAGNOSIS — Z1231 Encounter for screening mammogram for malignant neoplasm of breast: Secondary | ICD-10-CM | POA: Diagnosis present

## 2024-05-17 ENCOUNTER — Other Ambulatory Visit: Payer: Self-pay | Admitting: Pulmonary Disease

## 2024-05-17 ENCOUNTER — Other Ambulatory Visit: Payer: Self-pay | Admitting: Cardiovascular Disease

## 2024-05-17 DIAGNOSIS — I4891 Unspecified atrial fibrillation: Secondary | ICD-10-CM

## 2024-05-17 NOTE — Telephone Encounter (Signed)
 Prescription refill request for Eliquis  received. Indication: Afib  Last office visit: 05/03/23 Florestine)  Scr: 1.02 (04/04/24)  Age: 89 Weight: 57.4kg  Office visit overdue. Message sent to schedulers.

## 2024-05-17 NOTE — Telephone Encounter (Signed)
 Refill Request.

## 2024-05-18 NOTE — Telephone Encounter (Signed)
 Called and spoke to pt who stated that she was completely out of Eliquis  and she was about to go out to run errands.  Informed pt that I would send in a refill for Eliquis  because it is medication we do not want her to miss. However, informed her that she would need to schedule an appointment with cardiologist for future refills. Transferred pt to the [facility] to make appointment.    Pt scheduled to see Dr. Gollan on 9/30

## 2024-07-02 NOTE — Progress Notes (Unsigned)
 Cardiology Office Note  Date:  07/03/2024   ID:  Anita Benjamin, DOB January 29, 1934, MRN [id number]  PCP:  Anita Shad, MD   Chief Complaint  Patient presents with   12 month follow up     Doing well.     HPI:  Ms. Anita Benjamin is a very pleasant 89 y.o. woman, with a history of  Paroxysmal tachycardia, SVT episodes,  atrial flutter,  hypotension. several year history of tachyarrhythmia. High BP in doctor offices Bradycardia 2019 on beta-blocker and amiodarone  for flutter She presents today for follow-up of her tachycardia and chest pain  Last seen by myself in clinic 7/24 In follow-up today reports she is doing well Tries to stay active, go shopping, housework, walks her driveway Feels her balance is stable No chest pain or shortness of breath on exertion  Rare palpitations Will last less than a few minutes Taking propanolol 10 milligrams every morning Heart rate low today 52 bpm Measurement at home 54 bpm  Feels her breathing is stable  Lab work reviewed Total cholesterol 134 LDL 54, tolerating Crestor  5 A1c 6.1 Normal LFTs Creatinine 1.0 potassium 4.7  History of breast surgery completed, lumpectomy  EKG personally reviewed by myself on todays visit EKG Interpretation Date/Time:  Tuesday July 03 2024 14:45:07 EDT Ventricular Rate:  52 PR Interval:  162 QRS Duration:  78 QT Interval:  432 QTC Calculation: 401 R Axis:   5  Text Interpretation: Sinus bradycardia When compared with ECG of 07-Nov-2023 16:18, Premature atrial complexes are no longer Present Confirmed by Anita Benjamin [id number]) on 07/03/2024 2:49:52 PM    Past medical history reviewed Had PNA 12/3031 Fever, RSV, hypoxia sats 70% probably got from grandchildren Went into atrial fib 12/29/20, rate 130 bpm Converted to NSR  Other past medical history reviewed  syncope 05/23/2019  occurring after eating a spicy sandwich leading to abdominal pain and diarrhea.  feeling sick Felt to be vasovagal  syncope was bending over and does not remember falling.  Came to on the ground.  Did not feel bad prior to this last episode.   echo and carotid ultrasound in 10.2020.    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  (possibly amiodarone ).  Off amiodarone  now.   Echocardiogram, 1. Left ventricular ejection fraction, by visual estimation, is 60 to 65%. The left ventricle has normal function. Normal left ventricular size. There is no left ventricular hypertrophy.  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  Normal carotid ultrasound.    November 2019 for atrial flutter, started on amiodarone , early later converted to normal sinus rhythm Metoprolol  held for bradycardia Remains on amiodarone  200 mg daily   10/01/2016 she had atrial flutter rate 120 bpm  started on amiodarone , metoprolol , eliquis .   PMH:   has a past medical history of Anemia, Atrial fibrillation (HCC), Atrial flutter (HCC), Fibrocystic breast disease, GERD (gastroesophageal reflux disease), History of chicken pox, Hypercholesterolemia, and Hypertension.  PSH:    Past Surgical History:  Procedure Laterality Date   BREAST BIOPSY Right 2014   NEG   BREAST BIOPSY Right 05/24/2022   stereo bx asymmetry, x marker, benign   BREAST BIOPSY Right 05/25/2022   US  Core BX, Coil Clip - benign   BREAST LUMPECTOMY     BREAST LUMPECTOMY WITH SENTINEL LYMPH NODE BIOPSY Left 06/04/2020   Procedure:  BREAST LUMPECTOMY WITH SENTINEL LYMPH NODE BX;  Surgeon: Dessa Reyes ORN, MD;  Location: [facility] ORS;  Service: General;  Laterality: Left;   CATARACT EXTRACTION     DILATION AND CURETTAGE OF UTERUS     Laser repair of torn retina      Current Outpatient Medications  Medication  Sig Dispense Refill   acetaminophen  (TYLENOL ) 650 MG CR tablet Take 650 mg by mouth every 8 (eight) hours as needed for pain.     Albuterol  Sulfate (PROAIR  RESPICLICK) 108 (90 Base) MCG/ACT AEPB Inhale 2 puffs into the lungs every 6 (six) hours as needed. 1 each 3   amLODipine  (NORVASC ) 5 MG tablet TAKE 1 TABLET BY MOUTH ONCE DAILY AT DINNER TIME AS DIRECTED 90 tablet 0   apixaban  (ELIQUIS ) 2.5 MG TABS tablet TAKE 1 TABLET BY MOUTH TWICE DAILY 180 tablet 0   Ascorbic Acid  (VITAMIN C) 250 MG CHEW Chew 250 mg by mouth daily.      calcium  citrate-vitamin D  500-500 MG-UNIT chewable tablet Chew 3 tablets by mouth daily.     Cranberry-Vitamin C-Probiotic (AZO CRANBERRY PO) Take 1 tablet by mouth daily.     fluticasone -salmeterol (ADVAIR) 100-50 MCG/ACT AEPB INHALE 1 PUFF INTO THE LUNGS TWICE DAILYRINSE MOUTH AFTER EACH USE 60 each 11   letrozole (FEMARA) 2.5 MG tablet Take 2.5 mg by mouth daily.     losartan  (COZAAR ) 100 MG tablet TAKE 1 TABLET BY MOUTH ONCE DAILY 90 tablet 1   Multiple Vitamin (MULTIVITAMIN) tablet Take 1 tablet by mouth daily.     mupirocin  ointment (BACTROBAN ) 2 % Apply 1 Application topically 2 (two) times daily. 22 g 0   ondansetron  (ZOFRAN -ODT) 4 MG disintegrating tablet Take 1 tablet (4 mg total) by mouth every 8 (eight) hours as needed for nausea or vomiting. 12 tablet 0   Probiotic Product (PROBIOTIC ADVANCED PO) Take 1 capsule by mouth daily.     propranolol  (INDERAL ) 10 MG tablet TAKE 1 TABLET BY MOUTH 3 TIMES DAILY AS NEEDED 270 tablet 0   rosuvastatin  (CRESTOR ) 5 MG tablet Take 1 tablet (5 mg total) by mouth every evening. TAKE 1 TABLET BY MOUTH ONCE EVERY EVENING 90 tablet 3   sertraline  (ZOLOFT ) 50 MG tablet TAKE 1 TABLET BY MOUTH ONCE EVERY MORNING 90 tablet 1   No current facility-administered medications for this visit.    Allergies:   Oruvail [ketoprofen], Relafen [nabumetone], and Timentin [ticarcillin-pot clavulanate]   Social History:  The patient  reports  that she has never smoked. She has never used smokeless tobacco. She reports that she does not drink alcohol and does not use drugs.   Family History:   family history includes Breast cancer in her paternal aunt; Cancer (age of onset: 41) in her father; Heart disease (age of onset: 25) in her mother.    Review of Systems: Review of Systems  Constitutional: Negative.   HENT: Negative.    Eyes: Negative.   Respiratory: Negative.    Cardiovascular: Negative.  Negative for palpitations.       Paroxysmal tachycardia  Gastrointestinal: Negative.   Genitourinary: Negative.   Musculoskeletal: Negative.   Neurological: Negative.   Psychiatric/Behavioral: Negative.    All other systems reviewed and are negative.   PHYSICAL EXAM: VS:  Ht 5' 6.5 (1.689 m)   Wt 128 lb 4 oz (58.2 kg)   LMP 10/27/1989   BMI 20.39 kg/m  , BMI Body mass index is 20.39 kg/m. Constitutional:  oriented to person, place, and time.  No distress.  HENT:  Head: Grossly normal Eyes:  no discharge. No scleral icterus.  Neck: No JVD, no carotid bruits  Cardiovascular: Regular rate and rhythm, no murmurs appreciated Pulmonary/Chest: Clear to auscultation bilaterally, no wheezes or rales Abdominal: Soft.  no distension.  no tenderness.  Musculoskeletal: Normal range of motion Neurological:  normal muscle tone. Coordination normal. No atrophy Skin: Skin warm and dry Psychiatric: normal affect, pleasant  Recent Labs: 03/02/2024: Hemoglobin 13.2; Platelets 283.0 04/04/2024: ALT 17; BUN 19; Creatinine, Ser 1.02; Potassium 4.7; Sodium 137; TSH 3.48    Lipid Panel Lab Results  Component Value Date   CHOL 134 04/04/2024   HDL 61.70 04/04/2024   LDLCALC 54 04/04/2024   TRIG 93.0 04/04/2024    Wt Readings from Last 3 Encounters:  07/03/24 128 lb 4 oz (58.2 kg)  03/07/24 126 lb 9.6 oz (57.4 kg)  01/18/24 127 lb 6.4 oz (57.8 kg)     ASSESSMENT AND PLAN:  Syncope Prior episode vasovagal syncope in the setting of  GI distress, diarrhea no recent episodes Blood pressure stable  Thyroid  nodules Seen on carotid ultrasound,  thyroid  ultrasound: Mild thyromegaly with bilateral cysts and complex cysts.   Essential hypertension Blood pressure is well controlled on today's visit. No changes made to the medications.  Bradycardia Asymptomatic, likely exacerbated by taking propranolol  10 mg every morning Recommend she hold the propranolol  and take this as needed for tachycardia spells For worsening spells, may need to restart the propranolol  5 up to 10 mg daily  Typical atrial flutter (HCC) Continue anticoagulation Eliquis  reduced dose 2.5 twice daily Metoprolol  succinate and diltiazem  off her medication list Propranolol  10 either daily (though has bradycardia) or as needed  Chronic fatigue Poor sleep hygiene  Chest pain No recent chest pain symptoms  Encounter for anticoagulation discussion and counseling Continue Eliquis  2.5 twice daily, CBC stable   Orders Placed This Encounter  Procedures   EKG 12-Lead    Signed, Velinda Lunger, M.D., Ph.D. 07/03/2024  [facility] [place], [place] [phone]

## 2024-07-03 ENCOUNTER — Ambulatory Visit: Attending: Cardiovascular Disease | Admitting: Cardiovascular Disease

## 2024-07-03 ENCOUNTER — Encounter: Payer: Self-pay | Admitting: Cardiovascular Disease

## 2024-07-03 VITALS — BP 140/78 | HR 52 | Ht 66.5 in | Wt 128.2 lb

## 2024-07-03 DIAGNOSIS — I4891 Unspecified atrial fibrillation: Secondary | ICD-10-CM | POA: Diagnosis not present

## 2024-07-03 DIAGNOSIS — E7800 Pure hypercholesterolemia, unspecified: Secondary | ICD-10-CM

## 2024-07-03 DIAGNOSIS — R0609 Other forms of dyspnea: Secondary | ICD-10-CM | POA: Diagnosis not present

## 2024-07-03 DIAGNOSIS — I700 Atherosclerosis of aorta: Secondary | ICD-10-CM

## 2024-07-03 DIAGNOSIS — I10 Essential (primary) hypertension: Secondary | ICD-10-CM | POA: Diagnosis not present

## 2024-07-03 DIAGNOSIS — I483 Typical atrial flutter: Secondary | ICD-10-CM

## 2024-07-03 MED ORDER — APIXABAN 2.5 MG PO TABS: 2.5000 mg | ORAL_TABLET | Freq: Two times a day (BID) | ORAL | 1 refills | Status: DC

## 2024-07-03 MED ORDER — AMLODIPINE BESYLATE 5 MG PO TABS: ORAL_TABLET | ORAL | 3 refills | Status: AC

## 2024-07-03 NOTE — Patient Instructions (Addendum)
 Medication Instructions:   Monitor heart rate at home Hold the propranolol  and see if heart races If heart races, restart propranolol  10 mg once a day  If you need a refill on your cardiac medications before your next appointment, please call your pharmacy.   Lab work: No new labs needed  Testing/Procedures: No new testing needed  Follow-Up: At [facility], you and your health needs are our priority.  As part of our continuing mission to provide you with exceptional heart care, we have created designated Provider Care Teams.  These Care Teams include your primary Cardiologist (physician) and Advanced Practice Providers (APPs -  Physician Assistants and Nurse Practitioners) who all work together to provide you with the care you need, when you need it.  You will need a follow up appointment in 12 months  Providers on your designated Care Team:   Lonni Meager, NP Bernardino Bring, PA-C Cadence [place], [place]  COVID-19 Vaccine Information can be found at: [email or web address] For questions related to vaccine distribution or appointments, please email [email or web address] or call [phone].

## 2024-07-06 ENCOUNTER — Encounter: Payer: Self-pay | Admitting: Internal Medicine

## 2024-07-06 NOTE — Telephone Encounter (Signed)
 I am not aware of any interactions with her current medications, but is not FDA approved. Not sure of benefit - to date. Can discuss with her more during her upcoming appt

## 2024-07-09 ENCOUNTER — Other Ambulatory Visit (INDEPENDENT_AMBULATORY_CARE_PROVIDER_SITE_OTHER)

## 2024-07-09 DIAGNOSIS — I10 Essential (primary) hypertension: Secondary | ICD-10-CM

## 2024-07-09 LAB — BASIC METABOLIC PANEL WITH GFR
BUN: 21 mg/dL (ref 6–23)
CO2: 28 meq/L (ref 19–32)
Calcium: 9.7 mg/dL (ref 8.4–10.5)
Chloride: 105 meq/L (ref 96–112)
Creatinine, Ser: 0.88 mg/dL (ref 0.40–1.20)
GFR: 58.71 mL/min — ABNORMAL LOW (ref >60.00)
Glucose, Bld: 91 mg/dL (ref 70–99)
Potassium: 4.5 meq/L (ref 3.5–5.1)
Sodium: 143 meq/L (ref 135–145)

## 2024-07-10 ENCOUNTER — Ambulatory Visit (INDEPENDENT_AMBULATORY_CARE_PROVIDER_SITE_OTHER)

## 2024-07-10 ENCOUNTER — Other Ambulatory Visit: Payer: Self-pay | Admitting: Internal Medicine

## 2024-07-10 DIAGNOSIS — E7800 Pure hypercholesterolemia, unspecified: Secondary | ICD-10-CM | POA: Diagnosis not present

## 2024-07-10 LAB — LIPID PANEL
Cholesterol: 158 mg/dL (ref 0–200)
HDL: 75 mg/dL (ref >39.00)
LDL Cholesterol: 65 mg/dL (ref 0–99)
NonHDL: 83.27
Total CHOL/HDL Ratio: 2
Triglycerides: 91 mg/dL (ref 0.0–149.0)
VLDL: 18.2 mg/dL (ref 0.0–40.0)

## 2024-07-10 LAB — HEPATIC FUNCTION PANEL
ALT: 15 U/L (ref 0–35)
AST: 22 U/L (ref 0–37)
Albumin: 4.5 g/dL (ref 3.5–5.2)
Alkaline Phosphatase: 59 U/L (ref 39–117)
Bilirubin, Direct: 0.1 mg/dL (ref 0.0–0.3)
Total Bilirubin: 0.5 mg/dL (ref 0.2–1.2)
Total Protein: 6.8 g/dL (ref 6.0–8.3)

## 2024-07-10 NOTE — Progress Notes (Signed)
 Order placed for add on labs. Message sent ot lab.

## 2024-07-11 ENCOUNTER — Ambulatory Visit: Payer: Self-pay | Admitting: Internal Medicine

## 2024-07-13 ENCOUNTER — Ambulatory Visit: Admitting: Internal Medicine

## 2024-07-13 ENCOUNTER — Encounter: Payer: Self-pay | Admitting: Internal Medicine

## 2024-07-13 VITALS — BP 129/69 | HR 71 | Ht 66.5 in | Wt 128.4 lb

## 2024-07-13 DIAGNOSIS — D6859 Other primary thrombophilia: Secondary | ICD-10-CM

## 2024-07-13 DIAGNOSIS — Z23 Encounter for immunization: Secondary | ICD-10-CM | POA: Diagnosis not present

## 2024-07-13 DIAGNOSIS — F439 Reaction to severe stress, unspecified: Secondary | ICD-10-CM

## 2024-07-13 DIAGNOSIS — Z148 Genetic carrier of other disease: Secondary | ICD-10-CM

## 2024-07-13 DIAGNOSIS — R739 Hyperglycemia, unspecified: Secondary | ICD-10-CM | POA: Diagnosis not present

## 2024-07-13 DIAGNOSIS — R413 Other amnesia: Secondary | ICD-10-CM

## 2024-07-13 DIAGNOSIS — I4891 Unspecified atrial fibrillation: Secondary | ICD-10-CM

## 2024-07-13 DIAGNOSIS — E7800 Pure hypercholesterolemia, unspecified: Secondary | ICD-10-CM | POA: Diagnosis not present

## 2024-07-13 DIAGNOSIS — Z0000 Encounter for general adult medical examination without abnormal findings: Secondary | ICD-10-CM

## 2024-07-13 DIAGNOSIS — J4520 Mild intermittent asthma, uncomplicated: Secondary | ICD-10-CM

## 2024-07-13 DIAGNOSIS — I10 Essential (primary) hypertension: Secondary | ICD-10-CM

## 2024-07-13 DIAGNOSIS — I700 Atherosclerosis of aorta: Secondary | ICD-10-CM

## 2024-07-13 LAB — POCT GLYCOSYLATED HEMOGLOBIN (HGB A1C): Hemoglobin A1C: 5.5 % (ref 4.0–5.6)

## 2024-07-13 MED ORDER — ROSUVASTATIN CALCIUM 5 MG PO TABS
5.0000 mg | ORAL_TABLET | Freq: Every evening | ORAL | 3 refills | Status: AC
Start: 2024-07-13 — End: ?

## 2024-07-13 MED ORDER — LOSARTAN POTASSIUM 100 MG PO TABS: 100.0000 mg | ORAL_TABLET | Freq: Every day | ORAL | 1 refills | Status: AC

## 2024-07-13 NOTE — Assessment & Plan Note (Addendum)
 Physical scheduled for today 07/13/24. Appt changed to f/u.  Saw GI.  cologuard negative.  Mammogram 05/16/24 - Birads I.

## 2024-07-13 NOTE — Progress Notes (Signed)
 Subjective:    Patient ID: Anita Benjamin, female    DOB: Dec 14, 1933, 89 y.o.   MRN: [id number]  Patient here for  Chief Complaint  Patient presents with   Medical Management of Chronic Issues    HPI Here for a scheduled follow up. Had f/u with cardiology 07/03/24 - reported rare palpitations. Was taking propranolol  q am. Instructed by cardiology to take prn. Conitnue eliquis . Had f/u with Dr Cesar 05/18/24. Stable. Recommended mammogram in one year. Had f/u with Dr Tamea 01/18/24 - f/u regarding cough variant asthma. Recommended continuing advair. Albuterol  inhaler if needed. She reports breathing is stable. No increased cough. No chest pain. Denies any increased heart rate. Eating. No abdominal pain or bowel change reported. Does report - daughter is concerned regarding her memory. She reports she has noticed some change. No problems with her daily activities or driving. States has problems when talking - remembering and then will later think of what she could not remember. Discussed further w/up and evaluation.    Past Medical History:  Diagnosis Date   Anemia    Atrial fibrillation (HCC)    Atrial flutter (HCC)    Fibrocystic breast disease    GERD (gastroesophageal reflux disease)    History of chicken pox    Hypercholesterolemia    Hypertension    Past Surgical History:  Procedure Laterality Date   BREAST BIOPSY Right 2014   NEG   BREAST BIOPSY Right 05/24/2022   stereo bx asymmetry, x marker, benign   BREAST BIOPSY Right 05/25/2022   US  Core BX, Coil Clip - benign   BREAST LUMPECTOMY     BREAST LUMPECTOMY WITH SENTINEL LYMPH NODE BIOPSY Left 06/04/2020   Procedure: BREAST LUMPECTOMY WITH SENTINEL LYMPH NODE BX;  Surgeon: Dessa Reyes ORN, MD;  Location: [facility] ORS;  Service: General;  Laterality: Left;   CATARACT EXTRACTION     DILATION AND CURETTAGE OF UTERUS     Laser repair of torn retina     Family History  Problem Relation Age of Onset   Heart disease Mother 65        Heart attack   Cancer Father 69       lung cancer   Breast cancer Paternal Aunt        x3   Colon cancer Neg Hx    Social History   Socioeconomic History   Marital status: Widowed    Spouse name: Not on file   Number of children: 2   Years of education: Not on file   Highest education level: Not on file  Occupational History   Not on file  Tobacco Use   Smoking status: Never   Smokeless tobacco: Never  Vaping Use   Vaping status: Never Used  Substance and Sexual Activity   Alcohol use: No    Alcohol/week: 0.0 standard drinks of alcohol   Drug use: No   Sexual activity: Not on file  Other Topics Concern   Not on file  Social History Narrative   Not on file   Social Drivers of Health   Financial Resource Strain: Low Risk  (02/13/2018)   Overall Financial Resource Strain (CARDIA)    Difficulty of Paying Living Expenses: Not hard at all  Food Insecurity: No Food Insecurity (02/13/2018)   Hunger Vital Sign    Worried About Running Out of Food in the Last Year: Never true    Ran Out of Food in the Last Year: Never true  Transportation Needs: No Transportation  Needs (02/13/2018)   PRAPARE - Administrator, Civil Service (Medical): No    Lack of Transportation (Non-Medical): No  Physical Activity: Unknown (02/13/2018)   Exercise Vital Sign    Days of Exercise per Week: 0 days    Minutes of Exercise per Session: Not on file  Stress: No Stress Concern Present (02/13/2018)   [employer] of Occupational Health - Occupational Stress Questionnaire    Feeling of Stress : Not at all  Social Connections: Not on file     Review of Systems  Constitutional:  Negative for appetite change and unexpected weight change.  HENT:  Negative for congestion and sinus pressure.   Respiratory:  Negative for cough, chest tightness and shortness of breath.   Cardiovascular:  Negative for chest pain, palpitations and leg swelling.  Gastrointestinal:  Negative for  abdominal pain, diarrhea, nausea and vomiting.  Genitourinary:  Negative for difficulty urinating and dysuria.  Musculoskeletal:  Negative for joint swelling and myalgias.  Skin:  Negative for color change and rash.  Neurological:  Negative for dizziness and headaches.       Memory issues reported as outlined.   Psychiatric/Behavioral:  Negative for agitation and dysphoric mood.        Objective:     BP 129/69   Pulse 71   Ht 5' 6.5 (1.689 m)   Wt 128 lb 6.4 oz (58.2 kg)   LMP 10/27/1989   SpO2 97%   BMI 20.41 kg/m  Wt Readings from Last 3 Encounters:  07/13/24 128 lb 6.4 oz (58.2 kg)  07/03/24 128 lb 4 oz (58.2 kg)  03/07/24 126 lb 9.6 oz (57.4 kg)    Physical Exam Vitals reviewed.  Constitutional:      General: She is not in acute distress.    Appearance: Normal appearance.  HENT:     Head: Normocephalic and atraumatic.     Right Ear: External ear normal.     Left Ear: External ear normal.     Mouth/Throat:     Pharynx: No oropharyngeal exudate or posterior oropharyngeal erythema.  Eyes:     General: No scleral icterus.       Right eye: No discharge.        Left eye: No discharge.     Conjunctiva/sclera: Conjunctivae normal.  Neck:     Thyroid : No thyromegaly.  Cardiovascular:     Rate and Rhythm: Normal rate.     Comments: Rate controlled.  Pulmonary:     Effort: No respiratory distress.     Breath sounds: Normal breath sounds. No wheezing.  Abdominal:     General: Bowel sounds are normal.     Palpations: Abdomen is soft.     Tenderness: There is no abdominal tenderness.  Musculoskeletal:        General: No swelling or tenderness.     Cervical back: Neck supple. No tenderness.  Lymphadenopathy:     Cervical: No cervical adenopathy.  Skin:    Findings: No erythema or rash.  Neurological:     Mental Status: She is alert.  Psychiatric:        Mood and Affect: Mood normal.        Behavior: Behavior normal.         Outpatient Encounter  Medications as of 07/13/2024  Medication Sig   acetaminophen  (TYLENOL ) 650 MG CR tablet Take 650 mg by mouth every 8 (eight) hours as needed for pain.   Albuterol  Sulfate (PROAIR  RESPICLICK) 108 (90 Base)  MCG/ACT AEPB Inhale 2 puffs into the lungs every 6 (six) hours as needed.   amLODipine  (NORVASC ) 5 MG tablet TAKE 1 TABLET BY MOUTH ONCE DAILY AT DINNER TIME AS DIRECTED   apixaban  (ELIQUIS ) 2.5 MG TABS tablet Take 1 tablet (2.5 mg total) by mouth 2 (two) times daily.   Ascorbic Acid  (VITAMIN C) 250 MG CHEW Chew 250 mg by mouth daily.    calcium  citrate-vitamin D  500-500 MG-UNIT chewable tablet Chew 3 tablets by mouth daily.   Cranberry-Vitamin C-Probiotic (AZO CRANBERRY PO) Take 1 tablet by mouth daily.   fluticasone -salmeterol (ADVAIR) 100-50 MCG/ACT AEPB INHALE 1 PUFF INTO THE LUNGS TWICE DAILYRINSE MOUTH AFTER EACH USE   letrozole (FEMARA) 2.5 MG tablet Take 2.5 mg by mouth daily.   Multiple Vitamin (MULTIVITAMIN) tablet Take 1 tablet by mouth daily.   mupirocin  ointment (BACTROBAN ) 2 % Apply 1 Application topically 2 (two) times daily.   ondansetron  (ZOFRAN -ODT) 4 MG disintegrating tablet Take 1 tablet (4 mg total) by mouth every 8 (eight) hours as needed for nausea or vomiting.   Probiotic Product (PROBIOTIC ADVANCED PO) Take 1 capsule by mouth daily.   propranolol  (INDERAL ) 10 MG tablet TAKE 1 TABLET BY MOUTH 3 TIMES DAILY AS NEEDED   sertraline  (ZOLOFT ) 50 MG tablet TAKE 1 TABLET BY MOUTH ONCE EVERY MORNING   losartan  (COZAAR ) 100 MG tablet Take 1 tablet (100 mg total) by mouth daily.   rosuvastatin  (CRESTOR ) 5 MG tablet Take 1 tablet (5 mg total) by mouth every evening. TAKE 1 TABLET BY MOUTH ONCE EVERY EVENING   [DISCONTINUED] losartan  (COZAAR ) 100 MG tablet TAKE 1 TABLET BY MOUTH ONCE DAILY   [DISCONTINUED] rosuvastatin  (CRESTOR ) 5 MG tablet Take 1 tablet (5 mg total) by mouth every evening. TAKE 1 TABLET BY MOUTH ONCE EVERY EVENING   No facility-administered encounter medications  on file as of 07/13/2024.     Lab Results  Component Value Date   WBC 4.8 03/02/2024   HGB 13.2 03/02/2024   HCT 39.4 03/02/2024   PLT 283.0 03/02/2024   GLUCOSE 91 07/09/2024   CHOL 158 07/10/2024   TRIG 91.0 07/10/2024   HDL 75.00 07/10/2024   LDLDIRECT 118.0 10/06/2022   LDLCALC 65 07/10/2024   ALT 15 07/10/2024   AST 22 07/10/2024   NA 143 07/09/2024   K 4.5 07/09/2024   CL 105 07/09/2024   CREATININE 0.88 07/09/2024   BUN 21 07/09/2024   CO2 28 07/09/2024   TSH 3.48 04/04/2024   HGBA1C 5.5 07/13/2024    MM 3D SCREENING MAMMOGRAM BILATERAL BREAST Result Date: 05/16/2024 CLINICAL DATA:  Screening. EXAM: DIGITAL SCREENING BILATERAL MAMMOGRAM WITH TOMOSYNTHESIS AND CAD TECHNIQUE: Bilateral screening digital craniocaudal and mediolateral oblique mammograms were obtained. Bilateral screening digital breast tomosynthesis was performed. The images were evaluated with computer-aided detection. COMPARISON:  Previous exam(s). ACR Breast Density Category b: There are scattered areas of fibroglandular density. FINDINGS: There are no findings suspicious for malignancy. IMPRESSION: No mammographic evidence of malignancy. A result letter of this screening mammogram will be mailed directly to the patient. RECOMMENDATION: Screening mammogram in one year. (Code:SM-B-01Y) BI-RADS CATEGORY  1: Negative. Electronically Signed   By: Dina  Arceo M.D.   On: 05/16/2024 05:09       Assessment & Plan:  Essential hypertension Assessment & Plan: Continues on amlodipine  and losartan . Now taking propranolol  prn. Follow pressures. Follow metabolic panel. No change in medication today.   Orders: -     Basic metabolic panel with GFR; Future  Hypercholesterolemia Assessment & Plan: On crestor . Follow lipid panel and liver function tests. No changes in medication today.  Lab Results  Component Value Date   CHOL 158 07/10/2024   HDL 75.00 07/10/2024   LDLCALC 65 07/10/2024   LDLDIRECT 118.0 10/06/2022    TRIG 91.0 07/10/2024   CHOLHDL 2 07/10/2024     Orders: -     Hepatic function panel; Future -     Lipid panel; Future  Hyperglycemia Assessment & Plan: Follow met b and A1c.  Lab Results  Component Value Date   HGBA1C 5.5 07/13/2024     Orders: -     POCT glycosylated hemoglobin (Hb A1C) -     Basic metabolic panel with GFR; Future -     Hemoglobin A1c; Future  Health care maintenance Assessment & Plan: Physical scheduled for today 07/13/24. Appt changed to f/u.  Saw GI.  cologuard negative.  Mammogram 05/16/24 - Birads I.    Memory change Assessment & Plan: Reported concerns regarding memory change. Discussed further w/up. MMSE 21/30 (but she declined to spell world backwards). Discussed labs. Recent tsh wnl. Can check B12. Wanted to hold on further blood work today. Agreeable to referral to neurology for further evaluation.   Orders: -     Vitamin B12; Future -     Ambulatory referral to Neurology  Need for influenza vaccination -     Flu vaccine HIGH DOSE PF(Fluzone Trivalent)  Aortic atherosclerosis Assessment & Plan: Continue crestor .    Mild intermittent asthma without complication Assessment & Plan:  Had f/u with Dr Tamea 01/18/24 - f/u regarding cough variant asthma. Recommended continuing advair. Albuterol  inhaler if needed. She reports breathing is stable. No increased cough   Atrial fibrillation, unspecified type [facility]) Assessment & Plan: Continue eliquis . Has metoprolol  to take prn. Recent cardiology evaluation 07/03/24 - instructed to take prn. Overall stable. Follow.    Hemochromatosis carrier Assessment & Plan: Follow cbc and iron studies.    Stress Assessment & Plan: Continue zoloft . No change today. Follow.    Thrombophilia Assessment & Plan: Afib. On eliquis .    Other orders -     Losartan  Potassium; Take 1 tablet (100 mg total) by mouth daily.  Dispense: 90 tablet; Refill: 1 -     Rosuvastatin  Calcium ; Take 1 tablet (5 mg  total) by mouth every evening. TAKE 1 TABLET BY MOUTH ONCE EVERY EVENING  Dispense: 90 tablet; Refill: 3     Allena Hamilton, MD

## 2024-07-14 ENCOUNTER — Ambulatory Visit: Payer: Self-pay | Admitting: Internal Medicine

## 2024-07-15 ENCOUNTER — Encounter: Payer: Self-pay | Admitting: Internal Medicine

## 2024-07-15 NOTE — Assessment & Plan Note (Signed)
Afib.  On eliquis.  

## 2024-07-15 NOTE — Assessment & Plan Note (Signed)
 Continue zoloft . No change today. Follow.

## 2024-07-15 NOTE — Assessment & Plan Note (Signed)
 On crestor . Follow lipid panel and liver function tests. No changes in medication today.  Lab Results  Component Value Date   CHOL 158 07/10/2024   HDL 75.00 07/10/2024   LDLCALC 65 07/10/2024   LDLDIRECT 118.0 10/06/2022   TRIG 91.0 07/10/2024   CHOLHDL 2 07/10/2024

## 2024-07-15 NOTE — Assessment & Plan Note (Signed)
 Continue crestor

## 2024-07-15 NOTE — Assessment & Plan Note (Signed)
 Continues on amlodipine  and losartan . Now taking propranolol  prn. Follow pressures. Follow metabolic panel. No change in medication today.

## 2024-07-15 NOTE — Assessment & Plan Note (Signed)
 Reported concerns regarding memory change. Discussed further w/up. MMSE 21/30 (but she declined to spell world backwards). Discussed labs. Recent tsh wnl. Can check B12. Wanted to hold on further blood work today. Agreeable to referral to neurology for further evaluation.

## 2024-07-15 NOTE — Assessment & Plan Note (Signed)
 Continue eliquis . Has metoprolol  to take prn. Recent cardiology evaluation 07/03/24 - instructed to take prn. Overall stable. Follow.

## 2024-07-15 NOTE — Assessment & Plan Note (Signed)
 Follow met b and A1c.  Lab Results  Component Value Date   HGBA1C 5.5 07/13/2024

## 2024-07-15 NOTE — Assessment & Plan Note (Signed)
 Had f/u with Dr Tamea 01/18/24 - f/u regarding cough variant asthma. Recommended continuing advair. Albuterol  inhaler if needed. She reports breathing is stable. No increased cough

## 2024-07-15 NOTE — Assessment & Plan Note (Signed)
 Follow cbc and iron studies.

## 2024-07-19 ENCOUNTER — Ambulatory Visit: Admitting: Pulmonary Disease

## 2024-07-19 ENCOUNTER — Encounter: Payer: Self-pay | Admitting: Pulmonary Disease

## 2024-07-19 VITALS — BP 128/66 | HR 60 | Temp 98.3°F | Ht 66.5 in | Wt 129.6 lb

## 2024-07-19 DIAGNOSIS — J4540 Moderate persistent asthma, uncomplicated: Secondary | ICD-10-CM

## 2024-07-19 LAB — NITRIC OXIDE: Nitric Oxide: 11

## 2024-07-19 NOTE — Patient Instructions (Signed)
 VISIT SUMMARY:  Today, we reviewed your asthma management and discussed the correct usage of your inhaler. We also confirmed that you have received your flu shot.  YOUR PLAN:  -MODERATE PERSISTENT ASTHMA: Moderate persistent asthma is a condition where the airways in your lungs are often inflamed, making it hard to breathe. Your asthma is well-controlled, but we found that you were using your inhaler incorrectly by taking two puffs at a time instead of one puff twice daily. This could lead to taking too much medication and experiencing side effects like palpitations. You should use the Wixela 100/50 inhaler, one puff in the morning and one puff in the evening. We also checked the level of inflammation in your airway and this was well-controlled.   INSTRUCTIONS:  Please schedule a follow-up appointment in four months to assess your asthma control.

## 2024-07-19 NOTE — Progress Notes (Signed)
 Subjective:    Patient ID: Anita Benjamin, female    DOB: 12-17-1933, 89 y.o.   MRN: [id number]  Patient Care Team: Glendia Shad, MD as PCP - General (Internal Medicine) Perla Evalene PARAS, MD as PCP - Cardiology (Cardiology) Dessa Reyes ORN, MD as Consulting Physician (General Surgery) Perla Evalene PARAS, MD as Consulting Physician (Cardiology) Lenn Aran, MD as Referring Physician (Radiation Oncology)  Chief Complaint  Patient presents with   Asthma    No breathing problems.     BACKGROUND/INTERVAL:Patient is an 89 year old lifelong never smoker (albeit with significant secondhand smoke exposure) and a history as noted below, who presents for follow-up on the issue of cough and shortness of breath.  She has been diagnosed with cough variant asthma.  She was last seen here on 18 January 2024.    HPI Discussed the use of AI scribe software for clinical note transcription with the patient, who gave verbal consent to proceed.  History of Present Illness   Anita Benjamin is an 89 year old female with moderate persistent asthma who presents for a follow-up visit.  She uses a 'click' type inhaler and has been taking two puffs at a time, although the instructions indicate one puff twice a day.  This appears to be Wixela a generic of Advair.  She states that she initially started with two puffs based on previous instructions and continued this regimen.  However, all of the instructions with her inhalers have been 1 puff twice a day.  The inhaler was changed to a generic version due to insurance coverage issues, but it contains the same ingredients as the original.  Use of albuterol  as rescue.  She does appear somewhat confused with her medications.  To aid in the discussion I pulled photos of the inhalers on the Internet and showed it to the patient.  She confirms her current inhaler is Wixela.  She does not endorse any fevers, chills or sweats.  No shortness of breath.  Her cough which used to  be a problem has completely resolved.  No other concerns expressed today.  She has received her flu shot.   Overall she feels well and looks well.     DATA 11/20/2021 PFTs: FEV1 1.6 L or 64% predicted, FVC 2.26 L or 85% of predicted, FEV1/FVC 56%, there is significant bronchodilator response with a net change of 14% and FEV1.  Lung volumes were reduced however the test was not reproducible for lung volumes or diffusion capacity.  On the effort independent portion of the curve small airways component is noted.  Diffusion capacity mildly reduced not corrected for hemoglobin.  Overall patient has changes consistent with asthma.  Review of Systems A 10 point review of systems was performed and it is as noted above otherwise negative.   Patient Active Problem List   Diagnosis Date Noted   Memory change 07/13/2024   Nocturia 03/07/2024   Urinary leakage 01/08/2024   Weakness 11/07/2023   Left knee pain 09/06/2023   Thyroid  cyst 07/17/2023   Cold intolerance 03/02/2023   Internal nasal lesion 12/12/2022   Congestion of throat 12/12/2022   Acute cystitis without hematuria 10/26/2022   Asthma 04/12/2022   Hyperglycemia 02/20/2022   Sinusitis, acute, maxillary 01/04/2022   Hypercalcemia 01/04/2022   Right leg swelling 12/16/2021   Right knee pain 12/15/2021   Thrombophilia 09/12/2021   Decreased GFR 09/11/2021   Hemochromatosis, hereditary 04/15/2021   Hemochromatosis carrier 04/05/2021   Aortic atherosclerosis 04/05/2021   Headache 04/03/2021  Atrial fibrillation (HCC) 01/15/2021   Anemia 01/12/2021   Atrial fibrillation with RVR (HCC) 01/01/2021   Hyponatremia 01/01/2021   CAP (community acquired pneumonia) 12/25/2020   Breast cancer (HCC) 06/09/2020   Diarrhea 04/11/2019   Hair loss 06/10/2017   Fatigue 02/19/2017   Chest pain 02/12/2017   Encounter for anticoagulation discussion and counseling 10/05/2016   Cough 10/01/2016   Dysuria 09/17/2015   Left hip pain 07/21/2015    Neck pain 06/09/2015   Syncope 03/25/2015   Left wrist pain 03/25/2015   Abnormal liver function tests 12/05/2014   SOB (shortness of breath) 12/01/2014   Abdominal discomfort 12/01/2014   Atrial flutter (HCC) 11/15/2014   Stress 11/08/2014   Difficulty sleeping 11/08/2014   Health care maintenance 11/08/2014   Adjustment disorder 10/14/2014   Tachycardia 07/17/2014   Hyperkalemia 07/17/2014   History of colonic polyps 03/09/2014   Decreased sense of taste 03/09/2014   Abnormal mammogram 07/31/2013   Abnormal mammogram 01/20/2013   Essential hypertension 10/29/2012   Hypercholesterolemia 10/29/2012   Leukopenia 10/29/2012    Social History   Tobacco Use   Smoking status: Never   Smokeless tobacco: Never  Substance Use Topics   Alcohol use: No    Alcohol/week: 0.0 standard drinks of alcohol    Allergies  Allergen Reactions   Oruvail [Ketoprofen]    Relafen [Nabumetone]    Timentin [Ticarcillin-Pot Clavulanate]     Current Meds  Medication Sig   acetaminophen  (TYLENOL ) 650 MG CR tablet Take 650 mg by mouth every 8 (eight) hours as needed for pain.   Albuterol  Sulfate (PROAIR  RESPICLICK) 108 (90 Base) MCG/ACT AEPB Inhale 2 puffs into the lungs every 6 (six) hours as needed.   amLODipine  (NORVASC ) 5 MG tablet TAKE 1 TABLET BY MOUTH ONCE DAILY AT DINNER TIME AS DIRECTED   apixaban  (ELIQUIS ) 2.5 MG TABS tablet Take 1 tablet (2.5 mg total) by mouth 2 (two) times daily.   Ascorbic Acid  (VITAMIN C) 250 MG CHEW Chew 250 mg by mouth daily.    calcium  citrate-vitamin D  500-500 MG-UNIT chewable tablet Chew 3 tablets by mouth daily.   Cranberry-Vitamin C-Probiotic (AZO CRANBERRY PO) Take 1 tablet by mouth daily.   fluticasone -salmeterol (ADVAIR) 100-50 MCG/ACT AEPB INHALE 1 PUFF INTO THE LUNGS TWICE DAILYRINSE MOUTH AFTER EACH USE (Patient taking differently: Inhale 2 puffs into the lungs 2 (two) times daily.)   letrozole (FEMARA) 2.5 MG tablet Take 2.5 mg by mouth daily.    losartan  (COZAAR ) 100 MG tablet Take 1 tablet (100 mg total) by mouth daily.   Multiple Vitamin (MULTIVITAMIN) tablet Take 1 tablet by mouth daily.   mupirocin  ointment (BACTROBAN ) 2 % Apply 1 Application topically 2 (two) times daily.   ondansetron  (ZOFRAN -ODT) 4 MG disintegrating tablet Take 1 tablet (4 mg total) by mouth every 8 (eight) hours as needed for nausea or vomiting.   Probiotic Product (PROBIOTIC ADVANCED PO) Take 1 capsule by mouth daily.   propranolol  (INDERAL ) 10 MG tablet TAKE 1 TABLET BY MOUTH 3 TIMES DAILY AS NEEDED   rosuvastatin  (CRESTOR ) 5 MG tablet Take 1 tablet (5 mg total) by mouth every evening. TAKE 1 TABLET BY MOUTH ONCE EVERY EVENING   sertraline  (ZOLOFT ) 50 MG tablet TAKE 1 TABLET BY MOUTH ONCE EVERY MORNING    Immunization History  Administered Date(s) Administered   Fluad Quad(high Dose 65+) 06/22/2019, 07/30/2020, 06/12/2021, 07/13/2022   Fluad Trivalent(High Dose 65+) 07/12/2023   INFLUENZA, HIGH DOSE SEASONAL PF 06/10/2017, 09/14/2018, 07/13/2024   Influenza,inj,Quad PF,6+  Mos 06/27/2013, 07/04/2014, 06/05/2015, 05/27/2016   PFIZER(Purple Top)SARS-COV-2 Vaccination 10/31/2019, 11/21/2019   Pneumococcal Conjugate-13 10/21/2015   Pneumococcal Polysaccharide-23 10/06/2017   Tdap 10/27/2012        Objective:     BP 128/66   Pulse 60   Temp 98.3 F (36.8 C) (Oral)   Ht 5' 6.5 (1.689 m)   Wt 129 lb 9.6 oz (58.8 kg)   LMP 10/27/1989   SpO2 98%   BMI 20.60 kg/m   SpO2: 98 %  GENERAL: Thin woman, no acute distress, fully ambulatory.  No conversational dyspnea. HEAD: Normocephalic, atraumatic.  EYES: Pupils equal, round, reactive to light.  No scleral icterus.  MOUTH: Dentition intact, multiple fillings.  Oral mucosa moist.  No thrush. NECK: Supple. No thyromegaly. Trachea midline. No JVD.  No adenopathy. PULMONARY: Good air entry bilaterally.  No adventitious sounds. CARDIOVASCULAR: S1 and S2. Regular rate and rhythm.  No rubs, murmurs or  gallops heard. ABDOMEN: Benign. MUSCULOSKELETAL: Osteoarthritis changes both hands, no clubbing, no edema.  NEUROLOGIC: No focal deficit, no gait disturbance, speech is fluent. SKIN: Intact,warm,dry. PSYCH: Mood and behavior normal.     Lab Results  Component Value Date   NITRICOXIDE 11 07/19/2024  *This result suggests low (<25) Type II (T2) airway inflammation indicating a low likelihood of active T2-driven airway inflammation.  In a patient with active T2 driven asthma management it suggests good control.   Assessment & Plan:     ICD-10-CM   1. Moderate persistent asthma without complication  J45.40 Nitric oxide      Orders Placed This Encounter  Procedures   Nitric oxide    Discussion:    Moderate persistent asthma Moderate persistent asthma is well-controlled. Incorrect inhaler usage was identified, with two puffs taken at a time instead of one puff twice daily, risking excessive medication intake and potential cardiac side effects. The inhaler is a dry powder form, requiring one puff twice daily. She has been educated on correct usage to prevent overmedication and side effects. - Instruct to use Wixela 100/50 inhaler, one puff in the morning and one puff in the evening. - Check the level of inflammation in the airway. - Schedule follow-up appointment in four months to assess asthma control.     Advised if symptoms do not improve or worsen, to please contact office for sooner follow up or seek emergency care.    I spent 22 minutes of dedicated to the care of this patient on the date of this encounter to include pre-visit review of records, face-to-face time with the patient discussing conditions above, post visit ordering of testing, clinical documentation with the electronic health record, making appropriate referrals as documented, and communicating necessary findings to members of the patients care team.     C. Leita Sanders, MD Advanced Bronchoscopy [facility] [place]  [facility]    *This note was generated using voice recognition software/Dragon and/or AI transcription program.  Despite best efforts to proofread, errors can occur which can change the meaning. Any transcriptional errors that result from this process are unintentional and may not be fully corrected at the time of dictation.

## 2024-08-22 NOTE — Telephone Encounter (Signed)
 open in error

## 2024-10-08 ENCOUNTER — Ambulatory Visit: Admitting: Internal Medicine

## 2024-10-08 ENCOUNTER — Encounter: Payer: Self-pay | Admitting: Internal Medicine

## 2024-10-08 ENCOUNTER — Ambulatory Visit: Payer: Self-pay | Admitting: Internal Medicine

## 2024-10-08 VITALS — BP 112/58 | HR 67 | Temp 98.5°F | Ht 66.5 in | Wt 130.6 lb

## 2024-10-08 DIAGNOSIS — I10 Essential (primary) hypertension: Secondary | ICD-10-CM

## 2024-10-08 DIAGNOSIS — R739 Hyperglycemia, unspecified: Secondary | ICD-10-CM | POA: Diagnosis not present

## 2024-10-08 DIAGNOSIS — J4520 Mild intermittent asthma, uncomplicated: Secondary | ICD-10-CM

## 2024-10-08 DIAGNOSIS — C50919 Malignant neoplasm of unspecified site of unspecified female breast: Secondary | ICD-10-CM

## 2024-10-08 DIAGNOSIS — E7800 Pure hypercholesterolemia, unspecified: Secondary | ICD-10-CM | POA: Diagnosis not present

## 2024-10-08 DIAGNOSIS — I4891 Unspecified atrial fibrillation: Secondary | ICD-10-CM | POA: Diagnosis not present

## 2024-10-08 DIAGNOSIS — D6859 Other primary thrombophilia: Secondary | ICD-10-CM

## 2024-10-08 DIAGNOSIS — R300 Dysuria: Secondary | ICD-10-CM

## 2024-10-08 LAB — POC URINALSYSI DIPSTICK (AUTOMATED)
Bilirubin, UA: NEGATIVE
Blood, UA: NEGATIVE
Glucose, UA: NEGATIVE
Ketones, UA: NEGATIVE
Nitrite, UA: NEGATIVE
Protein, UA: NEGATIVE
Spec Grav, UA: 1.01
Urobilinogen, UA: 0.2 U/dL
pH, UA: 6.5

## 2024-10-08 NOTE — Progress Notes (Signed)
 "  Subjective:    Patient ID: Anita Benjamin, female    DOB: 17-Jan-1934, 89 y.o.   MRN: [id number]  Patient here for  Chief Complaint  Patient presents with   Urinary Frequency    HPI Here for a work in appt - work in with concerns regarding a possible UTI - some burning with urination. She reported that on Christmas day she had been standing in the kitchen for a long time. Started feeling a little weak. Noticed feeling a little crazy headed.  Emesis. Sat down - . EMS called - heart rate and blood pressure at that time - ok. States last night - heart rate increased - noticed up to 140. Started feeling a little crazy headed. Sat down and relaxed and was ok. Took propranolol . Symptoms resolved.  Had f/u with cardiology 07/03/24 - reported rare palpitations. Was taking propranolol  q am. Instructed by cardiology to take prn. Conitnue eliquis . Had f/u with Dr Cesar 05/18/24. Stable. Recommended mammogram in one year. Had f/u with Dr Tamea 07/19/24 - f/u asthma. Well controlled. Continue wixela - one puff bid. Overall she feels from a cardiac standpoint - stable. No chest pain. Breathing stable. No abdominal pain or bowel change reported.    Past Medical History:  Diagnosis Date   Anemia    Atrial fibrillation (HCC)    Atrial flutter (HCC)    Fibrocystic breast disease    GERD (gastroesophageal reflux disease)    History of chicken pox    Hypercholesterolemia    Hypertension    Past Surgical History:  Procedure Laterality Date   BREAST BIOPSY Right 2014   NEG   BREAST BIOPSY Right 05/24/2022   stereo bx asymmetry, x marker, benign   BREAST BIOPSY Right 05/25/2022   US  Core BX, Coil Clip - benign   BREAST LUMPECTOMY     BREAST LUMPECTOMY WITH SENTINEL LYMPH NODE BIOPSY Left 06/04/2020   Procedure: BREAST LUMPECTOMY WITH SENTINEL LYMPH NODE BX;  Surgeon: Dessa Reyes ORN, MD;  Location: [facility] ORS;  Service: General;  Laterality: Left;   CATARACT EXTRACTION     DILATION AND CURETTAGE OF  UTERUS     Laser repair of torn retina     Family History  Problem Relation Age of Onset   Heart disease Mother 56       Heart attack   Cancer Father 1       lung cancer   Breast cancer Paternal Aunt        x3   Colon cancer Neg Hx    Social History   Socioeconomic History   Marital status: Widowed    Spouse name: Not on file   Number of children: 2   Years of education: Not on file   Highest education level: Not on file  Occupational History   Not on file  Tobacco Use   Smoking status: Never   Smokeless tobacco: Never  Vaping Use   Vaping status: Never Used  Substance and Sexual Activity   Alcohol use: No    Alcohol/week: 0.0 standard drinks of alcohol   Drug use: No   Sexual activity: Not on file  Other Topics Concern   Not on file  Social History Narrative   Not on file   Social Drivers of Health   Tobacco Use: Low Risk (10/13/2024)   Patient History    Smoking Tobacco Use: Never    Smokeless Tobacco Use: Never    Passive Exposure: Not on file  Financial Resource  Strain: Not on file  Food Insecurity: Not on file  Transportation Needs: Not on file  Physical Activity: Not on file  Stress: Not on file  Social Connections: Not on file  Depression (PHQ2-9): Low Risk (07/13/2024)   Depression (PHQ2-9)    PHQ-2 Score: 0  Alcohol Screen: Not on file  Housing: Unknown (05/15/2024)   [facility]   Epic    Unable to Pay for Housing in the Last Year: Not on file    Number of Times Moved in the Last Year: Not on file    At any time in the past 12 months, were you homeless or living in a shelter (including now)?: No  Utilities: Not on file  Health Literacy: Not on file     Review of Systems  Constitutional:  Negative for appetite change and unexpected weight change.  HENT:  Negative for congestion and sinus pressure.   Respiratory:  Negative for cough, chest tightness and shortness of breath.   Cardiovascular:  Positive for  palpitations. Negative for chest pain and leg swelling.       Intermittent increased heart rate as outlined.   Gastrointestinal:  Negative for abdominal pain, diarrhea and nausea.  Genitourinary:  Negative for difficulty urinating and dysuria.  Musculoskeletal:  Positive for gait problem. Negative for joint swelling and myalgias.  Skin:  Negative for color change and rash.  Neurological:  Negative for dizziness and headaches.  Psychiatric/Behavioral:  Negative for agitation and dysphoric mood.        Objective:     BP (!) 112/58   Pulse 67   Temp 98.5 F (36.9 C) (Oral)   Ht 5' 6.5 (1.689 m)   Wt 130 lb 9.6 oz (59.2 kg)   LMP 10/27/1989   SpO2 96%   BMI 20.76 kg/m  Wt Readings from Last 3 Encounters:  10/08/24 130 lb 9.6 oz (59.2 kg)  07/19/24 129 lb 9.6 oz (58.8 kg)  07/13/24 128 lb 6.4 oz (58.2 kg)    Physical Exam Vitals reviewed.  Constitutional:      General: She is not in acute distress.    Appearance: Normal appearance.  HENT:     Head: Normocephalic and atraumatic.     Right Ear: External ear normal.     Left Ear: External ear normal.     Mouth/Throat:     Pharynx: No oropharyngeal exudate or posterior oropharyngeal erythema.  Eyes:     General: No scleral icterus.       Right eye: No discharge.        Left eye: No discharge.     Conjunctiva/sclera: Conjunctivae normal.  Neck:     Thyroid : No thyromegaly.  Cardiovascular:     Rate and Rhythm: Normal rate and regular rhythm.  Pulmonary:     Effort: No respiratory distress.     Breath sounds: Normal breath sounds. No wheezing.  Abdominal:     General: Bowel sounds are normal.     Palpations: Abdomen is soft.     Tenderness: There is no abdominal tenderness.  Musculoskeletal:        General: No swelling or tenderness.     Cervical back: Neck supple. No tenderness.  Lymphadenopathy:     Cervical: No cervical adenopathy.  Skin:    Findings: No erythema or rash.  Neurological:     Mental Status: She  is alert.  Psychiatric:        Mood and Affect: Mood normal.  Behavior: Behavior normal.         Outpatient Encounter Medications as of 10/08/2024  Medication Sig   acetaminophen  (TYLENOL ) 650 MG CR tablet Take 650 mg by mouth every 8 (eight) hours as needed for pain.   Albuterol  Sulfate (PROAIR  RESPICLICK) 108 (90 Base) MCG/ACT AEPB Inhale 2 puffs into the lungs every 6 (six) hours as needed.   amLODipine  (NORVASC ) 5 MG tablet TAKE 1 TABLET BY MOUTH ONCE DAILY AT DINNER TIME AS DIRECTED   apixaban  (ELIQUIS ) 2.5 MG TABS tablet Take 1 tablet (2.5 mg total) by mouth 2 (two) times daily.   Ascorbic Acid  (VITAMIN C) 250 MG CHEW Chew 250 mg by mouth daily.    calcium  citrate-vitamin D  500-500 MG-UNIT chewable tablet Chew 3 tablets by mouth daily.   Cranberry-Vitamin C-Probiotic (AZO CRANBERRY PO) Take 1 tablet by mouth daily.   fluticasone -salmeterol (ADVAIR) 100-50 MCG/ACT AEPB INHALE 1 PUFF INTO THE LUNGS TWICE DAILYRINSE MOUTH AFTER EACH USE (Patient taking differently: Inhale 2 puffs into the lungs 2 (two) times daily.)   letrozole (FEMARA) 2.5 MG tablet Take 2.5 mg by mouth daily.   losartan  (COZAAR ) 100 MG tablet Take 1 tablet (100 mg total) by mouth daily.   Multiple Vitamin (MULTIVITAMIN) tablet Take 1 tablet by mouth daily.   mupirocin  ointment (BACTROBAN ) 2 % Apply 1 Application topically 2 (two) times daily.   ondansetron  (ZOFRAN -ODT) 4 MG disintegrating tablet Take 1 tablet (4 mg total) by mouth every 8 (eight) hours as needed for nausea or vomiting.   Probiotic Product (PROBIOTIC ADVANCED PO) Take 1 capsule by mouth daily.   propranolol  (INDERAL ) 10 MG tablet TAKE 1 TABLET BY MOUTH 3 TIMES DAILY AS NEEDED   rosuvastatin  (CRESTOR ) 5 MG tablet Take 1 tablet (5 mg total) by mouth every evening. TAKE 1 TABLET BY MOUTH ONCE EVERY EVENING   sertraline  (ZOLOFT ) 50 MG tablet TAKE 1 TABLET BY MOUTH ONCE EVERY MORNING   No facility-administered encounter medications on file as of  10/08/2024.     Lab Results  Component Value Date   WBC 5.6 10/08/2024   HGB 13.6 10/08/2024   HCT 40.0 10/08/2024   PLT 244.0 10/08/2024   GLUCOSE 92 10/08/2024   CHOL 158 07/10/2024   TRIG 91.0 07/10/2024   HDL 75.00 07/10/2024   LDLDIRECT 118.0 10/06/2022   LDLCALC 65 07/10/2024   ALT 15 07/10/2024   AST 22 07/10/2024   NA 139 10/08/2024   K 4.4 10/08/2024   CL 102 10/08/2024   CREATININE 1.02 10/08/2024   BUN 23 10/08/2024   CO2 28 10/08/2024   TSH 3.48 04/04/2024   HGBA1C 5.5 07/13/2024    MM 3D SCREENING MAMMOGRAM BILATERAL BREAST Result Date: 05/16/2024 CLINICAL DATA:  Screening. EXAM: DIGITAL SCREENING BILATERAL MAMMOGRAM WITH TOMOSYNTHESIS AND CAD TECHNIQUE: Bilateral screening digital craniocaudal and mediolateral oblique mammograms were obtained. Bilateral screening digital breast tomosynthesis was performed. The images were evaluated with computer-aided detection. COMPARISON:  Previous exam(s). ACR Breast Density Category b: There are scattered areas of fibroglandular density. FINDINGS: There are no findings suspicious for malignancy. IMPRESSION: No mammographic evidence of malignancy. A result letter of this screening mammogram will be mailed directly to the patient. RECOMMENDATION: Screening mammogram in one year. (Code:[id number]-B-01Y) BI-RADS CATEGORY  1: Negative. Electronically Signed   By: Dina  Arceo M.D.   On: 05/16/2024 05:09       Assessment & Plan:  Dysuria Assessment & Plan: Initially reported some burning with urination. Check urine to confirm no infection. Urine  dip ok. Check culture. Follow.   Orders: -     POCT Urinalysis Dipstick (Automated) -     Urine Microscopic -     Urine Culture  Essential hypertension Assessment & Plan: Continues on amlodipine  and losartan . Now taking propranolol  prn. Follow pressures. Follow metabolic panel. No change in medication today.   Orders: -     Basic metabolic panel with GFR  Atrial fibrillation, unspecified type  (HCC) Assessment & Plan: Continue eliquis . Has propranolol  to take prn.  Recent cardiology evaluation 07/03/24 - instructed to take prn. Had the recent episodes as outlined. Discussed further w/up, including EKG, monitor. She wants to monitor. Seeing cardiology as outlined. Follow.   Orders: -     CBC with Differential/Platelet -     Magnesium   Thrombophilia Assessment & Plan: Afib. On eliquis .    Hyperglycemia Assessment & Plan: Follow met b and A1c.  Lab Results  Component Value Date   HGBA1C 5.5 07/13/2024      Hypercholesterolemia Assessment & Plan: On crestor . Follow lipid panel and liver function tests. No change in medication today.  Lab Results  Component Value Date   CHOL 158 07/10/2024   HDL 75.00 07/10/2024   LDLCALC 65 07/10/2024   LDLDIRECT 118.0 10/06/2022   TRIG 91.0 07/10/2024   CHOLHDL 2 07/10/2024      Hemochromatosis, hereditary Assessment & Plan: Continue to follow iron studies annually.  Saw hematology.     Malignant neoplasm of female breast, unspecified estrogen receptor status, unspecified laterality, unspecified site of breast [facility]) Assessment & Plan:  Had f/u with Dr Cesar 05/18/24. Stable. Recommended mammogram in one year.   Mild intermittent asthma without complication Assessment & Plan: Had f/u with Dr Tamea 07/19/24 - f/u asthma. Well controlled. Continue wixela - one puff bid.       Allena Hamilton, MD "

## 2024-10-09 LAB — CBC WITH DIFFERENTIAL/PLATELET
Basophils Absolute: 0 K/uL (ref 0.0–0.1)
Basophils Relative: 0.3 % (ref 0.0–3.0)
Eosinophils Absolute: 0.2 K/uL (ref 0.0–0.7)
Eosinophils Relative: 3.1 % (ref 0.0–5.0)
HCT: 40 % (ref 36.0–46.0)
Hemoglobin: 13.6 g/dL (ref 12.0–15.0)
Lymphocytes Relative: 22.4 % (ref 12.0–46.0)
Lymphs Abs: 1.2 K/uL (ref 0.7–4.0)
MCHC: 34.1 g/dL (ref 30.0–36.0)
MCV: 95.9 fl (ref 78.0–100.0)
Monocytes Absolute: 0.4 K/uL (ref 0.1–1.0)
Monocytes Relative: 6.7 % (ref 3.0–12.0)
Neutro Abs: 3.8 K/uL (ref 1.4–7.7)
Neutrophils Relative %: 67.5 % (ref 43.0–77.0)
Platelets: 244 K/uL (ref 150.0–400.0)
RBC: 4.18 Mil/uL (ref 3.87–5.11)
RDW: 13.2 % (ref 11.5–15.5)
WBC: 5.6 K/uL (ref 4.0–10.5)

## 2024-10-09 LAB — MAGNESIUM: Magnesium: 2.2 mg/dL (ref 1.5–2.5)

## 2024-10-09 LAB — URINE CULTURE
MICRO NUMBER:: 17425786
Result:: NO GROWTH
SPECIMEN QUALITY:: ADEQUATE

## 2024-10-09 LAB — BASIC METABOLIC PANEL WITH GFR
BUN: 23 mg/dL (ref 6–23)
CO2: 28 meq/L (ref 19–32)
Calcium: 9.6 mg/dL (ref 8.4–10.5)
Chloride: 102 meq/L (ref 96–112)
Creatinine, Ser: 1.02 mg/dL (ref 0.40–1.20)
GFR: 49.09 mL/min — ABNORMAL LOW
Glucose, Bld: 92 mg/dL (ref 70–99)
Potassium: 4.4 meq/L (ref 3.5–5.1)
Sodium: 139 meq/L (ref 135–145)

## 2024-10-09 LAB — URINALYSIS, MICROSCOPIC ONLY: RBC / HPF: NONE SEEN

## 2024-10-10 NOTE — Telephone Encounter (Unsigned)
 Copied from CRM [id number]. Topic: Clinical - Lab/Test Results >> Oct 10, 2024 12:49 PM China J wrote: Reason for CRM: Patient calling for her lab results. She has no further questions.

## 2024-10-13 ENCOUNTER — Encounter: Payer: Self-pay | Admitting: Internal Medicine

## 2024-10-13 NOTE — Assessment & Plan Note (Signed)
 Initially reported some burning with urination. Check urine to confirm no infection. Urine dip ok. Check culture. Follow.

## 2024-10-13 NOTE — Assessment & Plan Note (Signed)
 Continue eliquis . Has propranolol  to take prn.  Recent cardiology evaluation 07/03/24 - instructed to take prn. Had the recent episodes as outlined. Discussed further w/up, including EKG, monitor. She wants to monitor. Seeing cardiology as outlined. Follow.

## 2024-10-13 NOTE — Assessment & Plan Note (Signed)
 On crestor . Follow lipid panel and liver function tests. No change in medication today.  Lab Results  Component Value Date   CHOL 158 07/10/2024   HDL 75.00 07/10/2024   LDLCALC 65 07/10/2024   LDLDIRECT 118.0 10/06/2022   TRIG 91.0 07/10/2024   CHOLHDL 2 07/10/2024

## 2024-10-13 NOTE — Assessment & Plan Note (Signed)
Afib.  On eliquis.  

## 2024-10-13 NOTE — Assessment & Plan Note (Signed)
 Continues on amlodipine  and losartan . Now taking propranolol  prn. Follow pressures. Follow metabolic panel. No change in medication today.

## 2024-10-13 NOTE — Assessment & Plan Note (Signed)
"   Had f/u with Dr Cesar 05/18/24. Stable. Recommended mammogram in one year. "

## 2024-10-13 NOTE — Assessment & Plan Note (Signed)
Continue to follow iron studies annually.  Saw hematology.   ?

## 2024-10-13 NOTE — Assessment & Plan Note (Signed)
 Had f/u with Dr Tamea 07/19/24 - f/u asthma. Well controlled. Continue wixela - one puff bid.

## 2024-10-13 NOTE — Assessment & Plan Note (Signed)
 Follow met b and A1c.  Lab Results  Component Value Date   HGBA1C 5.5 07/13/2024

## 2024-11-08 ENCOUNTER — Other Ambulatory Visit: Payer: Self-pay | Admitting: Internal Medicine

## 2024-11-08 ENCOUNTER — Other Ambulatory Visit: Payer: Self-pay | Admitting: Cardiovascular Disease

## 2024-11-08 DIAGNOSIS — I4891 Unspecified atrial fibrillation: Secondary | ICD-10-CM

## 2024-11-14 ENCOUNTER — Other Ambulatory Visit

## 2024-11-16 ENCOUNTER — Encounter: Admitting: Internal Medicine

## 2024-11-30 ENCOUNTER — Ambulatory Visit: Admitting: Pulmonary Disease
